# Patient Record
Sex: Female | Born: 1935
Health system: Southern US, Community
[De-identification: ages and names within clinical notes are randomized; demographics above are authoritative.]

## PROBLEM LIST (undated history)

## (undated) DIAGNOSIS — R630 Anorexia: Secondary | ICD-10-CM

## (undated) DIAGNOSIS — I10 Essential (primary) hypertension: Secondary | ICD-10-CM

## (undated) DIAGNOSIS — J309 Allergic rhinitis, unspecified: Secondary | ICD-10-CM

## (undated) DIAGNOSIS — I951 Orthostatic hypotension: Secondary | ICD-10-CM

## (undated) DIAGNOSIS — F0150 Vascular dementia without behavioral disturbance: Secondary | ICD-10-CM

## (undated) DIAGNOSIS — N183 Chronic kidney disease, stage 3 (moderate): Principal | ICD-10-CM

## (undated) DIAGNOSIS — Z90710 Acquired absence of both cervix and uterus: Secondary | ICD-10-CM

## (undated) DIAGNOSIS — R413 Other amnesia: Secondary | ICD-10-CM

## (undated) DIAGNOSIS — R569 Unspecified convulsions: Secondary | ICD-10-CM

## (undated) DIAGNOSIS — R269 Unspecified abnormalities of gait and mobility: Secondary | ICD-10-CM

## (undated) DIAGNOSIS — R7302 Impaired glucose tolerance (oral): Secondary | ICD-10-CM

## (undated) DIAGNOSIS — M25551 Pain in right hip: Secondary | ICD-10-CM

## (undated) DIAGNOSIS — K5909 Other constipation: Secondary | ICD-10-CM

## (undated) DIAGNOSIS — M810 Age-related osteoporosis without current pathological fracture: Secondary | ICD-10-CM

## (undated) DIAGNOSIS — E785 Hyperlipidemia, unspecified: Secondary | ICD-10-CM

## (undated) DIAGNOSIS — L309 Dermatitis, unspecified: Secondary | ICD-10-CM

## (undated) DIAGNOSIS — E559 Vitamin D deficiency, unspecified: Secondary | ICD-10-CM

## (undated) DIAGNOSIS — Z8541 Personal history of malignant neoplasm of cervix uteri: Secondary | ICD-10-CM

## (undated) HISTORY — DX: Chronic kidney disease, stage 3 (moderate): N18.3

## (undated) HISTORY — DX: Other constipation: K59.09

## (undated) HISTORY — DX: Other amnesia: R41.3

## (undated) HISTORY — DX: Personal history of malignant neoplasm of cervix uteri: Z85.41

## (undated) HISTORY — DX: Unspecified abnormalities of gait and mobility: R26.9

## (undated) HISTORY — DX: Vascular dementia, unspecified severity, without behavioral disturbance, psychotic disturbance, mood disturbance, and anxiety: F01.50

## (undated) HISTORY — DX: Essential (primary) hypertension: I10

## (undated) HISTORY — DX: Pain in right hip: M25.551

## (undated) HISTORY — DX: Impaired glucose tolerance (oral): R73.02

## (undated) HISTORY — DX: Age-related osteoporosis without current pathological fracture: M81.0

## (undated) HISTORY — DX: Anorexia: R63.0

## (undated) HISTORY — DX: Orthostatic hypotension: I95.1

## (undated) HISTORY — PX: EYE SURGERY: SHX253

## (undated) HISTORY — DX: Hyperlipidemia, unspecified: E78.5

## (undated) HISTORY — DX: Unspecified convulsions: R56.9

## (undated) HISTORY — DX: Allergic rhinitis, unspecified: J30.9

## (undated) HISTORY — DX: Vitamin D deficiency, unspecified: E55.9

## (undated) HISTORY — DX: Dermatitis, unspecified: L30.9

## (undated) HISTORY — DX: Acquired absence of both cervix and uterus: Z90.710

---

## 1968-07-12 HISTORY — PX: ABDOMINAL HYSTERECTOMY: SHX81

## 1979-03-13 HISTORY — PX: BREAST BIOPSY: SHX20

## 2001-01-27 ENCOUNTER — Other Ambulatory Visit: Admission: RE | Admit: 2001-01-27 | Discharge: 2001-01-27 | Payer: Self-pay | Admitting: *Deleted

## 2002-07-24 ENCOUNTER — Other Ambulatory Visit: Admission: RE | Admit: 2002-07-24 | Discharge: 2002-07-24 | Payer: Self-pay | Admitting: *Deleted

## 2002-11-15 ENCOUNTER — Emergency Department (HOSPITAL_COMMUNITY): Admission: EM | Admit: 2002-11-15 | Discharge: 2002-11-16 | Payer: Self-pay | Admitting: *Deleted

## 2002-11-15 ENCOUNTER — Encounter: Payer: Self-pay | Admitting: Emergency Medicine

## 2003-12-14 ENCOUNTER — Ambulatory Visit (HOSPITAL_COMMUNITY): Admission: RE | Admit: 2003-12-14 | Discharge: 2003-12-14 | Payer: Self-pay | Admitting: Internal Medicine

## 2004-12-28 ENCOUNTER — Ambulatory Visit: Payer: Self-pay | Admitting: Internal Medicine

## 2006-05-25 ENCOUNTER — Ambulatory Visit: Payer: Self-pay | Admitting: Internal Medicine

## 2006-06-03 ENCOUNTER — Encounter: Admission: RE | Admit: 2006-06-03 | Discharge: 2006-06-03 | Payer: Self-pay | Admitting: Internal Medicine

## 2010-10-29 ENCOUNTER — Ambulatory Visit (INDEPENDENT_AMBULATORY_CARE_PROVIDER_SITE_OTHER): Payer: Medicare PPO | Admitting: Internal Medicine

## 2010-10-29 ENCOUNTER — Encounter: Payer: Self-pay | Admitting: Internal Medicine

## 2010-10-29 ENCOUNTER — Telehealth: Payer: Self-pay | Admitting: Internal Medicine

## 2010-10-29 ENCOUNTER — Other Ambulatory Visit (INDEPENDENT_AMBULATORY_CARE_PROVIDER_SITE_OTHER): Payer: Medicare PPO

## 2010-10-29 VITALS — BP 148/82 | HR 77 | Temp 97.6°F | Ht 63.0 in | Wt 149.5 lb

## 2010-10-29 DIAGNOSIS — K5900 Constipation, unspecified: Secondary | ICD-10-CM

## 2010-10-29 DIAGNOSIS — J309 Allergic rhinitis, unspecified: Secondary | ICD-10-CM

## 2010-10-29 DIAGNOSIS — Z8541 Personal history of malignant neoplasm of cervix uteri: Secondary | ICD-10-CM | POA: Insufficient documentation

## 2010-10-29 DIAGNOSIS — I10 Essential (primary) hypertension: Secondary | ICD-10-CM

## 2010-10-29 DIAGNOSIS — Z0000 Encounter for general adult medical examination without abnormal findings: Secondary | ICD-10-CM

## 2010-10-29 DIAGNOSIS — K5909 Other constipation: Secondary | ICD-10-CM

## 2010-10-29 DIAGNOSIS — Z90710 Acquired absence of both cervix and uterus: Secondary | ICD-10-CM

## 2010-10-29 DIAGNOSIS — L309 Dermatitis, unspecified: Secondary | ICD-10-CM

## 2010-10-29 DIAGNOSIS — Z23 Encounter for immunization: Secondary | ICD-10-CM

## 2010-10-29 HISTORY — DX: Other constipation: K59.09

## 2010-10-29 HISTORY — DX: Allergic rhinitis, unspecified: J30.9

## 2010-10-29 HISTORY — DX: Personal history of malignant neoplasm of cervix uteri: Z85.41

## 2010-10-29 HISTORY — DX: Dermatitis, unspecified: L30.9

## 2010-10-29 HISTORY — DX: Essential (primary) hypertension: I10

## 2010-10-29 HISTORY — DX: Acquired absence of both cervix and uterus: Z90.710

## 2010-10-29 LAB — HEPATIC FUNCTION PANEL
ALT: 25 U/L (ref 0–35)
Alkaline Phosphatase: 86 U/L (ref 39–117)
Bilirubin, Direct: 0.1 mg/dL (ref 0.0–0.3)
Total Protein: 7.7 g/dL (ref 6.0–8.3)

## 2010-10-29 LAB — URINALYSIS, ROUTINE W REFLEX MICROSCOPIC
Bilirubin Urine: NEGATIVE
Urine Glucose: NEGATIVE
Urobilinogen, UA: 0.2 (ref 0.0–1.0)

## 2010-10-29 LAB — CBC WITH DIFFERENTIAL/PLATELET
Basophils Absolute: 0 10*3/uL (ref 0.0–0.1)
Eosinophils Absolute: 0.1 10*3/uL (ref 0.0–0.7)
Lymphocytes Relative: 40.4 % (ref 12.0–46.0)
MCHC: 34.8 g/dL (ref 30.0–36.0)
Monocytes Absolute: 0.3 10*3/uL (ref 0.1–1.0)
Neutro Abs: 2.8 10*3/uL (ref 1.4–7.7)
Neutrophils Relative %: 52.1 % (ref 43.0–77.0)
RDW: 12.6 % (ref 11.5–14.6)

## 2010-10-29 LAB — BASIC METABOLIC PANEL
CO2: 32 mEq/L (ref 19–32)
Chloride: 100 mEq/L (ref 96–112)
Potassium: 3.2 mEq/L — ABNORMAL LOW (ref 3.5–5.1)
Sodium: 140 mEq/L (ref 135–145)

## 2010-10-29 LAB — LIPID PANEL: Total CHOL/HDL Ratio: 2

## 2010-10-29 MED ORDER — PNEUMOCOCCAL VAC POLYVALENT 25 MCG/0.5ML IJ INJ: 0.5000 mL | INJECTION | Freq: Once | INTRAMUSCULAR | Status: DC

## 2010-10-29 MED ORDER — LISINOPRIL-HYDROCHLOROTHIAZIDE 20-12.5 MG PO TABS: 1.0000 | ORAL_TABLET | Freq: Every day | ORAL | Status: DC

## 2010-10-29 MED ORDER — HYDROCHLOROTHIAZIDE 25 MG PO TABS: 25.0000 mg | ORAL_TABLET | Freq: Every day | ORAL | Status: DC

## 2010-10-29 MED ORDER — AMLODIPINE BESYLATE 10 MG PO TABS: 10.0000 mg | ORAL_TABLET | Freq: Every day | ORAL | Status: DC

## 2010-10-29 MED ORDER — POTASSIUM CHLORIDE 10 MEQ PO TBCR: 10.0000 meq | EXTENDED_RELEASE_TABLET | Freq: Every day | ORAL | Status: DC

## 2010-10-29 MED ORDER — LORATADINE 10 MG PO TABS: 10.0000 mg | ORAL_TABLET | Freq: Every day | ORAL | Status: DC

## 2010-10-29 MED ORDER — ATORVASTATIN CALCIUM 40 MG PO TABS: 40.0000 mg | ORAL_TABLET | Freq: Every day | ORAL | Status: DC

## 2010-10-29 NOTE — Assessment & Plan Note (Signed)
Overall doing well, age appropriate education and counseling updated, referrals for preventative services and immunizations addressed, dietary and smoking counseling addressed, most recent labs and ECG reviewed.  I have personally reviewed and have noted: 1) the patient's medical and social history 2) The pt's use of alcohol, tobacco, and illicit drugs 3) The patient's current medications and supplements 4) Functional ability including ADL's, fall risk, home safety risk, hearing and visual impairment 5) Diet and physical activities 6) Evidence for depression or mood disorder 7) The patient's height, weight, and BMI have been recorded in the chart I have made referrals, and provided counseling and education based on review of the above For pneumonia shot today

## 2010-10-29 NOTE — Progress Notes (Signed)
Subjective:    Patient ID: Teresa Riley, female    DOB: [birth year], 76 y.o.   MRN: [id number]  HPI  Here for wellness and f/u;  Overall doing ok;  Pt denies CP, worsening SOB, DOE, wheezing, orthopnea, PND, worsening LE edema, palpitations, dizziness or syncope.  Pt denies neurological change such as new Headache, facial or extremity weakness.  Pt denies polydipsia, polyuria, or low sugar symptoms. Pt states overall good compliance with treatment and medications, good tolerability, and trying to follow lower cholesterol diet.  Pt denies worsening depressive symptoms, suicidal ideation or panic. No fever, wt loss, night sweats, loss of appetite, or other constitutional symptoms.  Pt states good ability with ADL's, low fall risk, home safety reviewed and adequate, no significant changes in hearing or vision, and occasionally active with exercise.  Has ongoing recurring constipation for over a yr, due for colonscopy but declines.  BP at home < 140/90 Past Medical History  Diagnosis Date  . Hypertension   . HTN (hypertension) 10/29/2010  . H/O: hysterectomy 10/29/2010  . Chronic constipation 10/29/2010  . Eczema 10/29/2010  . History of cervical cancer 10/29/2010   Past Surgical History  Procedure Date  . Abdominal hysterectomy 1970    reports that she has quit smoking. She does not have any smokeless tobacco history on file. She reports that she does not drink alcohol or use illicit drugs. family history includes Hypertension in her father and mother. No Known Allergies No current outpatient prescriptions on file prior to visit.   No current facility-administered medications on file prior to visit.   Review of Systems Review of Systems  Constitutional: Negative for diaphoresis, activity change, appetite change and unexpected weight change.  HENT: Negative for hearing loss, ear pain, facial swelling, mouth sores and neck stiffness.   Eyes: Negative for pain, redness and visual disturbance.    Respiratory: Negative for shortness of breath and wheezing.   Cardiovascular: Negative for chest pain and palpitations.  Gastrointestinal: Negative for diarrhea, blood in stool, abdominal distention and rectal pain.  Genitourinary: Negative for hematuria, flank pain and decreased urine volume.  Musculoskeletal: Negative for myalgias and joint swelling.  Skin: Negative for color change and wound.  Neurological: Negative for syncope and numbness.  Hematological: Negative for adenopathy.  Psychiatric/Behavioral: Negative for hallucinations, self-injury, decreased concentration and agitation.      Objective:   Physical Exam BP 148/82  Pulse 77  Temp(Src) 97.6 F (36.4 C) (Oral)  Ht 5\' 3"  (1.6 m)  Wt 149 lb 8 oz (67.813 kg)  BMI 26.48 kg/m2  SpO2 96% Physical Exam  VS noted Constitutional: Pt is oriented to person, place, and time. Appears well-developed and well-nourished.  HENT:  Head: Normocephalic and atraumatic.  Right Ear: External ear normal.  Left Ear: External ear normal.  Nose: Nose normal.  Mouth/Throat: Oropharynx is clear and moist.  Eyes: Conjunctivae and EOM are normal. Pupils are equal, round, and reactive to light.  Neck: Normal range of motion. Neck supple. No JVD present. No tracheal deviation present.  Cardiovascular: Normal rate, regular rhythm, normal heart sounds and intact distal pulses.   Pulmonary/Chest: Effort normal and breath sounds normal.  Abdominal: Soft. Bowel sounds are normal. There is no tenderness.  Musculoskeletal: Normal range of motion. Exhibits no edema.  Lymphadenopathy:  Has no cervical adenopathy.  Neurological: Pt is alert and oriented to person, place, and time. Pt has normal reflexes. No cranial nerve deficit.  Skin: Skin is warm and dry. No rash noted.  Psychiatric:  Has  normal mood and affect. Behavior is normal. 1+ nervous       Assessment & Plan:

## 2010-10-29 NOTE — Telephone Encounter (Signed)
Pt with mild low K on lab results, likely due to diuretic  Pt to start klor con 10 qd  To robin  - to notify pt, I sent med

## 2010-10-29 NOTE — Patient Instructions (Addendum)
Please try Miralax OTC as needed for constipation or senakot as needed Please go to LAB in the Basement for the blood and/or urine tests to be done today Please call the number on the Blue Card (the PhoneTree System) for results of testing in 2-3 days Please call if you would like to be referred for the colonoscopy Please remember to followup with your GYN for the yearly pap smear and/or mammogram  - please consider [facility] on [address], or [facility] on [address] had the pneumonia shot today All of your refills will be sent to the pharmacy Please return in 1 year for your yearly visit, or sooner if needed, with Lab testing done 3-5 days before

## 2010-10-29 NOTE — Assessment & Plan Note (Signed)
Mild, for miralax OTC prn to start,  Or senakot prn

## 2010-10-30 NOTE — Telephone Encounter (Signed)
Called patient informed of lab results and new prescription sent  To pharmacy.

## 2010-11-02 ENCOUNTER — Telehealth: Payer: Self-pay

## 2010-11-02 MED ORDER — TRIAMCINOLONE ACETONIDE 0.025 % EX LOTN: 0.0250 "application " | TOPICAL_LOTION | Freq: Three times a day (TID) | CUTANEOUS | Status: DC

## 2010-11-02 MED ORDER — POTASSIUM CHLORIDE 10 MEQ PO TBCR: 10.0000 meq | EXTENDED_RELEASE_TABLET | Freq: Every day | ORAL | Status: DC

## 2010-11-02 MED ORDER — ATORVASTATIN CALCIUM 40 MG PO TABS: 40.0000 mg | ORAL_TABLET | Freq: Every day | ORAL | Status: DC

## 2010-11-02 MED ORDER — HYDROCHLOROTHIAZIDE 25 MG PO TABS: 25.0000 mg | ORAL_TABLET | Freq: Every day | ORAL | Status: DC

## 2010-11-02 MED ORDER — LISINOPRIL-HYDROCHLOROTHIAZIDE 20-12.5 MG PO TABS: 1.0000 | ORAL_TABLET | Freq: Every day | ORAL | Status: DC

## 2010-11-02 MED ORDER — AMLODIPINE BESYLATE 10 MG PO TABS: 10.0000 mg | ORAL_TABLET | Freq: Every day | ORAL | Status: DC

## 2010-11-02 MED ORDER — LORATADINE 10 MG PO TABS: 10.0000 mg | ORAL_TABLET | Freq: Every day | ORAL | Status: DC

## 2010-11-02 NOTE — Telephone Encounter (Signed)
Sent prescription requested to [facility] [address].

## 2010-11-02 NOTE — Telephone Encounter (Signed)
To robin   

## 2010-11-02 NOTE — Telephone Encounter (Signed)
[facility] Triage Call Report Triage Record Num: [id number] Operator: Frederico Hamman Patient Name: Teresa Riley Call Date & Time: 10/31/2010 1:23:56PM Patient Phone: [phone] PCP: Oliver Barre Patient Gender: Female PCP Fax : [phone] Patient DOB: 1934/08/30 Practice Name: [facility] Reason for Call: [name] states she was seen in office on 4/18 . Prescriptions for Lisinopril, Amlodipine , HCTZ, Atorvastatin, Loratidine, Potassium and new skin med were to be called in to [facility] [phone]. [name] states no prescriptions several meds were to be called in to pharmacy. [facility] confirms meds not received. [name] states she has enough meds to get through weekend. Advised to notify office on 4/23/AM. Care advice given. office note Protocol(s) Used: Office Note Recommended Outcome per Protocol: Information Noted and Sent to Office Reason for Outcome: Caller information to office Care Advice: ~ 10/31/2010 1:40:54PM Page 1 of 1 [email or web address]

## 2010-11-08 ENCOUNTER — Encounter: Payer: Self-pay | Admitting: Internal Medicine

## 2010-11-08 NOTE — Assessment & Plan Note (Signed)
stable overall by hx and exam, most recent lab reviewed with pt, and pt to continue medical treatment as before  BP Readings from Last 3 Encounters:  10/29/10 148/82

## 2010-11-10 ENCOUNTER — Encounter: Payer: Self-pay | Admitting: Internal Medicine

## 2010-12-01 ENCOUNTER — Ambulatory Visit: Payer: Medicare PPO

## 2011-01-10 HISTORY — PX: CATARACT EXTRACTION W/ INTRAOCULAR LENS  IMPLANT, BILATERAL: SHX1307

## 2011-01-15 ENCOUNTER — Ambulatory Visit: Payer: Medicare PPO | Admitting: Internal Medicine

## 2011-01-19 ENCOUNTER — Encounter: Payer: Self-pay | Admitting: Internal Medicine

## 2011-01-19 ENCOUNTER — Ambulatory Visit (INDEPENDENT_AMBULATORY_CARE_PROVIDER_SITE_OTHER): Payer: Medicare PPO | Admitting: Internal Medicine

## 2011-01-19 VITALS — BP 142/86 | HR 73 | Temp 99.0°F | Ht 60.0 in | Wt 148.2 lb

## 2011-01-19 DIAGNOSIS — I10 Essential (primary) hypertension: Secondary | ICD-10-CM

## 2011-01-19 DIAGNOSIS — Z111 Encounter for screening for respiratory tuberculosis: Secondary | ICD-10-CM

## 2011-01-19 DIAGNOSIS — Z0289 Encounter for other administrative examinations: Secondary | ICD-10-CM

## 2011-01-19 DIAGNOSIS — J309 Allergic rhinitis, unspecified: Secondary | ICD-10-CM

## 2011-01-19 DIAGNOSIS — Z0282 Encounter for adoption services: Secondary | ICD-10-CM

## 2011-01-19 NOTE — Assessment & Plan Note (Addendum)
Overall doing well, age appropriate education and counseling updated, referrals for preventative services and immunizations addressed, dietary and smoking counseling addressed, most recent labs  reviewed.  I have personally reviewed and have noted: 1) the patient's medical and social history 2) The pt's use of alcohol, tobacco, and illicit drugs 3) The patient's current medications and supplements 4) Functional ability including ADL's, fall risk, home safety risk, hearing and visual impairment 5) Diet and physical activities 6) Evidence for depression or mood disorder 7) The patient's height, weight, and BMI have been recorded in the chart I have made referrals, and provided counseling and education based on review of the above Pt is able to qualify to become foster parent. PPD placed today, f/u 48-72 hrs.

## 2011-01-19 NOTE — Patient Instructions (Addendum)
Please return in 48-72 hours to have the TB skin test read by the nurse Your form is partially filled out for the [facility] Parent application The form can be completed after the TB skin test is read Continue all other medications as before

## 2011-01-19 NOTE — Assessment & Plan Note (Signed)
stable overall by hx and exam, most recent data reviewed with pt, and pt to continue medical treatment as before  BP Readings from Last 3 Encounters:  01/19/11 142/86  10/29/10 148/82

## 2011-01-19 NOTE — Assessment & Plan Note (Signed)
stable overall by hx and exam, most recent data reviewed with pt, and pt to continue medical treatment as before  For allegra otc prn

## 2011-01-19 NOTE — Progress Notes (Signed)
Subjective:    Patient ID: Teresa Riley, female    DOB: [birth year], 76 y.o.   MRN: [id number]  HPI  Here to f/u;  Needs PPD and form filled out in order to become foster parent; Pt denies chest pain, increased sob or doe, wheezing, orthopnea, PND, increased LE swelling, palpitations, dizziness or syncope.  Pt denies new neurological symptoms such as new headache, or facial or extremity weakness or numbness   Pt denies polydipsia, polyuria, .  Pt states overall good compliance with meds, trying to follow lower cholesterol diet, wt overall stable but little exercise however.   Does have several wks ongoing nasal allergy symptoms with clear congestion, itch and sneeze, without fever, pain, ST, cough or wheezing.  Overall good compliance with treatment, and good medicine tolerability.   Pt denies fever, wt loss, night sweats, loss of appetite, or other constitutional symptoms.  Denies worsening depressive symptoms, suicidal ideation, or panic. Past Medical History  Diagnosis Date  . Hypertension   . HTN (hypertension) 10/29/2010  . H/O: hysterectomy 10/29/2010  . Chronic constipation 10/29/2010  . Eczema 10/29/2010  . History of cervical cancer 10/29/2010  . Allergic rhinitis, cause unspecified 10/29/2010   Past Surgical History  Procedure Date  . Abdominal hysterectomy 1970    reports that she has quit smoking. She does not have any smokeless tobacco history on file. She reports that she does not drink alcohol or use illicit drugs. family history includes Hypertension in her father and mother. No Known Allergies Current Outpatient Prescriptions on File Prior to Visit  Medication Sig Dispense Refill  . amLODipine (NORVASC) 10 MG tablet Take 1 tablet (10 mg total) by mouth daily.  90 tablet  3  . atorvastatin (LIPITOR) 40 MG tablet Take 1 tablet (40 mg total) by mouth at bedtime.  90 tablet  3  . hydrochlorothiazide 25 MG tablet Take 1 tablet (25 mg total) by mouth daily.  90 tablet  3  .  lisinopril-hydrochlorothiazide (PRINZIDE,ZESTORETIC) 20-12.5 MG per tablet Take 1 tablet by mouth daily.  90 tablet  3  . Triamcinolone Acetonide 0.025 % LOTN Apply 0.025 application topically 3 (three) times daily. Apply three times per day for Eczema  1 Bottle  11  . potassium chloride (KLOR-CON 10) 10 MEQ CR tablet Take 1 tablet (10 mEq total) by mouth daily.  30 tablet  11  . DISCONTD: camphor-menthol (SARNA) lotion Apply topically. Use up to 6 times per day       . DISCONTD: loratadine (CLARITIN) 10 MG tablet Take 1 tablet (10 mg total) by mouth daily.  90 tablet  3   Current Facility-Administered Medications on File Prior to Visit  Medication Dose Route Frequency Provider Last Rate Last Dose  . pneumococcal 23 valent vaccine (PNU-IMMUNE) injection 0.5 mL  0.5 mL Intramuscular Once Oliver Barre, MD       Review of Systems Review of Systems  Constitutional: Negative for diaphoresis and unexpected weight change.  HENT: Negative for drooling and tinnitus.   Eyes: Negative for photophobia and visual disturbance.  Respiratory: Negative for choking and stridor.   Gastrointestinal: Negative for vomiting and blood in stool.  Genitourinary: Negative for hematuria and decreased urine volume.  Musculoskeletal: Negative for gait problem.  Skin: Negative for color change and wound.  Neurological: Negative for tremors and numbness.  Psychiatric/Behavioral: Negative for decreased concentration. The patient is not hyperactive.       Objective:   Physical Exam BP 142/86  Pulse 73  Temp(Src)  99 F (37.2 C) (Oral)  Ht 5' (1.524 m)  Wt 148 lb 4 oz (67.246 kg)  BMI 28.95 kg/m2  SpO2 96% Physical Exam  VS noted Constitutional: Pt appears well-developed and well-nourished.  HENT: Head: Normocephalic.  Right Ear: External ear normal.  Left Ear: External ear normal.  Eyes: Conjunctivae and EOM are normal. Pupils are equal, round, and reactive to light.  Neck: Normal range of motion. Neck supple.    Cardiovascular: Normal rate and regular rhythm.   Pulmonary/Chest: Effort normal and breath sounds normal.  Abd:  Soft, NT, non-distended, + BS Neurological: Pt is alert. No cranial nerve deficit.  Skin: Skin is warm. No erythema.  Psychiatric: Pt behavior is normal. Thought content normal.         Assessment & Plan:

## 2011-01-20 ENCOUNTER — Ambulatory Visit: Payer: Medicare PPO | Admitting: Internal Medicine

## 2011-01-21 LAB — TB SKIN TEST

## 2011-06-11 ENCOUNTER — Other Ambulatory Visit (INDEPENDENT_AMBULATORY_CARE_PROVIDER_SITE_OTHER): Payer: Medicare PPO

## 2011-06-11 ENCOUNTER — Ambulatory Visit (INDEPENDENT_AMBULATORY_CARE_PROVIDER_SITE_OTHER)
Admission: RE | Admit: 2011-06-11 | Discharge: 2011-06-11 | Disposition: A | Discharge status: Home / Self Care | Payer: Medicare PPO | Source: Ambulatory Visit | Attending: Internal Medicine | Admitting: Internal Medicine

## 2011-06-11 ENCOUNTER — Ambulatory Visit (INDEPENDENT_AMBULATORY_CARE_PROVIDER_SITE_OTHER): Payer: Medicare PPO | Admitting: Internal Medicine

## 2011-06-11 ENCOUNTER — Encounter: Payer: Self-pay | Admitting: Internal Medicine

## 2011-06-11 VITALS — BP 142/92 | HR 77 | Temp 98.8°F | Ht 63.5 in | Wt 149.2 lb

## 2011-06-11 DIAGNOSIS — R55 Syncope and collapse: Secondary | ICD-10-CM

## 2011-06-11 DIAGNOSIS — I10 Essential (primary) hypertension: Secondary | ICD-10-CM

## 2011-06-11 LAB — BASIC METABOLIC PANEL
BUN: 12 mg/dL (ref 6–23)
Chloride: 98 mEq/L (ref 96–112)
Creatinine, Ser: 0.9 mg/dL (ref 0.4–1.2)
Glucose, Bld: 109 mg/dL — ABNORMAL HIGH (ref 70–99)
Potassium: 3.4 mEq/L — ABNORMAL LOW (ref 3.5–5.1)

## 2011-06-11 LAB — CBC WITH DIFFERENTIAL/PLATELET
Basophils Relative: 1.4 % (ref 0.0–3.0)
Eosinophils Relative: 0.2 % (ref 0.0–5.0)
Lymphocytes Relative: 29.5 % (ref 12.0–46.0)
Monocytes Absolute: 0.2 10*3/uL (ref 0.1–1.0)
Monocytes Relative: 4.1 % (ref 3.0–12.0)
Neutrophils Relative %: 64.8 % (ref 43.0–77.0)
Platelets: 264 10*3/uL (ref 150.0–400.0)
RBC: 4.16 Mil/uL (ref 3.87–5.11)
WBC: 5.8 10*3/uL (ref 4.5–10.5)

## 2011-06-11 LAB — URINALYSIS, ROUTINE W REFLEX MICROSCOPIC
Nitrite: NEGATIVE
Specific Gravity, Urine: 1.01 (ref 1.000–1.030)
Total Protein, Urine: NEGATIVE
pH: 7 (ref 5.0–8.0)

## 2011-06-11 LAB — HEPATIC FUNCTION PANEL
ALT: 16 U/L (ref 0–35)
AST: 35 U/L (ref 0–37)
Albumin: 4.2 g/dL (ref 3.5–5.2)
Total Protein: 8.2 g/dL (ref 6.0–8.3)

## 2011-06-11 MED ORDER — LISINOPRIL 20 MG PO TABS: 20.0000 mg | ORAL_TABLET | Freq: Every day | ORAL | Status: DC

## 2011-06-11 NOTE — Progress Notes (Signed)
Subjective:    Patient ID: Teresa Riley, female    DOB: May 02, 1935, 76 y.o.   MRN: [id number]  HPI  Here to f/u;  Here with one daughter, but was at a second daughters house in [place], had been going up and down the stairs in the house about 8 times for approx 30 stairs each way (did this about 3 times more than usual, and holding light wts in the arms at the same time);  Was finished and was standing in the laundry room putting clothes in the top part of washer without bending at the wiast - dirty linens were at waist level;  Became lightheaded, fuzzy and confused, thought it would pass, did not try to sit and passed out;  Pt denies chest pain, increased sob or doe, wheezing, orthopnea, PND, increased LE swelling, palpitations.   Pt denies new neurological symptoms such as new headache, or facial or extremity weakness or numbness.  Pt denies polydipsia, polyuria. Family heard her hit the floor and was with her within less than one minute and noted pt tyring to get back up, became dizzy and weak and has seocnd episode of syncope - out for approx 5 min. Family called EMS, noted eyes open with some slight twitching of the hands and legs, mouth slightly dropped open, but overt sz activity. "came to" after about 5 min and EMS did onsite EKG (pt brings with her) but o/w refused to go ER.  CAme here instead, still somewhat fatigue, tired, drained, weak, still mild confused more than usual per duaghter with her today. Pt denies HA now, but first fall not witnessed.   Pt denies fever, wt loss, night sweats, loss of appetite, or other constitutional symptoms.   Denies urinary symptoms such as dysuria, frequency, urgency,or hematuria.   Past Medical History  Diagnosis Date  . Hypertension   . HTN (hypertension) 10/29/2010  . H/O: hysterectomy 10/29/2010  . Chronic constipation 10/29/2010  . Eczema 10/29/2010  . History of cervical cancer 10/29/2010  . Allergic rhinitis, cause unspecified 10/29/2010   Past  Surgical History  Procedure Date  . Abdominal hysterectomy 1970    reports that she has quit smoking. She does not have any smokeless tobacco history on file. She reports that she does not drink alcohol or use illicit drugs. family history includes Hypertension in her father and mother. No Known Allergies Current Outpatient Prescriptions on File Prior to Visit  Medication Sig Dispense Refill  . amLODipine (NORVASC) 10 MG tablet Take 1 tablet (10 mg total) by mouth daily.  90 tablet  3  . atorvastatin (LIPITOR) 40 MG tablet Take 1 tablet (40 mg total) by mouth at bedtime.  90 tablet  3  . potassium chloride (KLOR-CON 10) 10 MEQ CR tablet Take 1 tablet (10 mEq total) by mouth daily.  30 tablet  11   Current Facility-Administered Medications on File Prior to Visit  Medication Dose Route Frequency Provider Last Rate Last Dose  . pneumococcal 23 valent vaccine (PNU-IMMUNE) injection 0.5 mL  0.5 mL Intramuscular Once Oliver Barre, MD       Review of Systems Theodosia Paling of Systems  Constitutional: Negative for diaphoresis and unexpected weight change.  HENT: Negative for drooling and tinnitus.   Eyes: Negative for photophobia and visual disturbance.  Respiratory: Negative for choking and stridor.   Gastrointestinal: Negative for vomiting and blood in stool.  Genitourinary: Negative for hematuria and decreased urine volume.  Musculoskeletal: Negative for gait problem.  Skin: Negative  for color change and wound.  Neurological: Negative for tremors and numbness.  Psychiatric/Behavioral: Negative for decreased concentration. The patient is not hyperactive.       Objective:   Physical Exam BP 142/92  Pulse 77  Temp(Src) 98.8 F (37.1 C) (Oral)  Ht 5' 3.5" (1.613 m)  Wt 149 lb 4 oz (67.699 kg)  BMI 26.02 kg/m2  SpO2 96% Physical Exam  VS noted Constitutional: Pt appears well-developed and well-nourished.  HENT: Head: Normocephalic.  Right Ear: External ear normal.  Left Ear: External ear  normal.  Eyes: Conjunctivae and EOM are normal. Pupils are equal, round, and reactive to light.  Neck: Normal range of motion. Neck supple.  Cardiovascular: Normal rate and regular rhythm.   Pulmonary/Chest: Effort normal and breath sounds normal.  Abd:  Soft, NT, non-distended, + BS Neurological: Pt is alert. No cranial nerve deficit. minor cognitive slowing, confusion noted, motor/sens/dtr/gait intact Skin: Skin is warm. No erythema.  Psychiatric: Pt behavior is normal. Thought content normal.     Assessment & Plan:

## 2011-06-11 NOTE — Assessment & Plan Note (Signed)
To d/c HCT, to cont lisinopril only,  to f/u any worsening symptoms or concerns with orthostatics next visit

## 2011-06-11 NOTE — Patient Instructions (Addendum)
OK to stop the hydrochlorothiazide 25 mg OK to stop the lisinopril-HCT 20-12.5 mg Please start lisinopril 20 mg per day OK to drink a bit more fluids in the next 2-3 days Continue all other medications as before Please go to XRAY in the Basement for the x-ray test Please go to LAB in the Basement for the blood and/or urine tests to be done today Please call the phone number [phone] (the PhoneTree System) for results of testing in 2-3 days;  When calling, simply dial the number, and when prompted enter the MRN number above (the Medical Record Number) and the # key, then the message should start. You will be contacted regarding the referral for: echocardiogram, and carotid dopplers You will be contacted regarding the referral for: CT Head - to see PCC's now to schedule hopefully today

## 2011-06-11 NOTE — Assessment & Plan Note (Signed)
Most likely due to mild orthostasis ongoing with overdiuresis, then overexertion today; cant r/o injury to head with mild confusion though pt denies HA;  For ct head - r/o bleed;  Also for labs including cpk/mb/labs/cxr/carotid dopplers and echo (many yrs since last);  Consider card eval for abnormality;  Pt adamant she does not want to go to ER or hospital admit

## 2011-06-12 LAB — CK TOTAL AND CKMB (NOT AT ARMC)
Relative Index: 1.7 (ref 0.0–2.5)
Total CK: 194 U/L — ABNORMAL HIGH (ref 7–177)

## 2011-06-14 ENCOUNTER — Inpatient Hospital Stay: Admission: RE | Admit: 2011-06-14 | Payer: Medicare PPO | Source: Ambulatory Visit

## 2011-06-15 ENCOUNTER — Other Ambulatory Visit: Payer: Self-pay

## 2011-06-15 ENCOUNTER — Other Ambulatory Visit: Payer: Self-pay | Admitting: Cardiology

## 2011-06-15 DIAGNOSIS — R55 Syncope and collapse: Secondary | ICD-10-CM

## 2011-06-15 MED ORDER — LISINOPRIL 20 MG PO TABS: 20.0000 mg | ORAL_TABLET | Freq: Every day | ORAL | Status: DC

## 2011-06-15 NOTE — Telephone Encounter (Signed)
Patient called to informed she never received Lisinopril sent in on 06/11/2011. Called gave verbal ok to fill lisinopril as was on hold at pharmacy. Also per request by [phone]

## 2011-06-16 ENCOUNTER — Ambulatory Visit (HOSPITAL_COMMUNITY): Payer: Medicare PPO | Attending: Internal Medicine | Admitting: Radiology

## 2011-06-16 ENCOUNTER — Encounter (INDEPENDENT_AMBULATORY_CARE_PROVIDER_SITE_OTHER): Payer: Medicare PPO | Admitting: Cardiology

## 2011-06-16 DIAGNOSIS — R55 Syncope and collapse: Secondary | ICD-10-CM

## 2011-06-16 DIAGNOSIS — I079 Rheumatic tricuspid valve disease, unspecified: Secondary | ICD-10-CM | POA: Insufficient documentation

## 2011-06-16 DIAGNOSIS — I10 Essential (primary) hypertension: Secondary | ICD-10-CM | POA: Insufficient documentation

## 2011-06-25 ENCOUNTER — Encounter: Payer: Self-pay | Admitting: Internal Medicine

## 2011-06-25 ENCOUNTER — Ambulatory Visit (INDEPENDENT_AMBULATORY_CARE_PROVIDER_SITE_OTHER): Payer: Medicare PPO | Admitting: Internal Medicine

## 2011-06-25 VITALS — BP 146/90 | HR 84 | Temp 97.6°F | Ht 62.0 in | Wt 148.0 lb

## 2011-06-25 DIAGNOSIS — R55 Syncope and collapse: Secondary | ICD-10-CM

## 2011-06-25 DIAGNOSIS — I10 Essential (primary) hypertension: Secondary | ICD-10-CM

## 2011-06-25 DIAGNOSIS — Z23 Encounter for immunization: Secondary | ICD-10-CM

## 2011-06-25 DIAGNOSIS — Z0000 Encounter for general adult medical examination without abnormal findings: Secondary | ICD-10-CM

## 2011-06-25 NOTE — Patient Instructions (Signed)
You had the flu shot today Continue all other medications as before Please continue to monitor your Blood Pressure at home; your goal is to be less than 140/90 Please return in 1 year for your yearly visit, or sooner if needed, with Lab testing done 3-5 days before

## 2011-06-26 ENCOUNTER — Encounter: Payer: Self-pay | Admitting: Internal Medicine

## 2011-06-26 NOTE — Assessment & Plan Note (Signed)
Labs/ck/echo/carotids d/w pt/see results on chart without significant abnormality; no further symptoms,  to f/u any worsening symptoms or concerns

## 2011-06-26 NOTE — Assessment & Plan Note (Signed)
stable overall by hx and exam, most recent data reviewed with pt, and pt to continue medical treatment as before, has somewhat mild elev BP but pt declines further med change at this time  BP Readings from Last 3 Encounters:  06/25/11 146/90  06/11/11 142/92  01/19/11 142/86

## 2011-06-26 NOTE — Progress Notes (Signed)
  Subjective:    Patient ID: Teresa Riley, female    DOB: 1935/02/21, 76 y.o.   MRN: [id number]  HPI  Here to f/u; overall doing quite nicely, with no significant orthostasis or syncope since last seen with med changes and increase po fluids.  Pt denies chest pain, increased sob or doe, wheezing, orthopnea, PND, increased LE swelling, palpitations, dizziness or syncope.  Pt denies new neurological symptoms such as new headache, or facial or extremity weakness or numbness   Pt denies polydipsia, polyuria, or low sugar symptoms such as weakness or confusion improved with po intake.  Pt states overall good compliance with meds as is.   Pt denies fever, wt loss, night sweats, loss of appetite, or other constitutional symptoms Past Medical History  Diagnosis Date  . Hypertension   . HTN (hypertension) 10/29/2010  . H/O: hysterectomy 10/29/2010  . Chronic constipation 10/29/2010  . Eczema 10/29/2010  . History of cervical cancer 10/29/2010  . Allergic rhinitis, cause unspecified 10/29/2010   Past Surgical History  Procedure Date  . Abdominal hysterectomy 1970    reports that she has quit smoking. She does not have any smokeless tobacco history on file. She reports that she does not drink alcohol or use illicit drugs. family history includes Hypertension in her father and mother. No Known Allergies Current Outpatient Prescriptions on File Prior to Visit  Medication Sig Dispense Refill  . amLODipine (NORVASC) 10 MG tablet Take 1 tablet (10 mg total) by mouth daily.  90 tablet  3  . atorvastatin (LIPITOR) 40 MG tablet Take 1 tablet (40 mg total) by mouth at bedtime.  90 tablet  3  . lisinopril (PRINIVIL,ZESTRIL) 20 MG tablet Take 1 tablet (20 mg total) by mouth daily.  90 tablet  3  . potassium chloride (KLOR-CON 10) 10 MEQ CR tablet Take 1 tablet (10 mEq total) by mouth daily.  30 tablet  11   Current Facility-Administered Medications on File Prior to Visit  Medication Dose Route Frequency Provider  Last Rate Last Dose  . pneumococcal 23 valent vaccine (PNU-IMMUNE) injection 0.5 mL  0.5 mL Intramuscular Once Oliver Barre, MD        Review of Systems Review of Systems  Constitutional: Negative for diaphoresis and unexpected weight change.  HENT: Negative for drooling and tinnitus.   Eyes: Negative for photophobia and visual disturbance.  Respiratory: Negative for choking and stridor.   Gastrointestinal: Negative for vomiting and blood in stool.  Genitourinary: Negative for hematuria and decreased urine volume.  .      Objective:   Physical Exam BP 146/90  Pulse 84  Temp(Src) 97.6 F (36.4 C) (Oral)  Ht 5\' 2"  (1.575 m)  Wt 148 lb (67.132 kg)  BMI 27.07 kg/m2  SpO2 98% Physical Exam  VS noted Constitutional: Pt appears well-developed and well-nourished.  HENT: Head: Normocephalic.  Right Ear: External ear normal.  Left Ear: External ear normal.  Eyes: Conjunctivae and EOM are normal. Pupils are equal, round, and reactive to light.  Neck: Normal range of motion. Neck supple.  Cardiovascular: Normal rate and regular rhythm.   Pulmonary/Chest: Effort normal and breath sounds normal.  Abd:  Soft, NT, non-distended, + BS Neurological: Pt is alert. No cranial nerve deficit.  Skin: Skin is warm. No erythema.  Psychiatric: Pt behavior is normal. Thought content normal.     Assessment & Plan:

## 2011-10-01 ENCOUNTER — Other Ambulatory Visit: Payer: Self-pay

## 2011-10-01 MED ORDER — AMLODIPINE BESYLATE 10 MG PO TABS: 10.0000 mg | ORAL_TABLET | Freq: Every day | ORAL | Status: DC

## 2011-11-03 ENCOUNTER — Encounter: Payer: Self-pay | Admitting: Internal Medicine

## 2011-11-03 ENCOUNTER — Ambulatory Visit (INDEPENDENT_AMBULATORY_CARE_PROVIDER_SITE_OTHER): Payer: Medicare PPO | Admitting: Internal Medicine

## 2011-11-03 ENCOUNTER — Other Ambulatory Visit (INDEPENDENT_AMBULATORY_CARE_PROVIDER_SITE_OTHER): Payer: Medicare PPO

## 2011-11-03 VITALS — BP 144/90 | HR 79 | Temp 97.2°F | Ht 62.5 in | Wt 148.1 lb

## 2011-11-03 DIAGNOSIS — Z0000 Encounter for general adult medical examination without abnormal findings: Secondary | ICD-10-CM

## 2011-11-03 DIAGNOSIS — I10 Essential (primary) hypertension: Secondary | ICD-10-CM

## 2011-11-03 DIAGNOSIS — H9190 Unspecified hearing loss, unspecified ear: Secondary | ICD-10-CM

## 2011-11-03 DIAGNOSIS — R55 Syncope and collapse: Secondary | ICD-10-CM

## 2011-11-03 DIAGNOSIS — H9191 Unspecified hearing loss, right ear: Secondary | ICD-10-CM | POA: Insufficient documentation

## 2011-11-03 LAB — LIPID PANEL
Cholesterol: 282 mg/dL — ABNORMAL HIGH (ref 0–200)
HDL: 85.7 mg/dL (ref >39.00)
Total CHOL/HDL Ratio: 3
VLDL: 19.4 mg/dL (ref 0.0–40.0)

## 2011-11-03 LAB — HEPATIC FUNCTION PANEL
Bilirubin, Direct: 0.1 mg/dL (ref 0.0–0.3)
Total Bilirubin: 0.7 mg/dL (ref 0.3–1.2)

## 2011-11-03 LAB — CBC WITH DIFFERENTIAL/PLATELET
Basophils Absolute: 0 10*3/uL (ref 0.0–0.1)
Eosinophils Relative: 0.8 % (ref 0.0–5.0)
HCT: 41 % (ref 36.0–46.0)
Hemoglobin: 13.8 g/dL (ref 12.0–15.0)
MCHC: 33.5 g/dL (ref 30.0–36.0)
MCV: 94.8 fl (ref 78.0–100.0)
RBC: 4.33 Mil/uL (ref 3.87–5.11)
WBC: 5.2 10*3/uL (ref 4.5–10.5)

## 2011-11-03 LAB — URINALYSIS, ROUTINE W REFLEX MICROSCOPIC
Ketones, ur: NEGATIVE
Leukocytes, UA: NEGATIVE
Nitrite: NEGATIVE
Specific Gravity, Urine: 1.01 (ref 1.000–1.030)
pH: 7.5 (ref 5.0–8.0)

## 2011-11-03 LAB — LDL CHOLESTEROL, DIRECT: Direct LDL: 184.2 mg/dL

## 2011-11-03 LAB — BASIC METABOLIC PANEL
BUN: 11 mg/dL (ref 6–23)
Chloride: 102 mEq/L (ref 96–112)
Creatinine, Ser: 0.8 mg/dL (ref 0.4–1.2)
GFR: 87.44 mL/min (ref >60.00)

## 2011-11-03 LAB — TSH: TSH: 1.19 u[IU]/mL (ref 0.35–5.50)

## 2011-11-03 MED ORDER — AMLODIPINE BESYLATE 10 MG PO TABS: 10.0000 mg | ORAL_TABLET | Freq: Every day | ORAL | Status: DC

## 2011-11-03 MED ORDER — LISINOPRIL 20 MG PO TABS: 20.0000 mg | ORAL_TABLET | Freq: Every day | ORAL | Status: DC

## 2011-11-03 MED ORDER — ASPIRIN 81 MG PO TBEC: 81.0000 mg | DELAYED_RELEASE_TABLET | Freq: Every day | ORAL | Status: DC

## 2011-11-03 MED ORDER — ATORVASTATIN CALCIUM 40 MG PO TABS: 40.0000 mg | ORAL_TABLET | Freq: Every day | ORAL | Status: DC

## 2011-11-03 NOTE — Patient Instructions (Addendum)
Continue all other medications as before Your refills were done today to [facility] You will be contacted regarding the referral for: colonoscopy Your right ear was irrigated of wax today Please go to LAB in the Basement for the blood and/or urine tests to be done today You will be contacted by phone if any changes need to be made immediately.  Otherwise, you will receive a letter about your results with an explanation. Please return in 1 year for your yearly visit, or sooner if needed, with Lab testing done 3-5 days before

## 2011-11-03 NOTE — Assessment & Plan Note (Signed)
Overall doing well, age appropriate education and counseling updated, referrals for preventative services and immunizations addressed, dietary and smoking counseling addressed, most recent labs and ECG reviewed.  I have personally reviewed and have noted: 1) the patient's medical and social history 2) The pt's use of alcohol, tobacco, and illicit drugs 3) The patient's current medications and supplements 4) Functional ability including ADL's, fall risk, home safety risk, hearing and visual impairment 5) Diet and physical activities 6) Evidence for depression or mood disorder 7) The patient's height, weight, and BMI have been recorded in the chart I have made referrals, and provided counseling and education based on review of the above For labs today, and due for colonoscopy

## 2011-11-03 NOTE — Progress Notes (Signed)
Subjective:    Patient ID: Teresa Riley, female    DOB: 1934/10/02, 77 y.o.   MRN: [id number]  HPI  Here for wellness and f/u;  Overall doing ok;  Pt denies CP, worsening SOB, DOE, wheezing, orthopnea, PND, worsening LE edema, palpitations, dizziness or syncope.  Pt denies neurological change such as new Headache, facial or extremity weakness.  Pt denies polydipsia, polyuria, or low sugar symptoms. Pt states overall good compliance with treatment and medications, good tolerability, and trying to follow lower cholesterol diet.  Pt denies worsening depressive symptoms, suicidal ideation or panic. No fever, wt loss, night sweats, loss of appetite, or other constitutional symptoms.  Pt states good ability with ADL's, low fall risk, home safety reviewed and adequate, no significant changes in hearing or vision, and occasionally active with exercise.  BP at home consistently with SBP < 140 per pt, no acute complaints today except some decreased hearing on the right with wax impaction. Past Medical History  Diagnosis Date  . Hypertension   . HTN (hypertension) 10/29/2010  . H/O: hysterectomy 10/29/2010  . Chronic constipation 10/29/2010  . Eczema 10/29/2010  . History of cervical cancer 10/29/2010  . Allergic rhinitis, cause unspecified 10/29/2010   Past Surgical History  Procedure Date  . Abdominal hysterectomy 1970    reports that she has quit smoking. She does not have any smokeless tobacco history on file. She reports that she does not drink alcohol or use illicit drugs. family history includes Hypertension in her father and mother. No Known Allergies Current Outpatient Prescriptions on File Prior to Visit  Medication Sig Dispense Refill  . DISCONTD: amLODipine (NORVASC) 10 MG tablet Take 1 tablet (10 mg total) by mouth daily.  90 tablet  3  . DISCONTD: atorvastatin (LIPITOR) 40 MG tablet Take 1 tablet (40 mg total) by mouth at bedtime.  90 tablet  3  . DISCONTD: lisinopril (PRINIVIL,ZESTRIL) 20 MG  tablet Take 1 tablet (20 mg total) by mouth daily.  90 tablet  3  . potassium chloride (KLOR-CON 10) 10 MEQ CR tablet Take 1 tablet (10 mEq total) by mouth daily.  30 tablet  11   Current Facility-Administered Medications on File Prior to Visit  Medication Dose Route Frequency Provider Last Rate Last Dose  . DISCONTD: pneumococcal 23 valent vaccine (PNU-IMMUNE) injection 0.5 mL  0.5 mL Intramuscular Once Corwin Levins, MD       Review of Systems Review of Systems  Constitutional: Negative for diaphoresis, activity change, appetite change and unexpected weight change.  HENT: Negative for hearing loss, ear pain, facial swelling, mouth sores and neck stiffness.   Eyes: Negative for pain, redness and visual disturbance.  Respiratory: Negative for shortness of breath and wheezing.   Cardiovascular: Negative for chest pain and palpitations.  Gastrointestinal: Negative for diarrhea, blood in stool, abdominal distention and rectal pain.  Genitourinary: Negative for hematuria, flank pain and decreased urine volume.  Musculoskeletal: Negative for myalgias and joint swelling.  Skin: Negative for color change and wound.  Neurological: Negative for syncope and numbness.  Hematological: Negative for adenopathy.  Psychiatric/Behavioral: Negative for hallucinations, self-injury, decreased concentration and agitation.      Objective:   Physical Exam BP 144/90  Pulse 79  Temp(Src) 97.2 F (36.2 C) (Oral)  Ht 5' 2.5" (1.588 m)  Wt 148 lb 2 oz (67.189 kg)  BMI 26.66 kg/m2  SpO2 97% Physical Exam  VS noted, not ill appearing Constitutional: Pt is oriented to person, place, and time. Appears  well-developed and well-nourished.  HENT:  Head: Normocephalic and atraumatic.  Right Ear: External ear normal.  Left Ear: External ear normal.  Right canal cleared of wax, hearing improved Nose: Nose normal.  Mouth/Throat: Oropharynx is clear and moist.  Eyes: Conjunctivae and EOM are normal. Pupils are equal,  round, and reactive to light.  Neck: Normal range of motion. Neck supple. No JVD present. No tracheal deviation present.  Cardiovascular: Normal rate, regular rhythm, normal heart sounds and intact distal pulses.   Pulmonary/Chest: Effort normal and breath sounds normal.  Abdominal: Soft. Bowel sounds are normal. There is no tenderness.  Musculoskeletal: Normal range of motion. Exhibits no edema.  Lymphadenopathy:  Has no cervical adenopathy.  Neurological: Pt is alert and oriented to person, place, and time. Pt has normal reflexes. No cranial nerve deficit.  Skin: Skin is warm and dry. No rash noted.  Psychiatric:  Has  normal mood and affect. Behavior is normal.     Assessment & Plan:

## 2011-11-03 NOTE — Assessment & Plan Note (Signed)
Improved with irrigation,  to f/u any worsening symptoms or concerns  

## 2011-11-10 ENCOUNTER — Encounter: Payer: Self-pay | Admitting: Gastroenterology

## 2012-01-12 ENCOUNTER — Other Ambulatory Visit: Payer: Medicare PPO | Admitting: Gastroenterology

## 2012-05-10 ENCOUNTER — Encounter: Payer: Self-pay | Admitting: Gastroenterology

## 2012-05-26 ENCOUNTER — Ambulatory Visit (AMBULATORY_SURGERY_CENTER): Payer: Medicare PPO | Admitting: *Deleted

## 2012-05-26 VITALS — Ht 63.0 in | Wt 155.0 lb

## 2012-05-26 DIAGNOSIS — Z1211 Encounter for screening for malignant neoplasm of colon: Secondary | ICD-10-CM

## 2012-05-26 MED ORDER — MOVIPREP 100 G PO SOLR: ORAL | Status: DC

## 2012-05-29 ENCOUNTER — Encounter: Payer: Self-pay | Admitting: Gastroenterology

## 2012-06-01 ENCOUNTER — Encounter (HOSPITAL_COMMUNITY): Payer: Self-pay | Admitting: *Deleted

## 2012-06-01 ENCOUNTER — Emergency Department (HOSPITAL_COMMUNITY)
Admission: EM | Admit: 2012-06-01 | Discharge: 2012-06-02 | Disposition: A | Discharge status: Home / Self Care | Payer: Medicare PPO | Attending: Emergency Medicine | Admitting: Emergency Medicine

## 2012-06-01 DIAGNOSIS — R5381 Other malaise: Secondary | ICD-10-CM | POA: Insufficient documentation

## 2012-06-01 DIAGNOSIS — I10 Essential (primary) hypertension: Secondary | ICD-10-CM | POA: Insufficient documentation

## 2012-06-01 DIAGNOSIS — K5900 Constipation, unspecified: Secondary | ICD-10-CM | POA: Insufficient documentation

## 2012-06-01 DIAGNOSIS — R350 Frequency of micturition: Secondary | ICD-10-CM | POA: Insufficient documentation

## 2012-06-01 DIAGNOSIS — I951 Orthostatic hypotension: Secondary | ICD-10-CM | POA: Insufficient documentation

## 2012-06-01 DIAGNOSIS — Z8589 Personal history of malignant neoplasm of other organs and systems: Secondary | ICD-10-CM | POA: Insufficient documentation

## 2012-06-01 DIAGNOSIS — E785 Hyperlipidemia, unspecified: Secondary | ICD-10-CM | POA: Insufficient documentation

## 2012-06-01 DIAGNOSIS — J309 Allergic rhinitis, unspecified: Secondary | ICD-10-CM | POA: Insufficient documentation

## 2012-06-01 DIAGNOSIS — R002 Palpitations: Secondary | ICD-10-CM | POA: Insufficient documentation

## 2012-06-01 DIAGNOSIS — Z79899 Other long term (current) drug therapy: Secondary | ICD-10-CM | POA: Insufficient documentation

## 2012-06-01 DIAGNOSIS — R42 Dizziness and giddiness: Secondary | ICD-10-CM | POA: Insufficient documentation

## 2012-06-01 DIAGNOSIS — R5383 Other fatigue: Secondary | ICD-10-CM | POA: Insufficient documentation

## 2012-06-01 LAB — URINALYSIS, ROUTINE W REFLEX MICROSCOPIC
Bilirubin Urine: NEGATIVE
Ketones, ur: NEGATIVE mg/dL
Nitrite: NEGATIVE
Protein, ur: NEGATIVE mg/dL
Specific Gravity, Urine: 1.007 (ref 1.005–1.030)
Urobilinogen, UA: 0.2 mg/dL (ref 0.0–1.0)

## 2012-06-01 LAB — POCT I-STAT, CHEM 8
Calcium, Ion: 1.21 mmol/L (ref 1.13–1.30)
Chloride: 106 mEq/L (ref 96–112)
HCT: 40 % (ref 36.0–46.0)
Potassium: 3.4 mEq/L — ABNORMAL LOW (ref 3.5–5.1)

## 2012-06-01 LAB — URINE MICROSCOPIC-ADD ON

## 2012-06-01 MED ORDER — SODIUM CHLORIDE 0.9 % IV BOLUS (SEPSIS): 500.0000 mL | Freq: Once | INTRAVENOUS | Status: DC

## 2012-06-01 MED ORDER — POTASSIUM CHLORIDE CRYS ER 20 MEQ PO TBCR
40.0000 meq | EXTENDED_RELEASE_TABLET | Freq: Once | ORAL | Status: AC
  Administered 2012-06-01: 40 meq via ORAL
  Filled 2012-06-01: qty 2

## 2012-06-01 MED ORDER — SODIUM CHLORIDE 0.9 % IV BOLUS (SEPSIS)
500.0000 mL | Freq: Once | INTRAVENOUS | Status: AC
  Administered 2012-06-01: 500 mL via INTRAVENOUS

## 2012-06-01 MED ORDER — SODIUM CHLORIDE 0.9 % IV BOLUS (SEPSIS)
1000.0000 mL | Freq: Once | INTRAVENOUS | Status: AC
  Administered 2012-06-01: 1000 mL via INTRAVENOUS

## 2012-06-01 NOTE — ED Notes (Signed)
Per pt and family - pt has hx of htn - today pt noted her BP to by elevated more than normal. Pt's family states when EMS was evaluating pt her BP was noted to be 230/105. Pt denies any associating symptoms - pt takes x2 rx meds for htn. Pt in no acute distress at present.

## 2012-06-01 NOTE — ED Provider Notes (Signed)
History     CSN: [id number]  Arrival date & time 06/01/12  1914   First MD Initiated Contact with Patient 06/01/12 2054      Chief Complaint  Patient presents with  . Hypertension   HPI  History provided by the patient and daughters. Patient is a 77 year old female with history of hypertension who presents with concerns for elevated blood pressure. Patient has also had recent complaints of generalized fatigue and weakness. This has been waxing and waning for the past week. Patient also reports feeling some increased heart rate and palpitations today. She noticed her blood pressure began to be elevated despite her normal medications this afternoon and evening. Patient also states her heart rate increased to the upper 90s and low 100s. Her daughters called EMS and her blood pressure was reported to be 230/105 when they arrived. Patient reports taking her normal medications as prescribed. Denies any missed doses. She denies having any other symptoms. Denies any chest pain or shortness of breath. Denies any headache or confusion. Denies any fever, chills or sweats. No recent vomiting or diarrhea episodes. Patient has had normal appetite reports drinking a lot of fluids with frequent urination.    Past Medical History  Diagnosis Date  . Hypertension   . HTN (hypertension) 10/29/2010  . H/O: hysterectomy 10/29/2010  . Chronic constipation 10/29/2010  . Eczema 10/29/2010  . History of cervical cancer 10/29/2010  . Allergic rhinitis, cause unspecified 10/29/2010  . Hyperlipidemia     Past Surgical History  Procedure Date  . Abdominal hysterectomy 1970  . Breast biopsy 1980's    benign  . Cataract extraction w/ intraocular lens  implant, bilateral 01/2011    Family History  Problem Relation Age of Onset  . Hypertension Mother   . Hypertension Father     History  Substance Use Topics  . Smoking status: Former Games developer  . Smokeless tobacco: Never Used  . Alcohol Use: No    OB History     Grav Para Term Preterm Abortions TAB SAB Ect Mult Living                  Review of Systems  Constitutional: Positive for fatigue. Negative for fever and chills.  HENT: Negative for neck pain.   Respiratory: Negative for cough and shortness of breath.   Cardiovascular: Positive for palpitations. Negative for chest pain and leg swelling.  Gastrointestinal: Negative for nausea, vomiting and abdominal pain.  Genitourinary: Positive for frequency. Negative for dysuria, hematuria and flank pain.  Neurological: Positive for light-headedness. Negative for weakness, numbness and headaches.  All other systems reviewed and are negative.    Allergies  Review of patient's allergies indicates no known allergies.  Home Medications   Current Outpatient Rx  Name  Route  Sig  Dispense  Refill  . AMLODIPINE BESYLATE 10 MG PO TABS   Oral   Take 1 tablet (10 mg total) by mouth daily.   90 tablet   3   . ATORVASTATIN CALCIUM 40 MG PO TABS   Oral   Take 1 tablet (40 mg total) by mouth at bedtime.   90 tablet   3   . LISINOPRIL 20 MG PO TABS   Oral   Take 1 tablet (20 mg total) by mouth daily.   90 tablet   3     BP 166/95  Pulse 96  Temp 98.4 F (36.9 C) (Oral)  Resp 16  SpO2 98%  Physical Exam  Nursing note and  vitals reviewed. Constitutional: She is oriented to person, place, and time. She appears well-developed and well-nourished. No distress.  HENT:  Head: Normocephalic.       Mouth appears dry.  Eyes: EOM are normal. Pupils are equal, round, and reactive to light.       Arcus senilis   Cardiovascular: Normal rate and regular rhythm.   Pulmonary/Chest: Effort normal and breath sounds normal. No respiratory distress. She has no wheezes. She has no rales.  Abdominal: Soft. There is no tenderness. There is no rebound and no guarding.  Neurological: She is alert and oriented to person, place, and time. She has normal strength. No cranial nerve deficit or sensory deficit.    Skin: Skin is warm and dry. No rash noted. No erythema.  Psychiatric: She has a normal mood and affect. Her behavior is normal.    ED Course  Procedures   Results for orders placed during the hospital encounter of 06/01/12  URINALYSIS, ROUTINE W REFLEX MICROSCOPIC      Component Value Range   Color, Urine YELLOW  YELLOW   APPearance CLOUDY (*) CLEAR   Specific Gravity, Urine 1.007  1.005 - 1.030   pH 7.5  5.0 - 8.0   Glucose, UA NEGATIVE  NEGATIVE mg/dL   Hgb urine dipstick TRACE (*) NEGATIVE   Bilirubin Urine NEGATIVE  NEGATIVE   Ketones, ur NEGATIVE  NEGATIVE mg/dL   Protein, ur NEGATIVE  NEGATIVE mg/dL   Urobilinogen, UA 0.2  0.0 - 1.0 mg/dL   Nitrite NEGATIVE  NEGATIVE   Leukocytes, UA NEGATIVE  NEGATIVE  URINE MICROSCOPIC-ADD ON      Component Value Range   Squamous Epithelial / LPF RARE  RARE   RBC / HPF 0-2  <3 RBC/hpf  POCT I-STAT, CHEM 8      Component Value Range   Sodium 141  135 - 145 mEq/L   Potassium 3.4 (*) 3.5 - 5.1 mEq/L   Chloride 106  96 - 112 mEq/L   BUN 7  6 - 23 mg/dL   Creatinine, Ser 4.09  0.50 - 1.10 mg/dL   Glucose, Bld 811 (*) 70 - 99 mg/dL   Calcium, Ion 9.14  7.82 - 1.30 mmol/L   TCO2 26  0 - 100 mmol/L   Hemoglobin 13.6  12.0 - 15.0 g/dL   HCT 95.6  21.3 - 08.6 %       1. Hypertension   2. Orthostatic hypotension       MDM  9:25PM patient seen and evaluated. Patient resting comfortably in no acute distress. Patient currently without any complaints or symptoms.   Patient was signs of orthostatic hypotension with significant drop in SBP. IV fluids given. Labs with slight hypokalemia and potassium given. Tests otherwise unremarkable. No changes on ECG.     Date: 06/01/2012  Rate: 97  Rhythm: normal sinus rhythm  QRS Axis: normal  Intervals: Borderline PR interval  ST/T Wave abnormalities: normal  Conduction Disutrbances: Borderline AV conduction delay.  Narrative Interpretation: PVC, bi atrial abnormalities  Old EKG  Reviewed: Unchanged from 11/15/2002      Angus Seller, PA 06/02/12 [phone]

## 2012-06-01 NOTE — ED Notes (Signed)
Dammen, PA at bedside.  

## 2012-06-03 NOTE — ED Provider Notes (Signed)
Medical screening examination/treatment/procedure(s) were performed by non-physician practitioner and as supervising physician I was immediately available for consultation/collaboration.  Flint Melter, MD 06/03/12 2350

## 2012-06-05 ENCOUNTER — Encounter: Payer: Self-pay | Admitting: Internal Medicine

## 2012-06-05 ENCOUNTER — Ambulatory Visit (INDEPENDENT_AMBULATORY_CARE_PROVIDER_SITE_OTHER): Payer: Medicare PPO | Admitting: Internal Medicine

## 2012-06-05 VITALS — BP 174/90 | HR 92 | Temp 97.4°F | Ht 63.0 in | Wt 157.1 lb

## 2012-06-05 DIAGNOSIS — R259 Unspecified abnormal involuntary movements: Secondary | ICD-10-CM

## 2012-06-05 DIAGNOSIS — R55 Syncope and collapse: Secondary | ICD-10-CM

## 2012-06-05 DIAGNOSIS — R413 Other amnesia: Secondary | ICD-10-CM | POA: Insufficient documentation

## 2012-06-05 DIAGNOSIS — R251 Tremor, unspecified: Secondary | ICD-10-CM

## 2012-06-05 DIAGNOSIS — I951 Orthostatic hypotension: Secondary | ICD-10-CM

## 2012-06-05 DIAGNOSIS — I10 Essential (primary) hypertension: Secondary | ICD-10-CM

## 2012-06-05 HISTORY — DX: Orthostatic hypotension: I95.1

## 2012-06-05 MED ORDER — ASPIRIN 81 MG PO TBEC: 81.0000 mg | DELAYED_RELEASE_TABLET | Freq: Every day | ORAL | Status: DC

## 2012-06-05 MED ORDER — LISINOPRIL 40 MG PO TABS: 40.0000 mg | ORAL_TABLET | Freq: Every day | ORAL | Status: DC

## 2012-06-05 NOTE — Patient Instructions (Addendum)
Please re-start Aspirin at 81 mg -  1 per day - Enteric Coated OK to increase the Lisinopril to 40 mg per day Continue all other medications as before Please have the pharmacy call with any other refills you may need. Please check your blood pressure on a regular basis; your goal is to be about 140/90, so that the top number does not drop too low with standing Please return for LAB only in 1 wk  (already in the computer) You will be contacted by phone if any changes need to be made immediately.  Otherwise, you will receive a letter about your results with an explanation, but please check with MyChart first. Thank you for enrolling in MyChart. Please follow the instructions below to securely access your online medical record. MyChart allows you to send messages to your doctor, view your test results, renew your prescriptions, schedule appointments, and more. To Log into MyChart, please go to [email or web address], and your Username is: [email or web address] You will be contacted regarding the referral for: neurology, and cardiology Please return in 6 months, or sooner if needed

## 2012-06-05 NOTE — Assessment & Plan Note (Signed)
Ok to increase ACEI with f/U labs in 1 wk, goal SBP about 140 to avoid lower BP with standing

## 2012-06-05 NOTE — Assessment & Plan Note (Addendum)
Suspect possible autonomic dysfunction, for neuro referral as well, with recurrent syncope - ? Florinef  Note:  Total time for pt hx, exam, review of record with pt in the room, determination of diagnoses and plan for further eval and tx is > 40 min, with over 50% spent in coordination and counseling of patient

## 2012-06-05 NOTE — Progress Notes (Signed)
  Subjective:    Patient ID: Teresa Riley, female    DOB: Aug 18, 1934, 77 y.o.   MRN: [id number]  HPI   Here to f/u after being seen in the ER with orthostatic hypotention, tx with IVF;s but pt without significant improvement in general weakness worse with standing, in fact has had syncope about 4-5 times in the past 5 yrs, only seeking medical help twice.  Pt denies chest pain, increased sob or doe, wheezing, orthopnea, PND, increased LE swelling, palpitations.  Pt denies new neurological symptoms such as new headache, or facial or extremity weakness or numbness   Pt denies polydipsia, polyuria, or low sugar symptoms such as weakness or confusion improved with po intake.  Pt states overall good compliance with meds, trying to follow lower cholesterol diet, wt overall stable.   Pt denies fever, wt loss, night sweats, loss of appetite, or other constitutional symptoms.  Daughter also mentions recurring tremor, and worsening memory problem recent. Past Medical History  Diagnosis Date  . Hypertension   . HTN (hypertension) 10/29/2010  . H/O: hysterectomy 10/29/2010  . Chronic constipation 10/29/2010  . Eczema 10/29/2010  . History of cervical cancer 10/29/2010  . Allergic rhinitis, cause unspecified 10/29/2010  . Hyperlipidemia    Past Surgical History  Procedure Date  . Abdominal hysterectomy 1970  . Breast biopsy 1980's    benign  . Cataract extraction w/ intraocular lens  implant, bilateral 01/2011    reports that she has quit smoking. She has never used smokeless tobacco. She reports that she does not drink alcohol or use illicit drugs. family history includes Hypertension in her father and mother. No Known Allergies Current Outpatient Prescriptions on File Prior to Visit  Medication Sig Dispense Refill  . amLODipine (NORVASC) 10 MG tablet Take 1 tablet (10 mg total) by mouth daily.  90 tablet  3  . atorvastatin (LIPITOR) 40 MG tablet Take 1 tablet (40 mg total) by mouth at bedtime.  90 tablet   3   Review of Systems  Constitutional: Negative for diaphoresis and unexpected weight change.  HENT: Negative for tinnitus.   Eyes: Negative for photophobia and visual disturbance.  Respiratory: Negative for choking and stridor.   Gastrointestinal: Negative for vomiting and blood in stool.  Genitourinary: Negative for hematuria and decreased urine volume.  Musculoskeletal: Negative for gait problem.  Skin: Negative for color change and wound.  Neurological: Negative for tremors and numbness.  Psychiatric/Behavioral: Negative for decreased concentration. The patient is not hyperactive.       Objective:   Physical Exam        Assessment & Plan:

## 2012-06-05 NOTE — Assessment & Plan Note (Signed)
?   Tremulous vs tremor - for neuro referral per daughter  reqeust

## 2012-06-05 NOTE — Assessment & Plan Note (Signed)
?   Dementia, for neuro referral per request

## 2012-06-05 NOTE — Assessment & Plan Note (Signed)
Recurrent, also for card referral

## 2012-06-12 ENCOUNTER — Other Ambulatory Visit (INDEPENDENT_AMBULATORY_CARE_PROVIDER_SITE_OTHER): Payer: Medicare PPO

## 2012-06-12 DIAGNOSIS — I10 Essential (primary) hypertension: Secondary | ICD-10-CM

## 2012-06-12 LAB — CBC WITH DIFFERENTIAL/PLATELET
Eosinophils Relative: 0.8 % (ref 0.0–5.0)
HCT: 38.9 % (ref 36.0–46.0)
Lymphocytes Relative: 35.1 % (ref 12.0–46.0)
Monocytes Relative: 8.4 % (ref 3.0–12.0)
Neutrophils Relative %: 55.2 % (ref 43.0–77.0)
Platelets: 258 10*3/uL (ref 150.0–400.0)
WBC: 6.1 10*3/uL (ref 4.5–10.5)

## 2012-06-12 LAB — BASIC METABOLIC PANEL
BUN: 16 mg/dL (ref 6–23)
GFR: 78.4 mL/min (ref >60.00)
Glucose, Bld: 99 mg/dL (ref 70–99)
Potassium: 3.7 mEq/L (ref 3.5–5.1)

## 2012-06-12 LAB — LIPID PANEL
Cholesterol: 180 mg/dL (ref 0–200)
VLDL: 24.8 mg/dL (ref 0.0–40.0)

## 2012-06-12 LAB — HEPATIC FUNCTION PANEL
ALT: 20 U/L (ref 0–35)
AST: 26 U/L (ref 0–37)
Albumin: 3.6 g/dL (ref 3.5–5.2)
Total Protein: 7.5 g/dL (ref 6.0–8.3)

## 2012-06-13 ENCOUNTER — Other Ambulatory Visit: Payer: Medicare PPO | Admitting: Gastroenterology

## 2012-06-15 ENCOUNTER — Telehealth: Payer: Self-pay | Admitting: Internal Medicine

## 2012-06-15 NOTE — Telephone Encounter (Signed)
Robin to contact pt  We would really like her to see neurology and cardiology, due to her passing out spells, dizziness and ? Memory problem -   Would she consider letting us call [facility] to call her again to schedule her appt?

## 2012-06-15 NOTE — Telephone Encounter (Signed)
Called the patient informed of MD instructions.  She stated she does not want an appt. At this time as symptoms are now gone.

## 2012-06-15 NOTE — Telephone Encounter (Signed)
[facility] called to schedule pt and pt declined appt.

## 2012-06-22 ENCOUNTER — Ambulatory Visit (INDEPENDENT_AMBULATORY_CARE_PROVIDER_SITE_OTHER): Payer: Medicare PPO | Admitting: Cardiovascular Disease

## 2012-06-22 ENCOUNTER — Encounter: Payer: Self-pay | Admitting: Cardiovascular Disease

## 2012-06-22 VITALS — BP 208/100 | HR 100 | Ht 63.0 in | Wt 155.4 lb

## 2012-06-22 DIAGNOSIS — I10 Essential (primary) hypertension: Secondary | ICD-10-CM

## 2012-06-22 MED ORDER — CLONIDINE HCL 0.1 MG PO TABS: 0.1000 mg | ORAL_TABLET | Freq: Two times a day (BID) | ORAL | Status: DC

## 2012-06-22 MED ORDER — LISINOPRIL 40 MG PO TABS: 40.0000 mg | ORAL_TABLET | Freq: Every day | ORAL | Status: DC

## 2012-06-22 NOTE — Assessment & Plan Note (Signed)
See note above regarding changes and compliance

## 2012-06-22 NOTE — Patient Instructions (Signed)
Your physician recommends that you schedule a follow-up appointment in: SEE DR Jonny Ruiz AFTER RENAL  DUPLEX   Your physician has recommended you make the following change in your medication: STOP AMLODIPINE  INCREASE LISINOPRIL TO 40 MG EVERY DAY  AND ADD CLONIDINE 0.1 MG TWICE DAILY Your physician has requested that you have a renal artery duplex. During this test, an ultrasound is used to evaluate blood flow to the kidneys. Allow one hour for this exam. Do not eat after midnight the day before and avoid carbonated beverages. Take your medications as you usually do. DX 401.1

## 2012-06-22 NOTE — Addendum Note (Signed)
Addended by: Scherrie Bateman E on: 06/22/2012 10:58 AM   Modules accepted: Orders

## 2012-06-22 NOTE — Progress Notes (Signed)
Patient ID: Teresa Riley, female   DOB: [birth year], 77 y.o.   MRN: [id number] Here to f/u after being seen in the ER with orthostatic hypotention, tx with IVF;s but pt without significant improvement in general weakness worse with standing, in fact has had syncope about 4-5 times in the past 5 yrs, only seeking medical help twice. Pt denies chest pain, increased sob or doe, wheezing, orthopnea, PND, increased LE swelling, palpitations. Pt denies new neurological symptoms such as new headache, or facial or extremity weakness or numbness Pt denies polydipsia, polyuria, or low sugar symptoms such as weakness or confusion improved with po intake. Pt states overall good compliance with meds, trying to follow lower cholesterol diet, wt overall stable. Pt denies fever, wt loss, night sweats, loss of appetite, or other constitutional symptoms. Daughter also mentions recurring tremor, and worsening memory problem recent.  Since ER visit she does not take her amlodipine She thinks this makes her worse and lightheaded.  No chest pain No previous renal issues and Cr normal.  Indicates she is compliant with her lisinopril  Echo 06/16/11 ok  Carotids done same time normal.   Study Conclusions  - Left ventricle: Wall thickness was increased in a pattern of moderate LVH. Systolic function was vigorous. The estimated ejection fraction was in the range of 65% to 70%. There was an increased relative contribution of atrial contraction to ventricular filling. - Left atrium: The atrium was mildly dilated. - Atrial septum: There was an atrial septal aneurysm. - Tricuspid valve: Moderate regurgitation. Transthoracic echocardiography. M-mode, complete    ROS: Denies fever, malais, weight loss, blurry vision, decreased visual acuity, cough, sputum, SOB, hemoptysis, pleuritic pain, palpitaitons, heartburn, abdominal pain, melena, lower extremity edema, claudication, or rash.  All other systems reviewed and  negative   General:  Not postural today just Hypertensive Affect appropriate Healthy:  appears stated age HEENT: normal Neck supple with no adenopathy JVP normal no bruits no thyromegaly Lungs clear with no wheezing and good diaphragmatic motion Heart:  S1/S2 no murmur,rub, gallop or click PMI normal Abdomen: benighn, BS positve, no tenderness, no AAA no bruit.  No HSM or HJR Distal pulses intact with no bruits No edema Neuro non-focal Skin warm and dry No muscular weakness  Medications Current Outpatient Prescriptions  Medication Sig Dispense Refill  . amLODipine (NORVASC) 10 MG tablet Take 1 tablet (10 mg total) by mouth daily.  90 tablet  3  . aspirin 81 MG EC tablet Take 1 tablet (81 mg total) by mouth daily. Swallow whole.  30 tablet  12  . atorvastatin (LIPITOR) 40 MG tablet Take 1 tablet (40 mg total) by mouth at bedtime.  90 tablet  3  . lisinopril (PRINIVIL,ZESTRIL) 20 MG tablet Take 20 mg by mouth daily.      Marland Kitchen lisinopril (PRINIVIL,ZESTRIL) 40 MG tablet Take 20 mg by mouth daily.        Allergies Review of patient's allergies indicates no known allergies.  Family History: Family History  Problem Relation Age of Onset  . Hypertension Mother   . Hypertension Father     Social History: History   Social History  . Marital Status: Divorced    Spouse Name: N/A    Number of Children: N/A  . Years of Education: N/A   Occupational History  . Not on file.   Social History Main Topics  . Smoking status: Former Games developer  . Smokeless tobacco: Never Used  . Alcohol Use: No  . Drug Use: No  .  Sexually Active: Not on file   Other Topics Concern  . Not on file   Social History Narrative  . No narrative on file    Electrocardiogram:  SR rate 97 normal 06/02/12  Assessment and Plan

## 2012-06-22 NOTE — Assessment & Plan Note (Signed)
Not clear this is related to her heart Not orthostatic now.  Needs better BP rx  Stop amlodipine as it makes her feel worse and she is not taking it.  Double lisinopril and add clonidine.  F/U Renal duplex to R/O RAS  Can f/u with Dr Jonny Ruiz and primary care  No evidence of cardiac issues with normal ECG, echo and exam

## 2012-06-30 ENCOUNTER — Encounter (INDEPENDENT_AMBULATORY_CARE_PROVIDER_SITE_OTHER): Payer: Medicare PPO

## 2012-06-30 DIAGNOSIS — I10 Essential (primary) hypertension: Secondary | ICD-10-CM

## 2012-07-07 NOTE — Progress Notes (Signed)
noted 

## 2012-07-11 ENCOUNTER — Ambulatory Visit (INDEPENDENT_AMBULATORY_CARE_PROVIDER_SITE_OTHER): Payer: Medicare PPO | Admitting: Internal Medicine

## 2012-07-11 ENCOUNTER — Encounter: Payer: Self-pay | Admitting: Internal Medicine

## 2012-07-11 VITALS — BP 182/100 | HR 85 | Temp 97.8°F | Wt 157.6 lb

## 2012-07-11 DIAGNOSIS — Z23 Encounter for immunization: Secondary | ICD-10-CM

## 2012-07-11 DIAGNOSIS — I10 Essential (primary) hypertension: Secondary | ICD-10-CM

## 2012-07-11 MED ORDER — METOPROLOL SUCCINATE ER 25 MG PO TB24: 25.0000 mg | ORAL_TABLET | Freq: Every day | ORAL | Status: DC

## 2012-07-11 NOTE — Addendum Note (Signed)
Addended by: Deatra James on: 07/11/2012 02:53 PM   Modules accepted: Orders

## 2012-07-11 NOTE — Progress Notes (Signed)
Subjective:    Patient ID: Teresa Riley, female    DOB: 1935-01-07, 77 y.o.   MRN: [id number]  HPI  Pt presents to the clinic today with c/o high blood pressure. She was recently seen in the hospital for orthostatic hypotension. Prior to that she was on Lisinopril 40 mg as well as a diuretic. They took her off her diuretic and left her on the Lisinopril and had her follow up with cardiology. At that time, the cardiologist put her on clonidine in addition to her Lisinopril. The patient states that it has not been effective. Her blood pressures are normal in the 180's/100. She is experiencing fatigue and headaches. She does not consume a lot of salt in her diet. Additionally, the patient c/o of right ear pain and dizziness. She has a problem with wax buildup and often times has to have her ears cleaned out. She has not tried to get any wax out on her own. She denies syncopal episodes.  Review of Systems  Past Medical History  Diagnosis Date  . Hypertension   . HTN (hypertension) 10/29/2010  . H/O: hysterectomy 10/29/2010  . Chronic constipation 10/29/2010  . Eczema 10/29/2010  . History of cervical cancer 10/29/2010  . Allergic rhinitis, cause unspecified 10/29/2010  . Hyperlipidemia   . Orthostatic hypotension 06/05/2012    Current Outpatient Prescriptions  Medication Sig Dispense Refill  . aspirin 81 MG EC tablet Take 1 tablet (81 mg total) by mouth daily. Swallow whole.  30 tablet  12  . atorvastatin (LIPITOR) 40 MG tablet Take 1 tablet (40 mg total) by mouth at bedtime.  90 tablet  3  . metoprolol succinate (TOPROL-XL) 25 MG 24 hr tablet Take 1 tablet (25 mg total) by mouth daily.  30 tablet  0    No Known Allergies  Family History  Problem Relation Age of Onset  . Hypertension Mother   . Hypertension Father     History   Social History  . Marital Status: Divorced    Spouse Name: N/A    Number of Children: N/A  . Years of Education: N/A   Occupational History  . Not on  file.   Social History Main Topics  . Smoking status: Former Games developer  . Smokeless tobacco: Never Used  . Alcohol Use: No  . Drug Use: No  . Sexually Active: Not on file   Other Topics Concern  . Not on file   Social History Narrative  . No narrative on file     Constitutional: Pt reports fatigue and headache. Denies fever, malaise or abrupt weight changes.  HEENT: Denies blurred vision, eye pain, eye redness, ear pain, ringing in the ears, wax buildup, runny nose, nasal congestion, bloody nose, or sore throat. Respiratory: Denies difficulty breathing, shortness of breath, cough or sputum production.   Cardiovascular: Denies chest pain, chest tightness, palpitations or swelling in the hands or feet.  Musculoskeletal: Denies decrease in range of motion, difficulty with gait, muscle pain or joint pain and swelling.  Neurological: Denies dizziness, difficulty with memory, difficulty with speech or problems with balance and coordination.   No other specific complaints in a complete review of systems (except as listed in HPI above).     Objective:   Physical Exam   BP 182/100  Pulse 85  Temp 97.8 F (36.6 C) (Oral)  Wt 157 lb 9.6 oz (71.487 kg)  SpO2 97% Wt Readings from Last 3 Encounters:  07/11/12 157 lb 9.6 oz (71.487 kg)  06/22/12 155 lb 6.4 oz (70.489 kg)  06/05/12 157 lb 2 oz (71.271 kg)    General: Appear her stated age, well developed, well nourished in NAD. Cardiovascular: Normal rate and rhythm. S1,S2 noted.  No murmur, rubs or gallops noted. No JVD or BLE edema. No carotid bruits noted. Pulmonary/Chest: Normal effort and positive vesicular breath sounds. No respiratory distress. No wheezes, rales or ronchi noted.  Musculoskeletal: Normal range of motion. No signs of joint swelling. No difficulty with gait.  Neurological: Alert and oriented. Cranial nerves II-XII intact. Coordination normal. +DTRs bilaterally. Psychiatric: Mood and affect normal. Behavior is normal.  Judgment and thought content normal.   EKG:  BMET    Component Value Date/Time   NA 139 06/12/2012 1115   K 3.7 06/12/2012 1115   CL 103 06/12/2012 1115   CO2 28 06/12/2012 1115   GLUCOSE 99 06/12/2012 1115   BUN 16 06/12/2012 1115   CREATININE 0.9 06/12/2012 1115   CALCIUM 9.4 06/12/2012 1115    Lipid Panel     Component Value Date/Time   CHOL 180 06/12/2012 1115   TRIG 124.0 06/12/2012 1115   HDL 68.80 06/12/2012 1115   CHOLHDL 3 06/12/2012 1115   VLDL 24.8 06/12/2012 1115   LDLCALC 86 06/12/2012 1115    CBC    Component Value Date/Time   WBC 6.1 06/12/2012 1115   RBC 4.16 06/12/2012 1115   HGB 13.0 06/12/2012 1115   HCT 38.9 06/12/2012 1115   PLT 258.0 06/12/2012 1115   MCV 93.4 06/12/2012 1115   MCHC 33.5 06/12/2012 1115   RDW 12.9 06/12/2012 1115   LYMPHSABS 2.1 06/12/2012 1115   MONOABS 0.5 06/12/2012 1115   EOSABS 0.1 06/12/2012 1115   BASOSABS 0.0 06/12/2012 1115    Hgb A1C No results found for this basename: HGBA1C        Assessment & Plan:   Hypertension, uncontrolled, with additional workup required:  Avoid salt in your diet Continue Lisinopril 4 mg Discontinue Clonidine Will add Metoprolol 25 mg Daily Check your BP daily  Cerumen impaction, right ear, new onset with additional workup required:  Ear lavage today Try OTC Debrox which can be purchased at your local drug store  RTC in 2 weeks for a blood pressure check

## 2012-07-11 NOTE — Patient Instructions (Addendum)

## 2012-07-13 ENCOUNTER — Telehealth: Payer: Self-pay | Admitting: Internal Medicine

## 2012-07-13 NOTE — Telephone Encounter (Signed)
Called daughter back gave her appt for tomorrow to see Dr. Jonny Ruiz...Teresa Riley

## 2012-07-13 NOTE — Telephone Encounter (Signed)
Called daughter back she states mom BP is still elevated today it was 176-105. She has started new med metoprolol that [place] gave her on Tues. Wanting on advisement on BP. Have appt schedule to see Dr. Jonny Ruiz on 07/27/11...Raechel Chute

## 2012-07-13 NOTE — Telephone Encounter (Signed)
Daughter, [name] calling.  Her b/p today is 176/105.  It was 238/122.  Denies any chest pain or shortness of breath.   Was in the office 12/31 and the medication list that the daughter has is different from the med list in [facility].  She is giving her Lisinopril 40mg  po daily, Topral XL 25mg  po daily, Asa 81mg  and Lipitor 40mg .     Please verify these medications.

## 2012-07-13 NOTE — Telephone Encounter (Signed)
Teresa Riley, Can you see if she can be put on the schedule with Dr. Jonny Ruiz or someone else tommorow. [name]

## 2012-07-14 ENCOUNTER — Ambulatory Visit (INDEPENDENT_AMBULATORY_CARE_PROVIDER_SITE_OTHER): Payer: Medicare PPO | Admitting: Internal Medicine

## 2012-07-14 ENCOUNTER — Encounter: Payer: Self-pay | Admitting: Internal Medicine

## 2012-07-14 VITALS — BP 192/120 | HR 91 | Temp 98.6°F | Ht 63.0 in | Wt 156.4 lb

## 2012-07-14 DIAGNOSIS — R259 Unspecified abnormal involuntary movements: Secondary | ICD-10-CM

## 2012-07-14 DIAGNOSIS — R251 Tremor, unspecified: Secondary | ICD-10-CM

## 2012-07-14 DIAGNOSIS — I10 Essential (primary) hypertension: Secondary | ICD-10-CM

## 2012-07-14 DIAGNOSIS — R55 Syncope and collapse: Secondary | ICD-10-CM

## 2012-07-14 MED ORDER — HYDROCHLOROTHIAZIDE 25 MG PO TABS: 25.0000 mg | ORAL_TABLET | Freq: Every day | ORAL | Status: DC

## 2012-07-14 MED ORDER — METOPROLOL SUCCINATE ER 50 MG PO TB24: 50.0000 mg | ORAL_TABLET | Freq: Every day | ORAL | Status: DC

## 2012-07-14 MED ORDER — LISINOPRIL 40 MG PO TABS: 40.0000 mg | ORAL_TABLET | Freq: Every day | ORAL | Status: DC

## 2012-07-14 NOTE — Patient Instructions (Addendum)
OK to increase the metoprolol ER to 50 mg per day (you can take 2 of the 25 mg pills per day to use them up, then start the new 50 mg pill at ONE per day) OK to re-start the HCTZ 25 mg per day Continue all other medications as before, including the lisinopril Please return in 10-14 days for BLOOD work Only (just going to the lab) - for a "BMET" to check the potassium and kidneys (no fasting is required) You will be contacted by phone if any changes need to be made immediately.  Otherwise, you will receive a letter about your results with an explanation, but please check with MyChart first. Please continue to monitor your Blood Pressure at home; your goal should be about 140/90 or better Please return in 1 month, or sooner if needed

## 2012-07-15 ENCOUNTER — Encounter: Payer: Self-pay | Admitting: Internal Medicine

## 2012-07-15 NOTE — Progress Notes (Signed)
  Subjective:    Patient ID: Teresa Riley, female    DOB: 1934/10/01, 78 y.o.   MRN: [id number]  HPI  Here to f/u, overall doing ok but BP has been remarkably elevated since reducing her BP meds including diuretic after episode of orthostatic hypotension requiring IVF in ER about mid November 2013; ACEI was increased after and has had excellent compliance per pt and duaghter, but has experienced severe elev SBP primarily (and often diast such as the 120 today).  Most recently toprol xl 25 mg added, but no real improvement with mult BP at home, and daughter notes HR is elevated as well towards 90-100 but not higher.  Pt denies chest pain, increased sob or doe, wheezing, orthopnea, PND, increased LE swelling, palpitations, dizziness or syncope.  Pt denies new neurological symptoms such as new headache, or facial or extremity weakness or numbness Past Medical History  Diagnosis Date  . Hypertension   . HTN (hypertension) 10/29/2010  . H/O: hysterectomy 10/29/2010  . Chronic constipation 10/29/2010  . Eczema 10/29/2010  . History of cervical cancer 10/29/2010  . Allergic rhinitis, cause unspecified 10/29/2010  . Hyperlipidemia   . Orthostatic hypotension 06/05/2012   Past Surgical History  Procedure Date  . Abdominal hysterectomy 1970  . Breast biopsy 1980's    benign  . Cataract extraction w/ intraocular lens  implant, bilateral 01/2011    reports that she has quit smoking. She has never used smokeless tobacco. She reports that she does not drink alcohol or use illicit drugs. family history includes Hypertension in her father and mother. No Known Allergies Current Outpatient Prescriptions on File Prior to Visit  Medication Sig Dispense Refill  . aspirin 81 MG EC tablet Take 1 tablet (81 mg total) by mouth daily. Swallow whole.  30 tablet  12  . atorvastatin (LIPITOR) 40 MG tablet Take 1 tablet (40 mg total) by mouth at bedtime.  90 tablet  3  . hydrochlorothiazide (HYDRODIURIL) 25 MG tablet Take  1 tablet (25 mg total) by mouth daily.  90 tablet  3   Review of Systems  Constitutional: Negative for diaphoresis and unexpected weight change.  HENT: Negative for tinnitus.   Eyes: Negative for photophobia and visual disturbance.  Respiratory: Negative for choking and stridor.   Gastrointestinal: Negative for vomiting and blood in stool.  Genitourinary: Negative for hematuria and decreased urine volume.  Musculoskeletal: Negative for gait problem.  Skin: Negative for color change and wound.  Neurological: Negative for tremors and numbness.  Psychiatric/Behavioral: Negative for decreased concentration. The patient is not hyperactive.       Objective:   Physical Exam BP 192/120  Pulse 91  Temp 98.6 F (37 C) (Oral)  Ht 5\' 3"  (1.6 m)  Wt 156 lb 6 oz (70.931 kg)  BMI 27.70 kg/m2  SpO2 95% Physical Exam  VS noted Constitutional: Pt appears well-developed and well-nourished.  HENT: Head: Normocephalic.  Right Ear: External ear normal.  Left Ear: External ear normal.  Eyes: Conjunctivae and EOM are normal. Pupils are equal, round, and reactive to light.  Neck: Normal range of motion. Neck supple.  Cardiovascular: Normal rate and regular rhythm.   Pulmonary/Chest: Effort normal and breath sounds normal.  Neurological: Pt is alert. Not confused , RUE mild tremor noted Skin: Skin is warm. No erythema. No LE edema Psychiatric: Pt behavior is normal. Thought content normal.     Assessment & Plan:

## 2012-07-15 NOTE — Assessment & Plan Note (Signed)
Has seen card without indication for further eval, to follow, has been referred neurology as well

## 2012-07-15 NOTE — Assessment & Plan Note (Signed)
Cont's persistent mild today but may improve with increased beta blocker today

## 2012-07-15 NOTE — Assessment & Plan Note (Signed)
Severe uncontrolled, good compliance, but most likely related to reducing BP regimen after orthostatic episode nov 2013 (though cant completely r/o more salt in diet after the holidays); to cont the lisinopril 40 qd (is taking though for some reason is not on her med list today), increase the toprol xl to 50 qd, and re-start HCTZ 25 qd (takes po well);  Will also need f/u BMET in 2 wks, with cont'd excellent monitoring of her VS at home (daughter supportive, will let us know if not working well or new symptoms), then f/u OV here; of note no evidence for secondary problem such as enceophalopathy, CHF or renal issue today

## 2012-07-17 ENCOUNTER — Telehealth: Payer: Self-pay | Admitting: Internal Medicine

## 2012-07-17 NOTE — Telephone Encounter (Signed)
Would cont to monitor for a few more days, but should also make OV later this wk

## 2012-07-17 NOTE — Telephone Encounter (Signed)
Pt's bp is 184 / 106.  She was seen Friday for this also.  What to do?  Also transferring to call a nurse.

## 2012-07-17 NOTE — Telephone Encounter (Signed)
Please advise on medication change if needed

## 2012-07-18 NOTE — Telephone Encounter (Signed)
Called left message to call back 

## 2012-07-18 NOTE — Telephone Encounter (Signed)
Patient informed Anda Latina family member) and agreed to all instructions per MD.  Transferred to scheduler

## 2012-07-21 ENCOUNTER — Other Ambulatory Visit (INDEPENDENT_AMBULATORY_CARE_PROVIDER_SITE_OTHER): Payer: Medicare PPO

## 2012-07-21 ENCOUNTER — Other Ambulatory Visit: Payer: Self-pay | Admitting: Internal Medicine

## 2012-07-21 ENCOUNTER — Encounter: Payer: Self-pay | Admitting: Internal Medicine

## 2012-07-21 ENCOUNTER — Ambulatory Visit (INDEPENDENT_AMBULATORY_CARE_PROVIDER_SITE_OTHER): Payer: Medicare PPO | Admitting: Internal Medicine

## 2012-07-21 VITALS — BP 170/90 | HR 82 | Temp 99.0°F | Ht 63.0 in | Wt 155.5 lb

## 2012-07-21 DIAGNOSIS — I10 Essential (primary) hypertension: Secondary | ICD-10-CM

## 2012-07-21 LAB — BASIC METABOLIC PANEL
CO2: 34 mEq/L — ABNORMAL HIGH (ref 19–32)
Chloride: 94 mEq/L — ABNORMAL LOW (ref 96–112)
Glucose, Bld: 150 mg/dL — ABNORMAL HIGH (ref 70–99)
Potassium: 3 mEq/L — ABNORMAL LOW (ref 3.5–5.1)
Sodium: 136 mEq/L (ref 135–145)

## 2012-07-21 MED ORDER — LABETALOL HCL 300 MG PO TABS: 300.0000 mg | ORAL_TABLET | Freq: Two times a day (BID) | ORAL | Status: DC

## 2012-07-21 MED ORDER — POTASSIUM CHLORIDE ER 10 MEQ PO TBCR: 10.0000 meq | EXTENDED_RELEASE_TABLET | Freq: Every day | ORAL | Status: DC

## 2012-07-21 NOTE — Patient Instructions (Addendum)
Please stop the toprol XL 50 mg per day Take all new medications as prescribed - the labetolol 300 mg twice per day Continue all other medications as before Please continue to monitor your blood pressures as you do Please keep your appointments with your specialists as you have planned - Dr Christella Hartigan on jan 15 and 16 Please go to LAB in the [facility]ement for the blood and/or urine tests to be done today You will be contacted by phone if any changes need to be made immediately.  Otherwise, you will receive a letter about your results with an explanation, but please check with MyChart first

## 2012-07-22 ENCOUNTER — Encounter: Payer: Self-pay | Admitting: Internal Medicine

## 2012-07-22 NOTE — Assessment & Plan Note (Signed)
Improved overall but still uncontrolled in the mod to severe, to change atenolol to labetolol 300 bid, cont max doses of hct and ACE,  to f/u any worsening symptoms or concerns, cont to monitor BP at home, f/u next visit

## 2012-07-22 NOTE — Progress Notes (Signed)
Subjective:    Patient ID: Teresa Riley, female    DOB: 05-Jul-1935, 78 y.o.   MRN: [id number]  HPI  Pt here earlier than expected, as although BP has been improved, it is still elevated and well documented at home per duaghter with her today at SBP's in the 150-160's and DBP occas up to 100.  Was 180/100 this AM at neurology appt.  Overall good compliance with treatment, and good medicine tolerability.  Pt denies chest pain, increased sob or doe, wheezing, orthopnea, PND, increased LE swelling, palpitations, dizziness or syncope.  Pt denies new neurological symptoms such as new headache, or facial or extremity weakness or numbness   Pt denies polydipsia, polyuria.  Pt denies fever, wt loss, night sweats, loss of appetite, or other constitutional symptoms Past Medical History  Diagnosis Date  . Hypertension   . HTN (hypertension) 10/29/2010  . H/O: hysterectomy 10/29/2010  . Chronic constipation 10/29/2010  . Eczema 10/29/2010  . History of cervical cancer 10/29/2010  . Allergic rhinitis, cause unspecified 10/29/2010  . Hyperlipidemia   . Orthostatic hypotension 06/05/2012   Past Surgical History  Procedure Date  . Abdominal hysterectomy 1970  . Breast biopsy 1980's    benign  . Cataract extraction w/ intraocular lens  implant, bilateral 01/2011    reports that she has quit smoking. She has never used smokeless tobacco. She reports that she does not drink alcohol or use illicit drugs. family history includes Hypertension in her father and mother. No Known Allergies Current Outpatient Prescriptions on File Prior to Visit  Medication Sig Dispense Refill  . aspirin 81 MG EC tablet Take 1 tablet (81 mg total) by mouth daily. Swallow whole.  30 tablet  12  . atorvastatin (LIPITOR) 40 MG tablet Take 1 tablet (40 mg total) by mouth at bedtime.  90 tablet  3  . hydrochlorothiazide (HYDRODIURIL) 25 MG tablet Take 1 tablet (25 mg total) by mouth daily.  90 tablet  3  . lisinopril (PRINIVIL,ZESTRIL)  40 MG tablet Take 1 tablet (40 mg total) by mouth daily.  90 tablet  3  . labetalol (NORMODYNE) 300 MG tablet Take 1 tablet (300 mg total) by mouth 2 (two) times daily.  180 tablet  3   Review of Systems  Constitutional: Negative for unexpected weight change, or unusual diaphoresis  HENT: Negative for tinnitus.   Eyes: Negative for photophobia and visual disturbance.  Respiratory: Negative for choking and stridor.   Gastrointestinal: Negative for vomiting and blood in stool.  Genitourinary: Negative for hematuria and decreased urine volume.  Musculoskeletal: Negative for acute joint swelling Skin: Negative for color change and wound.  Neurological: Negative for tremors and numbness other than noted  Psychiatric/Behavioral: Negative for decreased concentration or  hyperactivity.       Objective:   Physical Exam BP 170/90  Pulse 82  Temp 99 F (37.2 C) (Oral)  Ht 5\' 3"  (1.6 m)  Wt 155 lb 8 oz (70.534 kg)  BMI 27.55 kg/m2  SpO2 95% Physical Exam  VS noted,  Constitutional: Pt appears well-developed and well-nourished.  HENT: Head: NCAT.  Right Ear: External ear normal.  Left Ear: External ear normal.  Eyes: Conjunctivae and EOM are normal. Pupils are equal, round, and reactive to light.  Neck: Normal range of motion. Neck supple.  Cardiovascular: Normal rate and regular rhythm.   Pulmonary/Chest: Effort normal and breath sounds normal.  Neurological: Pt is alert. Not confused  Skin: Skin is warm. No erythema.  Psychiatric: Pt  behavior is normal. Thought content normal.     Assessment & Plan:

## 2012-07-25 ENCOUNTER — Telehealth: Payer: Self-pay | Admitting: *Deleted

## 2012-07-25 ENCOUNTER — Telehealth: Payer: Self-pay | Admitting: Gastroenterology

## 2012-07-25 NOTE — Telephone Encounter (Signed)
1700 07-25-2012 patients daughter calling states that her mother has a blood pressure of 198/104, she is lethargic, very tired, her motor functions are "off" and she has a bad headache with increased pressure behind her eyes. Pt has a colon scheduled for 2pm tomorrow with dr Christella Hartigan and daughter wants to know what she should do. Daughter states mom with history of high blood pressure and last week her doctor changed her meds but she has been fine since the change until today and she has felt bad most of the day today.  Daughter instructed to give her mom 2- 81 mg aspirin and to call 911 for transport to ED to rule out possible stoke, daughter gave the ASA while on the phone with me, and will call 911 after hang up with nurse, daughter instructed to notify  [facility] tomorrow with out come. EWM,RN

## 2012-07-25 NOTE — Telephone Encounter (Signed)
Returned call and patient's daughter stated that the patient had eaten scrambled eggs this morning but has been on clear liquids since.  She wanted to know if that would be a problem as her procedure (colonoscopy) is tomorrow.  I explained that as long as she has nothing else to eat solid and she stays on clear liquids she should be fine.  She is to start her prep at 5 pm this evening.  Patient's daughter agreed and understood.  She will be sure patient is aware of this.  All questions were answered.

## 2012-07-26 ENCOUNTER — Ambulatory Visit: Payer: Medicare PPO | Admitting: Internal Medicine

## 2012-07-26 ENCOUNTER — Ambulatory Visit (INDEPENDENT_AMBULATORY_CARE_PROVIDER_SITE_OTHER): Payer: Medicaid Other | Admitting: Internal Medicine

## 2012-07-26 ENCOUNTER — Other Ambulatory Visit: Payer: Medicare PPO | Admitting: Gastroenterology

## 2012-07-26 ENCOUNTER — Other Ambulatory Visit: Payer: Medicare PPO

## 2012-07-26 ENCOUNTER — Other Ambulatory Visit: Payer: Self-pay | Admitting: Neurology

## 2012-07-26 ENCOUNTER — Encounter: Payer: Self-pay | Admitting: Internal Medicine

## 2012-07-26 VITALS — BP 160/98 | HR 72 | Temp 98.0°F | Ht 63.0 in | Wt 156.6 lb

## 2012-07-26 DIAGNOSIS — I10 Essential (primary) hypertension: Secondary | ICD-10-CM

## 2012-07-26 DIAGNOSIS — R413 Other amnesia: Secondary | ICD-10-CM

## 2012-07-26 DIAGNOSIS — E785 Hyperlipidemia, unspecified: Secondary | ICD-10-CM

## 2012-07-26 DIAGNOSIS — R531 Weakness: Secondary | ICD-10-CM

## 2012-07-26 DIAGNOSIS — R5381 Other malaise: Secondary | ICD-10-CM

## 2012-07-26 MED ORDER — HYDROCHLOROTHIAZIDE 25 MG PO TABS: 50.0000 mg | ORAL_TABLET | Freq: Every day | ORAL | Status: DC

## 2012-07-26 NOTE — Patient Instructions (Signed)

## 2012-07-26 NOTE — Telephone Encounter (Signed)
60. Called daughter this am and she states pt is feeling better this am, blood pressure still elevated but is down some, will contact pcp about medicines. States she did call 911 last pm, they did evaluate pt but stated they didn't feel like pt was having a stroke and did not transport her to the ED.  Pt did eat, did not take her prep due to her blood pressure being elevated and daughter didn't want to give her the salty prep with her feeling bad.  Daughter instructed to call office back and reschedule when her mom is feeling better and she thinks can tolerate the prep/procedure. ewm

## 2012-07-26 NOTE — Telephone Encounter (Signed)
Ok, thanks.

## 2012-07-26 NOTE — Progress Notes (Signed)
Subjective:    Patient ID: Teresa Riley, female    DOB: 07-03-1935, 78 y.o.   MRN: [id number]  HPI  Pt presents to the clinic today with c/o hypertension. She was having a headache yesterday and her blood pressure was 190/100. Her daughter called EMS and her blood pressure was 212/106. She did have some difficulty with control of her hands. She was not able to hold a phone. She did feel dizzy. She is on Labetalol, Lisinopril and HCTZ. She had taken all of her meds. She had not eaten anything that contained a lot of salt.  Review of Systems      Past Medical History  Diagnosis Date  . Hypertension   . HTN (hypertension) 10/29/2010  . H/O: hysterectomy 10/29/2010  . Chronic constipation 10/29/2010  . Eczema 10/29/2010  . History of cervical cancer 10/29/2010  . Allergic rhinitis, cause unspecified 10/29/2010  . Hyperlipidemia   . Orthostatic hypotension 06/05/2012    Current Outpatient Prescriptions  Medication Sig Dispense Refill  . aspirin 81 MG EC tablet Take 1 tablet (81 mg total) by mouth daily. Swallow whole.  30 tablet  12  . atorvastatin (LIPITOR) 40 MG tablet Take 1 tablet (40 mg total) by mouth at bedtime.  90 tablet  3  . hydrochlorothiazide (HYDRODIURIL) 25 MG tablet Take 2 tablets (50 mg total) by mouth daily.  90 tablet  3  . labetalol (NORMODYNE) 300 MG tablet Take 1 tablet (300 mg total) by mouth 2 (two) times daily.  180 tablet  3  . lisinopril (PRINIVIL,ZESTRIL) 40 MG tablet Take 1 tablet (40 mg total) by mouth daily.  90 tablet  3  . potassium chloride (KLOR-CON 10) 10 MEQ tablet Take 1 tablet (10 mEq total) by mouth daily.  90 tablet  3    No Known Allergies  Family History  Problem Relation Age of Onset  . Hypertension Mother   . Hypertension Father     History   Social History  . Marital Status: Divorced    Spouse Name: N/A    Number of Children: N/A  . Years of Education: N/A   Occupational History  . Not on file.   Social History Main Topics  .  Smoking status: Former Games developer  . Smokeless tobacco: Never Used  . Alcohol Use: No  . Drug Use: No  . Sexually Active: Not on file   Other Topics Concern  . Not on file   Social History Narrative  . No narrative on file     Constitutional: Denies fever, malaise, fatigue, headache or abrupt weight changes.  HEENT: Denies eye pain, eye redness, ear pain, ringing in the ears, wax buildup, runny nose, nasal congestion, bloody nose, or sore throat. Respiratory: Denies difficulty breathing, shortness of breath, cough or sputum production.   Cardiovascular: Denies chest pain, chest tightness, palpitations or swelling in the hands or feet.  Neurological: Denies dizziness, difficulty with memory, difficulty with speech or problems with balance and coordination.   No other specific complaints in a complete review of systems (except as listed in HPI above).  Objective:   Physical Exam   BP 160/98  Pulse 72  Temp 98 F (36.7 C) (Oral)  Ht 5\' 3"  (1.6 m)  Wt 156 lb 9.6 oz (71.033 kg)  BMI 27.74 kg/m2  SpO2 95% Wt Readings from Last 3 Encounters:  07/26/12 156 lb 9.6 oz (71.033 kg)  07/21/12 155 lb 8 oz (70.534 kg)  07/14/12 156 lb 6 oz (70.931  kg)    General: Appears her stated age, well developed, well nourished in NAD. Cardiovascular: Normal rate and rhythm. S1,S2 noted.  No murmur, rubs or gallops noted. No JVD or BLE edema. No carotid bruits noted. Pulmonary/Chest: Normal effort and positive vesicular breath sounds. No respiratory distress. No wheezes, rales or ronchi noted.  Musculoskeletal: Normal range of motion. No signs of joint swelling. No difficulty with gait.  Neurological: Alert and oriented. Cranial nerves II-XII intact. Coordination normal. +DTRs bilaterally. Psychiatric: Mood and affect normal. Behavior is normal. Judgment and thought content normal.   EKG:  BMET    Component Value Date/Time   NA 136 07/21/2012 1506   K 3.0* 07/21/2012 1506   CL 94* 07/21/2012  1506   CO2 34* 07/21/2012 1506   GLUCOSE 150* 07/21/2012 1506   BUN 14 07/21/2012 1506   CREATININE 1.1 07/21/2012 1506   CALCIUM 9.6 07/21/2012 1506    Lipid Panel     Component Value Date/Time   CHOL 180 06/12/2012 1115   TRIG 124.0 06/12/2012 1115   HDL 68.80 06/12/2012 1115   CHOLHDL 3 06/12/2012 1115   VLDL 24.8 06/12/2012 1115   LDLCALC 86 06/12/2012 1115    CBC    Component Value Date/Time   WBC 6.1 06/12/2012 1115   RBC 4.16 06/12/2012 1115   HGB 13.0 06/12/2012 1115   HCT 38.9 06/12/2012 1115   PLT 258.0 06/12/2012 1115   MCV 93.4 06/12/2012 1115   MCHC 33.5 06/12/2012 1115   RDW 12.9 06/12/2012 1115   LYMPHSABS 2.1 06/12/2012 1115   MONOABS 0.5 06/12/2012 1115   EOSABS 0.1 06/12/2012 1115   BASOSABS 0.0 06/12/2012 1115    Hgb A1C No results found for this basename: HGBA1C        Assessment & Plan:   Hypertension, uncontrolled, additional workup required:  Will increase HCTZ to 50 mg daily Will obtain CT scan of head to r/o TIA given high blood pressure and weakness Continue to monitor blood pressures at home  RTC as needed or if symptoms persist

## 2012-07-29 ENCOUNTER — Ambulatory Visit
Admission: RE | Admit: 2012-07-29 | Discharge: 2012-07-29 | Disposition: A | Discharge status: Home / Self Care | Payer: Medicare HMO | Source: Ambulatory Visit | Attending: Neurology | Admitting: Neurology

## 2012-07-29 DIAGNOSIS — E785 Hyperlipidemia, unspecified: Secondary | ICD-10-CM

## 2012-07-29 DIAGNOSIS — R413 Other amnesia: Secondary | ICD-10-CM

## 2012-07-29 DIAGNOSIS — I10 Essential (primary) hypertension: Secondary | ICD-10-CM

## 2012-07-31 ENCOUNTER — Ambulatory Visit (INDEPENDENT_AMBULATORY_CARE_PROVIDER_SITE_OTHER): Payer: Medicare HMO | Admitting: Internal Medicine

## 2012-07-31 ENCOUNTER — Encounter: Payer: Self-pay | Admitting: Internal Medicine

## 2012-07-31 VITALS — BP 140/90 | HR 72 | Temp 96.8°F | Ht 63.0 in | Wt 153.5 lb

## 2012-07-31 DIAGNOSIS — R51 Headache: Secondary | ICD-10-CM

## 2012-07-31 DIAGNOSIS — E785 Hyperlipidemia, unspecified: Secondary | ICD-10-CM

## 2012-07-31 DIAGNOSIS — I10 Essential (primary) hypertension: Secondary | ICD-10-CM

## 2012-07-31 MED ORDER — ACETAMINOPHEN-CODEINE #3 300-30 MG PO TABS
1.0000 | ORAL_TABLET | ORAL | Status: DC | PRN
Start: 2012-07-31 — End: 2012-08-16

## 2012-07-31 NOTE — Patient Instructions (Addendum)
Please take all new medication as prescribed - the pain medication Please continue all other medications as before Please call your opthomologist today regarding the left eye blurry vision, to be seen as soon as possible We will call for the MRI results that were done just recently, and will try to let you know the results (robin to call) Teresa Riley with your Move to [facility] - Feb 8

## 2012-08-06 ENCOUNTER — Encounter: Payer: Self-pay | Admitting: Internal Medicine

## 2012-08-06 DIAGNOSIS — R519 Headache, unspecified: Secondary | ICD-10-CM | POA: Insufficient documentation

## 2012-08-06 DIAGNOSIS — E785 Hyperlipidemia, unspecified: Secondary | ICD-10-CM | POA: Insufficient documentation

## 2012-08-06 DIAGNOSIS — R51 Headache: Secondary | ICD-10-CM | POA: Insufficient documentation

## 2012-08-06 NOTE — Progress Notes (Signed)
Subjective:    Patient ID: Teresa Riley, female    DOB: 1935/02/24, 78 y.o.   MRN: [id number]  HPI  Here to f/u, has been having > 4 wks bifrontal headaches, sometimes assoc with intermittent blurry vision to the left eye, pain occas severe, with some nausea but no vomiting, not clear if ice-pick like to her or worse with position or exercise.  Nothing seems to make better or worse.  Has meant to see opthomology, but also with more stress recently and in the process of moving to [place] to live with family there feb 8.  CT head jan 15 - no acute. Has been seen per neurology per pt (no data here) and MRI head done jan 20, of which we do not have results as the interpretation was/is to be done per [facility], and not available on EPIC.  Pt denies fever, wt loss, night sweats, loss of appetite, or other constitutional symptoms  Pt denies chest pain, increased sob or doe, wheezing, orthopnea, PND, increased LE swelling, palpitations, dizziness or syncope.   Pt denies polydipsia, polyuria. BP has been labile at home, 199/100 per pt this am, improved here it seems Past Medical History  Diagnosis Date  . Hypertension   . HTN (hypertension) 10/29/2010  . H/O: hysterectomy 10/29/2010  . Chronic constipation 10/29/2010  . Eczema 10/29/2010  . History of cervical cancer 10/29/2010  . Allergic rhinitis, cause unspecified 10/29/2010  . Hyperlipidemia   . Orthostatic hypotension 06/05/2012   Past Surgical History  Procedure Date  . Abdominal hysterectomy 1970  . Breast biopsy 1980's    benign  . Cataract extraction w/ intraocular lens  implant, bilateral 01/2011    reports that she has quit smoking. She has never used smokeless tobacco. She reports that she does not drink alcohol or use illicit drugs. family history includes Hypertension in her father and mother. No Known Allergies Current Outpatient Prescriptions on File Prior to Visit  Medication Sig Dispense Refill  . aspirin 81 MG EC tablet  Take 1 tablet (81 mg total) by mouth daily. Swallow whole.  30 tablet  12  . atorvastatin (LIPITOR) 40 MG tablet Take 1 tablet (40 mg total) by mouth at bedtime.  90 tablet  3  . hydrochlorothiazide (HYDRODIURIL) 25 MG tablet Take 2 tablets (50 mg total) by mouth daily.  90 tablet  3  . labetalol (NORMODYNE) 300 MG tablet Take 1 tablet (300 mg total) by mouth 2 (two) times daily.  180 tablet  3  . lisinopril (PRINIVIL,ZESTRIL) 40 MG tablet Take 1 tablet (40 mg total) by mouth daily.  90 tablet  3  . potassium chloride (KLOR-CON 10) 10 MEQ tablet Take 1 tablet (10 mEq total) by mouth daily.  90 tablet  3   Review of Systems  Constitutional: Negative for unexpected weight change, or unusual diaphoresis  HENT: Negative for tinnitus.  Marland Kitchen  Respiratory: Negative for choking and stridor.   Gastrointestinal: Negative for vomiting and blood in stool.  Genitourinary: Negative for hematuria and decreased urine volume.  Musculoskeletal: Negative for acute joint swelling Skin: Negative for color change and wound.  Neurological: Negative for tremors and numbness other than noted  Psychiatric/Behavioral: Negative for decreased concentration or  hyperactivity.       Objective:   Physical Exam BP 140/90  Pulse 72  Temp 96.8 F (36 C) (Oral)  Ht 5\' 3"  (1.6 m)  Wt 153 lb 8 oz (69.627 kg)  BMI 27.19 kg/m2  SpO2  97% VS noted, nonttoxic/not ill appearing Constitutional: Pt appears well-developed and well-nourished.  HENT: Head: NCAT.  Right Ear: External ear normal.  Left Ear: External ear normal.  Eyes: Conjunctivae and EOM are normal. Pupils are equal, round, and reactive to light.  Neck: Normal range of motion. Neck supple.  Cardiovascular: Normal rate and regular rhythm.   Pulmonary/Chest: Effort normal and breath sounds normal.  Abd:  Soft, NT, non-distended, + BS Neurological: Pt is alert. Not confused, cn 2-12 intact, discs flat to my exam , motor/gait intact Skin: Skin is warm. No erythema.    Psychiatric: Pt behavior is normal. Thought content normal.     Assessment & Plan:

## 2012-08-06 NOTE — Assessment & Plan Note (Signed)
stable overall by exam, recent data reviewed with pt, and pt to continue medical treatment as before,  to f/u any worsening symptoms or concerns BP Readings from Last 3 Encounters:  07/31/12 140/90  07/26/12 160/98  07/21/12 170/90

## 2012-08-06 NOTE — Assessment & Plan Note (Signed)
Etiology unclear, exam without new changes, gave tylenoll #3 prn for pain, but will ask for MRI results per neurology with further eval/tx pending MRI and neurology consult results

## 2012-08-06 NOTE — Assessment & Plan Note (Signed)
stable overall by history and exam, recent data reviewed with pt, and pt to continue medical treatment as before,  to f/u any worsening symptoms or concerns Lab Results  Component Value Date   LDLCALC 86 06/12/2012

## 2012-08-16 ENCOUNTER — Encounter (HOSPITAL_COMMUNITY): Payer: Self-pay | Admitting: Emergency Medicine

## 2012-08-16 ENCOUNTER — Encounter: Payer: Self-pay | Admitting: Internal Medicine

## 2012-08-16 ENCOUNTER — Ambulatory Visit (INDEPENDENT_AMBULATORY_CARE_PROVIDER_SITE_OTHER): Payer: Medicare HMO | Admitting: Internal Medicine

## 2012-08-16 ENCOUNTER — Emergency Department (HOSPITAL_COMMUNITY)
Admission: EM | Admit: 2012-08-16 | Discharge: 2012-08-16 | Disposition: A | Discharge status: Home / Self Care | Payer: Medicare HMO | Attending: Emergency Medicine | Admitting: Emergency Medicine

## 2012-08-16 VITALS — BP 120/78 | HR 75 | Temp 98.0°F | Ht 63.0 in | Wt 155.0 lb

## 2012-08-16 DIAGNOSIS — J309 Allergic rhinitis, unspecified: Secondary | ICD-10-CM | POA: Insufficient documentation

## 2012-08-16 DIAGNOSIS — R7302 Impaired glucose tolerance (oral): Secondary | ICD-10-CM

## 2012-08-16 DIAGNOSIS — R569 Unspecified convulsions: Secondary | ICD-10-CM | POA: Insufficient documentation

## 2012-08-16 DIAGNOSIS — E876 Hypokalemia: Secondary | ICD-10-CM

## 2012-08-16 DIAGNOSIS — Z79899 Other long term (current) drug therapy: Secondary | ICD-10-CM | POA: Insufficient documentation

## 2012-08-16 DIAGNOSIS — Z8719 Personal history of other diseases of the digestive system: Secondary | ICD-10-CM | POA: Insufficient documentation

## 2012-08-16 DIAGNOSIS — Z872 Personal history of diseases of the skin and subcutaneous tissue: Secondary | ICD-10-CM | POA: Insufficient documentation

## 2012-08-16 DIAGNOSIS — B370 Candidal stomatitis: Secondary | ICD-10-CM | POA: Insufficient documentation

## 2012-08-16 DIAGNOSIS — I10 Essential (primary) hypertension: Secondary | ICD-10-CM

## 2012-08-16 DIAGNOSIS — Z7982 Long term (current) use of aspirin: Secondary | ICD-10-CM | POA: Insufficient documentation

## 2012-08-16 DIAGNOSIS — Z8679 Personal history of other diseases of the circulatory system: Secondary | ICD-10-CM | POA: Insufficient documentation

## 2012-08-16 DIAGNOSIS — Z8541 Personal history of malignant neoplasm of cervix uteri: Secondary | ICD-10-CM | POA: Insufficient documentation

## 2012-08-16 DIAGNOSIS — E785 Hyperlipidemia, unspecified: Secondary | ICD-10-CM | POA: Insufficient documentation

## 2012-08-16 DIAGNOSIS — G40909 Epilepsy, unspecified, not intractable, without status epilepticus: Secondary | ICD-10-CM | POA: Insufficient documentation

## 2012-08-16 DIAGNOSIS — Z87891 Personal history of nicotine dependence: Secondary | ICD-10-CM | POA: Insufficient documentation

## 2012-08-16 DIAGNOSIS — R7309 Other abnormal glucose: Secondary | ICD-10-CM

## 2012-08-16 HISTORY — DX: Impaired glucose tolerance (oral): R73.02

## 2012-08-16 LAB — CBC WITH DIFFERENTIAL/PLATELET
Basophils Absolute: 0 10*3/uL (ref 0.0–0.1)
Basophils Relative: 0 % (ref 0–1)
HCT: 35.3 % — ABNORMAL LOW (ref 36.0–46.0)
Hemoglobin: 12.4 g/dL (ref 12.0–15.0)
Lymphocytes Relative: 26 % (ref 12–46)
Monocytes Absolute: 0.4 10*3/uL (ref 0.1–1.0)
Monocytes Relative: 8 % (ref 3–12)
Neutro Abs: 3.1 10*3/uL (ref 1.7–7.7)
Neutrophils Relative %: 66 % (ref 43–77)
RDW: 11.9 % (ref 11.5–15.5)
WBC: 4.7 10*3/uL (ref 4.0–10.5)

## 2012-08-16 LAB — BASIC METABOLIC PANEL
CO2: 28 mEq/L (ref 19–32)
Chloride: 91 mEq/L — ABNORMAL LOW (ref 96–112)
Creatinine, Ser: 0.92 mg/dL (ref 0.50–1.10)
GFR calc Af Amer: 69 mL/min — ABNORMAL LOW (ref >90)
Potassium: 3 mEq/L — ABNORMAL LOW (ref 3.5–5.1)

## 2012-08-16 LAB — URINALYSIS, ROUTINE W REFLEX MICROSCOPIC
Glucose, UA: NEGATIVE mg/dL
Ketones, ur: NEGATIVE mg/dL
Leukocytes, UA: NEGATIVE
Nitrite: NEGATIVE
Specific Gravity, Urine: 1.011 (ref 1.005–1.030)
pH: 7 (ref 5.0–8.0)

## 2012-08-16 LAB — MAGNESIUM: Magnesium: 2.1 mg/dL (ref 1.5–2.5)

## 2012-08-16 LAB — GLUCOSE, CAPILLARY: Glucose-Capillary: 104 mg/dL — ABNORMAL HIGH (ref 70–99)

## 2012-08-16 MED ORDER — POTASSIUM CHLORIDE CRYS ER 20 MEQ PO TBCR
60.0000 meq | EXTENDED_RELEASE_TABLET | Freq: Once | ORAL | Status: AC
  Administered 2012-08-16: 60 meq via ORAL
  Filled 2012-08-16: qty 3

## 2012-08-16 MED ORDER — SODIUM CHLORIDE 0.9 % IV BOLUS (SEPSIS)
1000.0000 mL | Freq: Once | INTRAVENOUS | Status: AC
  Administered 2012-08-16: 1000 mL via INTRAVENOUS

## 2012-08-16 MED ORDER — LEVETIRACETAM 500 MG PO TABS
500.0000 mg | ORAL_TABLET | Freq: Once | ORAL | Status: AC
  Administered 2012-08-16: 500 mg via ORAL
  Filled 2012-08-16: qty 1

## 2012-08-16 MED ORDER — LEVETIRACETAM 500 MG PO TABS: 500.0000 mg | ORAL_TABLET | Freq: Two times a day (BID) | ORAL | Status: DC

## 2012-08-16 MED ORDER — NYSTATIN 100000 UNIT/ML MT SUSP: 500000.0000 [IU] | Freq: Four times a day (QID) | OROMUCOSAL | Status: DC

## 2012-08-16 NOTE — Assessment & Plan Note (Signed)
For f/u lab today 

## 2012-08-16 NOTE — Patient Instructions (Addendum)
Please continue all other medications as before, and refills have been done if requested. It appears you had a new siezure today You should be taken to the ER for further evaluation.

## 2012-08-16 NOTE — ED Notes (Signed)
MD at bedside. 

## 2012-08-16 NOTE — ED Notes (Signed)
CBG 104 

## 2012-08-16 NOTE — ED Notes (Signed)
Care transferred and report given to [facility], [place]

## 2012-08-16 NOTE — ED Notes (Signed)
Pt is alert and oriented to normal no distress noted.  Resp symmetrical and unlabored. Skin warm and dry.  No neuro deficits noted.  Family at the bedside.

## 2012-08-16 NOTE — ED Provider Notes (Signed)
History     CSN: [id number]  Arrival date & time 08/16/12  1036   First MD Initiated Contact with Patient 08/16/12 1146      Chief Complaint  Patient presents with  . Seizures    (Consider location/radiation/quality/duration/timing/severity/associated sxs/prior treatment) HPI Comments: PT comes in with cc of seizure. Pt has hx of HTN, HL, no hx of seizures. She was at her PCP, and was noted to have about 5 minute episode of blank stare - and twitching. The episode was witnessed by the PCP. Pt snapped out of the episode, and was alert and oriented - and was requested to come to the ER. She had no incontinecne, and no headaches. Daughter reports that she has noticed a couple of these episodes in the past, and at one point they thought she had passed out and brought her to the ER - and patient received syncope workup. No hx of TBI, seizures at any age. She had a CT and MRI just 2 weeks ago, and they were normal.  Patient is a 78 y.o. female presenting with seizures. The history is provided by the patient.  Seizures  Pertinent negatives include no confusion, no speech difficulty, no chest pain, no cough, no nausea, no vomiting and no diarrhea.    Past Medical History  Diagnosis Date  . Hypertension   . HTN (hypertension) 10/29/2010  . H/O: hysterectomy 10/29/2010  . Chronic constipation 10/29/2010  . Eczema 10/29/2010  . History of cervical cancer 10/29/2010  . Allergic rhinitis, cause unspecified 10/29/2010  . Hyperlipidemia   . Orthostatic hypotension 06/05/2012  . Impaired glucose tolerance 08/16/2012    Past Surgical History  Procedure Date  . Abdominal hysterectomy 1970  . Breast biopsy 1980's    benign  . Cataract extraction w/ intraocular lens  implant, bilateral 01/2011  . Eye surgery     Family History  Problem Relation Age of Onset  . Hypertension Mother   . Hypertension Father     History  Substance Use Topics  . Smoking status: Former Games developer  . Smokeless  tobacco: Never Used  . Alcohol Use: No    OB History    Grav Para Term Preterm Abortions TAB SAB Ect Mult Living                  Review of Systems  Constitutional: Negative for activity change.  HENT: Negative for facial swelling and neck pain.   Respiratory: Negative for cough, shortness of breath and wheezing.   Cardiovascular: Negative for chest pain.  Gastrointestinal: Negative for nausea, vomiting, abdominal pain, diarrhea, constipation, blood in stool and abdominal distention.  Genitourinary: Negative for hematuria and difficulty urinating.  Skin: Negative for color change.  Neurological: Positive for seizures. Negative for syncope and speech difficulty.  Hematological: Does not bruise/bleed easily.  Psychiatric/Behavioral: Negative for confusion.    Allergies  Review of patient's allergies indicates no known allergies.  Home Medications   Current Outpatient Rx  Name  Route  Sig  Dispense  Refill  . ASPIRIN 81 MG PO TBEC   Oral   Take 1 tablet (81 mg total) by mouth daily. Swallow whole.   30 tablet   12   . HYDROCHLOROTHIAZIDE 25 MG PO TABS   Oral   Take 2 tablets (50 mg total) by mouth daily.   90 tablet   3   . LABETALOL HCL 300 MG PO TABS   Oral   Take 1 tablet (300 mg total) by mouth 2 (  two) times daily.   180 tablet   3   . LISINOPRIL 40 MG PO TABS   Oral   Take 1 tablet (40 mg total) by mouth daily.   90 tablet   3   . SYSTANE OP   Both Eyes   Place 2-4 drops into both eyes 4 (four) times daily.         Marland Kitchen POTASSIUM CHLORIDE ER 10 MEQ PO TBCR   Oral   Take 1 tablet (10 mEq total) by mouth daily.   90 tablet   3   . ATORVASTATIN CALCIUM 40 MG PO TABS   Oral   Take 1 tablet (40 mg total) by mouth at bedtime.   90 tablet   3   . NYSTATIN 100000 UNIT/ML MT SUSP   Oral   Take 5 mLs (500,000 Units total) by mouth 4 (four) times daily.   60 mL   1     BP 163/79  Pulse 59  Temp 98.3 F (36.8 C) (Oral)  Resp 18  Ht 5\' 3"  (1.6 m)   Wt 155 lb (70.308 kg)  BMI 27.46 kg/m2  SpO2 100%  Physical Exam  Nursing note and vitals reviewed. Constitutional: She is oriented to person, place, and time. She appears well-developed.  HENT:  Head: Normocephalic and atraumatic.  Eyes: Conjunctivae normal and EOM are normal. Pupils are equal, round, and reactive to light.  Neck: Normal range of motion. Neck supple.  Cardiovascular: Normal rate, regular rhythm and normal heart sounds.   Pulmonary/Chest: Effort normal and breath sounds normal. No respiratory distress.  Abdominal: Soft. Bowel sounds are normal. She exhibits no distension. There is no tenderness. There is no rebound and no guarding.  Musculoskeletal: Normal range of motion. She exhibits no edema and no tenderness.  Neurological: She is alert and oriented to person, place, and time.  Skin: Skin is warm and dry.    ED Course  Procedures (including critical care time)  Labs Reviewed  GLUCOSE, CAPILLARY - Abnormal; Notable for the following:    Glucose-Capillary 104 (*)     All other components within normal limits  CBC WITH DIFFERENTIAL - Abnormal; Notable for the following:    HCT 35.3 (*)     All other components within normal limits  BASIC METABOLIC PANEL - Abnormal; Notable for the following:    Sodium 130 (*)     Potassium 3.0 (*)     Chloride 91 (*)     Glucose, Bld 114 (*)     GFR calc non Af Amer 59 (*)     GFR calc Af Amer 69 (*)     All other components within normal limits  MAGNESIUM  URINALYSIS, ROUTINE W REFLEX MICROSCOPIC   No results found.   No diagnosis found.    MDM  DDx: -Seizure disorder -Meningitis -Trauma -ICH -Electrolyte abnormality -Metabolic derangement -Stroke -Toxin induced seizures -Medication side effects -Hypoxia -Hypoglycemia   Pt comes in with cc of seizure. This appears to be 2nd episode. I spoke with Neurologist on call, Dr. Roseanne Reno, and he recommends starting patient on Keppra 500 mg bid.  Sheh as  seen Neurologist recently, so we will ask her to continue [facility] f/u.  Derwood Kaplan, MD 08/16/12 1535

## 2012-08-16 NOTE — Progress Notes (Signed)
Subjective:    Patient ID: Teresa Riley, female    DOB: [birth year], 78 y.o.   MRN: [id number]  HPI  Pt here to f/u, overall doing ok, except noted decreased taste and appetitie since last visit with ? Coating to the tongue. No wt loss documented.  Left eye pain essentially resolved per pt since last visit, did not take much of the tylenol #3 due to causing nausea.  Pt denies chest pain, increased sob or doe, wheezing, orthopnea, PND, increased LE swelling, palpitations, dizziness or syncope.  Pt denies new neurological symptoms such as new headache, or facial or extremity weakness or numbness   Pt denies polydipsia, polyuria. Did have mild low K and elev glc with last labs jan 2014 - new for her.  Overall good compliance with treatment, and good medicine tolerability, including the K rx.  Recent Head MRI neg for stroke/tumor/acute.  Still planning to move to [facility] to live with other family for some time, but may come back here for some care per daughter. At end visit - pt with apparent siezure (see discussion below)   Past Medical History  Diagnosis Date  . Hypertension   . HTN (hypertension) 10/29/2010  . H/O: hysterectomy 10/29/2010  . Chronic constipation 10/29/2010  . Eczema 10/29/2010  . History of cervical cancer 10/29/2010  . Allergic rhinitis, cause unspecified 10/29/2010  . Hyperlipidemia   . Orthostatic hypotension 06/05/2012  . Impaired glucose tolerance 08/16/2012   Past Surgical History  Procedure Date  . Abdominal hysterectomy 1970  . Breast biopsy 1980's    benign  . Cataract extraction w/ intraocular lens  implant, bilateral 01/2011    reports that she has quit smoking. She has never used smokeless tobacco. She reports that she does not drink alcohol or use illicit drugs. family history includes Hypertension in her father and mother. No Known Allergies Current Outpatient Prescriptions on File Prior to Visit  Medication Sig Dispense Refill  . aspirin 81 MG EC tablet Take 1  tablet (81 mg total) by mouth daily. Swallow whole.  30 tablet  12  . atorvastatin (LIPITOR) 40 MG tablet Take 1 tablet (40 mg total) by mouth at bedtime.  90 tablet  3  . hydrochlorothiazide (HYDRODIURIL) 25 MG tablet Take 2 tablets (50 mg total) by mouth daily.  90 tablet  3  . labetalol (NORMODYNE) 300 MG tablet Take 1 tablet (300 mg total) by mouth 2 (two) times daily.  180 tablet  3  . lisinopril (PRINIVIL,ZESTRIL) 40 MG tablet Take 1 tablet (40 mg total) by mouth daily.  90 tablet  3  . potassium chloride (KLOR-CON 10) 10 MEQ tablet Take 1 tablet (10 mEq total) by mouth daily.  90 tablet  3   Review of Systems  Constitutional: Negative for unexpected weight change, or unusual diaphoresis  HENT: Negative for tinnitus.   Eyes: Negative for photophobia and visual disturbance.  Respiratory: Negative for choking and stridor.   Gastrointestinal: Negative for vomiting and blood in stool.  Genitourinary: Negative for hematuria and decreased urine volume.  Musculoskeletal: Negative for acute joint swelling Skin: Negative for color change and wound.  Neurological: Negative for tremors and numbness other than noted  Psychiatric/Behavioral: Negative for decreased concentration or  hyperactivity.      Objective:   Physical Exam BP 120/78  Pulse 75  Temp 98 F (36.7 C) (Oral)  Ht 5\' 3"  (1.6 m)  Wt 155 lb (70.308 kg)  BMI 27.46 kg/m2  SpO2 95% VS  noted,  Constitutional: Pt appears well-developed and well-nourished.  HENT: Head: NCAT.  Right Ear: External ear normal.  Left Ear: External ear normal.  Eyes: Conjunctivae and EOM are normal. Pupils are equal, round, and reactive to light.  Neck: Normal range of motion. Neck supple.  Cardiovascular: Normal rate and regular rhythm.   Pulmonary/Chest: Effort normal and breath sounds normal.  Abd:  Soft, NT, non-distended, + BS Neurological: Pt is alert. Not confused  Skin: Skin is warm. No erythema.  Psychiatric: Pt behavior is normal.  Thought content normal.     Assessment & Plan:

## 2012-08-16 NOTE — Assessment & Plan Note (Addendum)
Improved overall and daughter quite pleased, Ok for f/u K today with low K recent liekly due to HCT -, Please continue all other medications as before

## 2012-08-16 NOTE — ED Notes (Signed)
Per EMS and patient report, patient had a witnessed seizure at her doctor's office this morning approximately 0930.  Patient does not have a history of seizures.  The seizure was described as upper extremity involvement only that lasted 2 - 3 minutes.   Patient resolved quickly and is A & O x 4.

## 2012-08-16 NOTE — Assessment & Plan Note (Signed)
Asympt, not likely clinically signficant, to check a1c

## 2012-08-16 NOTE — Assessment & Plan Note (Addendum)
While I was finishing the documentation for her visit today, pt was sitting waiting in the chair in the exam room next to daughter; daughter noted suddenly uncommunicaitive/staring and unresponsive to verbal commands, eyelids drooped and tongue seemed to protrude, seemed to slump to the left in the chair somewhat with head turned leftwards, and I observed slight jerking movements of the upper extremities; pulse somewhat weak with BP sitting during the attack approx 70 sbp (though not clear how accurate this was) and PERRLA, then pt laid on exam table with f/u BP (felt to be accurate of sbp 140).  Pt became alert and responsive after without specific complaint except weakness with total time of episode approx 3 minutes.  Pt to be tranported to ER for further eval and tx.   Note:  Total time for pt hx, exam, review of record with pt in the room, determination of diagnoses and plan for further eval and tx is > 40 min, with over 50% spent in coordination and counseling of patient and daughter in light of episode above

## 2012-08-16 NOTE — Assessment & Plan Note (Signed)
Ok for nystatin oral asd,  to f/u any worsening symptoms or concerns

## 2012-09-11 ENCOUNTER — Telehealth: Payer: Self-pay | Admitting: General Practice

## 2012-09-11 DIAGNOSIS — I10 Essential (primary) hypertension: Secondary | ICD-10-CM

## 2012-09-11 MED ORDER — HYDROCHLOROTHIAZIDE 25 MG PO TABS: 25.0000 mg | ORAL_TABLET | Freq: Every day | ORAL | Status: DC

## 2012-09-11 NOTE — Telephone Encounter (Signed)
Ok to simply use toothbrush for the tongue for now  OK for only 1 hct per day (rx corrected)

## 2012-09-11 NOTE — Telephone Encounter (Signed)
Pt daughter notified   

## 2012-09-11 NOTE — Telephone Encounter (Signed)
Pt daughter called stating that she took meds for thrush and refill. Now her tongue is black, and feels fuzzy.   Please advise as to when she needs a follow up. HCTZ was filled for #90 with 3 refills. Should this have been #180?

## 2012-10-02 ENCOUNTER — Telehealth: Payer: Self-pay | Admitting: Neurology

## 2012-10-02 MED ORDER — LEVETIRACETAM 750 MG PO TABS: 750.0000 mg | ORAL_TABLET | Freq: Two times a day (BID) | ORAL | Status: DC

## 2012-10-02 NOTE — Telephone Encounter (Signed)
I have called her daughter Teresa Riley, patient phone number is [phone]. Sister No [phone]. Her name is Teresa Riley,  She had seizure March 22nd, she got to the bottom of stairs, blank stare, unresponsiveness lasting 1-2 minutes, she was drowsy afterwards, 911 was called, EKG showed some "electrical blockage", not a concern. She is compling with  keppra 500mg  bid. Will call in Keppra 750mg  bid.

## 2012-11-15 ENCOUNTER — Ambulatory Visit (INDEPENDENT_AMBULATORY_CARE_PROVIDER_SITE_OTHER): Payer: Medicare PPO | Admitting: Nurse Practitioner

## 2012-11-15 ENCOUNTER — Encounter: Payer: Self-pay | Admitting: Nurse Practitioner

## 2012-11-15 VITALS — BP 158/85 | HR 61 | Ht 63.0 in | Wt 151.0 lb

## 2012-11-15 DIAGNOSIS — R413 Other amnesia: Secondary | ICD-10-CM

## 2012-11-15 DIAGNOSIS — F09 Unspecified mental disorder due to known physiological condition: Secondary | ICD-10-CM | POA: Insufficient documentation

## 2012-11-15 DIAGNOSIS — F0390 Unspecified dementia without behavioral disturbance: Secondary | ICD-10-CM | POA: Insufficient documentation

## 2012-11-15 DIAGNOSIS — G40109 Localization-related (focal) (partial) symptomatic epilepsy and epileptic syndromes with simple partial seizures, not intractable, without status epilepticus: Secondary | ICD-10-CM

## 2012-11-15 MED ORDER — DONEPEZIL HCL 5 MG PO TABS: 5.0000 mg | ORAL_TABLET | Freq: Every morning | ORAL | Status: DC

## 2012-11-15 NOTE — Patient Instructions (Addendum)
Aricept 5 mg daily for one month then increase to 10 mg Constipation  -1 cup of bran, 1 cup of applesauce in 1 cup of prune juice, mix together, makes a paste 2.  Increase fiber intake (Metamucil,vegetables) 3.  Regular, moderate exercise can be beneficial. 4.  Avoid medications causing constipation, such as medications like antacids with calcium or magnesium 5.  Laxative overuse should be avoided. 6.  Stool softeners (Colace) can help with chronic constipation. Exercise by walking daily Followup in 6 months

## 2012-11-15 NOTE — Progress Notes (Signed)
HPI: Patient returns for followup after last visit with Dr. Terrace Arabia 08/18/2012. She has a history of seizure disorder with additional seizure occurring in February. EEG showed slowing. She also has a markedly abnormal MRI most suggestive of a vascular dementia. She is not on any memory medication now. She is currently taking Keppra 750 twice a day with no further seizure activity. She lives with one of her daughters for one month and then switches to the other daughter. There are been no behavior issues, sleeps well at night appetite is good. She is walking for exercise  ROS:  Memory loss  Physical Exam General: well developed, well nourished, seated, in no evident distress Head: head normocephalic and atraumatic. Oropharynx benign Neck: supple with no carotid or supraclavicular bruits Cardiovascular: regular rate and rhythm, no murmurs  Neurologic Exam' Mental Status: Awake and  alert. MMSE 28/30. Missing today's date and  1 recall item AFT 15.   Cranial Nerves: Pupils equal, briskly reactive to light. Extraocular movements full without nystagmus. Visual fields full to confrontation. Hearing intact and symmetric to finger snap. Facial sensation intact. Face, tongue, palate move normally and symmetrically. Neck flexion and extension normal.  Motor: Normal bulk and tone. Normal strength in all tested extremity muscles. Sensory.: intact to touch and pinprick and vibratory.  Coordination: Rapid alternating movements normal in all extremities. Finger-to-nose and heel-to-shin performed accurately bilaterally. Gait and Station: Arises from chair without difficulty. Stance is normal. Gait demonstrates normal stride length and balance . Able to heel, toe and unsteady with tandem. No assistive device  Reflexes: 2+ and symmetric. Toes downgoing.     ASSESSMENT: Vascular dementia,  complex partial seizure disorder. Her  recent EEG in February with slowing. Most recent MRI with extensive atrophy and  periventricular white matter disease. Dr. Terrace Arabia has  Talked  with daughters about placing patient on Aricept. Chronic constipation.    PLAN: Aricept 5 mg daily for one month then increase to 10 mg, will need to call for new rx For chronic constipation  -1 cup of bran, 1 cup of applesauce in 1 cup of prune juice, mix together, makes a paste 2.  Increase fiber intake (fruits,vegetables) 3.  Regular, moderate exercise can be beneficial. 4.  Avoid medications causing constipation, such as medications like antacids with calcium or magnesium 5.  Laxative overuse should be avoided. 6.  Stool softeners (Colace) can help with chronic constipation 7. Increase fluid intake Exercise by walking daily Followup in 6 months Referred to [facility] for memory trial.   Nilda Riggs, GNP-BC APRN

## 2012-11-28 ENCOUNTER — Telehealth: Payer: Self-pay

## 2012-11-28 MED ORDER — LEVETIRACETAM 1000 MG PO TABS: 1000.0000 mg | ORAL_TABLET | Freq: Two times a day (BID) | ORAL | Status: DC

## 2012-11-28 NOTE — Telephone Encounter (Signed)
I have called her daughter [name], patient had seizure Saturday, she is taking keppra 750mg  bid, she is compliance with her meds.  I will call in keppra 1000mg  bid.

## 2012-11-28 NOTE — Telephone Encounter (Signed)
I have called twice and left message.

## 2012-11-28 NOTE — Telephone Encounter (Signed)
Patient daughter(Freda)  called and states had a Seizure on Saturday. Seizure lasted four mins. Patient is still really weak strength is coming back slowly . Still taking Keppra 750 mg bid.

## 2012-11-28 NOTE — Telephone Encounter (Signed)
[name] calling back [phone] sorry she missed your call. Please ask Dr.Yan to call back.

## 2012-12-05 ENCOUNTER — Encounter: Payer: Self-pay | Admitting: Internal Medicine

## 2012-12-05 ENCOUNTER — Other Ambulatory Visit (INDEPENDENT_AMBULATORY_CARE_PROVIDER_SITE_OTHER): Payer: Medicare HMO

## 2012-12-05 ENCOUNTER — Other Ambulatory Visit: Payer: Self-pay | Admitting: Internal Medicine

## 2012-12-05 ENCOUNTER — Ambulatory Visit (INDEPENDENT_AMBULATORY_CARE_PROVIDER_SITE_OTHER): Payer: Medicare HMO | Admitting: Internal Medicine

## 2012-12-05 VITALS — BP 142/88 | HR 63 | Temp 97.9°F | Ht 63.0 in | Wt 148.2 lb

## 2012-12-05 DIAGNOSIS — J341 Cyst and mucocele of nose and nasal sinus: Secondary | ICD-10-CM

## 2012-12-05 DIAGNOSIS — I10 Essential (primary) hypertension: Secondary | ICD-10-CM

## 2012-12-05 DIAGNOSIS — Z0000 Encounter for general adult medical examination without abnormal findings: Secondary | ICD-10-CM

## 2012-12-05 DIAGNOSIS — J3489 Other specified disorders of nose and nasal sinuses: Secondary | ICD-10-CM

## 2012-12-05 LAB — URINALYSIS, ROUTINE W REFLEX MICROSCOPIC
Leukocytes, UA: NEGATIVE
Specific Gravity, Urine: 1.025 (ref 1.000–1.030)
Urine Glucose: NEGATIVE
Urobilinogen, UA: 0.2 (ref 0.0–1.0)
pH: 6 (ref 5.0–8.0)

## 2012-12-05 LAB — CBC WITH DIFFERENTIAL/PLATELET
Basophils Absolute: 0 10*3/uL (ref 0.0–0.1)
Eosinophils Relative: 1.3 % (ref 0.0–5.0)
HCT: 34.7 % — ABNORMAL LOW (ref 36.0–46.0)
Hemoglobin: 11.9 g/dL — ABNORMAL LOW (ref 12.0–15.0)
Lymphocytes Relative: 47.2 % — ABNORMAL HIGH (ref 12.0–46.0)
Lymphs Abs: 2.4 10*3/uL (ref 0.7–4.0)
Monocytes Relative: 8.3 % (ref 3.0–12.0)
Neutro Abs: 2.2 10*3/uL (ref 1.4–7.7)
RDW: 12.9 % (ref 11.5–14.6)
WBC: 5.2 10*3/uL (ref 4.5–10.5)

## 2012-12-05 LAB — BASIC METABOLIC PANEL
BUN: 15 mg/dL (ref 6–23)
CO2: 31 mEq/L (ref 19–32)
Calcium: 9.6 mg/dL (ref 8.4–10.5)
Creatinine, Ser: 1 mg/dL (ref 0.4–1.2)
GFR: 70.15 mL/min (ref >60.00)
Glucose, Bld: 99 mg/dL (ref 70–99)

## 2012-12-05 LAB — HEPATIC FUNCTION PANEL
Albumin: 3.6 g/dL (ref 3.5–5.2)
Alkaline Phosphatase: 63 U/L (ref 39–117)
Total Protein: 7.3 g/dL (ref 6.0–8.3)

## 2012-12-05 LAB — TSH: TSH: 0.97 u[IU]/mL (ref 0.35–5.50)

## 2012-12-05 LAB — LIPID PANEL
Cholesterol: 281 mg/dL — ABNORMAL HIGH (ref 0–200)
HDL: 82.3 mg/dL (ref >39.00)
Triglycerides: 138 mg/dL (ref 0.0–149.0)

## 2012-12-05 MED ORDER — POTASSIUM CHLORIDE CRYS ER 10 MEQ PO TBCR: 10.0000 meq | EXTENDED_RELEASE_TABLET | Freq: Every day | ORAL | Status: DC

## 2012-12-05 MED ORDER — POTASSIUM CHLORIDE CRYS ER 10 MEQ PO TBCR: 20.0000 meq | EXTENDED_RELEASE_TABLET | Freq: Every day | ORAL | Status: DC

## 2012-12-05 MED ORDER — DONEPEZIL HCL 10 MG PO TABS: 10.0000 mg | ORAL_TABLET | Freq: Every evening | ORAL | Status: DC | PRN

## 2012-12-05 MED ORDER — ATORVASTATIN CALCIUM 10 MG PO TABS: 10.0000 mg | ORAL_TABLET | Freq: Every day | ORAL | Status: DC

## 2012-12-05 MED ORDER — LISINOPRIL 40 MG PO TABS: 40.0000 mg | ORAL_TABLET | Freq: Every day | ORAL | Status: DC

## 2012-12-05 MED ORDER — LABETALOL HCL 300 MG PO TABS: 300.0000 mg | ORAL_TABLET | Freq: Two times a day (BID) | ORAL | Status: DC

## 2012-12-05 MED ORDER — HYDROCHLOROTHIAZIDE 25 MG PO TABS: 25.0000 mg | ORAL_TABLET | Freq: Every day | ORAL | Status: DC

## 2012-12-05 NOTE — Progress Notes (Signed)
Subjective:    Patient ID: Teresa Riley, female    DOB: April 22, 1935, 78 y.o.   MRN: [id number]  HPI  Here for wellness and f/u with 2 daughters;  Overall doing ok;  Pt denies CP, worsening SOB, DOE, wheezing, orthopnea, PND, worsening LE edema, palpitations, dizziness or syncope.  Pt denies neurological change such as new headache, facial or extremity weakness.  Pt denies polydipsia, polyuria, or low sugar symptoms. Pt states overall good compliance with treatment and medications, good tolerability, and has been trying to follow lower cholesterol diet.  Pt denies worsening depressive symptoms, suicidal ideation or panic. No fever, night sweats, wt loss, loss of appetite, or other constitutional symptoms.  Pt states good ability with ADL's, has low fall risk, home safety reviewed and adequate, no other significant changes in hearing or vision, and only occasionally active with exercise. Did have siezure and seen at [facility], with a mention of slightly elevated creatinine as well, asked to make sure to f/u here.  Does have several wks ongoing nasal allergy symptoms with clearish congestion, itch and sneezing, without fever, pain, ST, cough, swelling or wheezing, and has hx of what sounds like retention cyst on right, asks for ent referral Past Medical History  Diagnosis Date  . Hypertension   . HTN (hypertension) 10/29/2010  . H/O: hysterectomy 10/29/2010  . Chronic constipation 10/29/2010  . Eczema 10/29/2010  . History of cervical cancer 10/29/2010  . Allergic rhinitis, cause unspecified 10/29/2010  . Hyperlipidemia   . Orthostatic hypotension 06/05/2012  . Impaired glucose tolerance 08/16/2012  . Memory loss    Past Surgical History  Procedure Laterality Date  . Abdominal hysterectomy  1970  . Breast biopsy  1980's    benign  . Cataract extraction w/ intraocular lens  implant, bilateral  01/2011  . Eye surgery      reports that she has quit smoking. Her smoking use included Cigarettes. She  smoked 0.00 packs per day. She has never used smokeless tobacco. She reports that she does not drink alcohol or use illicit drugs. family history includes High blood pressure in an unspecified family member and Hypertension in her father and mother. No Known Allergies Current Outpatient Prescriptions on File Prior to Visit  Medication Sig Dispense Refill  . aspirin 81 MG EC tablet Take 1 tablet (81 mg total) by mouth daily. Swallow whole.  30 tablet  12  . donepezil (ARICEPT) 5 MG tablet Take 1 tablet (5 mg total) by mouth every morning. Call after 1 month for dose increase to 10mg .  30 tablet  0  . hydrochlorothiazide (HYDRODIURIL) 25 MG tablet Take 1 tablet (25 mg total) by mouth daily.  90 tablet  3  . KLOR-CON M10 10 MEQ tablet 10 mEq daily.      Marland Kitchen labetalol (NORMODYNE) 300 MG tablet Take 1 tablet (300 mg total) by mouth 2 (two) times daily.  180 tablet  3  . levETIRAcetam (KEPPRA) 1000 MG tablet Take 1 tablet (1,000 mg total) by mouth 2 (two) times daily.  60 tablet  6  . levETIRAcetam (KEPPRA) 1000 MG tablet Take 1 tablet (1,000 mg total) by mouth 2 (two) times daily.  60 tablet  12  . lisinopril (PRINIVIL,ZESTRIL) 40 MG tablet Take 1 tablet (40 mg total) by mouth daily.  90 tablet  3  . Polyethyl Glycol-Propyl Glycol (SYSTANE OP) Place 2-4 drops into both eyes 4 (four) times daily.      . potassium chloride (KLOR-CON 10) 10 MEQ  tablet Take 1 tablet (10 mEq total) by mouth daily.  90 tablet  3   No current facility-administered medications on file prior to visit.   Review of Systems Constitutional: Negative for diaphoresis, activity change, appetite change or unexpected weight change.  HENT: Negative for hearing loss, ear pain, facial swelling, mouth sores and neck stiffness.   Eyes: Negative for pain, redness and visual disturbance.  Respiratory: Negative for shortness of breath and wheezing.   Cardiovascular: Negative for chest pain and palpitations.  Gastrointestinal: Negative for  diarrhea, blood in stool, abdominal distention or other pain Genitourinary: Negative for hematuria, flank pain or change in urine volume.  Musculoskeletal: Negative for myalgias and joint swelling.  Skin: Negative for color change and wound.  Neurological: Negative for syncope and numbness. other than noted Hematological: Negative for adenopathy.  Psychiatric/Behavioral: Negative for hallucinations, self-injury, decreased concentration and agitation.      Objective:   Physical Exam BP 142/88  Pulse 63  Temp(Src) 97.9 F (36.6 C) (Oral)  Ht 5\' 3"  (1.6 m)  Wt 148 lb 4 oz (67.246 kg)  BMI 26.27 kg/m2  SpO2 98% VS noted,  Constitutional: Pt is oriented to person, place, and time. Appears well-developed and well-nourished.  Head: Normocephalic and atraumatic.  Right Ear: External ear normal.  Left Ear: External ear normal.  Nose: Nose normal.  Mouth/Throat: Oropharynx is clear and moist.  Bilat tm's with mild erythema.  Max sinus areas non tender.  Pharynx with mild erythema, no exudate Eyes: Conjunctivae and EOM are normal. Pupils are equal, round, and reactive to light.  Neck: Normal range of motion. Neck supple. No JVD present. No tracheal deviation present.  Cardiovascular: Normal rate, regular rhythm, normal heart sounds and intact distal pulses.   Pulmonary/Chest: Effort normal and breath sounds normal.  Abdominal: Soft. Bowel sounds are normal. There is no tenderness. No HSM  Musculoskeletal: Normal range of motion. Exhibits no edema.  Lymphadenopathy:  Has no cervical adenopathy.  Neurological: Pt is alert and oriented to person, place, and time. Pt has normal reflexes. No cranial nerve deficit.  Skin: Skin is warm and dry. No rash noted.  Psychiatric:  Has  normal mood and affect. Behavior is normal. not depressed affect    Assessment & Plan:

## 2012-12-05 NOTE — Assessment & Plan Note (Signed)
stable overall by history and exam, recent data reviewed with pt, and pt to continue medical treatment as before,  to f/u any worsening symptoms or concerns  

## 2012-12-05 NOTE — Assessment & Plan Note (Signed)

## 2012-12-05 NOTE — Patient Instructions (Signed)
Please continue all other medications as before, and all refills have been sent (except for the keppra); you would only need to take 25 mg of the fluid pill, and I increased the aricept to 10 mg per day Please also try Allegra OTC as needed for nasal allergy symptoms You will be contacted regarding the referral for: ENT for the possible maxillary sinus cyst Please go to the LAB in the Basement (turn left off the elevator) for the tests to be done today You will be contacted by phone if any changes need to be made immediately.  Otherwise, you will receive a letter about your results with an explanation, but please check with MyChart first.  Thank you for enrolling in MyChart. Please follow the instructions below to securely access your online medical record. MyChart allows you to send messages to your doctor, view your test results, renew your prescriptions, schedule appointments, and more.  Please return in 6 months, or sooner if needed

## 2012-12-06 ENCOUNTER — Ambulatory Visit: Payer: Medicare PPO | Admitting: Internal Medicine

## 2012-12-07 NOTE — Telephone Encounter (Signed)
Patient daughter has picked up RX and patient is taking like she should.

## 2013-02-07 ENCOUNTER — Telehealth: Payer: Self-pay | Admitting: Neurology

## 2013-02-08 NOTE — Telephone Encounter (Signed)
Based on Carolyn's note in may , she has seizure and vascular dementia, it is ok to generate a note.

## 2013-02-08 NOTE — Telephone Encounter (Signed)
Patient wants a letter stating that she has dementia. Please advise on what needs to be done about this.

## 2013-02-19 ENCOUNTER — Encounter: Payer: Self-pay | Admitting: *Deleted

## 2013-02-21 ENCOUNTER — Telehealth: Payer: Self-pay

## 2013-02-21 NOTE — Telephone Encounter (Signed)
I called patient and spoke with her daughter. I let her know that a letter has been generated as requested by patient. It is awaiting Dr. Zannie Cove signature. Dr. Terrace Arabia will be back ion the office next week. When it is signed we will mail it out to her.

## 2013-03-02 ENCOUNTER — Telehealth: Payer: Self-pay | Admitting: Neurology

## 2013-03-02 NOTE — Telephone Encounter (Signed)
I spoke to daughter and she relayed that the patient had a seizure yesterday morning.  The patient was by herself and said she felt hot and weak, so she went to lay down.  Daughter said she bounced back much quicker this time, also the patient has not had a seizure in 13 weaks.  Daughter just wanted doctor to be aware, not sure if meds need to be changed.

## 2013-03-05 NOTE — Telephone Encounter (Signed)
I do not want to increase the Keppra at this time

## 2013-03-05 NOTE — Telephone Encounter (Signed)
I spoke to daughter and relayed that Teresa Riley did not want to change Keppra dose.  She expressed understanding.  I told her to call if patient has increase in seizures.

## 2013-04-04 ENCOUNTER — Other Ambulatory Visit (INDEPENDENT_AMBULATORY_CARE_PROVIDER_SITE_OTHER): Payer: Medicare HMO

## 2013-04-04 ENCOUNTER — Ambulatory Visit (INDEPENDENT_AMBULATORY_CARE_PROVIDER_SITE_OTHER): Payer: Medicare HMO | Admitting: Internal Medicine

## 2013-04-04 ENCOUNTER — Ambulatory Visit (INDEPENDENT_AMBULATORY_CARE_PROVIDER_SITE_OTHER)
Admission: RE | Admit: 2013-04-04 | Discharge: 2013-04-04 | Disposition: A | Discharge status: Home / Self Care | Payer: Medicare HMO | Source: Ambulatory Visit | Attending: Internal Medicine | Admitting: Internal Medicine

## 2013-04-04 ENCOUNTER — Encounter: Payer: Self-pay | Admitting: Internal Medicine

## 2013-04-04 ENCOUNTER — Other Ambulatory Visit: Payer: Self-pay | Admitting: Internal Medicine

## 2013-04-04 VITALS — BP 130/92 | HR 76 | Temp 97.4°F | Ht 63.0 in | Wt 144.0 lb

## 2013-04-04 DIAGNOSIS — E785 Hyperlipidemia, unspecified: Secondary | ICD-10-CM

## 2013-04-04 DIAGNOSIS — I517 Cardiomegaly: Secondary | ICD-10-CM

## 2013-04-04 DIAGNOSIS — R5381 Other malaise: Secondary | ICD-10-CM

## 2013-04-04 DIAGNOSIS — R634 Abnormal weight loss: Secondary | ICD-10-CM

## 2013-04-04 DIAGNOSIS — E876 Hypokalemia: Secondary | ICD-10-CM

## 2013-04-04 DIAGNOSIS — Z23 Encounter for immunization: Secondary | ICD-10-CM

## 2013-04-04 DIAGNOSIS — I10 Essential (primary) hypertension: Secondary | ICD-10-CM

## 2013-04-04 LAB — CBC WITH DIFFERENTIAL/PLATELET
Basophils Absolute: 0.1 10*3/uL (ref 0.0–0.1)
Basophils Relative: 1.7 % (ref 0.0–3.0)
Eosinophils Absolute: 0.1 10*3/uL (ref 0.0–0.7)
Eosinophils Relative: 3.1 % (ref 0.0–5.0)
Lymphocytes Relative: 49.3 % — ABNORMAL HIGH (ref 12.0–46.0)
MCHC: 34.2 g/dL (ref 30.0–36.0)
MCV: 92.5 fl (ref 78.0–100.0)
Monocytes Absolute: 0.3 10*3/uL (ref 0.1–1.0)
Neutrophils Relative %: 38.8 % — ABNORMAL LOW (ref 43.0–77.0)
Platelets: 223 10*3/uL (ref 150.0–400.0)
RDW: 12.7 % (ref 11.5–14.6)
WBC: 4.5 10*3/uL (ref 4.5–10.5)

## 2013-04-04 LAB — HEPATIC FUNCTION PANEL
ALT: 14 U/L (ref 0–35)
AST: 23 U/L (ref 0–37)
Albumin: 3.7 g/dL (ref 3.5–5.2)
Total Bilirubin: 0.7 mg/dL (ref 0.3–1.2)

## 2013-04-04 LAB — BASIC METABOLIC PANEL
BUN: 13 mg/dL (ref 6–23)
Chloride: 105 mEq/L (ref 96–112)
Glucose, Bld: 90 mg/dL (ref 70–99)
Potassium: 4 mEq/L (ref 3.5–5.1)

## 2013-04-04 LAB — TSH: TSH: 1.44 u[IU]/mL (ref 0.35–5.50)

## 2013-04-04 NOTE — Patient Instructions (Addendum)
Please remember to followup with your mammogram as you do You had the flu shot today Please consider having the Prevnar shot Please call if you change your mind about taking the lipitor OK to stop the fluid pill (hydrochlorothiazide), and potassium pill Please continue all other medications as before, and refills have been done if requested, including the aspirin, lisinopril, labetolol, and all the other medications Please go to the XRAY Department in the Basement (go straight as you get off the elevator) for the x-ray testing Please go to the LAB in the Basement (turn left off the elevator) for the tests to be done today You will be contacted by phone if any changes need to be made immediately.  Otherwise, you will receive a letter about your results with an explanation, but please check with MyChart first.  Please remember to sign up for My Chart if you have not done so, as this will be important to you in the future with finding out test results, communicating by private email, and scheduling acute appointments online when needed.  Please return in 6 months, or sooner if needed

## 2013-04-04 NOTE — Progress Notes (Signed)
Subjective:    Patient ID: Teresa Riley, female    DOB: 12-11-1934, 78 y.o.   MRN: [id number]  HPI  Here to f/u; overall doing ok,  Pt denies chest pain, increased sob or doe, wheezing, orthopnea, PND, increased LE swelling, palpitations, dizziness or syncope.  Pt denies polydipsia, polyuria, or low sugar symptoms such as weakness or confusion improved with po intake.  Pt denies new neurological symptoms such as new headache, or facial or extremity weakness or numbness.   Pt states overall good compliance with meds, has been trying to follow lower cholesterol, diabetic diet, with wt overall stable,  but little exercise however.  Does not want to take the potassium further.   Has been working on diet instead of statin for now.  Due for flu shot. Does c/o ongoing fatigue, but denies signficant daytime hypersomnolence.  Also with recent wt loss, and several leg cramps Past Medical History  Diagnosis Date  . Hypertension   . HTN (hypertension) 10/29/2010  . H/O: hysterectomy 10/29/2010  . Chronic constipation 10/29/2010  . Eczema 10/29/2010  . History of cervical cancer 10/29/2010  . Allergic rhinitis, cause unspecified 10/29/2010  . Hyperlipidemia   . Orthostatic hypotension 06/05/2012  . Impaired glucose tolerance 08/16/2012  . Memory loss    Past Surgical History  Procedure Laterality Date  . Abdominal hysterectomy  1970  . Breast biopsy  1980's    benign  . Cataract extraction w/ intraocular lens  implant, bilateral  01/2011  . Eye surgery      reports that she has quit smoking. Her smoking use included Cigarettes. She smoked 0.00 packs per day. She has never used smokeless tobacco. She reports that she does not drink alcohol or use illicit drugs. family history includes High blood pressure in an other family member; Hypertension in her father and mother. No Known Allergies Current Outpatient Prescriptions on File Prior to Visit  Medication Sig Dispense Refill  . aspirin 81 MG EC tablet Take  1 tablet (81 mg total) by mouth daily. Swallow whole.  30 tablet  12  . donepezil (ARICEPT) 10 MG tablet Take 1 tablet (10 mg total) by mouth at bedtime as needed.  90 tablet  3  . labetalol (NORMODYNE) 300 MG tablet Take 1 tablet (300 mg total) by mouth 2 (two) times daily.  180 tablet  3  . levETIRAcetam (KEPPRA) 1000 MG tablet Take 1 tablet (1,000 mg total) by mouth 2 (two) times daily.  60 tablet  12  . lisinopril (PRINIVIL,ZESTRIL) 40 MG tablet Take 1 tablet (40 mg total) by mouth daily.  90 tablet  3  . Polyethyl Glycol-Propyl Glycol (SYSTANE OP) Place 2-4 drops into both eyes 4 (four) times daily.       No current facility-administered medications on file prior to visit.    Review of Systems  Constitutional: Negative for unexpected weight change, or unusual diaphoresis  HENT: Negative for tinnitus.   Eyes: Negative for photophobia and visual disturbance.  Respiratory: Negative for choking and stridor.   Gastrointestinal: Negative for vomiting and blood in stool.  Genitourinary: Negative for hematuria and decreased urine volume.  Musculoskeletal: Negative for acute joint swelling Skin: Negative for color change and wound.  Neurological: Negative for tremors and numbness other than noted  Psychiatric/Behavioral: Negative for decreased concentration or  hyperactivity.       Objective:   Physical Exam BP 130/92  Pulse 76  Temp(Src) 97.4 F (36.3 C) (Oral)  Ht 5\' 3"  (1.6 m)  Wt 144 lb (65.318 kg)  BMI 25.51 kg/m2  SpO2 94% VS noted,  Constitutional: Pt appears well-developed and well-nourished.  HENT: Head: NCAT.  Right Ear: External ear normal.  Left Ear: External ear normal.  Eyes: Conjunctivae and EOM are normal. Pupils are equal, round, and reactive to light.  Neck: Normal range of motion. Neck supple.  Cardiovascular: Normal rate and regular rhythm.   Pulmonary/Chest: Effort normal and breath sounds normal.  Abd:  Soft, NT, non-distended, + BS Neurological: Pt is  alert. Not confused  Skin: Skin is warm. No erythema.  Psychiatric: Pt behavior is normal. Thought content normal.     Assessment & Plan:

## 2013-04-06 ENCOUNTER — Telehealth: Payer: Self-pay | Admitting: *Deleted

## 2013-04-06 ENCOUNTER — Ambulatory Visit (INDEPENDENT_AMBULATORY_CARE_PROVIDER_SITE_OTHER): Payer: Medicare PPO | Admitting: Nurse Practitioner

## 2013-04-06 ENCOUNTER — Encounter: Payer: Self-pay | Admitting: Nurse Practitioner

## 2013-04-06 VITALS — BP 166/80 | HR 69 | Ht 64.0 in | Wt 145.0 lb

## 2013-04-06 DIAGNOSIS — Z79899 Other long term (current) drug therapy: Secondary | ICD-10-CM

## 2013-04-06 DIAGNOSIS — G40109 Localization-related (focal) (partial) symptomatic epilepsy and epileptic syndromes with simple partial seizures, not intractable, without status epilepticus: Secondary | ICD-10-CM

## 2013-04-06 DIAGNOSIS — R413 Other amnesia: Secondary | ICD-10-CM

## 2013-04-06 NOTE — Telephone Encounter (Signed)
[name] called requesting a status on pts Echocardiogram that was to be scheduled.  Further states pt becomes fatigued after walking at times she becomes sweaty.  Please advise

## 2013-04-06 NOTE — Telephone Encounter (Signed)
Spoke with [name] advised of MDs message

## 2013-04-06 NOTE — Progress Notes (Signed)
[facility]  PATIENT: Teresa Riley DOB: [birth year]   REASON FOR VISIT: Followup for memory loss and seizure disorder   HISTORY OF PRESENT ILLNESS: Teresa Riley, 78 year old black female returns for followup She has a history of seizure disorder with additional seizure occurring in August 2014.  EEG showed slowing. She also has a markedly abnormal MRI most suggestive of a vascular dementia. She is taking Aricept 10 mg daily without side effects. She is currently taking Keppra 1000 twice a day. She lives with one of her daughters for one month and then switches to the other daughter. There are been no behavior issues, sleeps well at night appetite is good. She is walking for exercise . She has a history of seizure disorder. EEG showed slowing. She also has a markedly abnormal MRI most suggestive of a vascular dementia. She is currently taking Keppra 1000mg   twice a day with last seizure in August 2014.  She lives with one of her daughters for one month and then switches to the other daughter. There are been no behavior issues, sleeps well at night appetite is good. She is walking for exercise,  patient claims she was recently told she has an enlarged heart   REVIEW OF SYSTEMS: Full 14 system review of systems performed and notable only for:  Constitutional: Weight loss, fatigue Cardiovascular: N/A  Ear/Nose/Throat: N/A  Skin: N/A  Eyes: N/A  Respiratory: N/A  Gastroitestinal: N/A  Hematology/Lymphatic: N/A  Endocrine: N/A Musculoskeletal:N/A  Allergy/Immunology: N/A  Neurological: Memory loss seizure  Psychiatric: Decreased energy  ALLERGIES: No Known Allergies  HOME MEDICATIONS: Outpatient Prescriptions Prior to Visit  Medication Sig Dispense Refill  . aspirin 81 MG EC tablet Take 1 tablet (81 mg total) by mouth daily. Swallow whole.  30 tablet  12  . donepezil (ARICEPT) 10 MG tablet Take 1 tablet (10 mg total) by mouth at bedtime as needed.  90 tablet  3  .  labetalol (NORMODYNE) 300 MG tablet Take 1 tablet (300 mg total) by mouth 2 (two) times daily.  180 tablet  3  . levETIRAcetam (KEPPRA) 1000 MG tablet Take 1 tablet (1,000 mg total) by mouth 2 (two) times daily.  60 tablet  6  . levETIRAcetam (KEPPRA) 1000 MG tablet Take 1 tablet (1,000 mg total) by mouth 2 (two) times daily.  60 tablet  12  . lisinopril (PRINIVIL,ZESTRIL) 40 MG tablet Take 1 tablet (40 mg total) by mouth daily.  90 tablet  3  . Polyethyl Glycol-Propyl Glycol (SYSTANE OP) Place 2-4 drops into both eyes 4 (four) times daily.       No facility-administered medications prior to visit.    PAST MEDICAL HISTORY: Past Medical History  Diagnosis Date  . Hypertension   . HTN (hypertension) 10/29/2010  . H/O: hysterectomy 10/29/2010  . Chronic constipation 10/29/2010  . Eczema 10/29/2010  . History of cervical cancer 10/29/2010  . Allergic rhinitis, cause unspecified 10/29/2010  . Hyperlipidemia   . Orthostatic hypotension 06/05/2012  . Impaired glucose tolerance 08/16/2012  . Memory loss     PAST SURGICAL HISTORY: Past Surgical History  Procedure Laterality Date  . Abdominal hysterectomy  1970  . Breast biopsy  1980's    benign  . Cataract extraction w/ intraocular lens  implant, bilateral  01/2011  . Eye surgery      FAMILY HISTORY: Family History  Problem Relation Age of Onset  . Hypertension Mother   . Hypertension Father   . High blood pressure  SOCIAL HISTORY: History   Social History  . Marital Status: Divorced    Spouse Name: N/A    Number of Children: 3  . Years of Education: 2   Occupational History  . retired    Social History Main Topics  . Smoking status: Former Smoker    Types: Cigarettes  . Smokeless tobacco: Never Used     Comment: quit in 1973  . Alcohol Use: No  . Drug Use: No  . Sexual Activity: Not on file   Other Topics Concern  . Not on file   Social History Narrative   Patient lives at home with two daughters Teresa Riley)  Teresa Riley). Patient is retired. Patient has two years college.     PHYSICAL EXAM  Filed Vitals:   04/06/13 0903  BP: 166/80  Pulse: 69  Height: 5\' 4"  (1.626 m)  Weight: 145 lb (65.772 kg)   Body mass index is 24.88 kg/(m^2).  Generalized: Well developed, in no acute distress  Neurological examination   Mentation: Alert, MMSE 28/30 missing one orientation question and one of 3 recall. AFT 15.  Follows all commands speech and language fluent  Cranial nerve II-XII: Pupils were equal round reactive to light extraocular movements were full, visual field were full on confrontational test. Facial sensation and strength were normal. hearing was intact to finger rubbing bilaterally. Uvula tongue midline. head turning and shoulder shrug and were normal and symmetric.Tongue protrusion into cheek strength was normal. Motor: normal bulk and tone, full strength in the BUE, BLE, fine finger movements normal, no pronator drift. No focal weakness Sensory: normal and symmetric to light touch, pinprick, and  vibration  Coordination: finger-nose-finger, heel-to-shin bilaterally, no dysmetria Reflexes: Brachioradialis 2/2, biceps 2/2, triceps 2/2, patellar 2/2, Achilles 2/2, plantar responses were flexor bilaterally. Gait and Station: Rising up from seated position without assistance, normal stance, without trunk ataxia, moderate stride, good arm swing, smooth turning, able to perform tiptoe, and heel walking without difficulty. Tandem is steady  DIAGNOSTIC DATA (LABS, IMAGING, TESTING) - I reviewed patient records, labs, notes, testing and imaging myself where available.  Lab Results  Component Value Date   WBC 4.5 04/04/2013   HGB 12.1 04/04/2013   HCT 35.5* 04/04/2013   MCV 92.5 04/04/2013   PLT 223.0 04/04/2013      Component Value Date/Time   NA 138 04/04/2013 1227   K 4.0 04/04/2013 1227   CL 105 04/04/2013 1227   CO2 29 04/04/2013 1227   GLUCOSE 90 04/04/2013 1227   BUN 13 04/04/2013 1227     CREATININE 1.1 04/04/2013 1227   CALCIUM 9.6 04/04/2013 1227   PROT 7.3 04/04/2013 1227   ALBUMIN 3.7 04/04/2013 1227   AST 23 04/04/2013 1227   ALT 14 04/04/2013 1227   ALKPHOS 54 04/04/2013 1227   BILITOT 0.7 04/04/2013 1227   GFRNONAA 59* 08/16/2012 1215   GFRAA 69* 08/16/2012 1215   Lab Results  Component Value Date   CHOL 281* 12/05/2012   HDL 82.30 12/05/2012   LDLCALC 86 06/12/2012   LDLDIRECT 170.4 12/05/2012   TRIG 138.0 12/05/2012   CHOLHDL 3 12/05/2012     Lab Results  Component Value Date   TSH 1.44 04/04/2013      ASSESSMENT AND PLAN  78 y.o. year old female  has a past medical history of Hypertension; HTN ; Hyperlipidemia;  Memory loss and seizure disorder with most recent seizure occurring in August 2014. Keppra dose currently 1000 mg twice daily.  Continue  Keppra thousand milligrams twice daily does not need refills Keppra level today Continue Aricept 10 mg daily,  Walk for exercise Followup in 6 months Nilda Riggs, [facility], [facility], APRN  [facility] [address] [place], [place] [address] [phone]

## 2013-04-06 NOTE — Patient Instructions (Addendum)
Continue Keppra thousand milligrams twice daily does not need refills Keppra level today Continue Aricept 10 mg daily Walk for exercise Followup in 6 months

## 2013-04-06 NOTE — Telephone Encounter (Signed)
Was ordered sept 24, and normally takes some time to get this done  I can forward to our [facility]

## 2013-04-08 NOTE — Assessment & Plan Note (Addendum)
stable overall by history and exam, recent data reviewed with pt, and pt to d/c hct, start amlod 5 mg,  to f/u any worsening symptoms or concerns BP Readings from Last 3 Encounters:  04/06/13 166/80  04/04/13 130/92  12/05/12 142/88

## 2013-04-08 NOTE — Assessment & Plan Note (Signed)
stable overall by history and exam, recent data reviewed with pt, and pt to continue medical treatment as before,  to f/u any worsening symptoms or concerns Lab Results  Component Value Date   LDLCALC 86 06/12/2012

## 2013-04-08 NOTE — Assessment & Plan Note (Signed)
Also for cxr,  to f/u any worsening symptoms or concerns 

## 2013-04-08 NOTE — Assessment & Plan Note (Signed)
Ok to stop the K

## 2013-04-08 NOTE — Assessment & Plan Note (Signed)
Etiology unclear, Exam otherwise benign, to check labs as documented, follow with expectant management  

## 2013-04-09 ENCOUNTER — Telehealth: Payer: Self-pay | Admitting: Internal Medicine

## 2013-04-09 ENCOUNTER — Telehealth: Payer: Self-pay | Admitting: Nurse Practitioner

## 2013-04-09 LAB — LEVETIRACETAM LEVEL: Levetiracetam Lvl: 47.6 ug/mL (ref 5.0–63.0)

## 2013-04-09 MED ORDER — DONEPEZIL HCL 5 MG PO TABS: 5.0000 mg | ORAL_TABLET | Freq: Every day | ORAL | Status: DC

## 2013-04-09 NOTE — Telephone Encounter (Signed)
Good Keppra level will continue same dose. Decrease Aricept to 5 mg daily. Call for further seizure activity. Daughter verbalizes understanding.

## 2013-04-09 NOTE — Telephone Encounter (Signed)
Patient Information:  Caller Name: [name]  Phone: [phone]  Patient: August Saucer  Gender: Female  DOB: [birth year]  Age: 78 Years  PCP: Oliver Barre (Adults only)  Office Follow Up:  Does the office need to follow up with this patient?: No  Instructions For The Office: N/A   Symptoms  Reason For Call & Symptoms: Daughter calling, she has had dizziness/lightheadedness "for some time".  She has been on medication for seizures since February and thought that that could be the cause.  The Neurologist told her last week that the medication would not be causing the sx.  The dizziness seems to be worse.  States that when she is walking and stops, her head keeps spinning.  She fell on Friday with these sx.    She does not have the sx when sitting down.  Reviewed Health History In EMR: Yes  Reviewed Medications In EMR: Yes  Reviewed Allergies In EMR: Yes  Reviewed Surgeries / Procedures: Yes  Date of Onset of Symptoms: 01/09/2013  Guideline(s) Used:  Dizziness  Disposition Per Guideline:   Discuss with PCP and Callback by Nurse Today  Reason For Disposition Reached:   Taking a medicine that could cause dizziness (e.g., blood pressure medications, diuretics)  Advice Given:  N/A  Patient Will Follow Care Advice:  YES  Offered appt., daughter declined.  States that she is waiting on the referral for the echocardiagram.

## 2013-04-23 ENCOUNTER — Ambulatory Visit (HOSPITAL_COMMUNITY): Payer: Medicare HMO | Attending: Internal Medicine

## 2013-04-23 ENCOUNTER — Other Ambulatory Visit (HOSPITAL_COMMUNITY): Payer: Self-pay | Admitting: Internal Medicine

## 2013-04-23 DIAGNOSIS — I517 Cardiomegaly: Secondary | ICD-10-CM

## 2013-04-23 NOTE — Progress Notes (Signed)
Echocardiogram performed.  

## 2013-05-16 ENCOUNTER — Ambulatory Visit: Payer: Medicare PPO | Admitting: Nurse Practitioner

## 2013-06-13 ENCOUNTER — Ambulatory Visit: Payer: Medicare HMO | Admitting: Internal Medicine

## 2013-06-14 ENCOUNTER — Other Ambulatory Visit: Payer: Self-pay | Admitting: Internal Medicine

## 2013-06-26 ENCOUNTER — Telehealth: Payer: Self-pay | Admitting: Neurology

## 2013-06-26 NOTE — Telephone Encounter (Signed)
Keppra is not sampled, as it is available in generic.  Patient has old Rx of 750mg  and wants to know if she can take those.  If so, should she cut them in half?  Ammie sent a message to the provider inquiring about this.

## 2013-06-27 NOTE — Telephone Encounter (Signed)
Unfortunately, there is not a patient assist program for this drug.  I called the patient back, got no answer.  Left message saying she should continue to take meds as prescribed because she is stable.  Also recommended she go online to [email or web address].  This website will compare pharmacy prices in her area.  She should be able to get meds at a discounted rate.  When I looked in the zip code [address], [facility] had 30 day supply for under $60.  Patient can ask pharmacy to transfer her Rx to a less expensive pharmacy, or a new Rx can be called in.  Asked patient to call us back if needed.

## 2013-06-27 NOTE — Telephone Encounter (Signed)
Teresa Riley, please call patient,  Based on office visit note, she still has recurrent seizure, she still has recurrent seizure. She should take keppra as prescribed.  Shanda Bumps, can patient apply for any medication assistant program?

## 2013-08-15 ENCOUNTER — Telehealth: Payer: Self-pay | Admitting: Neurology

## 2013-08-15 NOTE — Telephone Encounter (Signed)
Patient's daughter called to state that she needs a statement from the doctor stating that patient has dementia and that she needs care. Please call patient's daughter and advise.

## 2013-08-16 NOTE — Telephone Encounter (Signed)
Patient's daughter calling again, states she missed a call from us. Please call back.

## 2013-08-17 NOTE — Telephone Encounter (Signed)
Called patient's daughter concerning the statement that she is requesting for her mother. I informed the daughter that we needed a legal documentation stating that she has guardianship or power of attorney in order to get information. Patient stated that she would get her mother to call and request the information. I advised the patient's daughter if the patient has any other problems, questions or concerns to call the office. Patient's daughter verbalized understanding.

## 2013-10-02 ENCOUNTER — Ambulatory Visit (INDEPENDENT_AMBULATORY_CARE_PROVIDER_SITE_OTHER): Payer: Medicare PPO | Admitting: Nurse Practitioner

## 2013-10-02 ENCOUNTER — Ambulatory Visit (INDEPENDENT_AMBULATORY_CARE_PROVIDER_SITE_OTHER): Payer: Medicare HMO | Admitting: Internal Medicine

## 2013-10-02 ENCOUNTER — Encounter: Payer: Self-pay | Admitting: Internal Medicine

## 2013-10-02 ENCOUNTER — Other Ambulatory Visit (INDEPENDENT_AMBULATORY_CARE_PROVIDER_SITE_OTHER): Payer: Medicare HMO

## 2013-10-02 ENCOUNTER — Encounter: Payer: Self-pay | Admitting: Nurse Practitioner

## 2013-10-02 VITALS — BP 152/90 | HR 67 | Temp 98.1°F | Ht 63.0 in | Wt 148.4 lb

## 2013-10-02 VITALS — BP 151/80 | HR 67 | Ht 63.0 in | Wt 150.0 lb

## 2013-10-02 DIAGNOSIS — E785 Hyperlipidemia, unspecified: Secondary | ICD-10-CM

## 2013-10-02 DIAGNOSIS — G4700 Insomnia, unspecified: Secondary | ICD-10-CM

## 2013-10-02 DIAGNOSIS — R413 Other amnesia: Secondary | ICD-10-CM

## 2013-10-02 DIAGNOSIS — R7309 Other abnormal glucose: Secondary | ICD-10-CM

## 2013-10-02 DIAGNOSIS — L259 Unspecified contact dermatitis, unspecified cause: Secondary | ICD-10-CM

## 2013-10-02 DIAGNOSIS — Z0000 Encounter for general adult medical examination without abnormal findings: Secondary | ICD-10-CM

## 2013-10-02 DIAGNOSIS — R7302 Impaired glucose tolerance (oral): Secondary | ICD-10-CM

## 2013-10-02 DIAGNOSIS — G40109 Localization-related (focal) (partial) symptomatic epilepsy and epileptic syndromes with simple partial seizures, not intractable, without status epilepticus: Secondary | ICD-10-CM

## 2013-10-02 DIAGNOSIS — Z23 Encounter for immunization: Secondary | ICD-10-CM

## 2013-10-02 DIAGNOSIS — I10 Essential (primary) hypertension: Secondary | ICD-10-CM

## 2013-10-02 DIAGNOSIS — L309 Dermatitis, unspecified: Secondary | ICD-10-CM

## 2013-10-02 LAB — LIPID PANEL
CHOLESTEROL: 261 mg/dL — AB (ref 0–200)
HDL: 92.5 mg/dL (ref >39.00)
LDL CALC: 155 mg/dL — AB (ref 0–99)
Total CHOL/HDL Ratio: 3
Triglycerides: 68 mg/dL (ref 0.0–149.0)
VLDL: 13.6 mg/dL (ref 0.0–40.0)

## 2013-10-02 LAB — CBC WITH DIFFERENTIAL/PLATELET
Basophils Absolute: 0 10*3/uL (ref 0.0–0.1)
Basophils Relative: 0.8 % (ref 0.0–3.0)
EOS PCT: 1.9 % (ref 0.0–5.0)
Eosinophils Absolute: 0.1 10*3/uL (ref 0.0–0.7)
HEMATOCRIT: 36.5 % (ref 36.0–46.0)
HEMOGLOBIN: 12.2 g/dL (ref 12.0–15.0)
LYMPHS ABS: 2.1 10*3/uL (ref 0.7–4.0)
Lymphocytes Relative: 46.1 % — ABNORMAL HIGH (ref 12.0–46.0)
MCHC: 33.3 g/dL (ref 30.0–36.0)
MCV: 94.2 fl (ref 78.0–100.0)
Monocytes Absolute: 0.4 10*3/uL (ref 0.1–1.0)
Monocytes Relative: 8.6 % (ref 3.0–12.0)
Neutro Abs: 2 10*3/uL (ref 1.4–7.7)
Neutrophils Relative %: 42.6 % — ABNORMAL LOW (ref 43.0–77.0)
PLATELETS: 254 10*3/uL (ref 150.0–400.0)
RBC: 3.88 Mil/uL (ref 3.87–5.11)
RDW: 12.9 % (ref 11.5–14.6)
WBC: 4.6 10*3/uL (ref 4.5–10.5)

## 2013-10-02 LAB — VITAMIN B12: Vitamin B-12: 924 pg/mL — ABNORMAL HIGH (ref 211–911)

## 2013-10-02 LAB — HEPATIC FUNCTION PANEL
ALBUMIN: 4 g/dL (ref 3.5–5.2)
ALT: 15 U/L (ref 0–35)
AST: 29 U/L (ref 0–37)
Alkaline Phosphatase: 58 U/L (ref 39–117)
BILIRUBIN TOTAL: 1 mg/dL (ref 0.3–1.2)
Bilirubin, Direct: 0.1 mg/dL (ref 0.0–0.3)
Total Protein: 7.5 g/dL (ref 6.0–8.3)

## 2013-10-02 LAB — BASIC METABOLIC PANEL
BUN: 19 mg/dL (ref 6–23)
CO2: 30 mEq/L (ref 19–32)
Calcium: 10.2 mg/dL (ref 8.4–10.5)
Chloride: 99 mEq/L (ref 96–112)
Creatinine, Ser: 1.2 mg/dL (ref 0.4–1.2)
GFR: 57.16 mL/min — AB (ref >60.00)
GLUCOSE: 92 mg/dL (ref 70–99)
POTASSIUM: 3.8 meq/L (ref 3.5–5.1)
Sodium: 137 mEq/L (ref 135–145)

## 2013-10-02 LAB — URINALYSIS, ROUTINE W REFLEX MICROSCOPIC
Bilirubin Urine: NEGATIVE
KETONES UR: NEGATIVE
LEUKOCYTES UA: NEGATIVE
Nitrite: NEGATIVE
PH: 7 (ref 5.0–8.0)
Specific Gravity, Urine: 1.02 (ref 1.000–1.030)
TOTAL PROTEIN, URINE-UPE24: NEGATIVE
Urine Glucose: NEGATIVE
Urobilinogen, UA: 0.2 (ref 0.0–1.0)
WBC, UA: NONE SEEN (ref 0–?)

## 2013-10-02 LAB — TSH: TSH: 1.61 u[IU]/mL (ref 0.35–5.50)

## 2013-10-02 LAB — FOLATE: FOLATE: 20.6 ng/mL (ref >5.9)

## 2013-10-02 MED ORDER — ALPRAZOLAM 0.25 MG PO TABS: 0.2500 mg | ORAL_TABLET | Freq: Every evening | ORAL | Status: DC | PRN

## 2013-10-02 MED ORDER — FLUOCINONIDE 0.05 % EX CREA: 1.0000 "application " | TOPICAL_CREAM | Freq: Two times a day (BID) | CUTANEOUS | Status: DC

## 2013-10-02 MED ORDER — LEVETIRACETAM 1000 MG PO TABS: 1000.0000 mg | ORAL_TABLET | Freq: Two times a day (BID) | ORAL | Status: DC

## 2013-10-02 MED ORDER — POTASSIUM CHLORIDE CRYS ER 10 MEQ PO TBCR: 10.0000 meq | EXTENDED_RELEASE_TABLET | Freq: Once | ORAL | Status: DC

## 2013-10-02 MED ORDER — METHYLPREDNISOLONE ACETATE 80 MG/ML IJ SUSP
80.0000 mg | Freq: Once | INTRAMUSCULAR | Status: AC
  Administered 2013-10-02: 80 mg via INTRAMUSCULAR

## 2013-10-02 MED ORDER — ATORVASTATIN CALCIUM 10 MG PO TABS: 10.0000 mg | ORAL_TABLET | Freq: Every day | ORAL | Status: DC

## 2013-10-02 MED ORDER — DONEPEZIL HCL 5 MG PO TABS: 5.0000 mg | ORAL_TABLET | Freq: Every day | ORAL | Status: DC

## 2013-10-02 MED ORDER — AMLODIPINE BESYLATE 2.5 MG PO TABS: 2.5000 mg | ORAL_TABLET | Freq: Every day | ORAL | Status: DC

## 2013-10-02 NOTE — Assessment & Plan Note (Signed)
Ok to start lipitor as per pt acceptance  Lab Results  Component Value Date   LDLCALC 86 06/12/2012   Goal ldl < 100

## 2013-10-02 NOTE — Assessment & Plan Note (Signed)
Ok for very low dose xanax qhs prn,  to f/u any worsening symptoms or concerns

## 2013-10-02 NOTE — Progress Notes (Signed)
Subjective:    Patient ID: Teresa Riley, female    DOB: [birth year], 79 y.o.   MRN: [id number]  HPI  Here for wellness and f/u;  Overall doing ok;  Pt denies CP, worsening SOB, DOE, wheezing, orthopnea, PND, worsening LE edema, palpitations, dizziness or syncope.  Pt denies neurological change such as new headache, facial or extremity weakness.  Pt denies polydipsia, polyuria, or low sugar symptoms. Pt states overall good compliance with treatment and medications, good tolerability, and has been trying to follow lower cholesterol diet.  Pt denies worsening depressive symptoms, suicidal ideation or panic. No fever, night sweats, wt loss, loss of appetite, or other constitutional symptoms.  Pt states good ability with ADL's, has low fall risk, home safety reviewed and adequate, no other significant changes in hearing or vision, and only occasionally active with exercise. Has ongoing memory loss, family asks for f/u b12 check, and Vit d.  Has difficulty with sleep most nights.  Has worsening eczema in last few wks with itch and several lesions to extremities, did well with steroid shot prior.  Also now willing to start lipitor when did not want to do this last visit.  Ok for [employer] as well Past Medical History  Diagnosis Date  . Hypertension   . HTN (hypertension) 10/29/2010  . H/O: hysterectomy 10/29/2010  . Chronic constipation 10/29/2010  . Eczema 10/29/2010  . History of cervical cancer 10/29/2010  . Allergic rhinitis, cause unspecified 10/29/2010  . Hyperlipidemia   . Orthostatic hypotension 06/05/2012  . Impaired glucose tolerance 08/16/2012  . Memory loss    Past Surgical History  Procedure Laterality Date  . Abdominal hysterectomy  1970  . Breast biopsy  1980's    benign  . Cataract extraction w/ intraocular lens  implant, bilateral  01/2011  . Eye surgery      reports that she has quit smoking. Her smoking use included Cigarettes. She smoked 0.00 packs per day. She has never used smokeless  tobacco. She reports that she does not drink alcohol or use illicit drugs. family history includes High blood pressure in an other family member; Hypertension in her father and mother. No Known Allergies Current Outpatient Prescriptions on File Prior to Visit  Medication Sig Dispense Refill  . aspirin 81 MG EC tablet Take 1 tablet (81 mg total) by mouth daily. Swallow whole.  30 tablet  12  . donepezil (ARICEPT) 5 MG tablet Take 1 tablet (5 mg total) by mouth daily.  30 tablet  6  . labetalol (NORMODYNE) 300 MG tablet Take 1 tablet (300 mg total) by mouth 2 (two) times daily.  180 tablet  3  . levETIRAcetam (KEPPRA) 1000 MG tablet Take 1 tablet (1,000 mg total) by mouth 2 (two) times daily.  60 tablet  12  . lisinopril (PRINIVIL,ZESTRIL) 40 MG tablet Take 1 tablet (40 mg total) by mouth daily.  90 tablet  3  . Polyethyl Glycol-Propyl Glycol (SYSTANE OP) Place 2-4 drops into both eyes 4 (four) times daily.       No current facility-administered medications on file prior to visit.   Review of Systems Constitutional: Negative for diaphoresis, activity change, appetite change or unexpected weight change.  HENT: Negative for hearing loss, ear pain, facial swelling, mouth sores and neck stiffness.   Eyes: Negative for pain, redness and visual disturbance.  Respiratory: Negative for shortness of breath and wheezing.   Cardiovascular: Negative for chest pain and palpitations.  Gastrointestinal: Negative for diarrhea, blood in stool,  abdominal distention or other pain Genitourinary: Negative for hematuria, flank pain or change in urine volume.  Musculoskeletal: Negative for myalgias and joint swelling.  Skin: Negative for color change and wound.  Neurological: Negative for syncope and numbness. other than noted Hematological: Negative for adenopathy.  Psychiatric/Behavioral: Negative for hallucinations, self-injury, decreased concentration and agitation.      Objective:   Physical Exam BP  152/90  Pulse 67  Temp(Src) 98.1 F (36.7 C) (Oral)  Ht 5\' 3"  (1.6 m)  Wt 148 lb 6 oz (67.302 kg)  BMI 26.29 kg/m2  SpO2 97% VS noted,  Constitutional: Pt is oriented to person, place, and time. Appears well-developed and well-nourished.  Head: Normocephalic and atraumatic.  Right Ear: External ear normal.  Left Ear: External ear normal.  Nose: Nose normal.  Mouth/Throat: Oropharynx is clear and moist.  Eyes: Conjunctivae and EOM are normal. Pupils are equal, round, and reactive to light.  Neck: Normal range of motion. Neck supple. No JVD present. No tracheal deviation present.  Cardiovascular: Normal rate, regular rhythm, normal heart sounds and intact distal pulses.   Pulmonary/Chest: Effort normal and breath sounds normal.  Abdominal: Soft. Bowel sounds are normal. There is no tenderness. No HSM  Musculoskeletal: Normal range of motion. Exhibits no edema.  Lymphadenopathy:  Has no cervical adenopathy.  Neurological: Pt is alert and oriented to person, place, and time. Pt has normal reflexes. No cranial nerve deficit. At baseline confusion Skin: Skin is warm and dry.Several eczema lesions noted to extremities. Psychiatric:  Has  normal mood and affect. Behavior is normal.     Assessment & Plan:

## 2013-10-02 NOTE — Assessment & Plan Note (Signed)
Ok for depomedrol IM today, and steroid cr prn,  to f/u any worsening symptoms or concerns

## 2013-10-02 NOTE — Patient Instructions (Addendum)
You had the new Prevnar pneumonia shot today, as well as the steroid shot for the rash  Please take all new medication as prescribed - the low dose amlodipine 2.5 mg per day to help more with the blood pressure, cream for the eczema as needed, and the lipitor for cholesterol, and the xanax at night if needed for sleep  Please continue all other medications as before, and refills have been done if requested. Please have the pharmacy call with any other refills you may need.  Please continue your efforts at being more active, low cholesterol diet, and weight control. You are otherwise up to date with prevention measures today.  Please keep your appointments with your specialists as you may have planned  Please go to the LAB in the Basement (turn left off the elevator) for the tests to be done today, incluiding the Vit D , B12 and folate  You will be contacted by phone if any changes need to be made immediately.  Otherwise, you will receive a letter about your results with an explanation, but please check with MyChart first.  Please return in 6 months, or sooner if needed

## 2013-10-02 NOTE — Assessment & Plan Note (Signed)
To cont aricept, for b12, folate as well.

## 2013-10-02 NOTE — Progress Notes (Signed)
Pre visit review using our clinic review tool, if applicable. No additional management support is needed unless otherwise documented below in the visit note. 

## 2013-10-02 NOTE — Progress Notes (Signed)
[facility]  PATIENT: Teresa Riley DOB: [birth year]   REASON FOR VISIT: follow up seizure disorder and memory disorder   HISTORY OF PRESENT ILLNESS: Teresa Riley, 79 year old female returns for followup. She has a history of seizure disorder with last seizure occurring August 2014 she is currently on Keppra 1000 twice daily. She is also on Aricept 5 mg daily for memory loss. MRI of the brain the past has been markedly abnormal is suggestive of a vascular dementia. He is currently living with her daughter in [place]. She continues to walk for exercise. There have been no behavior issues, appetite is good and she is sleeping well. EEG in the past has shown slowing. No new medical issues, she returns for reevaluation  HISTORY: She has a history of seizure disorder with additional seizure occurring in August 2014. EEG showed slowing. She also has a markedly abnormal MRI most suggestive of a vascular dementia. She is taking Aricept 10 mg daily without side effects. She is currently taking Keppra 1000 twice a day. She lives with one of her daughters for one month and then switches to the other daughter. There are been no behavior issues, sleeps well at night appetite is good. She is walking for exercise . She has a history of seizure disorder. EEG showed slowing.  She lives with one of her daughters for one month and then switches to the other daughter. There are been no behavior issues, sleeps well at night appetite is good. She is walking for exercise, patient claims she was recently told she has an enlarged heart  REVIEW OF SYSTEMS: Full 14 system review of systems performed and notable only for those listed, all others are neg:  Constitutional: N/A  Cardiovascular: N/A  Ear/Nose/Throat: N/A  Skin: N/A  Eyes: N/A  Respiratory: N/A  Gastroitestinal: N/A  Hematology/Lymphatic: N/A  Endocrine: N/A Musculoskeletal:N/A  Allergy/Immunology: N/A  Neurological: Memory loss    Psychiatric: N/A   ALLERGIES: No Known Allergies  HOME MEDICATIONS: Outpatient Prescriptions Prior to Visit  Medication Sig Dispense Refill  . ALPRAZolam (XANAX) 0.25 MG tablet Take 1 tablet (0.25 mg total) by mouth at bedtime as needed for anxiety.  30 tablet  5  . amLODipine (NORVASC) 2.5 MG tablet Take 1 tablet (2.5 mg total) by mouth daily.  90 tablet  3  . aspirin 81 MG EC tablet Take 1 tablet (81 mg total) by mouth daily. Swallow whole.  30 tablet  12  . atorvastatin (LIPITOR) 10 MG tablet Take 1 tablet (10 mg total) by mouth daily.  90 tablet  3  . donepezil (ARICEPT) 5 MG tablet Take 1 tablet (5 mg total) by mouth daily.  30 tablet  6  . fluocinonide cream (LIDEX) 0.05 % Apply 1 application topically 2 (two) times daily.  30 g  1  . labetalol (NORMODYNE) 300 MG tablet Take 1 tablet (300 mg total) by mouth 2 (two) times daily.  180 tablet  3  . levETIRAcetam (KEPPRA) 1000 MG tablet Take 1 tablet (1,000 mg total) by mouth 2 (two) times daily.  60 tablet  12  . lisinopril (PRINIVIL,ZESTRIL) 40 MG tablet Take 1 tablet (40 mg total) by mouth daily.  90 tablet  3  . Polyethyl Glycol-Propyl Glycol (SYSTANE OP) Place 2-4 drops into both eyes 4 (four) times daily.      . potassium chloride (K-DUR,KLOR-CON) 10 MEQ tablet Take 1 tablet (10 mEq total) by mouth once.  90 tablet  3   No  facility-administered medications prior to visit.    PAST MEDICAL HISTORY: Past Medical History  Diagnosis Date  . Hypertension   . HTN (hypertension) 10/29/2010  . H/O: hysterectomy 10/29/2010  . Chronic constipation 10/29/2010  . Eczema 10/29/2010  . History of cervical cancer 10/29/2010  . Allergic rhinitis, cause unspecified 10/29/2010  . Hyperlipidemia   . Orthostatic hypotension 06/05/2012  . Impaired glucose tolerance 08/16/2012  . Memory loss     PAST SURGICAL HISTORY: Past Surgical History  Procedure Laterality Date  . Abdominal hysterectomy  1970  . Breast biopsy  1980's    benign  .  Cataract extraction w/ intraocular lens  implant, bilateral  01/2011  . Eye surgery      FAMILY HISTORY: Family History  Problem Relation Age of Onset  . Hypertension Mother   . Hypertension Father   . High blood pressure      SOCIAL HISTORY: History   Social History  . Marital Status: Divorced    Spouse Name: N/A    Number of Children: 3  . Years of Education: 2   Occupational History  . retired    Social History Main Topics  . Smoking status: Former Smoker    Types: Cigarettes  . Smokeless tobacco: Never Used     Comment: quit in 1973  . Alcohol Use: No  . Drug Use: No  . Sexual Activity: Not on file   Other Topics Concern  . Not on file   Social History Narrative   Patient lives at home with two daughters Teresa Riley) Teresa Riley). Patient is retired. Patient has two years college.     PHYSICAL EXAM  Filed Vitals:   10/02/13 1409  BP: 151/80  Pulse: 67  Height: 5\' 3"  (1.6 m)  Weight: 150 lb (68.04 kg)   Body mass index is 26.58 kg/(m^2).  Generalized: Well developed, in no acute distress , well groomed  Neurological examination   Mentation: Alert oriented to time, place, history taking. MMSE 30/30. AFT 18. Follows all commands speech and language fluent  Cranial nerve II-XII: Pupils were equal round reactive to light extraocular movements were full, visual field were full on confrontational test. Facial sensation and strength were normal. hearing was intact to finger rubbing bilaterally. Uvula tongue midline. head turning and shoulder shrug were normal and symmetric.Tongue protrusion into cheek strength was normal. Motor: normal bulk and tone, full strength in the BUE, BLE, fine finger movements normal, no pronator drift. No focal weakness Coordination: finger-nose-finger, heel-to-shin bilaterally, no dysmetria Reflexes: Brachioradialis 2/2, biceps 2/2, triceps 2/2, patellar 2/2, Achilles 2/2, plantar responses were flexor bilaterally. Gait and  Station: Rising up from seated position without assistance, normal stance,  moderate stride, good arm swing, smooth turning, able to perform tiptoe, and heel walking without difficulty. Tandem gait is steady. No assistive device  DIAGNOSTIC DATA (LABS, IMAGING, TESTING) - I reviewed patient records, labs, notes, testing and imaging myself where available.  Lab Results  Component Value Date   WBC 4.6 10/02/2013   HGB 12.2 10/02/2013   HCT 36.5 10/02/2013   MCV 94.2 10/02/2013   PLT 254.0 10/02/2013      Component Value Date/Time   NA 137 10/02/2013 1210   K 3.8 10/02/2013 1210   CL 99 10/02/2013 1210   CO2 30 10/02/2013 1210   GLUCOSE 92 10/02/2013 1210   BUN 19 10/02/2013 1210   CREATININE 1.2 10/02/2013 1210   CALCIUM 10.2 10/02/2013 1210   PROT 7.5 10/02/2013 1210  ALBUMIN 4.0 10/02/2013 1210   AST 29 10/02/2013 1210   ALT 15 10/02/2013 1210   ALKPHOS 58 10/02/2013 1210   BILITOT 1.0 10/02/2013 1210   GFRNONAA 59* 08/16/2012 1215   GFRAA 69* 08/16/2012 1215   Lab Results  Component Value Date   CHOL 261* 10/02/2013   HDL 92.50 10/02/2013   LDLCALC 155* 10/02/2013   LDLDIRECT 170.4 12/05/2012   TRIG 68.0 10/02/2013   CHOLHDL 3 10/02/2013    Lab Results  Component Value Date   VITAMINB12 924* 10/02/2013   Lab Results  Component Value Date   TSH 1.61 10/02/2013      ASSESSMENT AND PLAN  79 y.o. year old female  has a past medical history of Hypertension; HTN (hypertension) (10/29/2010); Hyperlipidemia; Orthostatic hypotension (06/05/2012); Seizure disorder and Memory loss. here to followup. Labs by Dr. Melvyn NovasJohns today   Continue Keppra 1000 mg twice daily Will refill Continue Aricept at 5 mg daily Will refill Walk for exercise and overall general health Followup in 6 months Nilda RiggsNancy Carolyn Atasha Colebank, [facility] New JerseyGNP, [facility] HospitalBC, APRN  [facility] [address], [address] [place], [place] [address] [phone]

## 2013-10-02 NOTE — Assessment & Plan Note (Signed)
Mild uncontrolled, to add amlod 2.5 qd,  to f/u any worsening symptoms or concerns

## 2013-10-02 NOTE — Assessment & Plan Note (Signed)

## 2013-10-02 NOTE — Patient Instructions (Addendum)
Continue Keppra 1000 mg twice daily Will refill Continue Aricept at 5 mg daily Will refill Walk for exercise and overall general health Followup in 6 months

## 2013-10-02 NOTE — Assessment & Plan Note (Signed)
Asympt, for a1c today 

## 2013-10-03 LAB — VITAMIN D 25 HYDROXY (VIT D DEFICIENCY, FRACTURES): VIT D 25 HYDROXY: 47 ng/mL (ref 30–89)

## 2013-12-07 ENCOUNTER — Other Ambulatory Visit: Payer: Self-pay | Admitting: Internal Medicine

## 2014-02-05 ENCOUNTER — Other Ambulatory Visit: Payer: Self-pay | Admitting: Internal Medicine

## 2014-02-13 NOTE — Telephone Encounter (Signed)
Noted  

## 2014-02-15 ENCOUNTER — Other Ambulatory Visit: Payer: Self-pay

## 2014-02-15 MED ORDER — DONEPEZIL HCL 5 MG PO TABS: 5.0000 mg | ORAL_TABLET | Freq: Every day | ORAL | Status: DC

## 2014-02-15 MED ORDER — LEVETIRACETAM 1000 MG PO TABS: 1000.0000 mg | ORAL_TABLET | Freq: Two times a day (BID) | ORAL | Status: DC

## 2014-02-19 ENCOUNTER — Other Ambulatory Visit: Payer: Self-pay | Admitting: *Deleted

## 2014-02-19 MED ORDER — LABETALOL HCL 300 MG PO TABS: ORAL_TABLET | ORAL | Status: DC

## 2014-02-19 MED ORDER — AMLODIPINE BESYLATE 2.5 MG PO TABS: 2.5000 mg | ORAL_TABLET | Freq: Every day | ORAL | Status: DC

## 2014-02-19 NOTE — Telephone Encounter (Signed)
Left msg on triage requesting refill on amlodipine 2.5 & HCTZ 25 mg. Pls call back ref# [id number]. [facility] spoke with Casimiro Needle rep approved amlodipine, per chart HCTZ has been d/c...Raechel Chute

## 2014-02-19 NOTE — Telephone Encounter (Signed)
Left msg on triage requesting refill on pt labetatolol 300 mg. Called back ref# [id number]. [facility] spoke with rep gave authorization for labetatol. Updated epic...Raechel Chute/lmb

## 2014-03-05 ENCOUNTER — Encounter: Payer: Self-pay | Admitting: Internal Medicine

## 2014-03-05 ENCOUNTER — Ambulatory Visit (INDEPENDENT_AMBULATORY_CARE_PROVIDER_SITE_OTHER): Payer: Medicaid Other | Admitting: Internal Medicine

## 2014-03-05 VITALS — BP 132/72 | HR 84 | Temp 97.8°F | Wt 152.8 lb

## 2014-03-05 DIAGNOSIS — R7302 Impaired glucose tolerance (oral): Secondary | ICD-10-CM

## 2014-03-05 DIAGNOSIS — J309 Allergic rhinitis, unspecified: Secondary | ICD-10-CM

## 2014-03-05 DIAGNOSIS — R0989 Other specified symptoms and signs involving the circulatory and respiratory systems: Secondary | ICD-10-CM

## 2014-03-05 DIAGNOSIS — R0609 Other forms of dyspnea: Secondary | ICD-10-CM | POA: Insufficient documentation

## 2014-03-05 DIAGNOSIS — I10 Essential (primary) hypertension: Secondary | ICD-10-CM

## 2014-03-05 DIAGNOSIS — R7309 Other abnormal glucose: Secondary | ICD-10-CM

## 2014-03-05 MED ORDER — ALBUTEROL SULFATE HFA 108 (90 BASE) MCG/ACT IN AERS: 2.0000 | INHALATION_SPRAY | Freq: Four times a day (QID) | RESPIRATORY_TRACT | Status: DC | PRN

## 2014-03-05 MED ORDER — FLUTICASONE PROPIONATE 50 MCG/ACT NA SUSP: 1.0000 | Freq: Every day | NASAL | Status: DC

## 2014-03-05 MED ORDER — METHYLPREDNISOLONE ACETATE 80 MG/ML IJ SUSP
80.0000 mg | Freq: Once | INTRAMUSCULAR | Status: AC
  Administered 2014-03-05: 80 mg via INTRAMUSCULAR

## 2014-03-05 NOTE — Progress Notes (Signed)
Pre visit review using our clinic review tool, if applicable. No additional management support is needed unless otherwise documented below in the visit note. 

## 2014-03-05 NOTE — Assessment & Plan Note (Signed)
stable overall by history and exam, recent data reviewed with pt, and pt to continue medical treatment as before,  to f/u any worsening symptoms or concerns BP Readings from Last 3 Encounters:  03/05/14 132/72  10/02/13 151/80  10/02/13 152/90

## 2014-03-05 NOTE — Assessment & Plan Note (Addendum)
ECG reviewed as per emr,  ? Etiology, cant r/o asthma mild intermittent with hx of eczema and allergies, for albut MDI prn, also depomedrol IM today may help as well, also for ecg, cxr, cbc, consider further eval for worsening s/s

## 2014-03-05 NOTE — Addendum Note (Signed)
Addended by: Scharlene Gloss B on: 03/05/2014 02:21 PM   Modules accepted: Orders

## 2014-03-05 NOTE — Addendum Note (Signed)
Addended by: Corwin Levins on: 03/05/2014 02:12 PM   Modules accepted: Orders

## 2014-03-05 NOTE — Assessment & Plan Note (Signed)
Asympt, for a1c 

## 2014-03-05 NOTE — Patient Instructions (Addendum)
You had the steroid shot today  Your EKG was OK today  Please take all new medication as prescribed - the Inhaler as needed, and the flonase nasal spray  Please continue all other medications as before, and refills have been done if requested.  Please have the pharmacy call with any other refills you may need.  Please keep your appointments with your specialists as you may have planned  Please go to the XRAY Department in the Basement (go straight as you get off the elevator) for the x-ray testing  Please go to the LAB in the Basement (turn left off the elevator) for the tests to be done today  You will be contacted by phone if any changes need to be made immediately.  Otherwise, you will receive a letter about your results with an explanation, but please check with MyChart first.  Please remember to sign up for MyChart if you have not done so, as this will be important to you in the future with finding out test results, communicating by private email, and scheduling acute appointments online when needed.  Please return in 6 months, or sooner if needed  OK to cancel the sept 2015 appt, unless you are not improved

## 2014-03-05 NOTE — Progress Notes (Signed)
Subjective:    Patient ID: Teresa Riley, female    DOB: 13-Dec-1934, 79 y.o.   MRN: [id number]  HPI  Here to f/u with daughter, c/o pt with dementia, but 1-2 mo sob/doe, walks daily in the AM 30 min, not slowed down by the sob/doe. Pt denies chest pain, wheezing, orthopnea, PND, increased LE swelling, palpitations, dizziness or syncope, no fever or cough.  Asks for nasal spray for allergy, no specific hx of asthma.   No overt  Bleeding or blood loss.  Has more balance issue lately, but no falls. Only taking lipitor "some" days.    Does not want to walk with cane or [place]bald.    Also more eczema recently, asks for repeat depomedrol., also Does have several wks ongoing nasal allergy symptoms with clearish congestion, itch and sneezing, without fever, pain, ST, cough, swelling or wheezing.   Pt denies fever, wt loss, night sweats, loss of appetite, or other constitutional symptoms  Pt denies polydipsia, polyuria  Past Medical History  Diagnosis Date  . Hypertension   . HTN (hypertension) 10/29/2010  . H/O: hysterectomy 10/29/2010  . Chronic constipation 10/29/2010  . Eczema 10/29/2010  . History of cervical cancer 10/29/2010  . Allergic rhinitis, cause unspecified 10/29/2010  . Hyperlipidemia   . Orthostatic hypotension 06/05/2012  . Impaired glucose tolerance 08/16/2012  . Memory loss    Past Surgical History  Procedure Laterality Date  . Abdominal hysterectomy  1970  . Breast biopsy  1980's    benign  . Cataract extraction w/ intraocular lens  implant, bilateral  01/2011  . Eye surgery      reports that she has quit smoking. Her smoking use included Cigarettes. She smoked 0.00 packs per day. She has never used smokeless tobacco. She reports that she does not drink alcohol or use illicit drugs. family history includes High blood pressure in an other family member; Hypertension in her father and mother. No Known Allergies Current Outpatient Prescriptions on File Prior to Visit  Medication  Sig Dispense Refill  . ALPRAZolam (XANAX) 0.25 MG tablet Take 1 tablet (0.25 mg total) by mouth at bedtime as needed for anxiety.  30 tablet  5  . amLODipine (NORVASC) 2.5 MG tablet Take 1 tablet (2.5 mg total) by mouth daily.  90 tablet  2  . aspirin 81 MG EC tablet Take 1 tablet (81 mg total) by mouth daily. Swallow whole.  30 tablet  12  . atorvastatin (LIPITOR) 10 MG tablet Take 1 tablet (10 mg total) by mouth daily.  90 tablet  3  . donepezil (ARICEPT) 5 MG tablet Take 1 tablet (5 mg total) by mouth daily.  90 tablet  1  . fluocinonide cream (LIDEX) 0.05 % Apply 1 application topically 2 (two) times daily.  30 g  1  . labetalol (NORMODYNE) 300 MG tablet take 1 tablet by mouth twice a day  180 tablet  2  . levETIRAcetam (KEPPRA) 1000 MG tablet Take 1 tablet (1,000 mg total) by mouth 2 (two) times daily.  180 tablet  1  . lisinopril (PRINIVIL,ZESTRIL) 40 MG tablet take 1 tablet by mouth once daily  90 tablet  3  . Polyethyl Glycol-Propyl Glycol (SYSTANE OP) Place 2-4 drops into both eyes 4 (four) times daily.      . potassium chloride (K-DUR,KLOR-CON) 10 MEQ tablet Take 1 tablet (10 mEq total) by mouth once.  90 tablet  3  . RESTASIS 0.05 % ophthalmic emulsion  No current facility-administered medications on file prior to visit.   Review of Systems  Constitutional: Negative for unusual diaphoresis or other sweats  HENT: Negative for ringing in ear Eyes: Negative for double vision or worsening visual disturbance.  Respiratory: Negative for choking and stridor.   Gastrointestinal: Negative for vomiting or other signifcant bowel change Genitourinary: Negative for hematuria or decreased urine volume.  Musculoskeletal: Negative for other MSK pain or swelling Skin: Negative for color change and worsening wound.  Neurological: Negative for tremors and numbness other than noted  Psychiatric/Behavioral: Negative for decreased concentration or agitation other than above       Objective:     Physical Exam BP 132/72  Pulse 84  Temp(Src) 97.8 F (36.6 C) (Oral)  Wt 152 lb 12 oz (69.287 kg)  SpO2 96% VS noted,  Constitutional: Pt appears well-developed, well-nourished.  HENT: Head: NCAT.  Right Ear: External ear normal.  Left Ear: External ear normal.  Eyes: . Pupils are equal, round, and reactive to light. Conjunctivae and EOM are normal Neck: Normal range of motion. Neck supple.  Cardiovascular: Normal rate and regular rhythm.   Pulmonary/Chest: Effort normal and breath sounds normal.- no rales or wheezing  Abd:  Soft, NT, ND, + BS Neurological: Pt is alert. Not confused , motor grossly intact Skin: Skin is warm. No rash, brusiing or bleeding Psychiatric: Pt behavior is normal. No agitation.     Assessment & Plan:

## 2014-03-05 NOTE — Addendum Note (Signed)
Addended by: Corwin Levins on: 03/05/2014 02:14 PM   Modules accepted: Orders

## 2014-03-06 ENCOUNTER — Telehealth: Payer: Self-pay | Admitting: Internal Medicine

## 2014-03-06 NOTE — Telephone Encounter (Signed)
Relevant patient education assigned to patient using Emmi. ° °

## 2014-03-11 ENCOUNTER — Ambulatory Visit (INDEPENDENT_AMBULATORY_CARE_PROVIDER_SITE_OTHER)
Admission: RE | Admit: 2014-03-11 | Discharge: 2014-03-11 | Disposition: A | Discharge status: Home / Self Care | Payer: Medicaid Other | Source: Ambulatory Visit | Attending: Internal Medicine | Admitting: Internal Medicine

## 2014-03-11 ENCOUNTER — Telehealth: Payer: Self-pay | Admitting: Internal Medicine

## 2014-03-11 ENCOUNTER — Other Ambulatory Visit (INDEPENDENT_AMBULATORY_CARE_PROVIDER_SITE_OTHER): Payer: Medicaid Other

## 2014-03-11 DIAGNOSIS — R0989 Other specified symptoms and signs involving the circulatory and respiratory systems: Secondary | ICD-10-CM

## 2014-03-11 DIAGNOSIS — R0609 Other forms of dyspnea: Secondary | ICD-10-CM

## 2014-03-11 DIAGNOSIS — J309 Allergic rhinitis, unspecified: Secondary | ICD-10-CM

## 2014-03-11 DIAGNOSIS — R7302 Impaired glucose tolerance (oral): Secondary | ICD-10-CM

## 2014-03-11 DIAGNOSIS — R7309 Other abnormal glucose: Secondary | ICD-10-CM

## 2014-03-11 LAB — CBC WITH DIFFERENTIAL/PLATELET
BASOS ABS: 0.1 10*3/uL (ref 0.0–0.1)
Basophils Relative: 1.2 % (ref 0.0–3.0)
Eosinophils Absolute: 0.1 10*3/uL (ref 0.0–0.7)
Eosinophils Relative: 1.6 % (ref 0.0–5.0)
HCT: 36.5 % (ref 36.0–46.0)
Hemoglobin: 12.1 g/dL (ref 12.0–15.0)
Lymphocytes Relative: 43 % (ref 12.0–46.0)
Lymphs Abs: 2.2 10*3/uL (ref 0.7–4.0)
MCHC: 33.2 g/dL (ref 30.0–36.0)
MCV: 94.5 fl (ref 78.0–100.0)
MONO ABS: 0.4 10*3/uL (ref 0.1–1.0)
MONOS PCT: 8.4 % (ref 3.0–12.0)
NEUTROS ABS: 2.3 10*3/uL (ref 1.4–7.7)
Neutrophils Relative %: 45.8 % (ref 43.0–77.0)
PLATELETS: 232 10*3/uL (ref 150.0–400.0)
RBC: 3.87 Mil/uL (ref 3.87–5.11)
RDW: 13.3 % (ref 11.5–15.5)
WBC: 5.1 10*3/uL (ref 4.0–10.5)

## 2014-03-11 LAB — HEMOGLOBIN A1C: HEMOGLOBIN A1C: 6 % (ref 4.6–6.5)

## 2014-03-11 NOTE — Telephone Encounter (Signed)
Patient is requesting a letter stating that is is able to get on work out equipment at the senior center here in [place].

## 2014-03-12 ENCOUNTER — Telehealth: Payer: Self-pay

## 2014-03-12 MED ORDER — SPACER/AERO CHAMBER MOUTHPIECE MISC: Status: DC

## 2014-03-12 NOTE — Telephone Encounter (Signed)
Aero chamber request sent in to pharmacy

## 2014-03-13 NOTE — Telephone Encounter (Signed)
Letter done per pt request.

## 2014-03-14 NOTE — Telephone Encounter (Signed)
Patients daughter informed letter in the mail

## 2014-03-15 DIAGNOSIS — Z0279 Encounter for issue of other medical certificate: Secondary | ICD-10-CM

## 2014-04-05 ENCOUNTER — Ambulatory Visit: Payer: Medicare HMO | Admitting: Internal Medicine

## 2014-04-05 ENCOUNTER — Encounter: Payer: Self-pay | Admitting: Neurology

## 2014-04-05 ENCOUNTER — Ambulatory Visit (INDEPENDENT_AMBULATORY_CARE_PROVIDER_SITE_OTHER): Payer: Medicare PPO | Admitting: Neurology

## 2014-04-05 VITALS — BP 121/74 | HR 65 | Ht 63.0 in | Wt 155.0 lb

## 2014-04-05 DIAGNOSIS — R413 Other amnesia: Secondary | ICD-10-CM

## 2014-04-05 DIAGNOSIS — G40109 Localization-related (focal) (partial) symptomatic epilepsy and epileptic syndromes with simple partial seizures, not intractable, without status epilepticus: Secondary | ICD-10-CM

## 2014-04-05 MED ORDER — DONEPEZIL HCL 10 MG PO TABS: 5.0000 mg | ORAL_TABLET | Freq: Every day | ORAL | Status: DC

## 2014-04-05 MED ORDER — LEVETIRACETAM 1000 MG PO TABS: 1000.0000 mg | ORAL_TABLET | Freq: Two times a day (BID) | ORAL | Status: DC

## 2014-04-05 MED ORDER — MEMANTINE HCL ER 7 & 14 & 21 &28 MG PO CP24: ORAL_CAPSULE | ORAL | Status: DC

## 2014-04-05 MED ORDER — DONEPEZIL HCL 10 MG PO TABS: 10.0000 mg | ORAL_TABLET | Freq: Every day | ORAL | Status: DC

## 2014-04-05 MED ORDER — MEMANTINE HCL ER 28 MG PO CP24: 28.0000 mg | ORAL_CAPSULE | Freq: Every day | ORAL | Status: DC

## 2014-04-05 NOTE — Progress Notes (Signed)
[facility]  PATIENT: Teresa Riley DOB: Jun 22, 1935   REASON FOR VISIT: follow up seizure disorder and memory disorder   HISTORY OF PRESENT ILLNESS: Teresa Riley, 79 year old female returns for followup. She is now living with her daughter Teresa Riley. She was referred by her primary care physician Dr. 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  She began to have seizure since 2013, she felt overheated, then become confused, blank stares, then slump down, body jerking movement.  She was put on Keppra, last seizure was in August 2014, she is now taking Keppra 1000 mg twice a day, tolerating it well, also taking Aricept 10 mg daily,  She now attends adult day care program, no longer driving, no gait difficulty,  MRI scan of the brain in 2014, shows extensive changes of chroniic microvascular ischemia and moderate degree of generalized cerebral atrophy and ventricular enlargement which appear slightly more advanced compared with previous MRI dated 06/03/2006.  EEG in the past showed left-sided slowing  REVIEW OF SYSTEMS: Full 14 system review of systems performed and notable only for those listed, all others are neg:  I pain, shortness of breath,  ALLERGIES: No Known Allergies  HOME MEDICATIONS: Outpatient Prescriptions Prior to Visit  Medication Sig Dispense Refill  . albuterol (PROVENTIL HFA;VENTOLIN HFA) 108 (90 BASE) MCG/ACT inhaler Inhale 2 puffs into the lungs every 6  (six) hours as needed for wheezing or shortness of breath.  1 Inhaler  5  . ALPRAZolam (XANAX) 0.25 MG tablet Take 1 tablet (0.25 mg total) by mouth at bedtime as needed for anxiety.  30 tablet  5  . amLODipine (NORVASC) 2.5 MG tablet Take 1 tablet (2.5 mg total) by mouth daily.  90 tablet  2  . aspirin 81 MG EC tablet Take 1 tablet (81 mg total) by mouth daily. Swallow whole.  30 tablet  12  . atorvastatin (LIPITOR) 10 MG tablet Take 1 tablet (10 mg total) by mouth daily.  90 tablet  3  . donepezil (ARICEPT) 5 MG tablet Take 1 tablet (5 mg total) by mouth daily.  90 tablet  1  . fluticasone (FLONASE) 50 MCG/ACT nasal spray Place 1 spray into both nostrils daily.  16 g  2  . labetalol (NORMODYNE) 300 MG tablet take 1 tablet by mouth twice a day  180 tablet  2  . levETIRAcetam (KEPPRA) 1000 MG tablet Take 1 tablet (1,000 mg total) by mouth 2 (two) times daily.  180 tablet  1  . lisinopril (PRINIVIL,ZESTRIL) 40 MG tablet take 1 tablet by mouth once daily  90 tablet  3  . Polyethyl Glycol-Propyl Glycol (SYSTANE OP) Place 2-4 drops into both eyes 4 (four) times daily.      . RESTASIS 0.05 % ophthalmic emulsion Place 1 drop into both eyes as needed.       Marland Kitchen Spacer/Aero Chamber Mouthpiece MISC To use with [employer].  1 each  1  . fluocinonide cream (LIDEX) 0.05 % Apply 1 application topically 2 (two) times daily.  30 g  1  . potassium chloride (K-DUR,KLOR-CON) 10 MEQ tablet Take 1 tablet (10 mEq total) by mouth once.  90 tablet  3   No facility-administered medications prior to visit.    PAST MEDICAL HISTORY: Past Medical History  Diagnosis Date  . Hypertension   . HTN (hypertension) 10/29/2010  . H/O: hysterectomy 10/29/2010  . Chronic constipation 10/29/2010  . Eczema 10/29/2010  . History of cervical cancer 10/29/2010  . Allergic rhinitis, cause unspecified 10/29/2010  . Hyperlipidemia   . Orthostatic hypotension 06/05/2012  . Impaired glucose tolerance 08/16/2012  . Memory loss     PAST  SURGICAL HISTORY: Past Surgical History  Procedure Laterality Date  . Abdominal hysterectomy  1970  . Breast biopsy  1980's    benign  . Cataract extraction w/ intraocular lens  implant, bilateral  01/2011  . Eye surgery      FAMILY HISTORY: Family History  Problem Relation Age of Onset  . Hypertension Mother   . Hypertension Father   . High blood pressure      SOCIAL HISTORY: History   Social History  . Marital Status: Divorced    Spouse Name: N/A    Number of Children: 3  . Years of Education: 2   Occupational History  . retired    Social History Main Topics  . Smoking status: Former Smoker    Types: Cigarettes  . Smokeless tobacco: Never Used     Comment: quit in 1973  . Alcohol Use: No  . Drug Use: No  . Sexual Activity: Not on file   Other Topics Concern  . Not on file   Social History Narrative   Patient lives at home with two daughters Wadie Lessen) Anda Latina). Patient is retired. Patient has two years college.     PHYSICAL EXAM  Filed Vitals:   04/05/14 1155  BP: 121/74  Pulse: 65  Height:  (1.6 m)  Weight: 155 lb (70.308 kg)   Body mass index is 27.46 kg/(m^2).  Generalized: Well developed, in no acute distress , well groomed  Neurological examination   Mentation: Alert oriented to time, place, history taking. MMSE 29/30. She missed 1 out of 3 recalls  Cranial nerve II-XII: Pupils were equal round reactive to light extraocular movements were full, visual field were full on confrontational test. Facial sensation and strength were normal. hearing was intact to finger rubbing bilaterally. Uvula tongue midline. head turning and shoulder shrug were normal and symmetric.Tongue protrusion into cheek strength was normal. Motor: normal bulk and tone, full strength in the BUE, BLE, fine finger movements normal, no pronator drift. No focal weakness Coordination: finger-nose-finger, heel-to-shin bilaterally, no dysmetria Reflexes:  Brachioradialis 2/2, biceps 2/2, triceps 2/2, patellar 2/2, Achilles 2/2, plantar responses were flexor bilaterally. Gait and Station: Rising up from seated position without assistance, normal stance,  moderate stride, good arm swing, smooth turning, able to perform tiptoe, and heel walking without difficulty. Tandem gait is steady. No assistive device  DIAGNOSTIC DATA (LABS, IMAGING, TESTING) - I reviewed patient records, labs, notes, testing and imaging myself where available.  Lab Results  Component Value Date   WBC 5.1 03/11/2014   HGB 12.1 03/11/2014   HCT 36.5 03/11/2014   MCV 94.5 03/11/2014   PLT 232.0 03/11/2014      Component Value Date/Time   NA 137 10/02/2013 1210   K 3.8 10/02/2013 1210   CL 99 10/02/2013 1210   CO2 30 10/02/2013 1210   GLUCOSE 92 10/02/2013  1210   BUN 19 10/02/2013 1210   CREATININE 1.2 10/02/2013 1210   CALCIUM 10.2 10/02/2013 1210   PROT 7.5 10/02/2013 1210   ALBUMIN 4.0 10/02/2013 1210   AST 29 10/02/2013 1210   ALT 15 10/02/2013 1210   ALKPHOS 58 10/02/2013 1210   BILITOT 1.0 10/02/2013 1210   GFRNONAA 59* 08/16/2012 1215   GFRAA 69* 08/16/2012 1215   Lab Results  Component Value Date   CHOL 261* 10/02/2013   HDL 92.50 10/02/2013   LDLCALC 155* 10/02/2013   LDLDIRECT 170.4 12/05/2012   TRIG 68.0 10/02/2013   CHOLHDL 3 10/02/2013    Lab Results  Component Value Date   VITAMINB12 924* 10/02/2013   Lab Results  Component Value Date   TSH 1.61 10/02/2013      ASSESSMENT AND PLAN  79 y.o. year old female  has a past medical history of hypertension, hyperlipidemia, complex partial seizure, mild memory trouble,    1, continue Keppra 1000 mg twice a day 2. Aricept 10 mg daily 3. Add on Namenda daily 4. Return to clinic in 6 months with [facility] [address] [place], [place] [address] [phone]

## 2014-04-08 ENCOUNTER — Other Ambulatory Visit: Payer: Self-pay | Admitting: Internal Medicine

## 2014-04-08 ENCOUNTER — Telehealth: Payer: Self-pay | Admitting: Nurse Practitioner

## 2014-04-08 NOTE — Telephone Encounter (Signed)
I called back to clarify info.  Spoke with Teresa Riley.  Since the Namenda Titration kit is on backorder, he wanted to change it to 3 separate Rx's, one for 26m, one for 112mand one for 2178m I asked if they could just dispense 7mg28mps with titration instructions instead so patient would not have to pay multiple co-pays.  He was agreeable to this and will process Rx this way.  They will call us b[place]ak if needed.

## 2014-04-08 NOTE — Telephone Encounter (Signed)
Ree Kida from [facility] calling about patient's script that needs to be rewritten as 3 separate scripts, please return call and advise.

## 2014-04-09 ENCOUNTER — Telehealth: Payer: Self-pay

## 2014-04-09 NOTE — Telephone Encounter (Signed)
[employer] notified us they have approved our request for [place] on [place] # [id number].  This approval is valid until the policy formulary changes or is terminated.

## 2014-05-02 ENCOUNTER — Ambulatory Visit (INDEPENDENT_AMBULATORY_CARE_PROVIDER_SITE_OTHER): Payer: Medicare PPO | Admitting: Neurology

## 2014-05-02 ENCOUNTER — Telehealth: Payer: Self-pay | Admitting: Neurology

## 2014-05-02 ENCOUNTER — Encounter: Payer: Self-pay | Admitting: Neurology

## 2014-05-02 VITALS — BP 180/87 | HR 62 | Ht 63.0 in | Wt 155.0 lb

## 2014-05-02 DIAGNOSIS — R413 Other amnesia: Secondary | ICD-10-CM

## 2014-05-02 DIAGNOSIS — R569 Unspecified convulsions: Secondary | ICD-10-CM

## 2014-05-02 DIAGNOSIS — I10 Essential (primary) hypertension: Secondary | ICD-10-CM

## 2014-05-02 MED ORDER — LEVETIRACETAM 1000 MG PO TABS: ORAL_TABLET | ORAL | Status: DC

## 2014-05-02 NOTE — Progress Notes (Signed)
[facility]  PATIENT: Teresa Riley DOB: 1935/02/08   REASON FOR VISIT: follow up seizure disorder and memory disorder   HISTORY OF PRESENT ILLNESS: Teresa Riley, 79 year old female returns for followup. She is now living with her daughter Teresa Riley. She was referred by her primary care physician Dr. 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  She began to have seizure since 2013, she felt overheated, then become confused, blank stares, then slump down, body jerking movement.  She was put on Keppra, last seizure was in August 2014, she is now taking Keppra 1000 mg twice a day, tolerating it well, also taking Aricept 10 mg daily,  She now attends adult day care program, no longer driving, no gait difficulty,  MRI scan of the brain in 2014, shows extensive changes of chroniic microvascular ischemia and moderate degree of generalized cerebral atrophy and ventricular enlargement which appear slightly more advanced compared with previous MRI dated 06/03/2006.  EEG in the past showed left-sided slowing  UPDATE Oct 22nd 2015: She had one seizure 7:30 AM this morning, witnessed by her daughter, she was ready to go out of the house, collaped on the floor, dazed into space, no incontinence, lasting for few minutes, EMS was called, blood pressure was stable 130 over 80  She has been taking Keppra 1000 mg twice a day since 2014, compliant with her medications, on titrating dose of  Namenda,  She is now back to her baseline  REVIEW OF SYSTEMS: Full 14 system review of systems performed and notable only for those listed, all others are neg:  As above  ALLERGIES: No Known Allergies  HOME MEDICATIONS: Outpatient Prescriptions Prior to Visit  Medication Sig Dispense Refill  . albuterol (PROVENTIL HFA;VENTOLIN HFA) 108 (90 BASE) MCG/ACT inhaler Inhale 2 puffs into the lungs every 6 (six) hours as needed for wheezing or shortness of breath.  1 Inhaler  5  . ALPRAZolam (XANAX) 0.25 MG tablet Take 1 tablet (0.25 mg total) by mouth at bedtime as needed for anxiety.  30 tablet  5  . amLODipine (NORVASC) 2.5 MG tablet Take 1 tablet (2.5 mg total) by mouth daily.  90 tablet  2  . aspirin 81 MG EC tablet Take 1 tablet (81 mg total) by mouth daily. Swallow whole.  30 tablet  12  . atorvastatin (LIPITOR) 10 MG tablet Take 1 tablet (10 mg total) by mouth daily.  90 tablet  3  . donepezil (ARICEPT) 10 MG tablet Take 1 tablet (10 mg total) by mouth daily.  90 tablet  3  . fluocinonide cream (LIDEX) 0.05 % APPLY 1 APPLICATION TOPICALLY 2 TIMES A DAY  30 g  1  . fluticasone (FLONASE) 50 MCG/ACT nasal spray Place 1 spray into both nostrils daily.  16 g  2  . labetalol (NORMODYNE) 300 MG tablet take 1 tablet by mouth twice a day  180 tablet  2  . levETIRAcetam (KEPPRA) 1000 MG tablet Take 1 tablet (1,000 mg total) by mouth 2 (two) times  daily.  180 tablet  3  . lisinopril (PRINIVIL,ZESTRIL) 40 MG tablet take 1 tablet by mouth once daily  90 tablet  3  . Memantine HCl ER 28 MG CP24 Take 28 mg by mouth daily.  30 capsule  11  . Memantine HCl ER 7 & 14 & 21 &28 MG CP24 Take as instructed.  28 capsule  0  . Polyethyl Glycol-Propyl Glycol (SYSTANE OP) Place 2-4 drops into both eyes 4 (four) times daily.      . potassium chloride (K-DUR,KLOR-CON) 10 MEQ tablet Take 10 mEq by mouth as needed.      . RESTASIS 0.05 % ophthalmic emulsion Place 1 drop into both eyes as needed.       Marland Kitchen. Spacer/Aero  Chamber Mouthpiece MISC To use with [employer].  1 each  1  . fluocinonide cream (LIDEX) 0.05 % Apply 1 application topically as needed.       No facility-administered medications prior to visit.    PAST MEDICAL HISTORY: Past Medical History  Diagnosis Date  . Hypertension   . HTN (hypertension) 10/29/2010  . H/O: hysterectomy 10/29/2010  . Chronic constipation 10/29/2010  . Eczema 10/29/2010  . History of cervical cancer 10/29/2010  . Allergic rhinitis, cause unspecified 10/29/2010  . Hyperlipidemia   . Orthostatic hypotension 06/05/2012  . Impaired glucose tolerance 08/16/2012  . Memory loss   . Seizures     PAST SURGICAL HISTORY: Past Surgical History  Procedure Laterality Date  . Abdominal hysterectomy  1970  . Breast biopsy  1980's    benign  . Cataract extraction w/ intraocular lens  implant, bilateral  01/2011  . Eye surgery      FAMILY HISTORY: Family History  Problem Relation Age of Onset  . Hypertension Mother   . Hypertension Father   . High blood pressure      SOCIAL HISTORY: History   Social History  . Marital Status: Divorced    Spouse Name: N/A    Number of Children: 3  . Years of Education: 2   Occupational History  . retired    Social History Main Topics  . Smoking status: Former Smoker    Types: Cigarettes  . Smokeless tobacco: Never Used     Comment: quit in 1973  . Alcohol Use: No  . Drug Use: No  . Sexual Activity: Not on file   Other Topics Concern  . Not on file   Social History Narrative   Patient lives at home with two daughters Teresa Lessen(Angela Riley) Teresa Riley(Freda Mitchell). Patient is retired. Patient has two years college.     PHYSICAL EXAM  Filed Vitals:   05/02/14 1310  BP: 180/87  Pulse: 62  Height: 5\' 3"  (1.6 m)  Weight: 155 lb (70.308 kg)   Body mass index is 27.46 kg/(m^2).  Generalized: Well developed, in no acute distress , well groomed  Neurological examination   Mentation: Alert oriented to time, place, history taking.   Tired looking elderly female  Cranial nerve II-XII: Pupils were equal round reactive to light extraocular movements were full, visual field were full on confrontational test. Facial sensation and strength were normal. hearing was intact to finger rubbing bilaterally. Uvula tongue midline. head turning and shoulder shrug were normal and symmetric.Tongue protrusion into cheek strength was normal. Motor: normal bulk and tone, full strength in the BUE, BLE, fine finger movements normal, no pronator drift. No focal weakness Coordination: finger-nose-finger, heel-to-shin bilaterally, no dysmetria Reflexes: Brachioradialis 2/2, biceps 2/2, triceps 2/2, patellar 2/2,  Achilles 2/2, plantar responses were flexor bilaterally. Gait and Station: Rising up from seated position by pushing on a chair arm, cautious, mildly unsteady  DIAGNOSTIC DATA (LABS, IMAGING, TESTING) - I reviewed patient records, labs, notes, testing and imaging myself where available.  Lab Results  Component Value Date   WBC 5.1 03/11/2014   HGB 12.1 03/11/2014   HCT 36.5 03/11/2014   MCV 94.5 03/11/2014   PLT 232.0 03/11/2014      Component Value Date/Time   NA 137 10/02/2013 1210   K 3.8 10/02/2013 1210   CL 99 10/02/2013 1210   CO2 30 10/02/2013 1210   GLUCOSE 92 10/02/2013 1210   BUN 19 10/02/2013 1210   CREATININE 1.2 10/02/2013 1210   CALCIUM 10.2 10/02/2013 1210   PROT 7.5 10/02/2013 1210   ALBUMIN 4.0 10/02/2013 1210   AST 29 10/02/2013 1210   ALT 15 10/02/2013 1210   ALKPHOS 58 10/02/2013 1210   BILITOT 1.0 10/02/2013 1210   GFRNONAA 59* 08/16/2012 1215   GFRAA 69* 08/16/2012 1215   Lab Results  Component Value Date   CHOL 261* 10/02/2013   HDL 92.50 10/02/2013   LDLCALC 155* 10/02/2013   LDLDIRECT 170.4 12/05/2012   TRIG 68.0 10/02/2013   CHOLHDL 3 10/02/2013    Lab Results  Component Value Date   VITAMINB12 924* 10/02/2013   Lab Results  Component Value Date   TSH 1.61 10/02/2013      ASSESSMENT AND PLAN  79 y.o. year  old female  has a past medical history of hypertension, hyperlipidemia, complex partial seizure, mild memory trouble,    1, increase Keppra to 1000 mg in the morning and 1500mg  pm 2. Aricept 10 mg daily 3. Keep titirating Namenda  4.  Home physical therapy. 5. RTC with Eber Jonesarolyn in March 2016.  Terrilyn SaverYijun Laurice Iglesia  [facility] [address], [address] [place], [place] [id number] [phone]

## 2014-05-02 NOTE — Telephone Encounter (Signed)
Patient's daughter called and stated patient had Seizure this am and questioning if she needs to bring her in.  Please call and advise.

## 2014-05-02 NOTE — Telephone Encounter (Signed)
Spoke to patients daughter [name] she is bringing her in for appt. Today. Patient had a seizure this morning.

## 2014-05-07 ENCOUNTER — Telehealth: Payer: Self-pay

## 2014-05-07 DIAGNOSIS — Z0282 Encounter for adoption services: Secondary | ICD-10-CM

## 2014-05-07 DIAGNOSIS — R413 Other amnesia: Secondary | ICD-10-CM

## 2014-05-07 DIAGNOSIS — R569 Unspecified convulsions: Secondary | ICD-10-CM

## 2014-05-07 NOTE — Telephone Encounter (Signed)
Re- order EEG. 

## 2014-05-08 ENCOUNTER — Encounter (HOSPITAL_COMMUNITY): Payer: Self-pay | Admitting: Emergency Medicine

## 2014-05-08 ENCOUNTER — Inpatient Hospital Stay (HOSPITAL_COMMUNITY)
Admission: EM | Admit: 2014-05-08 | Discharge: 2014-05-08 | Disposition: A | Discharge status: Home / Self Care | Payer: Medicare HMO | Source: Home / Self Care | Attending: Neurology | Admitting: Neurology

## 2014-05-08 ENCOUNTER — Emergency Department (HOSPITAL_COMMUNITY): Payer: Medicare HMO

## 2014-05-08 ENCOUNTER — Inpatient Hospital Stay (HOSPITAL_COMMUNITY): Payer: Medicare HMO

## 2014-05-08 ENCOUNTER — Inpatient Hospital Stay (HOSPITAL_COMMUNITY)
Admission: EM | Admit: 2014-05-08 | Discharge: 2014-05-10 | DRG: 101 | Disposition: A | Discharge status: Home / Self Care | Payer: Medicare HMO | Attending: Internal Medicine | Admitting: Internal Medicine

## 2014-05-08 DIAGNOSIS — Z7951 Long term (current) use of inhaled steroids: Secondary | ICD-10-CM

## 2014-05-08 DIAGNOSIS — Z7982 Long term (current) use of aspirin: Secondary | ICD-10-CM

## 2014-05-08 DIAGNOSIS — Z90710 Acquired absence of both cervix and uterus: Secondary | ICD-10-CM | POA: Diagnosis not present

## 2014-05-08 DIAGNOSIS — Z9849 Cataract extraction status, unspecified eye: Secondary | ICD-10-CM

## 2014-05-08 DIAGNOSIS — E785 Hyperlipidemia, unspecified: Secondary | ICD-10-CM | POA: Diagnosis present

## 2014-05-08 DIAGNOSIS — Z79899 Other long term (current) drug therapy: Secondary | ICD-10-CM

## 2014-05-08 DIAGNOSIS — I10 Essential (primary) hypertension: Secondary | ICD-10-CM

## 2014-05-08 DIAGNOSIS — R569 Unspecified convulsions: Secondary | ICD-10-CM

## 2014-05-08 DIAGNOSIS — Z8541 Personal history of malignant neoplasm of cervix uteri: Secondary | ICD-10-CM | POA: Diagnosis not present

## 2014-05-08 DIAGNOSIS — R413 Other amnesia: Secondary | ICD-10-CM

## 2014-05-08 DIAGNOSIS — Z87891 Personal history of nicotine dependence: Secondary | ICD-10-CM | POA: Diagnosis not present

## 2014-05-08 DIAGNOSIS — N179 Acute kidney failure, unspecified: Secondary | ICD-10-CM | POA: Diagnosis present

## 2014-05-08 DIAGNOSIS — G40909 Epilepsy, unspecified, not intractable, without status epilepticus: Principal | ICD-10-CM | POA: Diagnosis present

## 2014-05-08 DIAGNOSIS — F0390 Unspecified dementia without behavioral disturbance: Secondary | ICD-10-CM | POA: Diagnosis present

## 2014-05-08 LAB — CBC
HEMATOCRIT: 37 % (ref 36.0–46.0)
Hemoglobin: 12 g/dL (ref 12.0–15.0)
MCH: 31.2 pg (ref 26.0–34.0)
MCHC: 32.4 g/dL (ref 30.0–36.0)
MCV: 96.1 fL (ref 78.0–100.0)
Platelets: 189 10*3/uL (ref 150–400)
RBC: 3.85 MIL/uL — AB (ref 3.87–5.11)
RDW: 12.2 % (ref 11.5–15.5)
WBC: 5 10*3/uL (ref 4.0–10.5)

## 2014-05-08 LAB — BASIC METABOLIC PANEL
Anion gap: 10 (ref 5–15)
BUN: 20 mg/dL (ref 6–23)
CO2: 25 meq/L (ref 19–32)
Calcium: 9.7 mg/dL (ref 8.4–10.5)
Chloride: 101 mEq/L (ref 96–112)
Creatinine, Ser: 1.27 mg/dL — ABNORMAL HIGH (ref 0.50–1.10)
GFR calc Af Amer: 46 mL/min — ABNORMAL LOW (ref >90)
GFR calc non Af Amer: 40 mL/min — ABNORMAL LOW (ref >90)
GLUCOSE: 119 mg/dL — AB (ref 70–99)
POTASSIUM: 3.7 meq/L (ref 3.7–5.3)
SODIUM: 136 meq/L — AB (ref 137–147)

## 2014-05-08 LAB — URINALYSIS, ROUTINE W REFLEX MICROSCOPIC
BILIRUBIN URINE: NEGATIVE
Glucose, UA: NEGATIVE mg/dL
Hgb urine dipstick: NEGATIVE
Ketones, ur: NEGATIVE mg/dL
Leukocytes, UA: NEGATIVE
NITRITE: NEGATIVE
PH: 6 (ref 5.0–8.0)
Protein, ur: NEGATIVE mg/dL
Specific Gravity, Urine: 1.015 (ref 1.005–1.030)
Urobilinogen, UA: 0.2 mg/dL (ref 0.0–1.0)

## 2014-05-08 LAB — CBG MONITORING, ED: Glucose-Capillary: 120 mg/dL — ABNORMAL HIGH (ref 70–99)

## 2014-05-08 MED ORDER — ACETAMINOPHEN 650 MG RE SUPP: 650.0000 mg | Freq: Four times a day (QID) | RECTAL | Status: DC | PRN

## 2014-05-08 MED ORDER — AMLODIPINE BESYLATE 2.5 MG PO TABS
2.5000 mg | ORAL_TABLET | Freq: Every day | ORAL | Status: DC
  Administered 2014-05-09 – 2014-05-10 (×2): 2.5 mg via ORAL
  Filled 2014-05-08 (×3): qty 1

## 2014-05-08 MED ORDER — LEVETIRACETAM 500 MG PO TABS: 1000.0000 mg | ORAL_TABLET | Freq: Two times a day (BID) | ORAL | Status: DC

## 2014-05-08 MED ORDER — SODIUM CHLORIDE 0.9 % IV SOLN
Freq: Once | INTRAVENOUS | Status: AC
  Administered 2014-05-08: 10:00:00 via INTRAVENOUS

## 2014-05-08 MED ORDER — ADULT MULTIVITAMIN W/MINERALS CH
1.0000 | ORAL_TABLET | Freq: Every day | ORAL | Status: DC
  Administered 2014-05-08 – 2014-05-10 (×3): 1 via ORAL
  Filled 2014-05-08 (×3): qty 1

## 2014-05-08 MED ORDER — LACOSAMIDE 50 MG PO TABS
50.0000 mg | ORAL_TABLET | Freq: Once | ORAL | Status: AC
  Administered 2014-05-08: 50 mg via ORAL
  Filled 2014-05-08: qty 1

## 2014-05-08 MED ORDER — ALPRAZOLAM 0.25 MG PO TABS
0.2500 mg | ORAL_TABLET | Freq: Every evening | ORAL | Status: DC | PRN
  Administered 2014-05-08: 0.25 mg via ORAL
  Filled 2014-05-08 (×2): qty 1

## 2014-05-08 MED ORDER — HYDRALAZINE HCL 20 MG/ML IJ SOLN
10.0000 mg | Freq: Once | INTRAMUSCULAR | Status: AC
  Administered 2014-05-08: 10 mg via INTRAVENOUS
  Filled 2014-05-08: qty 1

## 2014-05-08 MED ORDER — SODIUM CHLORIDE 0.9 % IJ SOLN: 3.0000 mL | INTRAMUSCULAR | Status: DC | PRN

## 2014-05-08 MED ORDER — MEMANTINE HCL ER 28 MG PO CP24: 28.0000 mg | ORAL_CAPSULE | Freq: Every day | ORAL | Status: DC

## 2014-05-08 MED ORDER — LISINOPRIL 40 MG PO TABS: 40.0000 mg | ORAL_TABLET | Freq: Every day | ORAL | Status: DC

## 2014-05-08 MED ORDER — SODIUM CHLORIDE 0.9 % IV SOLN
Freq: Once | INTRAVENOUS | Status: AC
  Administered 2014-05-08: 13:00:00 via INTRAVENOUS

## 2014-05-08 MED ORDER — LORATADINE 10 MG PO TABS
10.0000 mg | ORAL_TABLET | Freq: Every day | ORAL | Status: DC
  Administered 2014-05-08 – 2014-05-10 (×3): 10 mg via ORAL
  Filled 2014-05-08 (×3): qty 1

## 2014-05-08 MED ORDER — CYCLOSPORINE 0.05 % OP EMUL
1.0000 [drp] | Freq: Every day | OPHTHALMIC | Status: DC
  Administered 2014-05-08: 1 [drp] via OPHTHALMIC
  Filled 2014-05-08 (×2): qty 1

## 2014-05-08 MED ORDER — LEVETIRACETAM 500 MG PO TABS
1000.0000 mg | ORAL_TABLET | Freq: Every morning | ORAL | Status: DC
Start: 2014-05-09 — End: 2014-05-09
  Administered 2014-05-09: 1000 mg via ORAL
  Filled 2014-05-08: qty 2

## 2014-05-08 MED ORDER — LEVETIRACETAM IN NACL 1000 MG/100ML IV SOLN
1000.0000 mg | Freq: Once | INTRAVENOUS | Status: AC
  Administered 2014-05-08: 1000 mg via INTRAVENOUS
  Filled 2014-05-08: qty 100

## 2014-05-08 MED ORDER — ONDANSETRON HCL 4 MG/2ML IJ SOLN
4.0000 mg | Freq: Four times a day (QID) | INTRAMUSCULAR | Status: DC | PRN
Start: 2014-05-08 — End: 2014-05-10

## 2014-05-08 MED ORDER — ACETAMINOPHEN 325 MG PO TABS
650.0000 mg | ORAL_TABLET | Freq: Four times a day (QID) | ORAL | Status: DC | PRN
  Administered 2014-05-08 – 2014-05-10 (×4): 650 mg via ORAL
  Filled 2014-05-08 (×4): qty 2

## 2014-05-08 MED ORDER — SODIUM CHLORIDE 0.9 % IJ SOLN: 3.0000 mL | Freq: Two times a day (BID) | INTRAMUSCULAR | Status: DC

## 2014-05-08 MED ORDER — SODIUM CHLORIDE 0.9 % IV BOLUS (SEPSIS)
1000.0000 mL | Freq: Once | INTRAVENOUS | Status: AC
  Administered 2014-05-08: 1000 mL via INTRAVENOUS

## 2014-05-08 MED ORDER — ALBUTEROL SULFATE (2.5 MG/3ML) 0.083% IN NEBU: 2.5000 mg | INHALATION_SOLUTION | Freq: Four times a day (QID) | RESPIRATORY_TRACT | Status: DC | PRN

## 2014-05-08 MED ORDER — ONDANSETRON HCL 4 MG PO TABS
4.0000 mg | ORAL_TABLET | Freq: Four times a day (QID) | ORAL | Status: DC | PRN
Start: 2014-05-08 — End: 2014-05-10

## 2014-05-08 MED ORDER — HYDROCHLOROTHIAZIDE 25 MG PO TABS: 25.0000 mg | ORAL_TABLET | Freq: Every day | ORAL | Status: DC

## 2014-05-08 MED ORDER — FLUTICASONE PROPIONATE 50 MCG/ACT NA SUSP
1.0000 | Freq: Every day | NASAL | Status: DC
  Filled 2014-05-08 (×2): qty 16

## 2014-05-08 MED ORDER — MEMANTINE HCL ER 7 MG PO CP24
28.0000 mg | ORAL_CAPSULE | Freq: Every day | ORAL | Status: DC
  Administered 2014-05-09 – 2014-05-10 (×2): 28 mg via ORAL
  Filled 2014-05-08 (×2): qty 4

## 2014-05-08 MED ORDER — SODIUM CHLORIDE 0.9 % IV SOLN: 250.0000 mL | INTRAVENOUS | Status: DC | PRN

## 2014-05-08 MED ORDER — LACOSAMIDE 50 MG PO TABS
50.0000 mg | ORAL_TABLET | Freq: Two times a day (BID) | ORAL | Status: DC
  Administered 2014-05-08 – 2014-05-10 (×4): 50 mg via ORAL
  Filled 2014-05-08 (×4): qty 1

## 2014-05-08 MED ORDER — ATORVASTATIN CALCIUM 10 MG PO TABS
10.0000 mg | ORAL_TABLET | Freq: Every day | ORAL | Status: DC
  Administered 2014-05-09 – 2014-05-10 (×2): 10 mg via ORAL
  Filled 2014-05-08 (×2): qty 1

## 2014-05-08 MED ORDER — SODIUM CHLORIDE 0.9 % IV SOLN
100.0000 mg | Freq: Once | INTRAVENOUS | Status: AC
  Administered 2014-05-08: 100 mg via INTRAVENOUS
  Filled 2014-05-08: qty 10

## 2014-05-08 MED ORDER — DONEPEZIL HCL 10 MG PO TABS
10.0000 mg | ORAL_TABLET | Freq: Every day | ORAL | Status: DC
  Administered 2014-05-09 – 2014-05-10 (×2): 10 mg via ORAL
  Filled 2014-05-08 (×2): qty 1

## 2014-05-08 MED ORDER — SODIUM CHLORIDE 0.9 % IJ SOLN
3.0000 mL | Freq: Two times a day (BID) | INTRAMUSCULAR | Status: DC
  Administered 2014-05-08 – 2014-05-10 (×4): 3 mL via INTRAVENOUS

## 2014-05-08 MED ORDER — LEVETIRACETAM 750 MG PO TABS
1500.0000 mg | ORAL_TABLET | Freq: Every day | ORAL | Status: DC
  Administered 2014-05-08: 1500 mg via ORAL
  Filled 2014-05-08 (×3): qty 2

## 2014-05-08 MED ORDER — SODIUM CHLORIDE 0.9 % IV SOLN
INTRAVENOUS | Status: AC
  Administered 2014-05-08: 15:00:00 via INTRAVENOUS

## 2014-05-08 MED ORDER — LABETALOL HCL 300 MG PO TABS
300.0000 mg | ORAL_TABLET | Freq: Two times a day (BID) | ORAL | Status: DC
  Administered 2014-05-08 – 2014-05-10 (×4): 300 mg via ORAL
  Filled 2014-05-08 (×5): qty 1

## 2014-05-08 MED ORDER — HYDRALAZINE HCL 25 MG PO TABS
25.0000 mg | ORAL_TABLET | Freq: Three times a day (TID) | ORAL | Status: DC
  Administered 2014-05-08 – 2014-05-10 (×6): 25 mg via ORAL
  Filled 2014-05-08 (×7): qty 1

## 2014-05-08 MED ORDER — ASPIRIN EC 81 MG PO TBEC
81.0000 mg | DELAYED_RELEASE_TABLET | Freq: Every day | ORAL | Status: DC
  Administered 2014-05-09 – 2014-05-10 (×2): 81 mg via ORAL
  Filled 2014-05-08 (×2): qty 1

## 2014-05-08 MED ORDER — HYDRALAZINE HCL 20 MG/ML IJ SOLN: 10.0000 mg | Freq: Four times a day (QID) | INTRAMUSCULAR | Status: DC | PRN

## 2014-05-08 NOTE — ED Notes (Signed)
Family reports that pts facial droop has resolved.

## 2014-05-08 NOTE — Progress Notes (Signed)
EEG completed, results pending. 

## 2014-05-08 NOTE — Procedures (Signed)
History: 79 yo F with known seizure disorder  Sedation: None  Technique: This is a 17 channel routine scalp EEG performed at the bedside with bipolar and monopolar montages arranged in accordance to the international 10/20 system of electrode placement. One channel was dedicated to EKG recording.    Background: There is a well defined posterior dominant rhythm of 11 Hz that attenuates with eye opening. The background consists of intermixed alpha and beta activities. Mu rhythm is noted. There is increased delta with drowsiness, but no clear sleep was recorded.   Photic stimulation: Physiologic driving is not performed  EEG Abnormalities: None  Clinical Interpretation: This normal EEG is recorded in the waking and drowsy state. There was no seizure or seizure predisposition recorded on this study. Please note that a normal EEG does not preclude the diagnosis of epilepsy.   Ritta SlotMcNeill Kirkpatrick, MD [facility] [phone]  If 7pm- 7am, please page neurology on call as listed in AMION.

## 2014-05-08 NOTE — ED Notes (Signed)
Patient transported to CT 

## 2014-05-08 NOTE — ED Notes (Signed)
Pt now reporting blurry vision. md made aware. md at bedside

## 2014-05-08 NOTE — ED Notes (Signed)
Neurologist at bedside. 

## 2014-05-08 NOTE — ED Notes (Signed)
Bed: WA24 Expected date:  Expected time:  Means of arrival:  Comments: EMS seizure 

## 2014-05-08 NOTE — Telephone Encounter (Signed)
I have called emergency room Dr. Elesa MassedWard, patient has recurrent prolonged seizure this morning, more than 20 minutes, she is taking Keppra 1000milligrams twice a day, creatinine 1.2 7 today,   I have suggested her to add on vimpat50 mg twice a day,  Annabelle HarmanDana, please call patient, keep her follow-up appointment in 2 weeks

## 2014-05-08 NOTE — Telephone Encounter (Signed)
Patient is in the hospital and I will call her to schedule follow up 05-10-2014

## 2014-05-08 NOTE — Consult Note (Signed)
Neurology Consultation Reason for Consult: Seizure Referring Physician: Ward, K  CC: Seizures  History is obtained from:patient, family  HPI: Teresa Riley is a 79 y.o. female with a history of seizures that manifest as LOC, sometimes with twitching and sometimes without. She had her typical seizure today, though it was quite prolonged lasting approximately 20 minutes. On EMS arrival, she was still unresponsive per daughter. Once she was in the ER, she had her eyes open, but appeared "glazed over" and would not speak. She has subsequently had some improvement, but continues to improve and is now responsive, but confused more than baseline per family.    LKW: 7 am  tpa given?: no, seizure at onset.     ROS: A 14 point ROS was performed and is negative except as noted in the HPI.   Past Medical History  Diagnosis Date  . Hypertension   . HTN (hypertension) 10/29/2010  . H/O: hysterectomy 10/29/2010  . Chronic constipation 10/29/2010  . Eczema 10/29/2010  . History of cervical cancer 10/29/2010  . Allergic rhinitis, cause unspecified 10/29/2010  . Hyperlipidemia   . Orthostatic hypotension 06/05/2012  . Impaired glucose tolerance 08/16/2012  . Memory loss   . Seizures     Family History: No hx sz  Social History: Tob: former smoker.   Exam: Current vital signs: BP 193/87  Pulse 60  Temp(Src) 97.6 F (36.4 C) (Oral)  Resp 12  SpO2 97% Vital signs in last 24 hours: Temp:  [97.6 F (36.4 C)] 97.6 F (36.4 C) (10/28 0850) Pulse Rate:  [55-61] 60 (10/28 1140) Resp:  [12-16] 12 (10/28 1140) BP: (162-198)/(78-87) 193/87 mmHg (10/28 1140) SpO2:  [96 %-100 %] 97 % (10/28 1140)  General: in bed, NAD CV: RRR Mental Status: Patient is awake, alert, oriented to person, "[place]ki", gives year but not month.  No signs of aphasia, ? Extinction to  Cranial Nerves: II: Visual Fields are full to finger movement, but question of decreased left lower field to finger counting. Pupils  are equal, round, and reactive to light.  Discs are difficult to visualize. III,IV, VI: EOMI without ptosis or diploplia.  V: Facial sensation is symmetric to temperature VII: Facial movement is symmetric.  VIII: hearing is intact to voice X: Uvula elevates symmetrically XI: Shoulder shrug is symmetric. XII: tongue is midline without atrophy or fasciculations.  Motor: Tone is normal. Bulk is normal. 5/5 strength was present in all four extremities.  Sensory: Sensation is symmetric to light touch and temperature in the arms and legs. No extinction to DSS.  Deep Tendon Reflexes: 2+ and symmetric in the biceps and patellae.  Cerebellar: FNF are intact bilaterally Gait: Not assessed due to acute nature of evaluation and multiple medical monitors in ED setting.    I have reviewed labs in epic and the results pertinent to this consultation are: CR - 1.27  I have reviewed the images obtained:CT head - no acute findings  Impression: 79 yo F with dementia and sz disorder presents with prolonged seizure that appears to have resolved. She continues to be mildly post-ictal. I agree with starting a second agent given her large dose of keppra.   With persistent symptoms and elevated BP, may be reasonable to perform MRI/EEG.   Recommendations: 1) keppra 1gm BID 2) vimpat 100mg  IV x 1, then 50mg  BID.  3) MRI/EEG 4) will continue to follow.    Ritta SlotMcNeill Josanna Hefel, MD [facility] [phone]  If 7pm- 7am, please page neurology on call  as listed in [place].

## 2014-05-08 NOTE — ED Notes (Signed)
Updated report given to Irving BurtonEmily, RN

## 2014-05-08 NOTE — ED Notes (Addendum)
Per EMS hx of seizures, 45 min ago pt had witnessed seizure, daughter found pt post ictal, responsive to pain. At present alert and oriented.   Upon rn assessment. Pt started on dementia medicine 4 weeks ago. Last week pt had seizure, Sheralyn BoatmanKepra was increased by 500mg . Before last week pt had not had seizure in 1 year. Family reports pt is not acting her normal self after a seizure and is much more confused.  Family reports that since ED arrival pt has had right sided facial droop. bil equal strength in hands and legs

## 2014-05-08 NOTE — ED Notes (Signed)
md made aware of facial droop.  md at bedside

## 2014-05-08 NOTE — ED Notes (Addendum)
Pt attempting to urinate on bed pan at present. Unable to give a sample at this time

## 2014-05-08 NOTE — H&P (Signed)
[facility] History and Physical  Teresa Riley DoneLucy M Vanlue ZOX:[id number] DOB: Apr 02, 1935 DOA: 05/08/2014  Referring physician: ER physician PCP: Oliver BarreJames [name], MD   Chief Complaint: seizure  HPI:  79 year old female with past medical history of seizures, hypertension, memory loss who [facility] after per family she had a seizure. It was not witness but family noted patient slumped over window and per family report she usually presents this way after she has a seizure. No reports of falls. No reports of respiratory distress. Patient does not recall she had a seizure. No complaints of chest pain, shortness of breath, palpitations. No abdominal pain, nausea or vomiting. No lightheadedness.  In ED, vitals are stable. No further seizures reported. Blood work showed creatinine of 1.27 otherwise unremarkable. Pt has been seen by neurology in consultation. Given Keppra 1000 mg IV once, Vimat 100 mg IV once. CT head and MRI brain showed no acute intracranial findings.   Assessment & Plan    Principal Problem:   Seizure  In pt with h/o seizures. CT head and MRI brain did not reveal acute intracranial findings.  Appreciate neurology consult. Recommendation to add vimpat. Continue keppra.  EEG during this hospital stay.   Neuro check per floor protocol  Cardiac monitor for 24 hours. Active Problems:   HTN (hypertension)  Resume Norvasc, hydralazine. Hold lisinopril and hctz due to acute kidney failure.   Hyperlipidemia  Resume statin therapy    Memory loss  Continue memantine and aricept   Acute renal failure  Likely due to lisinopril, hctz, those meds stopped  Continue IVF  Follow up BMP in am  DVT prophylaxis:   CSD' bilaterally    Radiological Exams on Admission: Ct Head Wo Contrast 05/08/2014 1. No acute intracranial pathology. 2. Mild ventriculomegaly which is likely secondary to cerebral cortical atrophy. An element of normal pressure hydrocephalus cannot be  excluded.     Mr Brain Wo Contrast 05/08/2014   Moderate to advanced atrophy. Ventricular enlargement felt to be due to atrophy.  Moderate to advanced chronic microvascular ischemia. No acute infarct or mass.     Code Status: Full Family Communication: Plan of care discussed with the patient and her family at the bedside  Disposition Plan: Admit for further evaluation  Manson PasseyEVINE, Lantz Hermann, MD  [facility] Pager [phone]  Review of Systems:  Constitutional: Negative for fever, chills and malaise/fatigue. Negative for diaphoresis.  HENT: Negative for hearing loss, ear pain, nosebleeds, congestion, sore throat, neck pain, tinnitus and ear discharge.   Eyes: Negative for blurred vision, double vision, photophobia, pain, discharge and redness.  Respiratory: Negative for cough, hemoptysis, sputum production, shortness of breath, wheezing and stridor.   Cardiovascular: Negative for chest pain, palpitations, orthopnea, claudication and leg swelling.  Gastrointestinal: Negative for nausea, vomiting and abdominal pain. Negative for heartburn, constipation, blood in stool and melena.  Genitourinary: Negative for dysuria, urgency, frequency, hematuria and flank pain.  Musculoskeletal: Negative for myalgias, back pain, joint pain and falls.  Skin: Negative for itching and rash.  Neurological: per HPI Endo/Heme/Allergies: Negative for environmental allergies and polydipsia. Does not bruise/bleed easily.  Psychiatric/Behavioral: Negative for suicidal ideas. The patient is not nervous/anxious.      Past Medical History  Diagnosis Date  . Hypertension   . HTN (hypertension) 10/29/2010  . H/O: hysterectomy 10/29/2010  . Chronic constipation 10/29/2010  . Eczema 10/29/2010  . History of cervical cancer 10/29/2010  . Allergic rhinitis, cause unspecified 10/29/2010  . Hyperlipidemia   .  Orthostatic hypotension 06/05/2012  . Impaired glucose tolerance 08/16/2012  . Memory loss   . Seizures    Past  Surgical History  Procedure Laterality Date  . Abdominal hysterectomy  1970  . Breast biopsy  1980's    benign  . Cataract extraction w/ intraocular lens  implant, bilateral  01/2011  . Eye surgery     Social History:  reports that she has quit smoking. Her smoking use included Cigarettes. She smoked 0.00 packs per day. She has never used smokeless tobacco. She reports that she does not drink alcohol or use illicit drugs.  No Known Allergies  Family History:  Family History  Problem Relation Age of Onset  . Hypertension Mother   . Hypertension Father   . High blood pressure       Prior to Admission medications   Medication Sig Start Date End Date Taking? Authorizing Provider  albuterol (PROVENTIL HFA;VENTOLIN HFA) 108 (90 BASE) MCG/ACT inhaler Inhale 2 puffs into the lungs every 6 (six) hours as needed for wheezing or shortness of breath. 03/05/14  Yes Corwin LevinsJames W [name], MD  ALPRAZolam Prudy Feeler(XANAX) 0.25 MG tablet Take 1 tablet (0.25 mg total) by mouth at bedtime as needed for anxiety. 10/02/13  Yes Corwin LevinsJames W [name], MD  amLODipine (NORVASC) 2.5 MG tablet Take 2.5 mg by mouth daily with breakfast.   Yes Historical Provider, MD  aspirin EC 81 MG tablet Take 81 mg by mouth daily with breakfast.   Yes Historical Provider, MD  atorvastatin (LIPITOR) 10 MG tablet Take 10 mg by mouth daily with breakfast.   Yes Historical Provider, MD  Calcium-Magnesium-Vitamin D (CALCIUM MAGNESIUM PO) Take 1 tablet by mouth daily.   Yes Historical Provider, MD  cycloSPORINE (RESTASIS) 0.05 % ophthalmic emulsion Place 1 drop into both eyes at bedtime.   Yes Historical Provider, MD  donepezil (ARICEPT) 10 MG tablet Take 10 mg by mouth daily with breakfast.   Yes Historical Provider, MD  fluticasone (FLONASE) 50 MCG/ACT nasal spray Place 1 spray into both nostrils daily. 03/05/14  Yes Corwin LevinsJames W [name], MD  hydrochlorothiazide (HYDRODIURIL) 25 MG tablet Take 25 mg by mouth daily with breakfast.   Yes Historical Provider, MD   Influenza Vac Split Quad (FLUZONE) 0.25 ML injection Inject 0.25 mLs into the muscle once.   Yes Historical Provider, MD  labetalol (NORMODYNE) 300 MG tablet Take 300 mg by mouth 2 (two) times daily.    Yes Historical Provider, MD  levETIRAcetam (KEPPRA) 1000 MG tablet Take 1,000-1,500 mg by mouth 2 (two) times daily. Takes 1000mg  in the morning and 1500mg  at night   Yes Historical Provider, MD  lisinopril (PRINIVIL,ZESTRIL) 40 MG tablet take 1 tablet by mouth once daily 02/05/14  Yes Corwin LevinsJames W [name], MD  loratadine (CLARITIN) 10 MG tablet Take 10 mg by mouth daily.   Yes Historical Provider, MD  Memantine HCl ER 7 & 14 & 21 &28 MG CP24 Take as instructed. 04/05/14  Yes Levert FeinsteinYijun Yan, MD  Multiple Vitamin (MULTIVITAMIN WITH MINERALS) TABS tablet Take 1 tablet by mouth daily.   Yes Historical Provider, MD  Omega-3 Fatty Acids (OMEGA 3 PO) Take 1 capsule by mouth daily.   Yes Historical Provider, MD  Polyethyl Glycol-Propyl Glycol (SYSTANE OP) Place 2-4 drops into both eyes 4 (four) times daily as needed (for dry eyes).    Yes Historical Provider, MD  Memantine HCl ER 28 MG CP24 Take 28 mg by mouth daily. 04/05/14   Levert FeinsteinYijun Yan, MD   Physical Exam:  Filed Vitals:   05/08/14 1240 05/08/14 1307 05/08/14 1332 05/08/14 1818  BP: 163/78 180/84 182/79 136/71  Pulse: 71 76 75   Temp:  97.6 F (36.4 C)    TempSrc:  Oral    Resp: 13 16    Height:  5\' 5"  (1.651 m)    Weight:  70.6 kg (155 lb 10.3 oz)    SpO2:  100%      Physical Exam  Constitutional: Appears well-developed and well-nourished. No distress.  HENT: Normocephalic. No tonsillar erythema or exudates Eyes: Conjunctivae and EOM are normal. PERRLA, no scleral icterus.  Neck: Normal ROM. Neck supple. No JVD. No tracheal deviation. No thyromegaly.  CVS: RRR, S1/S2 +, no murmurs, no gallops, no carotid bruit.  Pulmonary: Effort and breath sounds normal, no stridor, rhonchi, wheezes, rales.  Abdominal: Soft. BS +,  no distension, tenderness, rebound or  guarding.  Musculoskeletal: Normal range of motion. No edema and no tenderness.  Lymphadenopathy: No lymphadenopathy noted, cervical, inguinal. Neuro: Alert. Normal reflexes, muscle tone coordination. No focal neurologic deficits. Skin: Skin is warm and dry. No rash noted. Not diaphoretic. No erythema. No pallor.  Psychiatric: Normal mood and affect. Behavior, judgment, thought content normal.   Labs on Admission:  Basic Metabolic Panel:  Recent Labs Lab 05/08/14 0913  NA 136*  K 3.7  CL 101  CO2 25  GLUCOSE 119*  BUN 20  CREATININE 1.27*  CALCIUM 9.7   Liver Function Tests: No results found for this basename: AST, ALT, ALKPHOS, BILITOT, PROT, ALBUMIN,  in the last 168 hours No results found for this basename: LIPASE, AMYLASE,  in the last 168 hours No results found for this basename: AMMONIA,  in the last 168 hours CBC:  Recent Labs Lab 05/08/14 0913  WBC 5.0  HGB 12.0  HCT 37.0  MCV 96.1  PLT 189   Cardiac Enzymes: No results found for this basename: CKTOTAL, CKMB, CKMBINDEX, TROPONINI,  in the last 168 hours BNP: No components found with this basename: POCBNP,  CBG:  Recent Labs Lab 05/08/14 0906  GLUCAP 120*    If 7PM-7AM, please contact night-coverage [email or web address] Password [facility] 05/08/2014, 6:51 PM

## 2014-05-08 NOTE — ED Provider Notes (Signed)
TIME SEEN: 9:50 AM  CHIEF COMPLAINT: Seizure  HPI: Patient is a 79 year old female with history of hypertension, epilepsy on Keppra who presents to the emergency department with a seizure. Family reports that the patient had a seizure this morning where she slumped over in her chair and was out for approximately 20 minutes. Therefore this is normal for her seizures but that this will seemed more prolonged than normal. She also had a prolonged postictal state. She is still confused but slowly improving. Therefore that her last seizure was one week ago and she was seen by Dr. Terrace Arabia at [facility] and had her Keppra dose increased by 500mg  and is now on 1000 mg every morning and 1500 mg every night. They report that prior to this her last seizure was 13 months before this. They state that she has not missed any medications. She is not having any fevers, cough, vomiting or diarrhea. No drug alcohol use. No head injury. Family does report that the patient did not sleep well last night. Patient did have a right-sided facial droop that is new for her but this has also resolved.  ROS: See HPI Constitutional: no fever  Eyes: no drainage  ENT: no runny nose   Cardiovascular:  no chest pain  Resp: no SOB  GI: no vomiting GU: no dysuria Integumentary: no rash  Allergy: no hives  Musculoskeletal: no leg swelling  Neurological: no slurred speech ROS otherwise negative  PAST MEDICAL HISTORY/PAST SURGICAL HISTORY:  Past Medical History  Diagnosis Date  . Hypertension   . HTN (hypertension) 10/29/2010  . H/O: hysterectomy 10/29/2010  . Chronic constipation 10/29/2010  . Eczema 10/29/2010  . History of cervical cancer 10/29/2010  . Allergic rhinitis, cause unspecified 10/29/2010  . Hyperlipidemia   . Orthostatic hypotension 06/05/2012  . Impaired glucose tolerance 08/16/2012  . Memory loss   . Seizures     MEDICATIONS:  Prior to Admission medications   Medication Sig Start Date End Date Taking?  Authorizing Provider  albuterol (PROVENTIL HFA;VENTOLIN HFA) 108 (90 BASE) MCG/ACT inhaler Inhale 2 puffs into the lungs every 6 (six) hours as needed for wheezing or shortness of breath. 03/05/14   Corwin Levins, MD  ALPRAZolam Prudy Feeler) 0.25 MG tablet Take 1 tablet (0.25 mg total) by mouth at bedtime as needed for anxiety. 10/02/13   Corwin Levins, MD  amLODipine (NORVASC) 2.5 MG tablet Take 1 tablet (2.5 mg total) by mouth daily. 02/19/14   Corwin Levins, MD  aspirin 81 MG EC tablet Take 1 tablet (81 mg total) by mouth daily. Swallow whole. 06/05/12   Corwin Levins, MD  atorvastatin (LIPITOR) 10 MG tablet Take 1 tablet (10 mg total) by mouth daily. 10/02/13   Corwin Levins, MD  donepezil (ARICEPT) 10 MG tablet Take 1 tablet (10 mg total) by mouth daily. 04/05/14   Levert Feinstein, MD  fluocinonide cream (LIDEX) 0.05 % APPLY 1 APPLICATION TOPICALLY 2 TIMES A DAY 04/08/14   Corwin Levins, MD  fluticasone [facility]) 50 MCG/ACT nasal spray Place 1 spray into both nostrils daily. 03/05/14   Corwin Levins, MD  labetalol (NORMODYNE) 300 MG tablet take 1 tablet by mouth twice a day 02/19/14   Corwin Levins, MD  levETIRAcetam (KEPPRA) 1000 MG tablet One po qam, one and 1/2 tab po qhs 05/02/14   Levert Feinstein, MD  lisinopril (PRINIVIL,ZESTRIL) 40 MG tablet take 1 tablet by mouth once daily 02/05/14   Corwin Levins, MD  Memantine  HCl ER 28 MG CP24 Take 28 mg by mouth daily. 04/05/14   Levert FeinsteinYijun Yan, MD  Memantine HCl ER 7 & 14 & 21 &28 MG CP24 Take as instructed. 04/05/14   Levert FeinsteinYijun Yan, MD  Polyethyl Glycol-Propyl Glycol (SYSTANE OP) Place 2-4 drops into both eyes 4 (four) times daily.    Historical Provider, MD  potassium chloride (K-DUR,KLOR-CON) 10 MEQ tablet Take 10 mEq by mouth as needed. 10/02/13   Corwin LevinsJames W [name], MD  RESTASIS 0.05 % ophthalmic emulsion Place 1 drop into both eyes as needed.  07/19/13   Historical Provider, MD  Spacer/Aero Chamber Mouthpiece MISC To use with Proair. 03/12/14   Corwin LevinsJames W [name], MD    ALLERGIES:  No Known  Allergies  SOCIAL HISTORY:  History  Substance Use Topics  . Smoking status: Former Smoker    Types: Cigarettes  . Smokeless tobacco: Never Used     Comment: quit in 1973  . Alcohol Use: No    FAMILY HISTORY: Family History  Problem Relation Age of Onset  . Hypertension Mother   . Hypertension Father   . High blood pressure      EXAM: BP 162/80  Pulse 55  Temp(Src) 97.6 F (36.4 C) (Oral)  Resp 16  SpO2 100% CONSTITUTIONAL: Alert and oriented and responds appropriately to questions. Well-appearing; well-nourished HEAD: Normocephalic EYES: Conjunctivae clear, PERRL ENT: normal nose; no rhinorrhea; moist mucous membranes; pharynx without lesions noted NECK: Supple, no meningismus, no LAD  CARD: RRR; S1 and S2 appreciated; no murmurs, no clicks, no rubs, no gallops RESP: Normal chest excursion without splinting or tachypnea; breath sounds clear and equal bilaterally; no wheezes, no rhonchi, no rales, no hypoxia or respiratory distress ABD/GI: Normal bowel sounds; non-distended; soft, non-tender, no rebound, no guarding BACK:  The back appears normal and is non-tender to palpation, there is no CVA tenderness EXT: Normal ROM in all joints; non-tender to palpation; no edema; normal capillary refill; no cyanosis    SKIN: Normal color for age and race; warm NEURO: Moves all extremities equally; strength 5/5 in all 4 tremors, sensation to light touch intact diffusely, cranial nerves II through XII intact including no obvious facial droop, patient has no dysmetria to finger-to-nose testing bilaterally but is slow during this testing PSYCH: The patient's mood and manner are appropriate. Grooming and personal hygiene are appropriate.  MEDICAL DECISION MAKING: Patient here with a seizure that was prolonged compared to her baseline epilepsy and right-sided facial droop that has now resolved. And reports her last seizure was one week ago and she recently had her Keppra increase. She is now  back to baseline without complaints. Will check screening labs, urine to evaluate for organic causes for her seizure today but this may be secondary to lack of sleep recently.  Will also obtain head CT given change in nature of seizure with new neurologic deficits. We'll give IV Keppra bolus.  ED PROGRESS: Patient's labs are unremarkable other than a mildly elevated creatinine of 1.27. Will give IV fluids. Her CT head shows no acute change compared to 2012. She does have slight ventriculomegaly that is unchanged. Patient reports she is still having blurry vision and feels very confused. Family reports they do not feel she is at her baseline. I had initially discussed with her neurologist Dr. Terrace ArabiaYan who recommended placing the patient on Vimpat 50 mg twice daily with outpatient follow-up in 2-3 weeks. Family is not comfortable with this plan as they feel the patient is not back to  baseline and she states she is still not feeling well. Will discuss with our neuro hospitalist as patient may need admission, EEG, MRI.   11:17 AM  Spoke with Dr. Amada JupiterKirkpatrick with neuro hospitalist service who agrees with admission and will see the patient in the ED. Agrees with medicine admission. Will discuss with hospitalist.  11:30 AM  D/w Dr. Elisabeth Pigeonevine for admission to telemetry bed, inpatient.  Layla MawKristen N [place], DO 05/08/14 1130

## 2014-05-09 LAB — COMPREHENSIVE METABOLIC PANEL
ALT: 16 U/L (ref 0–35)
AST: 23 U/L (ref 0–37)
Albumin: 3.1 g/dL — ABNORMAL LOW (ref 3.5–5.2)
Alkaline Phosphatase: 70 U/L (ref 39–117)
Anion gap: 11 (ref 5–15)
BILIRUBIN TOTAL: 0.7 mg/dL (ref 0.3–1.2)
BUN: 17 mg/dL (ref 6–23)
CALCIUM: 9.3 mg/dL (ref 8.4–10.5)
CHLORIDE: 103 meq/L (ref 96–112)
CO2: 26 meq/L (ref 19–32)
CREATININE: 1.12 mg/dL — AB (ref 0.50–1.10)
GFR calc Af Amer: 53 mL/min — ABNORMAL LOW (ref >90)
GFR, EST NON AFRICAN AMERICAN: 46 mL/min — AB (ref >90)
GLUCOSE: 103 mg/dL — AB (ref 70–99)
Potassium: 3.9 mEq/L (ref 3.7–5.3)
Sodium: 140 mEq/L (ref 137–147)
Total Protein: 6.9 g/dL (ref 6.0–8.3)

## 2014-05-09 LAB — CBC
HCT: 36.3 % (ref 36.0–46.0)
Hemoglobin: 12 g/dL (ref 12.0–15.0)
MCH: 31.4 pg (ref 26.0–34.0)
MCHC: 33.1 g/dL (ref 30.0–36.0)
MCV: 95 fL (ref 78.0–100.0)
PLATELETS: 206 10*3/uL (ref 150–400)
RBC: 3.82 MIL/uL — ABNORMAL LOW (ref 3.87–5.11)
RDW: 12.2 % (ref 11.5–15.5)
WBC: 5.3 10*3/uL (ref 4.0–10.5)

## 2014-05-09 LAB — GLUCOSE, CAPILLARY: GLUCOSE-CAPILLARY: 101 mg/dL — AB (ref 70–99)

## 2014-05-09 MED ORDER — POLYETHYLENE GLYCOL 3350 17 G PO PACK
17.0000 g | PACK | Freq: Every day | ORAL | Status: DC
  Administered 2014-05-09 – 2014-05-10 (×2): 17 g via ORAL
  Filled 2014-05-09 (×2): qty 1

## 2014-05-09 MED ORDER — DIPHENHYDRAMINE HCL 50 MG/ML IJ SOLN
25.0000 mg | Freq: Once | INTRAMUSCULAR | Status: AC
  Administered 2014-05-09: 25 mg via INTRAVENOUS
  Filled 2014-05-09: qty 1

## 2014-05-09 MED ORDER — METOCLOPRAMIDE HCL 5 MG/ML IJ SOLN
10.0000 mg | Freq: Once | INTRAMUSCULAR | Status: AC
  Administered 2014-05-09: 10 mg via INTRAVENOUS
  Filled 2014-05-09: qty 2

## 2014-05-09 MED ORDER — LEVETIRACETAM 500 MG PO TABS
1000.0000 mg | ORAL_TABLET | Freq: Two times a day (BID) | ORAL | Status: DC
  Administered 2014-05-09 – 2014-05-10 (×2): 1000 mg via ORAL
  Filled 2014-05-09 (×2): qty 2

## 2014-05-09 NOTE — Progress Notes (Signed)
Patient ID: Teresa Riley Pherigo, female   DOB: [birth year], 79 y.o.   MRN: [id number]  [facility] PROGRESS NOTE  Teresa Riley Vaccarella WUJ:[id number] DOB: [birth year] DOA: 05/08/2014 PCP: Oliver BarreJames [name], MD  Brief narrative: 79 year old female with past medical history of seizures, hypertension, memory loss who [facility]08[facility]2014 after per family she had a seizure. It was not witness but family noted patient slumped over window and per family report she usually presents this way after she has a seizure. No reports of falls. No reports of respiratory distress. Patient does not recall she had a seizure. No complaints of chest pain, shortness of breath, palpitations. No abdominal pain, nausea or vomiting. No lightheadedness.   In ED, vitals are stable. No further seizures reported. Blood work showed creatinine of 1.27 otherwise unremarkable. Pt has been seen by neurology in consultation. Given Keppra 1000 mg IV once, Vimat 100 mg IV once. CT head and MRI brain showed no acute intracranial findings.   Assessment and Plan:   Principal Problem:  Seizure  In pt with h/o seizures. CT head and MRI brain did not reveal acute intracranial findings. Appreciate neurology consult. Recommendation is to continue Keppra 1000mg  BID, vimpat 50mg  BID  F/u with Dr. Terrace ArabiaYan as outpatient. PT evaluation requested  Active Problems:  HTN (hypertension)  Resumed Norvasc, hydralazine. Hold lisinopril and hctz due to acute kidney failure. Hyperlipidemia  Resumed statin therapy  Memory loss  Continue memantine and aricept Acute renal failure  Likely due to lisinopril, hctz, those meds held Resume upon discharge if renal function stable  Cr trending down  Follow up BMP in am  DVT prophylaxis:  CSD' bilaterally   Code Status: Full Family Communication: Pt and daughter at bedside Disposition Plan: Home when medically stable  IV Access:   Peripheral IV Procedures and diagnostic studies:   Ct Head Wo Contrast   05/08/2014  No acute intracranial pathology. Mild ventriculomegaly which is likely secondary to cerebral cortical atrophy. An element of normal pressure hydrocephalus cannot be excluded.   Mr Brain Wo Contrast  05/08/2014   Moderate to advanced atrophy. Ventricular enlargement felt to be due to atrophy.  Moderate to advanced chronic microvascular ischemia. No acute infarct or mass.    Medical Consultants:   Neurology  Other Consultants:   Physical therapy  Anti-Infectives:   None  Debbora PrestoMAGICK-Kathlene Yano, MD  [facility] Pager [phone]  If 7PM-7AM, please contact night-coverage [email or web address] Password [email or web address] 05/09/2014, 2:07 PM   LOS: 1 day   HPI/Subjective: No events overnight.   Objective: Filed Vitals:   05/09/14 0410 05/09/14 0859 05/09/14 1155 05/09/14 1401  BP: 151/75 133/64 111/52 114/56  Pulse: 71 72 72   Temp: 98.1 F (36.7 C)  97.9 F (36.6 C)   TempSrc: Oral  Oral   Resp: 20     Height:      Weight:      SpO2: 99% 99% 100%     Intake/Output Summary (Last 24 hours) at 05/09/14 1407 Last data filed at 05/09/14 0850  Gross per 24 hour  Intake 541.67 ml  Output    852 ml  Net -310.33 ml    Exam:   General:  Pt is alert, follows commands appropriately, not in acute distress  Cardiovascular: Regular rate and rhythm, S1/S2, no murmurs, no rubs, no gallops  Respiratory: Clear to auscultation bilaterally, no wheezing, no crackles, no rhonchi  Abdomen: Soft, non tender, non distended, bowel sounds present, no guarding  Extremities: No edema,  pulses DP and PT palpable bilaterally  Neuro: Grossly nonfocal  Data Reviewed: Basic Metabolic Panel:  Recent Labs Lab 05/08/14 0913 05/09/14 0351  NA 136* 140  K 3.7 3.9  CL 101 103  CO2 25 26  GLUCOSE 119* 103*  BUN 20 17  CREATININE 1.27* 1.12*  CALCIUM 9.7 9.3   Liver Function Tests:  Recent Labs Lab 05/09/14 0351  AST 23  ALT 16  ALKPHOS 70  BILITOT 0.7  PROT 6.9  ALBUMIN 3.1*   CBC:  Recent  Labs Lab 05/08/14 0913 05/09/14 0351  WBC 5.0 5.3  HGB 12.0 12.0  HCT 37.0 36.3  MCV 96.1 95.0  PLT 189 206   CBG:  Recent Labs Lab 05/08/14 0906 05/09/14 0730  GLUCAP 120* 101*   Scheduled Meds: . amLODipine  2.5 mg Oral Q breakfast  . aspirin EC  81 mg Oral Q breakfast  . atorvastatin  10 mg Oral Q breakfast  . cycloSPORINE  1 drop Both Eyes QHS  . donepezil  10 mg Oral Q breakfast  . fluticasone  1 spray Each Nare Daily  . hydrALAZINE  25 mg Oral 3 times per day  . labetalol  300 mg Oral BID  . lacosamide  50 mg Oral BID  . levETIRAcetam  1,000 mg Oral q morning - 10a  . levETIRAcetam  1,500 mg Oral QHS  . loratadine  10 mg Oral Daily  . Memantine HCl ER  28 mg Oral Daily   Continuous Infusions:

## 2014-05-09 NOTE — Evaluation (Signed)
Occupational Therapy Evaluation Patient Details Name: Nonnie DoneLucy M Wisner MRN: [id number] DOB: [birth year] Today's Date: 05/09/2014    History of Present Illness Pt is a 79 year old female with past medical history of seizures, hypertension, memory loss admitted 05/08/2014 for seizure.  MRI head: Moderate to advanced atrophy. Ventricular enlargement felt to be due atrophy.  No acute infarct.   Clinical Impression   This 79 year old female was admitted after a seizure.  At baseline, she lives with daughter and completes basic ADLs at supervision to independent level. Pt was fatiqued at time of evaluation and did not feel up to completing adl.  She needs overall min A for ADLs/toilet transfers for safety for balance.  She will benefit from skilled OT in acute setting and goals are set at supervision to min guard level.      Follow Up Recommendations  Supervision/Assistance - 24 hour    Equipment Recommendations  3 in 1 bedside comode (likely--will further assess)    Recommendations for Other Services       Precautions / Restrictions Precautions Precautions: Fall Restrictions Weight Bearing Restrictions: No      Mobility Bed Mobility              General bed mobility comments: not tested with OT  Transfers Overall transfer level: Needs assistance Equipment used: Rolling Engel (2 wheeled) Transfers: Sit to/from Stand Sit to Stand: Min assist;Min guard         General transfer comment: for safety:  pt a little unsteady, Min A to stand; min guard to sit    Balance Overall balance assessment: Needs assistance                            Comments:  Min guard for balance due to unsteadiness            ADL Overall ADL's : Needs assistance/impaired                         Toilet Transfer: Minimal assistance;Ambulation (chair)             General ADL Comments: Pt did not feel like completing ADL this pm--fatiqued.  Agreeable to sitting up in  chair.  Pt needs min A for LB ADLs for balance; she is able to reach to feet.  Educated her and daughter (not the one she lives with) on energy conservation--sitting when possible and balancing activities.  Pt would likely benefit from 3:1 commode.  Pt has nearly 24/7 at home     Vision                     Perception     Praxis      Pertinent Vitals/Pain Pain Assessment: No/denies pain     Hand Dominance     Extremity/Trunk Assessment Upper Extremity Assessment Upper Extremity Assessment: Overall WFL for tasks assessed          Communication Communication Communication: No difficulties   Cognition Arousal/Alertness: Awake/alert Behavior During Therapy: Flat affect Overall Cognitive Status: History of cognitive impairments - at baseline (decreased memory:  answered home questions incorrectly)                     General Comments       Exercises       Shoulder Instructions      Home Living Family/patient expects to be discharged to::  Private residence Living Arrangements: Children Available Help at Discharge: Family;Available 24 hours/day Type of Home: House Home Access: Stairs to enter Entergy CorporationEntrance Stairs-Number of Steps: 3-4   Home Layout: Able to live on main level with bedroom/bathroom     Bathroom Shower/Tub: Producer, television/film/videoWalk-in shower   Bathroom Toilet: Standard     Home Equipment: Shower seat - built in          Prior Functioning/Environment Level of Independence: Independent             OT Diagnosis: Generalized weakness   OT Problem List: Decreased strength;Decreased activity tolerance;Impaired balance (sitting and/or standing);Decreased cognition;Decreased safety awareness   OT Treatment/Interventions: Self-care/ADL training;DME and/or AE instruction;Patient/family education;Balance training;Cognitive remediation/compensation    OT Goals(Current goals can be found in the care plan section) Acute Rehab OT Goals Patient Stated Goal:  get back to being independent OT Goal Formulation: With patient Time For Goal Achievement: 05/23/14 Potential to Achieve Goals: Good ADL Goals Pt Will Perform Grooming: with supervision;standing Pt Will Transfer to Toilet: with min guard assist;ambulating;bedside commode Pt Will Perform Toileting - Clothing Manipulation and hygiene: with min guard assist;sit to/from stand Pt Will Perform Tub/Shower Transfer: with min guard assist;shower seat;Shower transfer Additional ADL Goal #1: pt will complete ADL at supervision level, sit to stand  OT Frequency: Min 2X/week   Barriers to D/C:            Co-evaluation              End of Session    Activity Tolerance: Patient limited by fatigue Patient left: in chair;with call bell/phone within reach;with chair alarm set;with family/visitor present   Time: 1429-1446 OT Time Calculation (min): 17 min Charges:  OT General Charges $OT Visit: 1 Procedure OT Evaluation $Initial OT Evaluation Tier I: 1 Procedure OT Treatments $Therapeutic Activity: 8-22 mins G-Codes:    Taren Toops 05/09/2014, 3:12 PM   Marica OtterMaryellen Coral Timme, OTR/L [phone] 05/09/2014

## 2014-05-09 NOTE — Progress Notes (Signed)
Subjective: Much improved  Exam: Filed Vitals:   05/09/14 0859  BP: 133/64  Pulse: 72  Temp:   Resp:    Gen: In bed, NAD MS: Awake, alert, oriented WJ:XBJYNCN:PERRL  Motor: MAEW Sensory:intact to LT   Impression: 79 yo F with breakthrough seizures now back to baseline.   Recommendations: 1) Keppra 1000mg  BID 2) vimpat 50mg  BID 3) F/u with Dr. Terrace ArabiaYan as outpatient. Please call with any further questions or concerns.   Ritta SlotMcNeill Mikael Debell, MD [facility] [phone]  If 7pm- 7am, please page neurology on call as listed in AMION.

## 2014-05-09 NOTE — Evaluation (Signed)
Physical Therapy Evaluation Patient Details Name: Nonnie DoneLucy M Putz MRN: [id number] DOB: 1935/04/07 Today's Date: 05/09/2014   History of Present Illness  Pt is a 79 year old female with past medical history of seizures, hypertension, memory loss admitted 05/08/2014 for seizure.  MRI head: Moderate to advanced atrophy. Ventricular enlargement felt to be due atrophy.  No acute infarct.  Clinical Impression  Pt currently with functional limitations due to the deficits listed below (see PT Problem List).  Pt will benefit from skilled PT to increase their independence and safety with mobility to allow discharge to the venue listed below.  Pt unsteady with gait so provided RW with improvement in steadiness observed.  Pt having difficulty with higher level balance activities during gait and would benefit from HHPT.  Daughter reports neurologist was considering this as well since pt has had balance difficulty with initial onset of seizures.     Follow Up Recommendations Home health PT    Equipment Recommendations  Rolling Boan with 5" wheels    Recommendations for Other Services       Precautions / Restrictions Precautions Precautions: Fall      Mobility  Bed Mobility Overal bed mobility: Modified Independent                Transfers Overall transfer level: Needs assistance Equipment used: 1 person hand held assist Transfers: Sit to/from Stand Sit to Stand: Min assist         General transfer comment: assist to steady with rise  Ambulation/Gait Ambulation/Gait assistance: Min guard Ambulation Distance (Feet): 180 Feet Assistive device: Rolling [place] (2 wheeled) Gait Pattern/deviations: Step-through pattern;Trunk flexed     General Gait Details: pt took a few steps to doorway and needing UE support to steady so provided RW, verbal cues for use of RW, improved steadiness with RW  Stairs            Wheelchair Mobility    Modified Rankin (Stroke Patients Only)       Balance Overall balance assessment: Needs assistance                           High level balance activites: Backward walking;Sudden stops;Turns;Head turns High Level Balance Comments: min/guard with RW while performing above activities during gait; slows down to perform activities and reports being challenging, veers toward side pt is looking with head turns             Pertinent Vitals/Pain Pain Assessment: No/denies pain    Home Living Family/patient expects to be discharged to:: Private residence Living Arrangements: Children (lives with daughter) Available Help at Discharge: Family;Available 24 hours/day Type of Home: House Home Access: Stairs to enter   [employer] CorporationEntrance Stairs-Number of Steps: 3-4 Home Layout: Able to live on main level with bedroom/bathroom Home Equipment: None      Prior Function Level of Independence: Independent               Hand Dominance        Extremity/Trunk Assessment   Upper Extremity Assessment: Defer to OT evaluation           Lower Extremity Assessment: Overall WFL for tasks assessed (good strength with testing)         Communication   Communication: No difficulties  Cognition Arousal/Alertness: Awake/alert Behavior During Therapy: Flat affect Overall Cognitive Status: Within Functional Limits for tasks assessed  General Comments      Exercises        Assessment/Plan    PT Assessment Patient needs continued PT services  PT Diagnosis Difficulty walking   PT Problem List Decreased activity tolerance;Decreased balance;Decreased mobility;Decreased knowledge of use of DME  PT Treatment Interventions DME instruction;Gait training;Functional mobility training;Stair training;Balance training;Neuromuscular re-education;Therapeutic activities;Therapeutic exercise;Patient/family education   PT Goals (Current goals can be found in the Care Plan section) Acute Rehab PT Goals PT  Goal Formulation: With patient/family Time For Goal Achievement: 05/16/14 Potential to Achieve Goals: Good    Frequency Min 3X/week   Barriers to discharge        Co-evaluation               End of Session Equipment Utilized During Treatment: Gait belt Activity Tolerance: Patient tolerated treatment well Patient left: in chair;Other (comment);with family/visitor present (with OT, brought OT chair alarm pad)           Time: 4098-11911420-1432 PT Time Calculation (min): 12 min   Charges:   PT Evaluation $Initial PT Evaluation Tier I: 1 Procedure PT Treatments $Gait Training: 8-22 mins   PT G Codes:          Hibo Blasdell,KATHrine E 05/09/2014, 2:49 PM Zenovia JarredKati Emalia Witkop, PT, DPT 05/09/2014 Pager: [phone]

## 2014-05-10 ENCOUNTER — Other Ambulatory Visit: Payer: Medicare PPO | Admitting: Radiology

## 2014-05-10 LAB — CBC
HCT: 35 % — ABNORMAL LOW (ref 36.0–46.0)
HEMOGLOBIN: 11.4 g/dL — AB (ref 12.0–15.0)
MCH: 30.9 pg (ref 26.0–34.0)
MCHC: 32.6 g/dL (ref 30.0–36.0)
MCV: 94.9 fL (ref 78.0–100.0)
PLATELETS: 199 10*3/uL (ref 150–400)
RBC: 3.69 MIL/uL — AB (ref 3.87–5.11)
RDW: 12.2 % (ref 11.5–15.5)
WBC: 4.6 10*3/uL (ref 4.0–10.5)

## 2014-05-10 LAB — BASIC METABOLIC PANEL
ANION GAP: 10 (ref 5–15)
BUN: 18 mg/dL (ref 6–23)
CALCIUM: 9.3 mg/dL (ref 8.4–10.5)
CO2: 27 mEq/L (ref 19–32)
Chloride: 106 mEq/L (ref 96–112)
Creatinine, Ser: 1.07 mg/dL (ref 0.50–1.10)
GFR calc Af Amer: 57 mL/min — ABNORMAL LOW (ref >90)
GFR, EST NON AFRICAN AMERICAN: 49 mL/min — AB (ref >90)
GLUCOSE: 95 mg/dL (ref 70–99)
POTASSIUM: 3.8 meq/L (ref 3.7–5.3)
SODIUM: 143 meq/L (ref 137–147)

## 2014-05-10 LAB — GLUCOSE, CAPILLARY: GLUCOSE-CAPILLARY: 148 mg/dL — AB (ref 70–99)

## 2014-05-10 MED ORDER — LACOSAMIDE 50 MG PO TABS: 50.0000 mg | ORAL_TABLET | Freq: Two times a day (BID) | ORAL | Status: DC

## 2014-05-10 MED ORDER — LEVETIRACETAM 1000 MG PO TABS
1000.0000 mg | ORAL_TABLET | Freq: Two times a day (BID) | ORAL | Status: DC
Start: 2014-05-10 — End: 2014-05-17

## 2014-05-10 MED ORDER — ALPRAZOLAM 0.25 MG PO TABS: 0.2500 mg | ORAL_TABLET | Freq: Every evening | ORAL | Status: DC | PRN

## 2014-05-10 NOTE — Progress Notes (Signed)
Call placed to K. Schorr, NP regarding patient complaint of eye pain and sensitivity after getting PO tylenol. Orders placed by on call for IV benedryl and IV reglan.

## 2014-05-10 NOTE — Progress Notes (Signed)
Occupational Therapy Treatment Patient Details Name: Nonnie DoneLucy M Gaiser MRN: [id number] DOB: April 19, 1935 Today's Date: 05/10/2014    History of present illness Pt is a 79 year old female with past medical history of seizures, hypertension, memory loss admitted 05/08/2014 for seizure.  MRI head: Moderate to advanced atrophy. Ventricular enlargement felt to be due atrophy.  No acute infarct.   OT comments  Pt improved since yesterday. Still recommend 24/7 assistance    Follow Up Recommendations  Supervision/Assistance - 24 hour    Equipment Recommendations  None recommended by OT    Recommendations for Other Services      Precautions / Restrictions Precautions Precautions: Fall Restrictions Weight Bearing Restrictions: No       Mobility Bed Mobility                  Transfers   Equipment used: Rolling [place] (2 wheeled) Transfers: Sit to/from Stand Sit to Stand: Min guard         General transfer comment: for safety; did not use RW to get up from commode    Balance                                   ADL                           Toilet Transfer: Min guard;Ambulation;RW       Tub/ Shower Transfer: Min guard;Ambulation;Shower seat;Walk-in shower (had to simulate ledge as room had accessible shower)     General ADL Comments: pt is not used to using a RW; ambulated back to chair without this and pt tended to furniture walk. Gait belt used--no LOB.  Pt had already washed up and put pants on this am.  Daughter present and assisted her.  Pt with brighter affect this am and moving better.  Pt not back to baseline, but improving      Vision                     Perception     Praxis      Cognition   Behavior During Therapy: [facility] LtdWFL for tasks assessed/performed Overall Cognitive Status: History of cognitive impairments - at baseline                       Extremity/Trunk Assessment               Exercises      Shoulder Instructions       General Comments      Pertinent Vitals/ Pain       Pain Assessment: No/denies pain  Home Living                                          Prior Functioning/Environment              Frequency       Progress Toward Goals  OT Goals(current goals can now be found in the care plan section)  Progress towards OT goals: Progressing toward goals     Plan      Co-evaluation                 End of Session     Activity Tolerance Patient tolerated treatment  well   Patient Left in chair;with call bell/phone within reach;with family/visitor present   Nurse Communication          Time: [id number] OT Time Calculation (min): 10 min  Charges: OT General Charges $OT Visit: 1 Procedure OT Treatments $Self Care/Home Management : 8-22 mins  Lanier Felty 05/10/2014, 10:04 AM  Marica OtterMaryellen Weslee Fogg, OTR/L [phone] 05/10/2014

## 2014-05-10 NOTE — Discharge Instructions (Signed)

## 2014-05-10 NOTE — Progress Notes (Signed)
Patient discharged home with daughter, discharge instructions given and explained to patient/daughters and they verbalized understanding, denies any pain/distress. No wound noted, skin intact. Accompanied home by daughter.

## 2014-05-10 NOTE — Progress Notes (Signed)
CARE MANAGEMENT NOTE 05/10/2014  Patient:  Nonnie DoneWALKER,Brandin M   Account Number:  [id number]  Date Initiated:  05/10/2014  Documentation initiated by:  Trinna BalloonMcGIBBONEY,COOKIE Donato Studley  Subjective/Objective Assessment:   pt admitted with cco seizure     Action/Plan:   from home   Anticipated DC Date:  05/10/2014   Anticipated DC Plan:  HOME W HOME HEALTH SERVICES      DC Planning Services  CM consult      Choice offered to / List presented to:  C-4 Adult Children   DME arranged  [facility]      DME agency  [facility]     HH arranged  HH-1 RN  HH-2 PT  HH-4 NURSE'S AIDE      HH agency  [facility]   Status of service:  Completed, signed off Medicare Important Message given?  NA - LOS <3 / Initial given by admissions (If response is "NO", the following Medicare IM given date fields will be blank) Date Medicare IM given:   Medicare IM given by:   Date Additional Medicare IM given:   Additional Medicare IM given by:    Discharge Disposition:  HOME W HOME HEALTH SERVICES  Per UR Regulation:  Reviewed for med. necessity/level of care/duration of stay  If discussed at Long Length of Stay Meetings, dates discussed:    Comments:  05/08/14 MMcGibboney, RN, BSN Spoke with pt's daughter concerning HH and DME. D[facility] pt [facility]referral given to in house rep. Pt's Ambulance personinsurance contract with [facility] for DME. Information called and faxed to [facility] [phone] office, [phone] fax.

## 2014-05-10 NOTE — Progress Notes (Signed)
Physical Therapy Treatment Patient Details Name: Nonnie DoneLucy M Eckroth MRN: [id number] DOB: September 27, 1934 Today's Date: 05/10/2014    History of Present Illness Pt is a 79 year old female with past medical history of seizures, hypertension, memory loss admitted 05/08/2014 for seizure.  MRI head: Moderate to advanced atrophy. Ventricular enlargement felt to be due atrophy.  No acute infarct.    PT Comments    Assisted to BR then amb in hallway using SPC.  Also performed BERG balance Test in which pt scored 46/56 indicating Mod Risk for falls need for AD.  Great difficulty was one leg stance, tandum step and alternating step up on stool.  Pt performed best using cane vs Anand.    Follow Up Recommendations  Home health PT     Equipment Recommendations  Gilmer MorCane (reported to RN)    Recommendations for Other Services       Precautions / Restrictions Precautions Precautions: Fall Precaution Comments: Hx Sz Restrictions Weight Bearing Restrictions: No    Mobility  Bed Mobility               General bed mobility comments: Pt OOB in recliner  Transfers Overall transfer level: Needs assistance Equipment used: None Transfers: Sit to/from Stand;Stand Pivot Transfers Sit to Stand: Supervision;Min guard Stand pivot transfers: Supervision;Min guard       General transfer comment: one VC safety with turns   Ambulation/Gait Ambulation/Gait assistance: Supervision;Min guard Ambulation Distance (Feet): 200 Feet Assistive device: Straight cane;Rolling [place] (2 wheeled) Gait Pattern/deviations: Step-through pattern Gait velocity: WFL   General Gait Details: amb with SPC with 25% VC's on proper sequencing and placement.  No LOB.  Tolerated well.    Stairs            Wheelchair Mobility    Modified Rankin (Stroke Patients Only)       Balance Overall balance assessment: Needs assistance                                  Cognition Arousal/Alertness:  Awake/alert Behavior During Therapy: WFL for tasks assessed/performed Overall Cognitive Status: History of cognitive impairments - at baseline                      Exercises      General Comments General comments (skin integrity, edema, etc.): Moderate fall risk, needs AD      Pertinent Vitals/Pain Pain Assessment: No/denies pain    Home Living                      Prior Function            PT Goals (current goals can now be found in the care plan section) Progress towards PT goals: Progressing toward goals    Frequency  Min 3X/week    PT Plan      Co-evaluation             End of Session Equipment Utilized During Treatment: Gait belt Activity Tolerance: Patient tolerated treatment well Patient left: in chair;Other (comment);with family/visitor present     Time: 1043-1110 PT Time Calculation (min): 27 min  Charges:  $Gait Training: 8-22 mins $Therapeutic Activity: 8-22 mins                    G Codes:      Felecia ShellingLori Zakk Borgen  PTA [facility]  Acute  Rehab Pager  [phone]  

## 2014-05-10 NOTE — Telephone Encounter (Signed)
Patient has been scheduled with Dr.Yan next Tues.

## 2014-05-10 NOTE — Discharge Summary (Signed)
Physician Discharge Summary  Teresa Riley [id number] DOB: May 19, 1935 DOA: 05/08/2014  PCP: Oliver BarreJames [name], MD  Admit date: 05/08/2014 Discharge date: 05/10/2014  Recommendations for Outpatient Follow-up:  1. Pt will need to follow up with PCP in 2-3 weeks post discharge 2. Please obtain BMP to evaluate electrolytes and kidney function 3. Please also check CBC to evaluate Hg and Hct levels 4. Vimpat started per neurology recommendations   Discharge Diagnoses:  Principal Problem:   Seizure Active Problems:   HTN (hypertension)   Hyperlipidemia   Memory loss   Acute renal failure  Discharge Condition: Stable  Diet recommendation: Heart healthy diet discussed in details   Brief narrative:  79 year old female with past medical history of seizures, hypertension, memory loss who [facility]2014 after per family she had a seizure. It was not witness but family noted patient slumped over window and per family report she usually presents this way after she has a seizure. No reports of falls. No reports of respiratory distress. Patient does not recall she had a seizure. No complaints of chest pain, shortness of breath, palpitations. No abdominal pain, nausea or vomiting. No lightheadedness.   In ED, vitals are stable. No further seizures reported. Blood work showed creatinine of 1.27 otherwise unremarkable. Pt has been seen by neurology in consultation. Given Keppra 1000 mg IV once, Vimat 100 mg IV once. CT head and MRI brain showed no acute intracranial findings.   Assessment and Plan:   Principal Problem:  Seizure  In pt with h/o seizures. CT head and MRI brain did not reveal acute intracranial findings.  Appreciate neurology consult. Recommendation is to continue Keppra 1000mg  BID, vimpat 50mg  BID  F/u with Dr. Terrace ArabiaYan as outpatient.  Stable [facility]  Active Problems:  HTN (hypertension)  Resumed Norvasc, hydralazine. Continue Lisinopril and HCTZ as per  home medical regimen  Hyperlipidemia  Resumed statin therapy  Memory loss  Continue memantine and aricept Acute renal failure  Cr now WNL, resume home medical regimen   Code Status: Full  Family Communication: Pt and daughter at bedside  Disposition Plan: Home   IV Access:   Peripheral IV Procedures and diagnostic studies:   Ct Head Wo Contrast 05/08/2014 No acute intracranial pathology. Mild ventriculomegaly which is likely secondary to cerebral cortical atrophy. An element of normal pressure hydrocephalus cannot be excluded.  Mr Brain Wo Contrast 05/08/2014 Moderate to advanced atrophy. Ventricular enlargement felt to be due to atrophy. Moderate to advanced chronic microvascular ischemia. No acute infarct or mass.  Medical Consultants:   Neurology  Other Consultants:   Physical therapy  Anti-Infectives:   None  Discharge Exam: Filed Vitals:   05/10/14 0936  BP: 170/82  Pulse: 82  Temp:   Resp:    Filed Vitals:   05/09/14 2109 05/10/14 0521 05/10/14 0744 05/10/14 0936  BP: 143/67 149/68 150/74 170/82  Pulse: 80 66 76 82  Temp: 98.3 F (36.8 C) 98 F (36.7 C)    TempSrc: Oral Oral    Resp: 18 18    Height:      Weight:      SpO2: 99% 95%      General: Pt is alert, follows commands appropriately, not in acute distress Cardiovascular: Regular rate and rhythm, S1/S2 +, no murmurs, no rubs, no gallops Respiratory: Clear to auscultation bilaterally, no wheezing, no crackles, no rhonchi Abdominal: Soft, non tender, non distended, bowel sounds +, no guarding Extremities: no edema, no  cyanosis, pulses palpable bilaterally DP and PT Neuro: Grossly nonfocal  Discharge Instructions  Discharge Instructions   Diet - low sodium heart healthy    Complete by:  As directed      Increase activity slowly    Complete by:  As directed             Medication List         albuterol 108 (90 BASE) MCG/ACT inhaler  Commonly known as:  PROVENTIL HFA;VENTOLIN HFA  Inhale 2  puffs into the lungs every 6 (six) hours as needed for wheezing or shortness of breath.     ALPRAZolam 0.25 MG tablet  Commonly known as:  XANAX  Take 1 tablet (0.25 mg total) by mouth at bedtime as needed for anxiety.     amLODipine 2.5 MG tablet  Commonly known as:  NORVASC  Take 2.5 mg by mouth daily with breakfast.     aspirin EC 81 MG tablet  Take 81 mg by mouth daily with breakfast.     atorvastatin 10 MG tablet  Commonly known as:  LIPITOR  Take 10 mg by mouth daily with breakfast.     CALCIUM MAGNESIUM PO  Take 1 tablet by mouth daily.     cycloSPORINE 0.05 % ophthalmic emulsion  Commonly known as:  RESTASIS  Place 1 drop into both eyes at bedtime.     donepezil 10 MG tablet  Commonly known as:  ARICEPT  Take 10 mg by mouth daily with breakfast.     fluticasone 50 MCG/ACT nasal spray  Commonly known as:  FLONASE  Place 1 spray into both nostrils daily.     hydrochlorothiazide 25 MG tablet  Commonly known as:  HYDRODIURIL  Take 25 mg by mouth daily with breakfast.     Influenza Vac Split Quad 0.25 ML injection  Commonly known as:  FLUZONE  Inject 0.25 mLs into the muscle once.     labetalol 300 MG tablet  Commonly known as:  NORMODYNE  Take 300 mg by mouth 2 (two) times daily.     lacosamide 50 MG Tabs tablet  Commonly known as:  VIMPAT  Take 1 tablet (50 mg total) by mouth 2 (two) times daily.     levETIRAcetam 1000 MG tablet  Commonly known as:  KEPPRA  Take 1 tablet (1,000 mg total) by mouth 2 (two) times daily.     lisinopril 40 MG tablet  Commonly known as:  PRINIVIL,ZESTRIL  take 1 tablet by mouth once daily     loratadine 10 MG tablet  Commonly known as:  CLARITIN  Take 10 mg by mouth daily.     Memantine HCl ER 7 & 14 & 21 &28 MG Cp24  Take as instructed.     Memantine HCl ER 28 MG Cp24  Take 28 mg by mouth daily.     multivitamin with minerals Tabs tablet  Take 1 tablet by mouth daily.     OMEGA 3 PO  Take 1 capsule by mouth daily.      SYSTANE OP  Place 2-4 drops into both eyes 4 (four) times daily as needed (for dry eyes).           Follow-up Information   Call Oliver Barre, MD.   Specialties:  Internal Medicine, Radiology   Contact information:   [address] [facility] [place] [address] [phone]       Schedule an appointment as soon as possible for a visit with Levert Feinstein, MD.  Specialty:  Neurology   Contact information:   [address] [place] [address] [phone]       Follow up with Debbora PrestoMAGICK-Lilia Letterman, MD. (As needed, If symptoms worsen)    Specialty:  Internal Medicine   Contact information:   [address] [place] [address] [phone]       Schedule an appointment as soon as possible for a visit with Edmon CrapeANKIN,GARY A, MD.   Specialty:  Ophthalmology   Contact information:   [address] [place] [address] [phone]        The results of significant diagnostics from this hospitalization (including imaging, microbiology, ancillary and laboratory) are listed below for reference.     Microbiology: No results found for this or any previous visit (from the past 240 hour(s)).   Labs: Basic Metabolic Panel:  Recent Labs Lab 05/08/14 0913 05/09/14 0351 05/10/14 0446  NA 136* 140 143  K 3.7 3.9 3.8  CL 101 103 106  CO2 25 26 27   GLUCOSE 119* 103* 95  BUN 20 17 18   CREATININE 1.27* 1.12* 1.07  CALCIUM 9.7 9.3 9.3   Liver Function Tests:  Recent Labs Lab 05/09/14 0351  AST 23  ALT 16  ALKPHOS 70  BILITOT 0.7  PROT 6.9  ALBUMIN 3.1*   CBC:  Recent Labs Lab 05/08/14 0913 05/09/14 0351 05/10/14 0446  WBC 5.0 5.3 4.6  HGB 12.0 12.0 11.4*  HCT 37.0 36.3 35.0*  MCV 96.1 95.0 94.9  PLT 189 206 199   CBG:  Recent Labs Lab 05/08/14 0906 05/09/14 0730 05/10/14 0741  GLUCAP 120* 101* 148*   SIGNED: Time coordinating discharge: Over 30 minutes  Debbora PrestoMAGICK-Shakeita Vandevander, MD  [facility] 05/10/2014, 10:00  AM Pager [phone]  If 7PM-7AM, please contact night-coverage [email or web address] Password [email or web address]

## 2014-05-13 ENCOUNTER — Telehealth: Payer: Self-pay | Admitting: Internal Medicine

## 2014-05-13 DIAGNOSIS — H538 Other visual disturbances: Secondary | ICD-10-CM

## 2014-05-13 NOTE — Telephone Encounter (Signed)
Done per emr 

## 2014-05-13 NOTE — Telephone Encounter (Signed)
Pt needs a referral to eye specialist, Dr Luciana Axeankin. Pt has appt 11/3 @ 1pm.

## 2014-05-14 ENCOUNTER — Telehealth: Payer: Self-pay | Admitting: Neurology

## 2014-05-14 ENCOUNTER — Encounter: Payer: Self-pay | Admitting: Neurology

## 2014-05-14 ENCOUNTER — Ambulatory Visit (INDEPENDENT_AMBULATORY_CARE_PROVIDER_SITE_OTHER): Payer: Medicare PPO | Admitting: Neurology

## 2014-05-14 VITALS — BP 178/93 | HR 71 | Ht 63.0 in | Wt 162.0 lb

## 2014-05-14 DIAGNOSIS — R569 Unspecified convulsions: Secondary | ICD-10-CM

## 2014-05-14 DIAGNOSIS — R413 Other amnesia: Secondary | ICD-10-CM

## 2014-05-14 DIAGNOSIS — I10 Essential (primary) hypertension: Secondary | ICD-10-CM

## 2014-05-14 MED ORDER — LACOSAMIDE 50 MG PO TABS: 50.0000 mg | ORAL_TABLET | Freq: Two times a day (BID) | ORAL | Status: DC

## 2014-05-14 NOTE — Telephone Encounter (Signed)
Left message

## 2014-05-14 NOTE — Progress Notes (Signed)
[facility]  PATIENT: Teresa Riley DOB: August 25, 1934   REASON FOR VISIT: follow up seizure disorder and memory disorder   HISTORY OF PRESENT ILLNESS: [name], 79 year old female returns for followup. She is now living with her daughter Teresa Riley.  She was referred by her primary care physician Dr. 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  She began to have seizure since 2013, she felt overheated, then become confused, blank stares, then slump down, body jerking movement.  She was put on Keppra, last seizure was in August 2014, she is now taking Keppra 1000 mg twice a day, tolerating it well, also taking Aricept 10 mg daily,  She now attends adult day care program, no longer driving, no gait difficulty,  MRI scan of the brain in 2014, shows extensive changes of chroniic microvascular ischemia and moderate degree of generalized cerebral atrophy and ventricular enlargement which appear slightly more advanced compared with previous MRI dated 06/03/2006.  EEG in the past showed left-sided slowing  UPDATE Oct 22nd 2015: She had one seizure 7:30 AM this morning, witnessed by her daughter, she was ready to go out of the house, collaped on the floor, dazed into space, no incontinence, lasting for few minutes, EMS was called, blood pressure was stable 130 over 80  She has been taking Keppra 1000 mg twice a day since 2014, compliant with her medications, on titrating dose of  Namenda,  She is now back to her baseline  UPDATE Nov 3rd 2015; She had another seizure in Oct 28th 2015, lasting 25 minutes. She is sitting on bar stool, then slumped over, unconscious.  She was taking Keppra 1000/1500mg , EEG was normal. We have reviewed MRI of the brain, in comparison to January 2014, no significant change, continued evidence of severe generalized atrophy, ventriculomegaly, moderate to severe white matter disease, she is now on vimpat 50mg  bid, and Keppra 1000mg  bid since Oct 28th 2015, she complains of foggy sensation, wekanes s all over   REVIEW OF SYSTEMS: Full 14 system review of systems performed and notable only for those listed, all others are neg:  As above  ALLERGIES: No Known Allergies  HOME MEDICATIONS: Outpatient Prescriptions Prior to Visit  Medication Sig Dispense Refill  . albuterol (PROVENTIL HFA;VENTOLIN HFA) 108 (90 BASE) MCG/ACT inhaler Inhale 2 puffs into the lungs every 6 (six) hours as needed for wheezing or shortness of breath. 1 Inhaler 5  . ALPRAZolam (XANAX) 0.25 MG tablet Take 1 tablet (0.25 mg total) by mouth at bedtime as needed for anxiety. 30 tablet 5  . amLODipine (NORVASC) 2.5 MG tablet Take 2.5 mg by mouth daily with breakfast.    . aspirin EC 81 MG tablet Take 81 mg by mouth daily with breakfast.    . atorvastatin (LIPITOR) 10 MG tablet Take 10 mg by mouth daily with breakfast.    . Calcium-Magnesium-Vitamin D (CALCIUM MAGNESIUM PO) Take 1 tablet by mouth daily.    . cycloSPORINE (RESTASIS) 0.05 % ophthalmic emulsion Place 1 drop into both  eyes at bedtime.    . donepezil (ARICEPT) 10 MG tablet Take 10 mg by mouth daily with breakfast.    . fluticasone (FLONASE) 50 MCG/ACT nasal spray Place 1 spray into both nostrils daily. 16 g 2  . hydrochlorothiazide (HYDRODIURIL) 25 MG tablet Take 25 mg by mouth daily with breakfast.    . Influenza Vac Split Quad (FLUZONE) 0.25 ML injection Inject 0.25 mLs into the muscle once.    . labetalol  (NORMODYNE) 300 MG tablet Take 300 mg by mouth 2 (two) times daily.     Marland Kitchen lacosamide (VIMPAT) 50 MG TABS tablet Take 1 tablet (50 mg total) by mouth 2 (two) times daily. 60 tablet 1  . levETIRAcetam (KEPPRA) 1000 MG tablet Take 1 tablet (1,000 mg total) by mouth 2 (two) times daily. 60 tablet 1  . lisinopril (PRINIVIL,ZESTRIL) 40 MG tablet take 1 tablet by mouth once daily 90 tablet 3  . loratadine (CLARITIN) 10 MG tablet Take 10 mg by mouth daily.    . Memantine HCl ER 28 MG CP24 Take 28 mg by mouth daily. 30 capsule 11  . Memantine HCl ER 7 & 14 & 21 &28 MG CP24 Take as instructed. 28 capsule 0  . Multiple Vitamin (MULTIVITAMIN WITH MINERALS) TABS tablet Take 1 tablet by mouth daily.    . Omega-3 Fatty Acids (OMEGA 3 PO) Take 1 capsule by mouth daily.    Bertram Gala Glycol-Propyl Glycol (SYSTANE OP) Place 2-4 drops into both eyes 4 (four) times daily as needed (for dry eyes).      No facility-administered medications prior to visit.    PAST MEDICAL HISTORY: Past Medical History  Diagnosis Date  . Hypertension   . HTN (hypertension) 10/29/2010  . H/O: hysterectomy 10/29/2010  . Chronic constipation 10/29/2010  . Eczema 10/29/2010  . History of cervical cancer 10/29/2010  . Allergic rhinitis, cause unspecified 10/29/2010  . Hyperlipidemia   . Orthostatic hypotension 06/05/2012  . Impaired glucose tolerance 08/16/2012  . Memory loss   . Seizures     PAST SURGICAL HISTORY: Past Surgical History  Procedure Laterality Date  . Abdominal hysterectomy  1970  . Breast biopsy  1980's    benign  . Cataract extraction w/ intraocular lens  implant, bilateral  01/2011  . Eye surgery      FAMILY HISTORY: Family History  Problem Relation Age of Onset  . Hypertension Mother   . Hypertension Father   . High blood pressure      SOCIAL HISTORY: History   Social History  . Marital Status: Divorced    Spouse Name: N/A    Number of Children: 3  . Years of Education: 2   Occupational  History  . retired    Social History Main Topics  . Smoking status: Former Smoker    Types: Cigarettes  . Smokeless tobacco: Never Used     Comment: quit in 1973  . Alcohol Use: No  . Drug Use: No  . Sexual Activity: No   Other Topics Concern  . Not on file   Social History Narrative   Patient lives at home with two daughters Teresa Riley) Teresa Riley). Patient is retired. Patient has two years college.     PHYSICAL EXAM  Filed Vitals:   05/14/14 1158  BP: 178/93  Pulse: 71  Height: 5\' 3"  (1.6 m)  Weight: 162 lb (73.483 kg)   Body mass index is 28.7 kg/(m^2).  Generalized: Well developed, in no acute distress , well groomed  Neurological examination   Mentation:Tired looking elderly female, MMSE 29/30, she has difficulty copy design  Cranial nerve II-XII: Pupils were equal round reactive to light extraocular movements were full, visual field were full on confrontational test. Facial sensation and strength were normal. hearing was intact to finger rubbing bilaterally. Uvula tongue midline. head turning and shoulder shrug were normal and symmetric.Tongue protrusion into cheek strength was normal. Motor: normal bulk and tone, full strength in the BUE, BLE, fine finger movements normal, no pronator drift. No focal weakness Coordination: finger-nose-finger, heel-to-shin bilaterally, no dysmetria Reflexes: Brachioradialis 2/2, biceps 2/2, triceps 2/2, patellar 2/2, Achilles 2/2, plantar responses were flexor bilaterally. Gait and Station: Rising up from seated position by pushing on a chair arm, cautious, mildly unsteady  DIAGNOSTIC DATA (LABS, IMAGING, TESTING) - I reviewed patient records, labs, notes, testing and imaging myself where available.  Lab Results  Component Value Date   WBC 4.6 05/10/2014   HGB 11.4* 05/10/2014   HCT 35.0* 05/10/2014   MCV 94.9 05/10/2014   PLT 199 05/10/2014      Component Value Date/Time   NA 143 05/10/2014 0446   K 3.8  05/10/2014 0446   CL 106 05/10/2014 0446   CO2 27 05/10/2014 0446   GLUCOSE 95 05/10/2014 0446   BUN 18 05/10/2014 0446   CREATININE 1.07 05/10/2014 0446   CALCIUM 9.3 05/10/2014 0446   PROT 6.9 05/09/2014 0351   ALBUMIN 3.1* 05/09/2014 0351   AST 23 05/09/2014 0351   ALT 16 05/09/2014 0351   ALKPHOS 70 05/09/2014 0351   BILITOT 0.7 05/09/2014 0351   GFRNONAA 49* 05/10/2014 0446   GFRAA 57* 05/10/2014 0446   Lab Results  Component Value Date   CHOL 261* 10/02/2013   HDL 92.50 10/02/2013   LDLCALC 155* 10/02/2013   LDLDIRECT 170.4 12/05/2012   TRIG 68.0 10/02/2013   CHOLHDL 3 10/02/2013    Lab Results  Component Value Date   VITAMINB12 924* 10/02/2013   Lab Results  Component Value Date   TSH 1.61 10/02/2013      ASSESSMENT AND PLAN  79 y.o. year old female  has a past medical history of hypertension, hyperlipidemia, complex partial seizure, mild memory trouble,    1, increase Keppra 1000 mg bid, add on Vimpat 50mg  bid 2. Aricept 10 mg daily 3.  Namenda xr 28mg  qday  4.  Home physical therapy. 5. RTC with Eber Jonesarolyn in March 2016.  Terrilyn SaverYijun Ziad Maye  [facility] [address], [address] [place] [address] [phone]

## 2014-05-14 NOTE — Telephone Encounter (Signed)
Dr. Terrace ArabiaYan this patient forgot to ask you a question during todays visit and wanted me to relay the message.  She said she feels like her scalp is hardening and drawing up.  She said this has been going on for quite a while.  Please call her at [phone]. .Marland Kitchen

## 2014-05-17 ENCOUNTER — Ambulatory Visit (INDEPENDENT_AMBULATORY_CARE_PROVIDER_SITE_OTHER): Payer: Medicare HMO | Admitting: Internal Medicine

## 2014-05-17 ENCOUNTER — Other Ambulatory Visit (INDEPENDENT_AMBULATORY_CARE_PROVIDER_SITE_OTHER): Payer: Medicare HMO

## 2014-05-17 ENCOUNTER — Encounter: Payer: Self-pay | Admitting: Internal Medicine

## 2014-05-17 ENCOUNTER — Telehealth: Payer: Self-pay | Admitting: Neurology

## 2014-05-17 VITALS — BP 150/98 | HR 68 | Temp 98.2°F | Ht 63.0 in | Wt 158.4 lb

## 2014-05-17 DIAGNOSIS — I10 Essential (primary) hypertension: Secondary | ICD-10-CM

## 2014-05-17 DIAGNOSIS — N179 Acute kidney failure, unspecified: Secondary | ICD-10-CM

## 2014-05-17 DIAGNOSIS — G40109 Localization-related (focal) (partial) symptomatic epilepsy and epileptic syndromes with simple partial seizures, not intractable, without status epilepticus: Secondary | ICD-10-CM

## 2014-05-17 DIAGNOSIS — R569 Unspecified convulsions: Secondary | ICD-10-CM

## 2014-05-17 DIAGNOSIS — R413 Other amnesia: Secondary | ICD-10-CM

## 2014-05-17 LAB — CBC WITH DIFFERENTIAL/PLATELET
BASOS ABS: 0 10*3/uL (ref 0.0–0.1)
Basophils Relative: 0.6 % (ref 0.0–3.0)
EOS ABS: 0.1 10*3/uL (ref 0.0–0.7)
Eosinophils Relative: 1.7 % (ref 0.0–5.0)
HEMATOCRIT: 38.8 % (ref 36.0–46.0)
HEMOGLOBIN: 12.8 g/dL (ref 12.0–15.0)
LYMPHS ABS: 2.5 10*3/uL (ref 0.7–4.0)
Lymphocytes Relative: 43.4 % (ref 12.0–46.0)
MCHC: 33 g/dL (ref 30.0–36.0)
MCV: 95.6 fl (ref 78.0–100.0)
Monocytes Absolute: 0.4 10*3/uL (ref 0.1–1.0)
Monocytes Relative: 7.7 % (ref 3.0–12.0)
NEUTROS ABS: 2.7 10*3/uL (ref 1.4–7.7)
Neutrophils Relative %: 46.6 % (ref 43.0–77.0)
Platelets: 247 10*3/uL (ref 150.0–400.0)
RBC: 4.06 Mil/uL (ref 3.87–5.11)
RDW: 12.6 % (ref 11.5–15.5)
WBC: 5.8 10*3/uL (ref 4.0–10.5)

## 2014-05-17 LAB — BASIC METABOLIC PANEL
BUN: 22 mg/dL (ref 6–23)
CHLORIDE: 101 meq/L (ref 96–112)
CO2: 30 meq/L (ref 19–32)
Calcium: 10 mg/dL (ref 8.4–10.5)
Creatinine, Ser: 1.1 mg/dL (ref 0.4–1.2)
GFR: 65.29 mL/min (ref >60.00)
GLUCOSE: 98 mg/dL (ref 70–99)
POTASSIUM: 3.4 meq/L — AB (ref 3.5–5.1)
SODIUM: 139 meq/L (ref 135–145)

## 2014-05-17 MED ORDER — LEVETIRACETAM 1000 MG PO TABS: ORAL_TABLET | ORAL | Status: DC

## 2014-05-17 MED ORDER — AMLODIPINE BESYLATE 5 MG PO TABS: 5.0000 mg | ORAL_TABLET | Freq: Every day | ORAL | Status: DC

## 2014-05-17 NOTE — Progress Notes (Signed)
Pre visit review using our clinic review tool, if applicable. No additional management support is needed unless otherwise documented below in the visit note. 

## 2014-05-17 NOTE — Progress Notes (Signed)
Subjective:    Patient ID: Teresa Riley, female    DOB: 1935-02-04, 79 y.o.   MRN: [id number]  HPI  Here with daughter for fu recent hospn,. D/c oct 30 after siezure; none further since d/c but just today vimpat ok for change to keppra 1500 bid per neurology.Also noted anemia and mild worsening renal insuff, advised for f/u today.  No overt blood loss. Pt denies chest pain, increased sob or doe, wheezing, orthopnea, PND, increased LE swelling, palpitations, dizziness or syncope.  Pt denies polydipsia, polyuria.  Denies worsening reflux, abd pain, dysphagia, n/v, bowel change or blood.  [facility] and down from 130-150. Past Medical History  Diagnosis Date  . Hypertension   . HTN (hypertension) 10/29/2010  . H/O: hysterectomy 10/29/2010  . Chronic constipation 10/29/2010  . Eczema 10/29/2010  . History of cervical cancer 10/29/2010  . Allergic rhinitis, cause unspecified 10/29/2010  . Hyperlipidemia   . Orthostatic hypotension 06/05/2012  . Impaired glucose tolerance 08/16/2012  . Memory loss   . Seizures    Past Surgical History  Procedure Laterality Date  . Abdominal hysterectomy  1970  . Breast biopsy  1980's    benign  . Cataract extraction w/ intraocular lens  implant, bilateral  01/2011  . Eye surgery      reports that she has quit smoking. Her smoking use included Cigarettes. She smoked 0.00 packs per day. She has never used smokeless tobacco. She reports that she does not drink alcohol or use illicit drugs. family history includes High blood pressure in an other family member; Hypertension in her father and mother. No Known Allergies Current Outpatient Prescriptions on File Prior to Visit  Medication Sig Dispense Refill  . albuterol (PROVENTIL HFA;VENTOLIN HFA) 108 (90 BASE) MCG/ACT inhaler Inhale 2 puffs into the lungs every 6 (six) hours as needed for wheezing or shortness of breath. 1 Inhaler 5  . ALPRAZolam (XANAX) 0.25 MG tablet Take 1 tablet (0.25 mg total)  by mouth at bedtime as needed for anxiety. 30 tablet 5  . aspirin EC 81 MG tablet Take 81 mg by mouth daily with breakfast.    . atorvastatin (LIPITOR) 10 MG tablet Take 10 mg by mouth daily with breakfast.    . Calcium-Magnesium-Vitamin D (CALCIUM MAGNESIUM PO) Take 1 tablet by mouth daily.    . cycloSPORINE (RESTASIS) 0.05 % ophthalmic emulsion Place 1 drop into both eyes at bedtime.    . donepezil (ARICEPT) 10 MG tablet Take 10 mg by mouth daily with breakfast.    . fluocinonide cream (LIDEX) 0.05 % as needed.  0  . fluticasone (FLONASE) 50 MCG/ACT nasal spray Place 1 spray into both nostrils daily. 16 g 2  . FLUVIRIN SUSP   0  . hydrochlorothiazide (HYDRODIURIL) 25 MG tablet Take 25 mg by mouth daily with breakfast.    . Influenza Vac Split Quad (FLUZONE) 0.25 ML injection Inject 0.25 mLs into the muscle once.    . labetalol (NORMODYNE) 300 MG tablet Take 300 mg by mouth 2 (two) times daily.     Marland Kitchen. lacosamide (VIMPAT) 50 MG TABS tablet Take 1 tablet (50 mg total) by mouth 2 (two) times daily. 60 tablet 11  . lisinopril (PRINIVIL,ZESTRIL) 40 MG tablet take 1 tablet by mouth once daily 90 tablet 3  . loratadine (CLARITIN) 10 MG tablet Take 10 mg by mouth daily.    . Memantine HCl ER 28 MG CP24 Take 28 mg by mouth daily. 30  capsule 11  . Memantine HCl ER 7 & 14 & 21 &28 MG CP24 Take as instructed. 28 capsule 0  . Multiple Vitamin (MULTIVITAMIN WITH MINERALS) TABS tablet Take 1 tablet by mouth daily.    Marland Kitchen. NAMENDA XR 7 MG CP24   0  . Omega-3 Fatty Acids (OMEGA 3 PO) Take 1 capsule by mouth daily.    Bertram Gala. Polyethyl Glycol-Propyl Glycol (SYSTANE OP) Place 2-4 drops into both eyes 4 (four) times daily as needed (for dry eyes).     Marland Kitchen. Spacer/Aero-Holding Chambers (AEROCHAMBER PLUS FLO-VU LARGE) MISC   0   No current facility-administered medications on file prior to visit.    Review of Systems  Constitutional: Negative for unusual diaphoresis or other sweats  HENT: Negative for ringing in  ear Eyes: Negative for double vision or worsening visual disturbance.  Respiratory: Negative for choking and stridor.   Gastrointestinal: Negative for vomiting or other signifcant bowel change Genitourinary: Negative for hematuria or decreased urine volume.  Musculoskeletal: Negative for other MSK pain or swelling Skin: Negative for color change and worsening wound.  Neurological: Negative for tremors and numbness other than noted  Psychiatric/Behavioral: Negative for decreased concentration or agitation other than above       Objective:   Physical Exam BP 150/98 mmHg  Pulse 68  Temp(Src) 98.2 F (36.8 C) (Oral)  Ht 5\' 3"  (1.6 m)  Wt 158 lb 6 oz (71.838 kg)  BMI 28.06 kg/m2  SpO2 95% VS noted,  Constitutional: Pt appears mild fatigued.  HENT: Head: NCAT.  Right Ear: External ear normal.  Left Ear: External ear normal.  Eyes: . Pupils are equal, round, and reactive to light. Conjunctivae and EOM are normal Neck: Normal range of motion. Neck supple.  Cardiovascular: Normal rate and regular rhythm.   Pulmonary/Chest: Effort normal and breath sounds normal.  Abd:  Soft, NT, ND, + BS Neurological: Pt is alert. + confused , motor grossly intact Skin: Skin is warm. No rash Psychiatric: Pt behavior is normal. No agitation.     Assessment & Plan:

## 2014-05-17 NOTE — Telephone Encounter (Signed)
I have talked with her daughter, patient complains of nightmare while taking Vimpat 50 mg twice a day, I have advised her daughter to increase her Keppra from 1000 milligrams 1 tablet/1 and half tablets to one and half tablets twice a day, new prescription was called in

## 2014-05-17 NOTE — Patient Instructions (Signed)
OK to increase the amlodipine to 5 mg per day  OK for tylenol occasionally for pain  You will be contacted regarding the referral for: Home Health  Please continue all other medications as before, and refills have been done if requested.  Please have the pharmacy call with any other refills you may need.  Please continue your efforts at being more active, low cholesterol diet, and weight control.  You are otherwise up to date with prevention measures today.  Please keep your appointments with your specialists as you may have planned  Please go to the LAB in the Basement (turn left off the elevator) for the tests to be done today  You will be contacted by phone if any changes need to be made immediately.  Otherwise, you will receive a letter about your results with an explanation, but please check with MyChart first.  Please remember to sign up for MyChart if you have not done so, as this will be important to you in the future with finding out test results, communicating by private email, and scheduling acute appointments online when needed.  Please return in 6 months, or sooner if needed

## 2014-05-17 NOTE — Telephone Encounter (Signed)
Patient's daughter [name] calling to state that patient wants to discuss medication change, states that the Vimpat is causing her to have nightmares and disturbing dreams, please return call and advise.

## 2014-05-18 NOTE — Assessment & Plan Note (Signed)
Also for f/u BMET today, encourage to cont take po

## 2014-05-18 NOTE — Assessment & Plan Note (Deleted)
stable overall by history and exam, recent data reviewed with pt, and pt to continue medical treatment as per neurology today,  to f/u any worsening symptoms or concerns,

## 2014-05-18 NOTE — Assessment & Plan Note (Addendum)
stable overall by history and exam, recent data reviewed with pt, and pt to continue medical treatment as before,  to f/u any worsening symptoms or concerns, [facility]

## 2014-05-18 NOTE — Assessment & Plan Note (Signed)
Mild uncontrolled, for increased amlodipine 5 qd,  to f/u any worsening symptoms or concerns BP Readings from Last 3 Encounters:  05/17/14 150/98  05/14/14 178/93  05/10/14 133/54

## 2014-05-18 NOTE — Assessment & Plan Note (Signed)
stable overall by history and exam, recent data reviewed with pt, and pt to continue medical treatment as per neurology today,  to f/u any worsening symptoms or concerns,  

## 2014-05-26 DIAGNOSIS — I10 Essential (primary) hypertension: Secondary | ICD-10-CM

## 2014-05-26 DIAGNOSIS — R413 Other amnesia: Secondary | ICD-10-CM

## 2014-05-26 DIAGNOSIS — G40909 Epilepsy, unspecified, not intractable, without status epilepticus: Secondary | ICD-10-CM

## 2014-05-26 DIAGNOSIS — I951 Orthostatic hypotension: Secondary | ICD-10-CM

## 2014-05-30 ENCOUNTER — Telehealth: Payer: Self-pay | Admitting: Internal Medicine

## 2014-05-30 NOTE — Telephone Encounter (Signed)
Ok for verbal 

## 2014-05-30 NOTE — Telephone Encounter (Signed)
HHRN informed had to leave a detailed message.

## 2014-05-30 NOTE — Telephone Encounter (Signed)
Rebeca AllegraLacasha - [facility] requesting verbal order for medical social worker to come out.  [phone]

## 2014-06-03 ENCOUNTER — Telehealth: Payer: Self-pay | Admitting: Neurology

## 2014-06-03 NOTE — Telephone Encounter (Signed)
Pt is having trouble sleeping and is having anxiety attacks.  Her daughter is calling to state that she takes Xanax only at night, but she still can't sleep.  Should she only be taking the Xanax at night or should she change how she is taking it or should Dr. Terrace ArabiaYan prescribe her something else to take?  Please call and advise.

## 2014-06-03 NOTE — Telephone Encounter (Signed)
I called back.  Patient was prescribed Xanax at ED on 10/30.  Caregiver says patient is not able to sleep, and due this, she has anxiety, thinking she may have seizures because of lack of sleep.  They do not wish to have PCP address anxiety or sleep issues, they prefer a recommendation from Dr Terrace ArabiaYan.  Please advise.  Thank you.

## 2014-06-04 ENCOUNTER — Telehealth: Payer: Self-pay | Admitting: Internal Medicine

## 2014-06-04 MED ORDER — SERTRALINE HCL 50 MG PO TABS: 50.0000 mg | ORAL_TABLET | Freq: Every day | ORAL | Status: DC

## 2014-06-04 NOTE — Telephone Encounter (Signed)
I have talked with patient daughter, patient has racing thoughts, anxiety, difficulty sleeping,  I have added on Zoloft 50 mg every morning, Xanax 0.2 5 mg every night as needed

## 2014-06-04 NOTE — Telephone Encounter (Signed)
[place] called in from [facility] and had question about pt leg crapping and needs to know what Dr [name] suggest?   Norva PavlovLacasha [phone]

## 2014-06-04 NOTE — Telephone Encounter (Signed)
If not too freqeuent, we can simply watch for now, or consider a muscle relaxer if needed

## 2014-06-05 NOTE — Telephone Encounter (Signed)
Called HHRN left detailed msg. Of MD instructions.

## 2014-06-11 ENCOUNTER — Other Ambulatory Visit: Payer: Self-pay

## 2014-06-11 MED ORDER — DONEPEZIL HCL 10 MG PO TABS: 10.0000 mg | ORAL_TABLET | Freq: Every day | ORAL | Status: DC

## 2014-06-23 ENCOUNTER — Encounter (HOSPITAL_COMMUNITY): Payer: Self-pay | Admitting: Emergency Medicine

## 2014-06-23 ENCOUNTER — Emergency Department (HOSPITAL_COMMUNITY)
Admission: EM | Admit: 2014-06-23 | Discharge: 2014-06-23 | Disposition: A | Discharge status: Home / Self Care | Payer: Commercial Managed Care - HMO | Attending: Emergency Medicine | Admitting: Emergency Medicine

## 2014-06-23 DIAGNOSIS — Z90710 Acquired absence of both cervix and uterus: Secondary | ICD-10-CM | POA: Diagnosis not present

## 2014-06-23 DIAGNOSIS — Z79899 Other long term (current) drug therapy: Secondary | ICD-10-CM | POA: Insufficient documentation

## 2014-06-23 DIAGNOSIS — Z872 Personal history of diseases of the skin and subcutaneous tissue: Secondary | ICD-10-CM | POA: Diagnosis not present

## 2014-06-23 DIAGNOSIS — Z7951 Long term (current) use of inhaled steroids: Secondary | ICD-10-CM | POA: Insufficient documentation

## 2014-06-23 DIAGNOSIS — R569 Unspecified convulsions: Secondary | ICD-10-CM

## 2014-06-23 DIAGNOSIS — Z7982 Long term (current) use of aspirin: Secondary | ICD-10-CM | POA: Insufficient documentation

## 2014-06-23 DIAGNOSIS — Z8709 Personal history of other diseases of the respiratory system: Secondary | ICD-10-CM | POA: Insufficient documentation

## 2014-06-23 DIAGNOSIS — G40909 Epilepsy, unspecified, not intractable, without status epilepticus: Secondary | ICD-10-CM | POA: Insufficient documentation

## 2014-06-23 DIAGNOSIS — I10 Essential (primary) hypertension: Secondary | ICD-10-CM | POA: Insufficient documentation

## 2014-06-23 DIAGNOSIS — Z8541 Personal history of malignant neoplasm of cervix uteri: Secondary | ICD-10-CM | POA: Diagnosis not present

## 2014-06-23 DIAGNOSIS — Z8719 Personal history of other diseases of the digestive system: Secondary | ICD-10-CM | POA: Insufficient documentation

## 2014-06-23 DIAGNOSIS — E785 Hyperlipidemia, unspecified: Secondary | ICD-10-CM | POA: Diagnosis not present

## 2014-06-23 DIAGNOSIS — Z87891 Personal history of nicotine dependence: Secondary | ICD-10-CM | POA: Diagnosis not present

## 2014-06-23 LAB — CBC WITH DIFFERENTIAL/PLATELET
BASOS ABS: 0 10*3/uL (ref 0.0–0.1)
BASOS PCT: 0 % (ref 0–1)
Eosinophils Absolute: 0.1 10*3/uL (ref 0.0–0.7)
Eosinophils Relative: 2 % (ref 0–5)
HEMATOCRIT: 36.1 % (ref 36.0–46.0)
Hemoglobin: 11.8 g/dL — ABNORMAL LOW (ref 12.0–15.0)
Lymphocytes Relative: 31 % (ref 12–46)
Lymphs Abs: 1.6 10*3/uL (ref 0.7–4.0)
MCH: 31 pg (ref 26.0–34.0)
MCHC: 32.7 g/dL (ref 30.0–36.0)
MCV: 94.8 fL (ref 78.0–100.0)
MONO ABS: 0.3 10*3/uL (ref 0.1–1.0)
Monocytes Relative: 6 % (ref 3–12)
NEUTROS ABS: 3 10*3/uL (ref 1.7–7.7)
NEUTROS PCT: 61 % (ref 43–77)
Platelets: 215 10*3/uL (ref 150–400)
RBC: 3.81 MIL/uL — ABNORMAL LOW (ref 3.87–5.11)
RDW: 11.9 % (ref 11.5–15.5)
WBC: 5 10*3/uL (ref 4.0–10.5)

## 2014-06-23 LAB — COMPREHENSIVE METABOLIC PANEL
ALBUMIN: 3.4 g/dL — AB (ref 3.5–5.2)
ALT: 15 U/L (ref 0–35)
ANION GAP: 14 (ref 5–15)
AST: 27 U/L (ref 0–37)
Alkaline Phosphatase: 73 U/L (ref 39–117)
BUN: 17 mg/dL (ref 6–23)
CHLORIDE: 101 meq/L (ref 96–112)
CO2: 25 mEq/L (ref 19–32)
CREATININE: 1.18 mg/dL — AB (ref 0.50–1.10)
Calcium: 9.7 mg/dL (ref 8.4–10.5)
GFR calc Af Amer: 50 mL/min — ABNORMAL LOW (ref >90)
GFR calc non Af Amer: 43 mL/min — ABNORMAL LOW (ref >90)
Glucose, Bld: 130 mg/dL — ABNORMAL HIGH (ref 70–99)
Potassium: 3.2 mEq/L — ABNORMAL LOW (ref 3.7–5.3)
Sodium: 140 mEq/L (ref 137–147)
TOTAL PROTEIN: 7.4 g/dL (ref 6.0–8.3)
Total Bilirubin: 0.4 mg/dL (ref 0.3–1.2)

## 2014-06-23 LAB — URINALYSIS, ROUTINE W REFLEX MICROSCOPIC
Bilirubin Urine: NEGATIVE
GLUCOSE, UA: NEGATIVE mg/dL
Ketones, ur: NEGATIVE mg/dL
LEUKOCYTES UA: NEGATIVE
NITRITE: NEGATIVE
PH: 6.5 (ref 5.0–8.0)
Protein, ur: NEGATIVE mg/dL
SPECIFIC GRAVITY, URINE: 1.019 (ref 1.005–1.030)
Urobilinogen, UA: 0.2 mg/dL (ref 0.0–1.0)

## 2014-06-23 LAB — URINE MICROSCOPIC-ADD ON

## 2014-06-23 MED ORDER — SODIUM CHLORIDE 0.9 % IV SOLN
INTRAVENOUS | Status: DC
  Administered 2014-06-23: 12:00:00 via INTRAVENOUS

## 2014-06-23 NOTE — ED Provider Notes (Signed)
CSN: [id number]     Arrival date & time 06/23/14  1124 History   First MD Initiated Contact with Patient 06/23/14 1131     Chief Complaint  Patient presents with  . Seizures    HPI Patient presents to the emergency room after having a seizure. The patient was going out for a walk with her daughter. Midway through their walk she decided to sit down and take a break bc she was feeling fatigued.  The daughter noticed that the patient started to become pale.  She then had an episode where she became unresponsive. She did not have any shaking. This episode lasted for maybe 2 minutes. Patient was not able to immediately respond according to the daughter. She was able to communicate with EMS found the patient does remember EMS arriving. She has a history of having episodes like this in the past. Previously that have been diagnosed as syncope but more recently she was told she had seizures. The patient is taking Keppra. The patient denies any trouble right now. She does not have any headache. She denies any injuries. She denies any numbness or weakness. She denies any chest pain or shortness of breath. Past Medical History  Diagnosis Date  . Hypertension   . HTN (hypertension) 10/29/2010  . H/O: hysterectomy 10/29/2010  . Chronic constipation 10/29/2010  . Eczema 10/29/2010  . History of cervical cancer 10/29/2010  . Allergic rhinitis, cause unspecified 10/29/2010  . Hyperlipidemia   . Orthostatic hypotension 06/05/2012  . Impaired glucose tolerance 08/16/2012  . Memory loss   . Seizures    Past Surgical History  Procedure Laterality Date  . Abdominal hysterectomy  1970  . Breast biopsy  1980's    benign  . Cataract extraction w/ intraocular lens  implant, bilateral  01/2011  . Eye surgery     Family History  Problem Relation Age of Onset  . Hypertension Mother   . Hypertension Father   . High blood pressure     History  Substance Use Topics  . Smoking status: Former Smoker    Types:  Cigarettes  . Smokeless tobacco: Never Used     Comment: quit in 1973  . Alcohol Use: No   OB History    No data available     Review of Systems  All other systems reviewed and are negative.     Allergies  Review of patient's allergies indicates no known allergies.  Home Medications   Prior to Admission medications   Medication Sig Start Date End Date Taking? Authorizing Provider  albuterol (PROVENTIL HFA;VENTOLIN HFA) 108 (90 BASE) MCG/ACT inhaler Inhale 2 puffs into the lungs every 6 (six) hours as needed for wheezing or shortness of breath. 03/05/14  Yes Corwin LevinsJames W [name], MD  ALPRAZolam Prudy Feeler(XANAX) 0.25 MG tablet Take 1 tablet (0.25 mg total) by mouth at bedtime as needed for anxiety. 05/10/14  Yes Dorothea OgleIskra M Myers, MD  amLODipine (NORVASC) 5 MG tablet Take 1 tablet (5 mg total) by mouth daily. 05/17/14 05/17/15 Yes Corwin LevinsJames W [name], MD  aspirin EC 81 MG tablet Take 81 mg by mouth daily with breakfast.   Yes Historical Provider, MD  atorvastatin (LIPITOR) 10 MG tablet Take 10 mg by mouth daily with breakfast.   Yes Historical Provider, MD  Calcium-Magnesium-Vitamin D (CALCIUM MAGNESIUM PO) Take 1 tablet by mouth daily.   Yes Historical Provider, MD  cycloSPORINE (RESTASIS) 0.05 % ophthalmic emulsion Place 1 drop into both eyes at bedtime.   Yes Historical Provider,  MD  donepezil (ARICEPT) 10 MG tablet Take 1 tablet (10 mg total) by mouth daily. 06/11/14  Yes Levert FeinsteinYijun Yan, MD  fluticasone (FLONASE) 50 MCG/ACT nasal spray Place 1 spray into both nostrils daily. 03/05/14  Yes Corwin LevinsJames W [name], MD  hydrochlorothiazide (HYDRODIURIL) 25 MG tablet Take 25 mg by mouth daily with breakfast.   Yes Historical Provider, MD  labetalol (NORMODYNE) 300 MG tablet Take 300 mg by mouth 2 (two) times daily.    Yes Historical Provider, MD  lacosamide (VIMPAT) 50 MG TABS tablet Take 1 tablet (50 mg total) by mouth 2 (two) times daily. 05/14/14  Yes Levert FeinsteinYijun Yan, MD  levETIRAcetam (KEPPRA) 1000 MG tablet 1.5 tabs po bid Patient  taking differently: Take 1,500 mg by mouth 2 (two) times daily. 1.5 tabs po bid 05/17/14  Yes Levert FeinsteinYijun Yan, MD  lisinopril (PRINIVIL,ZESTRIL) 40 MG tablet take 1 tablet by mouth once daily 02/05/14  Yes Corwin LevinsJames W [name], MD  loratadine (CLARITIN) 10 MG tablet Take 10 mg by mouth daily.   Yes Historical Provider, MD  Memantine HCl ER 28 MG CP24 Take 28 mg by mouth daily. 04/05/14  Yes Levert FeinsteinYijun Yan, MD  Multiple Vitamin (MULTIVITAMIN WITH MINERALS) TABS tablet Take 1 tablet by mouth daily.   Yes Historical Provider, MD  Omega-3 Fatty Acids (OMEGA 3 PO) Take 1 capsule by mouth daily.   Yes Historical Provider, MD  Polyethyl Glycol-Propyl Glycol (SYSTANE OP) Place 2-4 drops into both eyes 4 (four) times daily as needed (for dry eyes).    Yes Historical Provider, MD  sertraline (ZOLOFT) 50 MG tablet Take 1 tablet (50 mg total) by mouth daily. 06/04/14  Yes Levert FeinsteinYijun Yan, MD   BP 177/78 mmHg  Pulse 61  Temp(Src) 97.9 F (36.6 C)  Resp 15  Ht 5\' 3"  (1.6 m)  Wt 150 lb (68.04 kg)  BMI 26.58 kg/m2  SpO2 95% Physical Exam  Constitutional: She is oriented to person, place, and time. She appears well-developed and well-nourished. No distress.  HENT:  Head: Normocephalic and atraumatic.  Right Ear: External ear normal.  Left Ear: External ear normal.  Eyes: Conjunctivae are normal. Right eye exhibits no discharge. Left eye exhibits no discharge. No scleral icterus.  Neck: Neck supple. No tracheal deviation present.  Cardiovascular: Normal rate, regular rhythm and intact distal pulses.   Pulmonary/Chest: Effort normal and breath sounds normal. No stridor. No respiratory distress. She has no wheezes. She has no rales.  Abdominal: Soft. Bowel sounds are normal. She exhibits no distension. There is no tenderness. There is no rebound and no guarding.  Musculoskeletal: She exhibits no edema or tenderness.  Neurological: She is alert and oriented to person, place, and time. She has normal strength. No cranial nerve deficit  (no facial droop, extraocular movements intact, no slurred speech) or sensory deficit. She exhibits normal muscle tone. She displays no seizure activity. Coordination normal.  No pronator drift bilateral upper extrem, able to lift both legs off the bed equally, good strength  Skin: Skin is warm and dry. No rash noted.  Psychiatric: She has a normal mood and affect.  Nursing note and vitals reviewed.   ED Course  Procedures (including critical care time) Labs Review Labs Reviewed  CBC WITH DIFFERENTIAL - Abnormal; Notable for the following:    RBC 3.81 (*)    Hemoglobin 11.8 (*)    All other components within normal limits  COMPREHENSIVE METABOLIC PANEL - Abnormal; Notable for the following:    Potassium 3.2 (*)  Glucose, Bld 130 (*)    Creatinine, Ser 1.18 (*)    Albumin 3.4 (*)    GFR calc non Af Amer 43 (*)    GFR calc Af Amer 50 (*)    All other components within normal limits  URINALYSIS, ROUTINE W REFLEX MICROSCOPIC - Abnormal; Notable for the following:    Hgb urine dipstick TRACE (*)    All other components within normal limits  URINE MICROSCOPIC-ADD ON - Abnormal; Notable for the following:    Casts HYALINE CASTS (*)    All other components within normal limits    Imaging Review No results found.   EKG Interpretation   Date/Time:  Sunday June 23 2014 11:40:49 EST Ventricular Rate:  66 PR Interval:  231 QRS Duration: 92 QT Interval:  447 QTC Calculation: 468 R Axis:   13 Text Interpretation:  Sinus rhythm Borderline prolonged PR interval  Anteroseptal infarct, age indeterminate Lateral leads are also involved  Artifact No significant change since last tracing Confirmed by Marcey Persad   MD-J, Annabel Gibeau ([id number]) on 06/23/2014 12:24:05 PM      MDM   Final diagnoses:  Seizure    She was Monitored in the emergency department. She had no further episodes.  The patient has a history of orthostatic hypotension and seizures. Daughter states she's had these episodes for  many years. In the past they were called syncope. More recently she was told these episodes were related to seizures.  The episodes do not involve any shaking or tonic-clonic activity. She is on a maximal dose of Keppra right now. Recommend she follow up with her neurologist to see if there needs any adjustment of her medications.  At this time there does not appear to be any evidence of an acute emergency medical condition and the patient appears stable for discharge with appropriate outpatient follow up.   Linwood Dibbles, MD 06/23/14 [phone]

## 2014-06-23 NOTE — ED Notes (Signed)
Bed: [id number] Expected date: 06/23/14 Expected time: 11:18 AM Means of arrival: Ambulance Comments: seizure

## 2014-06-23 NOTE — ED Notes (Signed)
Per EMS- was out walking with daughter, had witnessed seizure lasting around 3 minutes. Sat down before seizure, did not hit head, no fall. Post ictal-able to answer questions appropriately for EMS on arrival. Had 2 seizures around a month ago. Had gone 2 years without seizures before this. On seizure medications currently (Keppra). Hx seizures. A&Ox4. VS: 12 Lead unremarkable-SR, BP 137/80 HR 66 SpO2 99% on RA. CBG 93 mg/dl.

## 2014-06-23 NOTE — ED Notes (Signed)
Attempted using the bathroom. Patient incontinent in the bed. Unable to produce urine sample at this time. Will re-evaluate promptly.

## 2014-06-23 NOTE — Discharge Instructions (Signed)

## 2014-06-24 ENCOUNTER — Telehealth: Payer: Self-pay | Admitting: Nurse Practitioner

## 2014-06-24 NOTE — Telephone Encounter (Signed)
Teresa PassyFreda, patients daughter, calling again.  She is very concerned.  Wonders if they need to bring her in to be seen?  They didn't make any adjustments to her medications in the ER and she is concerned she might have another seizure.  They did draw blood to check the Keppra level but told her it could take a few days for the results to come back.  Please call.

## 2014-06-24 NOTE — Telephone Encounter (Signed)
Pt's daughter [name] is calling stating pt had a seizure yesterday. Before she has these seizure she always complains of not getting enough sleep, she takes Xanax at night before bed and that is not working and the daughter seems to think she may need something else.  Please call and advise.

## 2014-06-25 NOTE — Telephone Encounter (Signed)
I called and left a detailed message and discussing with Dr. Terrace ArabiaYan. We need to add Vimpat 50mg  twice daily to her regime. She is at the max dose of Keppra.I would also recommend she stop the Aricept as it has been known to cause seizure. Please call our office and let us know if you will pick up samples or the medication can be called in.  Thank you

## 2014-06-25 NOTE — Telephone Encounter (Signed)
Teresa PassyFreda is calling again upset that noone has returned her call.  She is very concerned about her mother and does not want her to have another seizure.  Wants to know what she needs to do.  Please call and advise.

## 2014-06-25 NOTE — Addendum Note (Signed)
Addended by: Beverely LowMARTIN, NANCY on: 06/25/2014 04:52 PM   Modules accepted: Orders, Medications

## 2014-06-25 NOTE — Telephone Encounter (Signed)
I called and got the V/M.  I left a detailed message apologizing that the pt was not called back yesterday due to phone staff sending the message to staff member that was not in the office.  I also let them know that I was sending the message to MD and NP as an urgent message.

## 2014-06-25 NOTE — Telephone Encounter (Signed)
[name] called back requests samples left at front desk.

## 2014-07-03 ENCOUNTER — Telehealth: Payer: Self-pay | Admitting: Internal Medicine

## 2014-07-03 NOTE — Telephone Encounter (Signed)
Is trying to locate Home Health order for Occupational therapy.  Order number [id number].  Beginning date 05/21/2014.

## 2014-07-03 NOTE — Telephone Encounter (Signed)
Unfortunately, I have never seen documentation for this patient.

## 2014-07-03 NOTE — Telephone Encounter (Signed)
Left vm for lindsay to refax

## 2014-07-07 ENCOUNTER — Other Ambulatory Visit: Payer: Self-pay | Admitting: Internal Medicine

## 2014-08-12 ENCOUNTER — Telehealth: Payer: Self-pay | Admitting: Neurology

## 2014-08-12 NOTE — Telephone Encounter (Signed)
Refills were sent in Nov.  I called back.  Verified pharmacy info with patient, they still use the same pharmacy, [facility].  [facility].  She verified they do have Rx, and will proceed with order today.

## 2014-08-12 NOTE — Telephone Encounter (Signed)
Patient's daughter, [name] stated patient needs Rx refill for lacosamide (VIMPAT) 50 MG TABS tablet.  Please call and advise.

## 2014-08-13 ENCOUNTER — Other Ambulatory Visit: Payer: Self-pay

## 2014-08-13 MED ORDER — LACOSAMIDE 50 MG PO TABS
50.0000 mg | ORAL_TABLET | Freq: Two times a day (BID) | ORAL | Status: DC
Start: 2014-08-13 — End: 2014-10-03

## 2014-08-23 DIAGNOSIS — M810 Age-related osteoporosis without current pathological fracture: Secondary | ICD-10-CM | POA: Diagnosis not present

## 2014-08-28 ENCOUNTER — Telehealth: Payer: Self-pay | Admitting: Neurology

## 2014-08-28 MED ORDER — LEVETIRACETAM 1000 MG PO TABS: ORAL_TABLET | ORAL | Status: DC

## 2014-08-28 NOTE — Telephone Encounter (Signed)
Patient's daughter called and stated Rx levETIRAcetam (KEPPRA) 1000 MG tablet needs to read 1.5 tab in am and 1.5 tab in pm.  [facility] [address]  Please call and advise.

## 2014-08-28 NOTE — Telephone Encounter (Signed)
Rx was previously sent for this dose, however it has now been resent.  I called back.  Got no answer, left message.

## 2014-09-05 ENCOUNTER — Encounter: Payer: Self-pay | Admitting: Internal Medicine

## 2014-09-05 ENCOUNTER — Other Ambulatory Visit (INDEPENDENT_AMBULATORY_CARE_PROVIDER_SITE_OTHER): Payer: Medicare HMO

## 2014-09-05 ENCOUNTER — Ambulatory Visit (INDEPENDENT_AMBULATORY_CARE_PROVIDER_SITE_OTHER): Payer: Medicare HMO | Admitting: Internal Medicine

## 2014-09-05 VITALS — BP 160/92 | HR 81 | Temp 98.3°F | Resp 18 | Ht 63.0 in | Wt 157.1 lb

## 2014-09-05 DIAGNOSIS — K5900 Constipation, unspecified: Secondary | ICD-10-CM | POA: Diagnosis not present

## 2014-09-05 DIAGNOSIS — E559 Vitamin D deficiency, unspecified: Secondary | ICD-10-CM

## 2014-09-05 DIAGNOSIS — M810 Age-related osteoporosis without current pathological fracture: Secondary | ICD-10-CM

## 2014-09-05 DIAGNOSIS — F411 Generalized anxiety disorder: Secondary | ICD-10-CM

## 2014-09-05 DIAGNOSIS — J309 Allergic rhinitis, unspecified: Secondary | ICD-10-CM

## 2014-09-05 DIAGNOSIS — Z0000 Encounter for general adult medical examination without abnormal findings: Secondary | ICD-10-CM

## 2014-09-05 DIAGNOSIS — Z0001 Encounter for general adult medical examination with abnormal findings: Secondary | ICD-10-CM | POA: Insufficient documentation

## 2014-09-05 DIAGNOSIS — F418 Other specified anxiety disorders: Secondary | ICD-10-CM | POA: Insufficient documentation

## 2014-09-05 HISTORY — DX: Vitamin D deficiency, unspecified: E55.9

## 2014-09-05 HISTORY — DX: Allergic rhinitis, unspecified: J30.9

## 2014-09-05 LAB — CBC WITH DIFFERENTIAL/PLATELET
BASOS ABS: 0 10*3/uL (ref 0.0–0.1)
Basophils Relative: 0.6 % (ref 0.0–3.0)
EOS PCT: 2.3 % (ref 0.0–5.0)
Eosinophils Absolute: 0.1 10*3/uL (ref 0.0–0.7)
HCT: 33.3 % — ABNORMAL LOW (ref 36.0–46.0)
HEMOGLOBIN: 11.4 g/dL — AB (ref 12.0–15.0)
Lymphocytes Relative: 38.5 % (ref 12.0–46.0)
Lymphs Abs: 2 10*3/uL (ref 0.7–4.0)
MCHC: 34.3 g/dL (ref 30.0–36.0)
MCV: 92.1 fl (ref 78.0–100.0)
MONOS PCT: 10.8 % (ref 3.0–12.0)
Monocytes Absolute: 0.6 10*3/uL (ref 0.1–1.0)
NEUTROS ABS: 2.5 10*3/uL (ref 1.4–7.7)
Neutrophils Relative %: 47.8 % (ref 43.0–77.0)
Platelets: 258 10*3/uL (ref 150.0–400.0)
RBC: 3.62 Mil/uL — AB (ref 3.87–5.11)
RDW: 12.8 % (ref 11.5–15.5)
WBC: 5.2 10*3/uL (ref 4.0–10.5)

## 2014-09-05 MED ORDER — METHYLPREDNISOLONE ACETATE 80 MG/ML IJ SUSP
80.0000 mg | Freq: Once | INTRAMUSCULAR | Status: AC
  Administered 2014-09-05: 80 mg via INTRAMUSCULAR

## 2014-09-05 MED ORDER — POTASSIUM CHLORIDE ER 10 MEQ PO TBCR: 10.0000 meq | EXTENDED_RELEASE_TABLET | Freq: Every day | ORAL | Status: DC

## 2014-09-05 MED ORDER — ALPRAZOLAM 0.25 MG PO TABS: 0.2500 mg | ORAL_TABLET | Freq: Two times a day (BID) | ORAL | Status: DC | PRN

## 2014-09-05 MED ORDER — RISEDRONATE SODIUM 150 MG PO TABS: 150.0000 mg | ORAL_TABLET | ORAL | Status: DC

## 2014-09-05 NOTE — Assessment & Plan Note (Signed)

## 2014-09-05 NOTE — Assessment & Plan Note (Signed)
For flonase daily , claritin prn, depomedrol IM today,  to f/u any worsening symptoms or concerns

## 2014-09-05 NOTE — Assessment & Plan Note (Signed)
Also for f/u as she has been taking otc recently per GYN

## 2014-09-05 NOTE — Assessment & Plan Note (Signed)
For daily miralax to improve

## 2014-09-05 NOTE — Assessment & Plan Note (Signed)
Ok for Fisher Scientificactonel monthly , unless cost prohibitive, then consider fosamax

## 2014-09-05 NOTE — Progress Notes (Signed)
Subjective:    Patient ID: Teresa Riley, female    DOB: 12-14-1934, 80 y.o.   MRN: [id number]  HPI  Here for wellness and f/u;  Overall doing ok;  Pt denies CP, worsening SOB, DOE, wheezing, orthopnea, PND, worsening LE edema, palpitations, dizziness or syncope.  Pt denies neurological change such as new headache, facial or extremity weakness.  Pt denies polydipsia, polyuria, or low sugar symptoms. Pt states overall good compliance with treatment and medications, good tolerability, and has been trying to follow lower cholesterol diet.  Pt denies worsening depressive symptoms, suicidal ideation or panic. No fever, night sweats, wt loss, loss of appetite, or other constitutional symptoms.  Pt states good ability with ADL's, has low fall risk, home safety reviewed and adequate, no other significant changes in hearing or vision, and only occasionally active with exercise    Wt Readings from Last 3 Encounters:  09/05/14 157 lb 1.3 oz (71.251 kg)  06/23/14 150 lb (68.04 kg)  05/17/14 158 lb 6 oz (71.838 kg)  Denies worsening depressive symptoms, suicidal ideation, or panic; has ongoing anxiety, not increased recently, asks for bid xanax prn.  Does have several wks ongoing nasal allergy symptoms with clearish congestion, itch and sneezing, without fever, pain, ST, cough, swelling or wheezing, not using the flonase every day.  Denies worsening reflux, abd pain, dysphagia, n/v, bowel change or blood except for increased constipatoin, not using the miralax every dayt. Recent DXA per GYN c/w osteoporosis per family , asks for tx. Past Medical History  Diagnosis Date  . Hypertension   . HTN (hypertension) 10/29/2010  . H/O: hysterectomy 10/29/2010  . Chronic constipation 10/29/2010  . Eczema 10/29/2010  . History of cervical cancer 10/29/2010  . Allergic rhinitis, cause unspecified 10/29/2010  . Hyperlipidemia   . Orthostatic hypotension 06/05/2012  . Impaired glucose tolerance 08/16/2012  . Memory loss   .  Seizures   . Vitamin D deficiency 09/05/2014  . Allergic rhinitis 09/05/2014   Past Surgical History  Procedure Laterality Date  . Abdominal hysterectomy  1970  . Breast biopsy  1980's    benign  . Cataract extraction w/ intraocular lens  implant, bilateral  01/2011  . Eye surgery      reports that she has quit smoking. Her smoking use included Cigarettes. She has never used smokeless tobacco. She reports that she does not drink alcohol or use illicit drugs. family history includes High blood pressure in an other family member; Hypertension in her father and mother. No Known Allergies Current Outpatient Prescriptions on File Prior to Visit  Medication Sig Dispense Refill  . albuterol (PROVENTIL HFA;VENTOLIN HFA) 108 (90 BASE) MCG/ACT inhaler Inhale 2 puffs into the lungs every 6 (six) hours as needed for wheezing or shortness of breath. 1 Inhaler 5  . amLODipine (NORVASC) 5 MG tablet Take 1 tablet (5 mg total) by mouth daily. 90 tablet 3  . aspirin EC 81 MG tablet Take 81 mg by mouth daily with breakfast.    . atorvastatin (LIPITOR) 10 MG tablet Take 10 mg by mouth daily with breakfast.    . Calcium-Magnesium-Vitamin D (CALCIUM MAGNESIUM PO) Take 1 tablet by mouth daily.    . cycloSPORINE (RESTASIS) 0.05 % ophthalmic emulsion Place 1 drop into both eyes at bedtime.    . fluticasone (FLONASE) 50 MCG/ACT nasal spray Place 1 spray into both nostrils daily. 16 g 2  . hydrochlorothiazide (HYDRODIURIL) 25 MG tablet take 1 tablet by mouth once daily 90 tablet 3  .  labetalol (NORMODYNE) 300 MG tablet Take 300 mg by mouth 2 (two) times daily.     Marland Kitchen lacosamide (VIMPAT) 50 MG TABS tablet Take 1 tablet (50 mg total) by mouth 2 (two) times daily. 60 tablet 5  . levETIRAcetam (KEPPRA) 1000 MG tablet 1.5 tabs ( ) po twice daily (in am and pm) 90 tablet 6  . lisinopril (PRINIVIL,ZESTRIL) 40 MG tablet take 1 tablet by mouth once daily 90 tablet 3  . loratadine (CLARITIN) 10 MG tablet Take 10 mg by  mouth daily.    . Memantine HCl ER 28 MG CP24 Take 28 mg by mouth daily. 30 capsule 11  . Multiple Vitamin (MULTIVITAMIN WITH MINERALS) TABS tablet Take 1 tablet by mouth daily.    . Omega-3 Fatty Acids (OMEGA 3 PO) Take 1 capsule by mouth daily.    Bertram Gala Glycol-Propyl Glycol (SYSTANE OP) Place 2-4 drops into both eyes 4 (four) times daily as needed (for dry eyes).     . sertraline (ZOLOFT) 50 MG tablet Take 1 tablet (50 mg total) by mouth daily. 30 tablet 11   No current facility-administered medications on file prior to visit.     Review of Systems Constitutional: Negative for increased diaphoresis, other activity, appetite or other siginficant weight change  HENT: Negative for worsening hearing loss, ear pain, facial swelling, mouth sores and neck stiffness.   Eyes: Negative for other worsening pain, redness or visual disturbance.  Respiratory: Negative for shortness of breath and wheezing.   Cardiovascular: Negative for chest pain and palpitations.  Gastrointestinal: Negative for diarrhea, blood in stool, abdominal distention or other pain Genitourinary: Negative for hematuria, flank pain or change in urine volume.  Musculoskeletal: Negative for myalgias or other joint complaints.  Skin: Negative for color change and wound.  Neurological: Negative for syncope and numbness. other than noted Hematological: Negative for adenopathy. or other swelling Psychiatric/Behavioral: Negative for hallucinations, self-injury, decreased concentration or other worsening agitation.      Objective:   Physical Exam BP 160/92 mmHg  Pulse 81  Temp(Src) 98.3 F (36.8 C) (Oral)  Resp 18  Ht  (1.6 m)  Wt 157 lb 1.3 oz (71.251 kg)  BMI 27.83 kg/m2  SpO2 94% VS noted,  Constitutional: Pt is oriented to person, place, and time. Appears well-developed and well-nourished.  Head: Normocephalic and atraumatic.  Right Ear: External ear normal.  Left Ear: External ear normal.  Nose: Nose  normal.  Mouth/Throat: Oropharynx is clear and moist.  Eyes: Conjunctivae and EOM are normal. Pupils are equal, round, and reactive to light.  Neck: Normal range of motion. Neck supple. No JVD present. No tracheal deviation present.  Cardiovascular: Normal rate, regular rhythm, normal heart sounds and intact distal pulses.   Pulmonary/Chest: Effort normal and breath sounds without rales or wheezing  Abdominal: Soft. Bowel sounds are normal. NT. No HSM  Musculoskeletal: Normal range of motion. Exhibits no edema.  Lymphadenopathy:  Has no cervical adenopathy.  Neurological: Pt is alert and oriented to person, place, and time. Pt has normal reflexes. No cranial nerve deficit. Motor grossly intact + baseline confiused Skin: Skin is warm and dry. No rash noted.  Psychiatric:  Has normal mood and affect. Behavior is normal.     Assessment & Plan:

## 2014-09-05 NOTE — Progress Notes (Signed)
Pre visit review using our clinic review tool, if applicable. No additional management support is needed unless otherwise documented below in the visit note. 

## 2014-09-05 NOTE — Assessment & Plan Note (Signed)
Ok for xanax bid low dose prn,  to f/u any worsening symptoms or concerns

## 2014-09-05 NOTE — Patient Instructions (Signed)
You had the steroid shot today  Please take all new medication as prescribed - the actonel monthly pill, and the potassium pill daily  OK to increase the xanax to twice per day as needed  Please take the Miralax every day to keep regular  Please continue all other medications as before, including the flonase every day  Please have the pharmacy call with any other refills you may need.  Please continue your efforts at being more active, low cholesterol diet, and weight control.  You are otherwise up to date with prevention measures today.  Please keep your appointments with your specialists as you may have planned  Please go to the LAB in the Basement (turn left off the elevator) for the tests to be done today  You will be contacted by phone if any changes need to be made immediately.  Otherwise, you will receive a letter about your results with an explanation, but please check with MyChart first.  Please remember to sign up for MyChart if you have not done so, as this will be important to you in the future with finding out test results, communicating by private email, and scheduling acute appointments online when needed.  Please return in 6 months, or sooner if needed

## 2014-09-06 LAB — TSH: TSH: 1.15 u[IU]/mL (ref 0.35–4.50)

## 2014-09-06 LAB — LIPID PANEL
CHOLESTEROL: 176 mg/dL (ref 0–200)
HDL: 66.5 mg/dL (ref >39.00)
LDL Cholesterol: 92 mg/dL (ref 0–99)
NONHDL: 109.5
Total CHOL/HDL Ratio: 3
Triglycerides: 90 mg/dL (ref 0.0–149.0)
VLDL: 18 mg/dL (ref 0.0–40.0)

## 2014-09-06 LAB — URINALYSIS, ROUTINE W REFLEX MICROSCOPIC
Bilirubin Urine: NEGATIVE
Hgb urine dipstick: NEGATIVE
KETONES UR: NEGATIVE
Leukocytes, UA: NEGATIVE
Nitrite: NEGATIVE
PH: 7 (ref 5.0–8.0)
RBC / HPF: NONE SEEN (ref 0–?)
Specific Gravity, Urine: 1.01 (ref 1.000–1.030)
Total Protein, Urine: NEGATIVE
URINE GLUCOSE: NEGATIVE
Urobilinogen, UA: 0.2 (ref 0.0–1.0)

## 2014-09-06 LAB — HEPATIC FUNCTION PANEL
ALBUMIN: 4 g/dL (ref 3.5–5.2)
ALT: 11 U/L (ref 0–35)
AST: 22 U/L (ref 0–37)
Alkaline Phosphatase: 71 U/L (ref 39–117)
Bilirubin, Direct: 0.1 mg/dL (ref 0.0–0.3)
Total Bilirubin: 0.3 mg/dL (ref 0.2–1.2)
Total Protein: 7.7 g/dL (ref 6.0–8.3)

## 2014-09-06 LAB — BASIC METABOLIC PANEL
BUN: 17 mg/dL (ref 6–23)
CALCIUM: 9.9 mg/dL (ref 8.4–10.5)
CO2: 29 meq/L (ref 19–32)
Chloride: 98 mEq/L (ref 96–112)
Creatinine, Ser: 1.38 mg/dL — ABNORMAL HIGH (ref 0.40–1.20)
GFR: 47.59 mL/min — AB (ref >60.00)
Glucose, Bld: 119 mg/dL — ABNORMAL HIGH (ref 70–99)
Potassium: 3.8 mEq/L (ref 3.5–5.1)
SODIUM: 134 meq/L — AB (ref 135–145)

## 2014-09-06 LAB — VITAMIN D 25 HYDROXY (VIT D DEFICIENCY, FRACTURES): VITD: 30.97 ng/mL (ref 30.00–100.00)

## 2014-09-29 ENCOUNTER — Other Ambulatory Visit: Payer: Self-pay | Admitting: Internal Medicine

## 2014-09-30 ENCOUNTER — Telehealth: Payer: Self-pay | Admitting: Neurology

## 2014-09-30 NOTE — Telephone Encounter (Signed)
Pt's daughter called with multiple concerns - offered several appt times - she chose convenient time - will be seen this week to discuss.

## 2014-09-30 NOTE — Telephone Encounter (Signed)
Daughter is calling about her mother who had a seizure, the first one this year. She is really weak and having problems walking, numbness in foot and loss of appetite. Do they need to make a medication change or bring her in?  Please call.

## 2014-10-03 ENCOUNTER — Ambulatory Visit (INDEPENDENT_AMBULATORY_CARE_PROVIDER_SITE_OTHER): Payer: Medicare PPO | Admitting: Neurology

## 2014-10-03 ENCOUNTER — Encounter: Payer: Self-pay | Admitting: Neurology

## 2014-10-03 VITALS — BP 152/82 | HR 72 | Ht 63.0 in | Wt 152.0 lb

## 2014-10-03 DIAGNOSIS — R55 Syncope and collapse: Secondary | ICD-10-CM | POA: Diagnosis not present

## 2014-10-03 MED ORDER — LACOSAMIDE 100 MG PO TABS: 100.0000 mg | ORAL_TABLET | Freq: Two times a day (BID) | ORAL | Status: DC

## 2014-10-03 NOTE — Progress Notes (Signed)
[facility]  PATIENT: Teresa Riley DOB: April 01, 1935   REASON FOR VISIT: follow up seizure disorder and memory disorder   HISTORY OF PRESENT ILLNESS: Teresa Riley, 80 year old female returns for followup. She is now living with her daughter Teresa Riley.  She was referred by her primary care physician Dr. 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  She began to have seizure since 2013, she felt overheated, then become confused, blank stares, then slump down, body jerking movement.  She was put on Keppra, last seizure was in August 2014, she is now taking Keppra 1000 mg twice a day, tolerating it well, also taking Aricept 10 mg daily,  She now attends adult day care program, no longer driving, no gait difficulty,  MRI scan of the brain in 2014, shows extensive changes of chroniic microvascular ischemia and moderate degree of generalized cerebral atrophy and ventricular enlargement which appear slightly more advanced compared with previous MRI dated 06/03/2006.  EEG in the past showed left-sided slowing  UPDATE Oct 22nd 2015: She had one seizure 7:30 AM this morning, witnessed by her daughter, she was ready to go out of the house, collaped on the floor, dazed into space, no incontinence, lasting for few minutes, EMS was called, blood pressure was stable 130 over 80  She has been taking Keppra 1000 mg twice a day since 2014, compliant with her medications, on titrating dose of  Namenda,  She is now back to her baseline  UPDATE Nov 3rd 2015; She had another seizure in Oct 28th 2015, lasting 25 minutes. She is sitting on bar stool, then slumped over, unconscious.  She was taking Keppra 1000/1500mg , EEG was normal. We have reviewed MRI of the brain, in comparison to January 2014, no significant change, continued evidence of severe generalized atrophy, ventriculomegaly, moderate to severe white matter disease, she is now on vimpat  bid, and Keppra  bid since Oct 28th 2015, she complains of foggy sensation, wekanes s all over  UPDATE March 24th 2016: She has one seizure in Dec 2015,  Most recent one was last week March 19th,  she felt weak, faint sensation, confused look on her face, body slumped over transient loss of consciousness for less than 1 minute, extreme fatigue, mild increased confusion afterwords, she also complains of mild unsteady gait,, Now she is taking keppra  bid, vimpat  bid,  She denies chest pain, no heart palpitation,   REVIEW OF SYSTEMS: Full 14 system review of systems performed and notable only for those listed, all others are neg: Activity change, appetite change, fatigue, eye redness, eye pain, cough, choking, chest tightness, swallowing abdomen, constipation, frequent wakening, difficulty urinating, joint pain, walking difficulty, dizziness, numbness, weakness  ALLERGIES: No Known Allergies  HOME MEDICATIONS: Outpatient Prescriptions Prior to Visit  Medication Sig Dispense Refill  . albuterol (PROVENTIL HFA;VENTOLIN HFA) 108 (90 BASE) MCG/ACT inhaler Inhale 2 puffs into the lungs every 6 (six) hours as needed for wheezing or shortness of breath. 1 Inhaler 5  . ALPRAZolam (XANAX) 0.25  MG tablet Take 1 tablet (0.25 mg total) by mouth 2 (two) times daily as needed for anxiety. 60 tablet 5  . amLODipine (NORVASC) 5 MG tablet Take 1 tablet (5 mg total) by mouth daily. 90 tablet 3  . aspirin EC 81 MG tablet Take 81 mg by mouth  daily with breakfast.    . atorvastatin (LIPITOR) 10 MG tablet Take 10 mg by mouth daily with breakfast.    . Calcium-Magnesium-Vitamin D (CALCIUM MAGNESIUM PO) Take 1 tablet by mouth daily.    . cycloSPORINE (RESTASIS) 0.05 % ophthalmic emulsion Place 1 drop into both eyes at bedtime.    . fluticasone (FLONASE) 50 MCG/ACT nasal spray instill 1 spray into each nostril once daily 16 g 2  . hydrochlorothiazide (HYDRODIURIL) 25 MG tablet take 1 tablet by mouth once daily 90 tablet 3  . labetalol (NORMODYNE) 300 MG tablet Take 300 mg by mouth 2 (two) times daily.     Marland Kitchen. lacosamide (VIMPAT) 50 MG TABS tablet Take 1 tablet (50 mg total) by mouth 2 (two) times daily. 60 tablet 5  . levETIRAcetam (KEPPRA) 1000 MG tablet 1.5 tabs (1500mg ) po twice daily (in am and pm) 90 tablet 6  . lisinopril (PRINIVIL,ZESTRIL) 40 MG tablet take 1 tablet by mouth once daily 90 tablet 3  . loratadine (CLARITIN) 10 MG tablet Take 10 mg by mouth daily.    . Memantine HCl ER 28 MG CP24 Take 28 mg by mouth daily. 30 capsule 11  . Multiple Vitamin (MULTIVITAMIN WITH MINERALS) TABS tablet Take 1 tablet by mouth daily.    . Omega-3 Fatty Acids (OMEGA 3 PO) Take 1 capsule by mouth daily.    Bertram Gala. Polyethyl Glycol-Propyl Glycol (SYSTANE OP) Place 2-4 drops into both eyes 4 (four) times daily as needed (for dry eyes).     . potassium chloride (KLOR-CON 10) 10 MEQ tablet Take 1 tablet (10 mEq total) by mouth daily. 90 tablet 3  . risedronate (ACTONEL) 150 MG tablet Take 1 tablet (150 mg total) by mouth every 30 (thirty) days. with water on empty stomach, nothing by mouth or lie down for next 30 minutes. 12 tablet 3  . sertraline (ZOLOFT) 50 MG tablet Take 1 tablet (50 mg total) by mouth daily. 30 tablet 11   No facility-administered medications prior to visit.    PAST MEDICAL HISTORY: Past Medical History  Diagnosis Date  . Hypertension   . HTN (hypertension) 10/29/2010  . H/O: hysterectomy 10/29/2010  . Chronic constipation  10/29/2010  . Eczema 10/29/2010  . History of cervical cancer 10/29/2010  . Allergic rhinitis, cause unspecified 10/29/2010  . Hyperlipidemia   . Orthostatic hypotension 06/05/2012  . Impaired glucose tolerance 08/16/2012  . Memory loss   . Seizures   . Vitamin D deficiency 09/05/2014  . Allergic rhinitis 09/05/2014  . Osteoporosis   . Right hip pain   . Gait difficulty   . Loss of appetite     PAST SURGICAL HISTORY: Past Surgical History  Procedure Laterality Date  . Abdominal hysterectomy  1970  . Breast biopsy  1980's    benign  . Cataract extraction w/ intraocular lens  implant, bilateral  01/2011  . Eye surgery      FAMILY HISTORY: Family History  Problem Relation Age of Onset  . Hypertension Mother   . Hypertension Father   . High blood pressure      SOCIAL HISTORY: History   Social History  . Marital Status: Divorced    Spouse  Name: N/A  . Number of Children: 3  . Years of Education: 2   Occupational History  . retired    Social History Main Topics  . Smoking status: Former Smoker    Types: Cigarettes  . Smokeless tobacco: Never Used     Comment: quit in 1973  . Alcohol Use: No  . Drug Use: No  . Sexual Activity: No   Other Topics Concern  . Not on file   Social History Narrative   Patient lives at home with two daughters Wadie Lessen) Anda Latina). Patient is retired. Patient has two years college.     PHYSICAL EXAM  Filed Vitals:   10/03/14 0839  BP: 152/82  Pulse: 72  Height:  (1.6 m)  Weight: 152 lb (68.947 kg)   Body mass index is 26.93 kg/(m^2). PHYSICAL EXAMNIATION:  Gen: NAD, conversant, well nourised, obese, well groomed                     Cardiovascular: Regular rate rhythm, no peripheral edema, warm, nontender. Eyes: Conjunctivae clear without exudates or hemorrhage Neck: Supple, no carotid bruise. Pulmonary: Clear to auscultation bilaterally   NEUROLOGICAL EXAM:  MENTAL STATUS: Speech:    Speech is normal;  fluent and spontaneous with normal comprehension.  Cognition: Mini-Mental Status Examination is 26 out of 30, she missed one out of 3 recalls, not oriented to date, has difficulty spell world backwards    The patient is oriented to person, place, and time;     recent and remote memory intact;     language fluent;     normal attention, concentration,     fund of knowledge.  CRANIAL NERVES: CN II: Visual fields are full to confrontation. Fundoscopic exam is normal with sharp discs and no vascular changes. Venous pulsations are present bilaterally. Pupils are 4 mm and briskly reactive to light. Visual acuity is 20/20 bilaterally. CN III, IV, VI: extraocular movement are normal. No ptosis. CN V: Facial sensation is intact to pinprick in all 3 divisions bilaterally. Corneal responses are intact.  CN VII: Face is symmetric with normal eye closure and smile. CN VIII: Hearing is normal to rubbing fingers CN IX, X: Palate elevates symmetrically. Phonation is normal. CN XI: Head turning and shoulder shrug are intact CN XII: Tongue is midline with normal movements and no atrophy.  MOTOR: There is no pronator drift of out-stretched arms. Muscle bulk and tone are normal. Muscle strength is normal.   Shoulder abduction Shoulder external rotation Elbow flexion Elbow extension Wrist flexion Wrist extension Finger abduction Hip flexion Knee flexion Knee extension Ankle dorsi flexion Ankle plantar flexion  R L REFLEXES: Reflexes are 2+ and symmetric at the biceps, triceps, knees, and ankles. Plantar responses are flexor.  SENSORY: Light touch, pinprick, position sense, and vibration sense are intact in fingers and toes.  COORDINATION: Rapid alternating movements and fine finger movements are intact. There is no dysmetria on finger-to-nose and heel-knee-shin. There are no abnormal or extraneous movements.   GAIT/STANCE: Posture is normal. Cautious,  mildly unsteady Romberg is absent.    DIAGNOSTIC DATA (LABS, IMAGING, TESTING) - I reviewed patient records, labs, notes, testing and imaging myself where available.  Lab Results  Component Value Date   WBC 5.2 09/05/2014   HGB 11.4* 09/05/2014   HCT 33.3* 09/05/2014  MCV 92.1 09/05/2014   PLT 258.0 09/05/2014      Component Value Date/Time   NA 134* 09/05/2014 1724   K 3.8 09/05/2014 1724   CL 98 09/05/2014 1724   CO2 29 09/05/2014 1724   GLUCOSE 119* 09/05/2014 1724   BUN 17 09/05/2014 1724   CREATININE 1.38* 09/05/2014 1724   CALCIUM 9.9 09/05/2014 1724   PROT 7.7 09/05/2014 1724   ALBUMIN 4.0 09/05/2014 1724   AST 22 09/05/2014 1724   ALT 11 09/05/2014 1724   ALKPHOS 71 09/05/2014 1724   BILITOT 0.3 09/05/2014 1724   GFRNONAA 43* 06/23/2014 1214   GFRAA 50* 06/23/2014 1214   Lab Results  Component Value Date   CHOL 176 09/05/2014   HDL 66.50 09/05/2014   LDLCALC 92 09/05/2014   LDLDIRECT 170.4 12/05/2012   TRIG 90.0 09/05/2014   CHOLHDL 3 09/05/2014    Lab Results  Component Value Date   VITAMINB12 924* 10/02/2013   Lab Results  Component Value Date   TSH 1.15 09/05/2014    ASSESSMENT AND PLAN  80 y.o. year old female  has a past medical history of hypertension, hyperlipidemia, probable complex partial seizure, mild memory trouble,  1. Probable complex partial seizure, recurrent episode despite titrating dose of Keppra, will change Keppra to 1500 mg twice a day, Vimpat to 100 mg twice a day 2. Repeat EEG 3, cardiac monitoring to rule out cardiac arrhythmia 4, home physical therapy 5, return to clinic in one month     [facility] [address], [address] [place], [place] [address] [phone]

## 2014-10-04 ENCOUNTER — Ambulatory Visit: Payer: Medicare PPO | Admitting: Nurse Practitioner

## 2014-10-07 DIAGNOSIS — Z9181 History of falling: Secondary | ICD-10-CM | POA: Diagnosis not present

## 2014-10-07 DIAGNOSIS — M6281 Muscle weakness (generalized): Secondary | ICD-10-CM | POA: Diagnosis not present

## 2014-10-07 DIAGNOSIS — G40209 Localization-related (focal) (partial) symptomatic epilepsy and epileptic syndromes with complex partial seizures, not intractable, without status epilepticus: Secondary | ICD-10-CM | POA: Diagnosis not present

## 2014-10-07 DIAGNOSIS — R413 Other amnesia: Secondary | ICD-10-CM | POA: Diagnosis not present

## 2014-10-07 DIAGNOSIS — Z8541 Personal history of malignant neoplasm of cervix uteri: Secondary | ICD-10-CM | POA: Diagnosis not present

## 2014-10-07 DIAGNOSIS — I10 Essential (primary) hypertension: Secondary | ICD-10-CM | POA: Diagnosis not present

## 2014-10-08 ENCOUNTER — Encounter: Payer: Self-pay | Admitting: Internal Medicine

## 2014-10-08 ENCOUNTER — Ambulatory Visit (INDEPENDENT_AMBULATORY_CARE_PROVIDER_SITE_OTHER): Payer: Commercial Managed Care - HMO | Admitting: Internal Medicine

## 2014-10-08 VITALS — BP 120/84 | HR 73 | Temp 98.8°F | Resp 18 | Ht 63.0 in | Wt 152.1 lb

## 2014-10-08 DIAGNOSIS — M810 Age-related osteoporosis without current pathological fracture: Secondary | ICD-10-CM | POA: Diagnosis not present

## 2014-10-08 DIAGNOSIS — R1310 Dysphagia, unspecified: Secondary | ICD-10-CM

## 2014-10-08 DIAGNOSIS — I10 Essential (primary) hypertension: Secondary | ICD-10-CM

## 2014-10-08 DIAGNOSIS — F0390 Unspecified dementia without behavioral disturbance: Secondary | ICD-10-CM | POA: Diagnosis not present

## 2014-10-08 NOTE — Progress Notes (Signed)
Pre visit review using our clinic review tool, if applicable. No additional management support is needed unless otherwise documented below in the visit note. 

## 2014-10-08 NOTE — Progress Notes (Signed)
Subjective:    Patient ID: Teresa Riley, female    DOB: [birth year], 80 y.o.   MRN: [id number]  HPI  Here to f/u with daughter, pt with dementia and somewhat difficult historian, but states at least several wks has had choking and gagging with liquids, as well dysphagia to solids where feels matter getting stuck about mid chest, but then passes.  Denies worsening reflux, abd pain, n/v, bowel change or blood. Pt denies new neurological symptoms such as new headache, or facial or extremity weakness or numbness  Pt with recent syncope and siezure, no hx of CVA, last MRI neg for acute oct 2015.  Has seen neuro - Has general weakness since the siezure, for PT to start next wk.   Pt denies fever, wt loss, night sweats, loss of appetite, or other constitutional symptoms. Pt also takes fosamax, only took one pill, symptoms ongoing prior, but wants to hold the fosamax for now due to side effect on the pharmacy info.Dementia overall stable symptomatically, and not assoc with behavioral changes such as hallucinations, paranoia, or agitation. Past Medical History  Diagnosis Date  . Hypertension   . HTN (hypertension) 10/29/2010  . H/O: hysterectomy 10/29/2010  . Chronic constipation 10/29/2010  . Eczema 10/29/2010  . History of cervical cancer 10/29/2010  . Allergic rhinitis, cause unspecified 10/29/2010  . Hyperlipidemia   . Orthostatic hypotension 06/05/2012  . Impaired glucose tolerance 08/16/2012  . Memory loss   . Seizures   . Vitamin D deficiency 09/05/2014  . Allergic rhinitis 09/05/2014  . Osteoporosis   . Right hip pain   . Gait difficulty   . Loss of appetite    Past Surgical History  Procedure Laterality Date  . Abdominal hysterectomy  1970  . Breast biopsy  1980's    benign  . Cataract extraction w/ intraocular lens  implant, bilateral  01/2011  . Eye surgery      reports that she has quit smoking. Her smoking use included Cigarettes. She has never used smokeless tobacco. She reports that  she does not drink alcohol or use illicit drugs. family history includes High blood pressure in an other family member; Hypertension in her father and mother. No Known Allergies Current Outpatient Prescriptions on File Prior to Visit  Medication Sig Dispense Refill  . albuterol (PROVENTIL HFA;VENTOLIN HFA) 108 (90 BASE) MCG/ACT inhaler Inhale 2 puffs into the lungs every 6 (six) hours as needed for wheezing or shortness of breath. 1 Inhaler 5  . ALPRAZolam (XANAX) 0.25 MG tablet Take 1 tablet (0.25 mg total) by mouth 2 (two) times daily as needed for anxiety. 60 tablet 5  . amLODipine (NORVASC) 5 MG tablet Take 1 tablet (5 mg total) by mouth daily. 90 tablet 3  . aspirin EC 81 MG tablet Take 81 mg by mouth daily with breakfast.    . atorvastatin (LIPITOR) 10 MG tablet Take 10 mg by mouth daily with breakfast.    . Calcium-Magnesium-Vitamin D (CALCIUM MAGNESIUM PO) Take 1 tablet by mouth daily.    . cycloSPORINE (RESTASIS) 0.05 % ophthalmic emulsion Place 1 drop into both eyes at bedtime.    . fluticasone (FLONASE) 50 MCG/ACT nasal spray instill 1 spray into each nostril once daily 16 g 2  . hydrochlorothiazide (HYDRODIURIL) 25 MG tablet take 1 tablet by mouth once daily 90 tablet 3  . labetalol (NORMODYNE) 300 MG tablet Take 300 mg by mouth 2 (two) times daily.     Marland Kitchen. lacosamide 100 MG TABS Take  1 tablet (100 mg total) by mouth 2 (two) times daily. 60 tablet 5  . levETIRAcetam (KEPPRA) 1000 MG tablet 1.5 tabs ( ) po twice daily (in am and pm) 90 tablet 6  . lisinopril (PRINIVIL,ZESTRIL) 40 MG tablet take 1 tablet by mouth once daily 90 tablet 3  . loratadine (CLARITIN) 10 MG tablet Take 10 mg by mouth daily.    . Memantine HCl ER 28 MG CP24 Take 28 mg by mouth daily. 30 capsule 11  . Multiple Vitamin (MULTIVITAMIN WITH MINERALS) TABS tablet Take 1 tablet by mouth daily.    . Omega-3 Fatty Acids (OMEGA 3 PO) Take 1 capsule by mouth daily.    Bertram Gala Glycol-Propyl Glycol (SYSTANE OP)  Place 2-4 drops into both eyes 4 (four) times daily as needed (for dry eyes).     . potassium chloride (KLOR-CON 10) 10 MEQ tablet Take 1 tablet (10 mEq total) by mouth daily. 90 tablet 3  . Vitamin D, Ergocalciferol, (DRISDOL) 50000 UNITS CAPS capsule Take 50,000 Units by mouth once a week.  0   No current facility-administered medications on file prior to visit.    Review of Systems  Constitutional: Negative for unusual diaphoresis or night sweats HENT: Negative for ringing in ear or discharge Eyes: Negative for double vision or worsening visual disturbance.  Respiratory: Negative for choking and stridor.   Gastrointestinal: Negative for vomiting or other signifcant bowel change Genitourinary: Negative for hematuria or change in urine volume.  Musculoskeletal: Negative for other MSK pain or swelling Skin: Negative for color change and worsening wound.  Neurological: Negative for tremors and numbness other than noted  Psychiatric/Behavioral: Negative for decreased concentration or agitation other than above       Objective:   Physical Exam BP 120/84 mmHg  Pulse 73  Temp(Src) 98.8 F (37.1 C) (Oral)  Resp 18  Ht  (1.6 m)  Wt 152 lb 1.9 oz (69.001 kg)  BMI 26.95 kg/m2  SpO2 95% VS noted, not ill appaering Constitutional: Pt appears in no significant distress HENT: Head: NCAT.  Right Ear: External ear normal.  Left Ear: External ear normal.  Eyes: . Pupils are equal, round, and reactive to light. Conjunctivae and EOM are normal Neck: Normal range of motion. Neck supple.  Cardiovascular: Normal rate and regular rhythm.   Pulmonary/Chest: Effort normal and breath sounds without rales or wheezing.  Abd:  Soft, NT, ND, + BS Neurological: Pt is alert. At baseline confused , motor grossly intact Skin: Skin is warm. No rash, no LE edema Psychiatric: Pt behavior is normal. No agitation.     Assessment & Plan:

## 2014-10-08 NOTE — Assessment & Plan Note (Signed)
stable overall by history and exam, recent data reviewed with pt, and pt to continue medical treatment as before,  to f/u any worsening symptoms or concerns BP Readings from Last 3 Encounters:  10/08/14 120/84  10/03/14 152/82  09/05/14 160/92

## 2014-10-08 NOTE — Assessment & Plan Note (Signed)
D/w results with family and pt, lowest t-score -2.9 at ls spine, c/w osteoporosis, ok to hold fosamax for now due to concerns of causing GI side effect for now, consdier re-trial later after GI evaluation

## 2014-10-08 NOTE — Patient Instructions (Signed)
Ok to stop the actonel type medication (you have Actonel on your list instead of fosamax)  Please continue all other medications as before, and refills have been done if requested.  Please have the pharmacy call with any other refills you may need.  Please keep your appointments with your specialists as you may have planned  You will be contacted regarding the referral for: Speech therapy evaluation for swallowing, as well as Gastroenterology

## 2014-10-08 NOTE — Assessment & Plan Note (Signed)
Unclear whether primarily oropharyngeal, vs esophageal phase as seems to have issue with both, I suspect likely related to dementia, no hx of stroke, exam benign, for Speech path - swallow eval to likely include mod barium swallow, and Gi referral - ? Need egd, possible dilation. O/w cont same tx for now except to hold the fosamax, will hold on PPi for now as well.

## 2014-10-08 NOTE — Assessment & Plan Note (Signed)
stable overall by history and exam, and pt to continue medical treatment as before,  to f/u any worsening symptoms or concerns 

## 2014-10-09 ENCOUNTER — Encounter: Payer: Self-pay | Admitting: Physician Assistant

## 2014-10-09 ENCOUNTER — Encounter (INDEPENDENT_AMBULATORY_CARE_PROVIDER_SITE_OTHER): Payer: Commercial Managed Care - HMO

## 2014-10-09 ENCOUNTER — Encounter: Payer: Self-pay | Admitting: *Deleted

## 2014-10-09 DIAGNOSIS — R55 Syncope and collapse: Secondary | ICD-10-CM | POA: Diagnosis not present

## 2014-10-09 NOTE — Progress Notes (Signed)
Patient ID: Teresa Riley, female   DOB: 07-16-1934, 80 y.o.   MRN: [id number] Preventice verite 30 day cardiac event monitor applied to patient.

## 2014-10-10 ENCOUNTER — Telehealth: Payer: Self-pay | Admitting: Neurology

## 2014-10-10 ENCOUNTER — Ambulatory Visit (INDEPENDENT_AMBULATORY_CARE_PROVIDER_SITE_OTHER): Payer: Medicare PPO | Admitting: Neurology

## 2014-10-10 ENCOUNTER — Telehealth: Payer: Self-pay | Admitting: *Deleted

## 2014-10-10 DIAGNOSIS — R55 Syncope and collapse: Secondary | ICD-10-CM

## 2014-10-10 NOTE — Procedures (Signed)
      History: Teresa SaucerLucy Riley is a 80 year old patient with a history of a memory disturbance, and episodes of staring off felt secondary to seizure events. She is being evaluated for seizures.  This is a routine EEG. No skull defects are noted. Medications include albuterol, Xanax, Norvasc, aspirin, Lipitor, Flonase, hydrochlorothiazide, Vimpat, labetalol, Keppra, lisinopril, Claritin, Namenda, multivitamins, potassium supplementation, and vitamin D.  EEG classification: FIRDA, right greater than left  Description of the recording: The background rhythms of this recording consists of a relatively well-modulated medium amplitude alpha rhythm of 8 Hz that is reactive to eye opening and closure. As the record progresses, photic stimulation is performed, this results in a minimal but bilateral photic driving response. Hyperventilation is also performed, and results in a minimal buildup of background rhythm activities without significant slowing seen. Throughout the recording, higher amplitude bifrontal 2 Hz delta slowing is seen intermittently, with some predominance in the right hemisphere relative to the left. At no time during the recording does there appear to be evidence of actual spike or spike-wave discharges. EKG monitor shows no evidence of cardiac rhythm abnormalities with a heart rate of 72.  Impression: This is an abnormal EEG recording secondary to bifrontal delta slowing with right hemispheric predominance. This is consistent with FIRDA. This may suggest a toxic or metabolic encephalopathy or dysfunction of the deep midline nuclei, or hydrocephalus. No epileptiform discharges were seen.

## 2014-10-10 NOTE — Telephone Encounter (Signed)
Patient returning Dr Clarisa KindredWillis's call.

## 2014-10-10 NOTE — Telephone Encounter (Signed)
I called about the EEG report. This shows bifrontal slowing consistent with FIRDA. No epileptiform discharges are seen. His can be seen with toxic or metabolic issues, hydrocephalus, dysfunction of the deep midline nuclei.

## 2014-10-10 NOTE — Telephone Encounter (Signed)
Please refer to telephone note, I have already talked with the patient.

## 2014-10-14 ENCOUNTER — Ambulatory Visit (INDEPENDENT_AMBULATORY_CARE_PROVIDER_SITE_OTHER): Payer: Commercial Managed Care - HMO | Admitting: Neurology

## 2014-10-14 ENCOUNTER — Telehealth: Payer: Self-pay | Admitting: Neurology

## 2014-10-14 ENCOUNTER — Encounter: Payer: Self-pay | Admitting: Neurology

## 2014-10-14 VITALS — BP 111/70 | HR 76 | Ht 63.0 in | Wt 153.0 lb

## 2014-10-14 DIAGNOSIS — I10 Essential (primary) hypertension: Secondary | ICD-10-CM | POA: Diagnosis not present

## 2014-10-14 DIAGNOSIS — R55 Syncope and collapse: Secondary | ICD-10-CM

## 2014-10-14 DIAGNOSIS — R413 Other amnesia: Secondary | ICD-10-CM

## 2014-10-14 NOTE — Progress Notes (Signed)
[facility]  PATIENT: Teresa Riley DOB: 05/11/1935   REASON FOR VISIT: follow up seizure disorder and memory disorder   HISTORY OF PRESENT ILLNESS: Teresa Riley, 80 year old female returns for followup. She is now living with her daughter Teresa Riley.  She was referred by her primary care physician Dr. 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  She began to have seizure since 2013, she felt overheated, then become confused, blank stares, then slump down, body jerking movement.  She was put on Keppra, last seizure was in August 2014, she is now taking Keppra 1000 mg twice a day, tolerating it well, also taking Aricept 10 mg daily,  She now attends adult day care program, no longer driving, no gait difficulty,  MRI scan of the brain in 2014, shows extensive changes of chroniic microvascular ischemia and moderate degree of generalized cerebral atrophy and ventricular enlargement which appear slightly more advanced compared with previous MRI dated 06/03/2006.  EEG in the past showed left-sided slowing  UPDATE Oct 22nd 2015: She had one seizure 7:30 AM this morning, witnessed by her daughter, she was ready to go out of the house, collaped on the floor, dazed into space, no incontinence, lasting for few minutes, EMS was called, blood pressure was stable 130 over 80  She has been taking Keppra 1000 mg twice a day since 2014, compliant with her medications, on titrating dose of  Namenda,  She is now back to her baseline  UPDATE Nov 3rd 2015; She had another seizure in Oct 28th 2015, lasting 25 minutes. She is sitting on bar stool, then slumped over, unconscious.  She was taking Keppra 1000/1500mg , EEG was normal. We have reviewed MRI of the brain, in comparison to January 2014, no significant change, continued evidence of severe generalized atrophy, ventriculomegaly, moderate to severe white matter disease, she is now on vimpat 50mg  bid, and Keppra 1000mg  bid since Oct 28th 2015, she complains of foggy sensation, wekanes s all over  UPDATE March 24th 2016: She has one seizure in Dec 2015,  Most recent one was last week March 19th,  she felt weak, faint sensation, confused look on her face, body slumped over transient loss of consciousness for less than 1 minute, extreme fatigue, mild increased confusion afterwords, she also complains of mild unsteady gait,, Now she is taking keppra 1500mg  bid, vimpat 50mg  bid,  She denies chest pain, no heart palpitation,  UPDATE April 4th 2016: She is receiving cardiac monitoring now, since March 30 first 2016, EEG showed FIRDA, no epileptiform discharge  She has more episode, April 2nd 2016, after finish eating breakfast, she got up from her table,complains of dizziness, sweating, confusion, was helped by her daughter to sit down, was able to check her blood pressure, it was within the low normal range, heart rate was eighties, her symptoms gradually improved after sitting down for a while, no passing out  She is on 4 agents treatment for hypertension, this include lisinopril 40 mg, amlodipine 5 mg, hydrochlorothiazide 25 mg, atenolol  300 mg twice a day  She tends to take all her blood pressure medication doing the morning, all of the described episodes tends to happen during the morning time  REVIEW OF SYSTEMS: Full 14 system review of systems performed and notable only for those listed, all others are neg: Fatigue, excessive  sweating, dizziness, numbness, weakness  ALLERGIES: No Known Allergies  HOME MEDICATIONS: Outpatient Prescriptions Prior to Visit  Medication Sig Dispense Refill  . albuterol (PROVENTIL HFA;VENTOLIN HFA) 108 (90 BASE) MCG/ACT inhaler Inhale 2 puffs into the lungs every 6 (six) hours as needed for wheezing or shortness of breath. 1 Inhaler 5  . ALPRAZolam (XANAX) 0.25 MG tablet Take 1 tablet (0.25 mg total) by mouth 2 (two) times daily as needed for anxiety. 60 tablet 5  . amLODipine (NORVASC) 5 MG tablet Take 1 tablet (5 mg total) by mouth daily. 90 tablet 3  . aspirin EC 81 MG tablet Take 81 mg by mouth daily with breakfast.    . atorvastatin (LIPITOR) 10 MG tablet Take 10 mg by mouth daily with breakfast.    . Calcium-Magnesium-Vitamin D (CALCIUM MAGNESIUM PO) Take 1 tablet by mouth daily.    . cycloSPORINE (RESTASIS) 0.05 % ophthalmic emulsion Place 1 drop into both eyes at bedtime.    . fluticasone (FLONASE) 50 MCG/ACT nasal spray instill 1 spray into each nostril once daily 16 g 2  . hydrochlorothiazide (HYDRODIURIL) 25 MG tablet take 1 tablet by mouth once daily 90 tablet 3  . labetalol (NORMODYNE) 300 MG tablet Take 300 mg by mouth 2 (two) times daily.     Marland Kitchen lacosamide 100 MG TABS Take 1 tablet (100 mg total) by mouth 2 (two) times daily. 60 tablet 5  . levETIRAcetam (KEPPRA) 1000 MG tablet 1.5 tabs ( ) po twice daily (in am and pm) 90 tablet 6  . lisinopril (PRINIVIL,ZESTRIL) 40 MG tablet take 1 tablet by mouth once daily 90 tablet 3  . loratadine (CLARITIN) 10 MG tablet Take 10 mg by mouth daily.    . Memantine HCl ER 28 MG CP24 Take 28 mg by mouth daily. 30 capsule 11  . Multiple Vitamin (MULTIVITAMIN WITH MINERALS) TABS tablet Take 1 tablet by mouth daily.    . Omega-3 Fatty Acids (OMEGA 3 PO) Take 1 capsule by mouth daily.    Bertram Gala Glycol-Propyl Glycol (SYSTANE OP) Place 2-4 drops into both eyes 4 (four) times daily as needed (for dry eyes).     . potassium chloride  (KLOR-CON 10) 10 MEQ tablet Take 1 tablet (10 mEq total) by mouth daily. 90 tablet 3  . Vitamin D, Ergocalciferol, (DRISDOL) 50000 UNITS CAPS capsule Take 50,000 Units by mouth once a week.  0   No facility-administered medications prior to visit.    PAST MEDICAL HISTORY: Past Medical History  Diagnosis Date  . Hypertension   . HTN (hypertension) 10/29/2010  . H/O: hysterectomy 10/29/2010  . Chronic constipation 10/29/2010  . Eczema 10/29/2010  . History of cervical cancer 10/29/2010  . Allergic rhinitis, cause unspecified 10/29/2010  . Hyperlipidemia   . Orthostatic hypotension 06/05/2012  . Impaired glucose tolerance 08/16/2012  . Memory loss   . Seizures   . Vitamin D deficiency 09/05/2014  . Allergic rhinitis 09/05/2014  . Osteoporosis   . Right hip pain   . Gait difficulty   . Loss of appetite     PAST SURGICAL HISTORY: Past Surgical History  Procedure Laterality Date  . Abdominal hysterectomy  1970  . Breast  biopsy  1980's    benign  . Cataract extraction w/ intraocular lens  implant, bilateral  01/2011  . Eye surgery      FAMILY HISTORY: Family History  Problem Relation Age of Onset  . Hypertension Mother   . Hypertension Father   . High blood pressure      SOCIAL HISTORY: History   Social History  . Marital Status: Divorced    Spouse Name: N/A  . Number of Children: 3  . Years of Education: 2   Occupational History  . retired    Social History Main Topics  . Smoking status: Former Smoker    Types: Cigarettes  . Smokeless tobacco: Never Used     Comment: quit in 1973  . Alcohol Use: No  . Drug Use: No  . Sexual Activity: No   Other Topics Concern  . Not on file   Social History Narrative   Patient lives at home with two daughters Wadie Lessen) Anda Latina). Patient is retired. Patient has two years college.     PHYSICAL EXAM  Filed Vitals:   10/14/14 1256  BP: 111/70  Pulse: 76  Height:  (1.6 m)  Weight: 153 lb (69.4 kg)    Body mass index is 27.11 kg/(m^2). PHYSICAL EXAMNIATION:  Gen: NAD, conversant, well nourised, obese, well groomed                     Cardiovascular: Regular rate rhythm, no peripheral edema, warm, nontender. Eyes: Conjunctivae clear without exudates or hemorrhage Neck: Supple, no carotid bruise. Pulmonary: Clear to auscultation bilaterally   NEUROLOGICAL EXAM:  MENTAL STATUS: Speech:    Speech is normal; fluent and spontaneous with normal comprehension.  Cognition: Mini-Mental Status Examination is 26 out of 30, she missed one out of 3 recalls, not oriented to date, has difficulty spell world backwards    The patient is oriented to person, place, and time;     recent and remote memory intact;     language fluent;     normal attention, concentration,     fund of knowledge.  CRANIAL NERVES: CN II: Visual fields are full to confrontation. Fundoscopic exam is normal with sharp discs and no vascular changes. Venous pulsations are present bilaterally. Pupils are 4 mm and briskly reactive to light. Visual acuity is 20/20 bilaterally. CN III, IV, VI: extraocular movement are normal. No ptosis. CN V: Facial sensation is intact to pinprick in all 3 divisions bilaterally. Corneal responses are intact.  CN VII: Face is symmetric with normal eye closure and smile. CN VIII: Hearing is normal to rubbing fingers CN IX, X: Palate elevates symmetrically. Phonation is normal. CN XI: Head turning and shoulder shrug are intact CN XII: Tongue is midline with normal movements and no atrophy.  MOTOR: There is no pronator drift of out-stretched arms. Muscle bulk and tone are normal. Muscle strength is normal.   Shoulder abduction Shoulder external rotation Elbow flexion Elbow extension Wrist flexion Wrist extension Finger abduction Hip flexion Knee flexion Knee extension Ankle dorsi flexion Ankle plantar flexion  R L REFLEXES: Reflexes are 2+  and symmetric at the biceps, triceps, knees, and ankles. Plantar responses are flexor.  SENSORY: Light touch, pinprick, position sense, and vibration sense are intact in fingers and toes.  COORDINATION: Rapid alternating movements and fine  finger movements are intact. There is no dysmetria on finger-to-nose and heel-knee-shin. There are no abnormal or extraneous movements.   GAIT/STANCE: Posture is normal. Cautious, mildly unsteady Romberg is absent.    DIAGNOSTIC DATA (LABS, IMAGING, TESTING) - I reviewed patient records, labs, notes, testing and imaging myself where available.  Lab Results  Component Value Date   WBC 5.2 09/05/2014   HGB 11.4* 09/05/2014   HCT 33.3* 09/05/2014   MCV 92.1 09/05/2014   PLT 258.0 09/05/2014      Component Value Date/Time   NA 134* 09/05/2014 1724   K 3.8 09/05/2014 1724   CL 98 09/05/2014 1724   CO2 29 09/05/2014 1724   GLUCOSE 119* 09/05/2014 1724   BUN 17 09/05/2014 1724   CREATININE 1.38* 09/05/2014 1724   CALCIUM 9.9 09/05/2014 1724   PROT 7.7 09/05/2014 1724   ALBUMIN 4.0 09/05/2014 1724   AST 22 09/05/2014 1724   ALT 11 09/05/2014 1724   ALKPHOS 71 09/05/2014 1724   BILITOT 0.3 09/05/2014 1724   GFRNONAA 43* 06/23/2014 1214   GFRAA 50* 06/23/2014 1214   Lab Results  Component Value Date   CHOL 176 09/05/2014   HDL 66.50 09/05/2014   LDLCALC 92 09/05/2014   LDLDIRECT 170.4 12/05/2012   TRIG 90.0 09/05/2014   CHOLHDL 3 09/05/2014    Lab Results  Component Value Date   VITAMINB12 924* 10/02/2013   Lab Results  Component Value Date   TSH 1.15 09/05/2014    ASSESSMENT AND PLAN  80 y.o. year old female  has a past medical history of hypertension, hyperlipidemia, probable complex partial seizure, mild memory trouble,  1. Probable complex partial seizure, recurrent episode despite titrating dose of Keppra, will change Keppra to 1500 mg twice a day, Vimpat to 100 mg twice a day 2. The other possibility will be  orthostatic syncope, she is on for age and treatment for blood pressure, I have suggested her to divide lisinopril 40 mg to 1/2 Tablets twice a day, keep hydrochlorothiazide 25 mg every morning, amlodipine 5 mg every evening, decrease atenolol to 300 mg every morning 3 document all the event 4 return to clinic in 6 weeks .      Terrilyn SaverYijun Laira Penninger  [facility] [address], [address] [place], [place] [address] [phone]

## 2014-10-14 NOTE — Telephone Encounter (Signed)
Pt's daughter is calling stating pt has had 2 episodes this weekend where it looked like she was fighting off a seizure.  She has went back to walking with a cane.  Pt's daughter is not sure if she if it was a seizure or a mini stroke.  She also wants Dr. Terrace ArabiaYan to go over EEG results.  Please call and advise.

## 2014-10-14 NOTE — Telephone Encounter (Signed)
Patient coming in today for an available appt.

## 2014-10-15 ENCOUNTER — Ambulatory Visit: Payer: Self-pay | Admitting: Neurology

## 2014-10-15 DIAGNOSIS — G40209 Localization-related (focal) (partial) symptomatic epilepsy and epileptic syndromes with complex partial seizures, not intractable, without status epilepticus: Secondary | ICD-10-CM | POA: Diagnosis not present

## 2014-10-15 DIAGNOSIS — Z8541 Personal history of malignant neoplasm of cervix uteri: Secondary | ICD-10-CM | POA: Diagnosis not present

## 2014-10-15 DIAGNOSIS — I10 Essential (primary) hypertension: Secondary | ICD-10-CM | POA: Diagnosis not present

## 2014-10-15 DIAGNOSIS — M6281 Muscle weakness (generalized): Secondary | ICD-10-CM | POA: Diagnosis not present

## 2014-10-15 DIAGNOSIS — Z9181 History of falling: Secondary | ICD-10-CM | POA: Diagnosis not present

## 2014-10-15 DIAGNOSIS — R413 Other amnesia: Secondary | ICD-10-CM | POA: Diagnosis not present

## 2014-10-17 DIAGNOSIS — I10 Essential (primary) hypertension: Secondary | ICD-10-CM | POA: Diagnosis not present

## 2014-10-17 DIAGNOSIS — Z8541 Personal history of malignant neoplasm of cervix uteri: Secondary | ICD-10-CM | POA: Diagnosis not present

## 2014-10-17 DIAGNOSIS — M6281 Muscle weakness (generalized): Secondary | ICD-10-CM | POA: Diagnosis not present

## 2014-10-17 DIAGNOSIS — G40209 Localization-related (focal) (partial) symptomatic epilepsy and epileptic syndromes with complex partial seizures, not intractable, without status epilepticus: Secondary | ICD-10-CM | POA: Diagnosis not present

## 2014-10-17 DIAGNOSIS — Z9181 History of falling: Secondary | ICD-10-CM | POA: Diagnosis not present

## 2014-10-17 DIAGNOSIS — R413 Other amnesia: Secondary | ICD-10-CM | POA: Diagnosis not present

## 2014-10-22 ENCOUNTER — Telehealth: Payer: Self-pay

## 2014-10-22 DIAGNOSIS — M6281 Muscle weakness (generalized): Secondary | ICD-10-CM | POA: Diagnosis not present

## 2014-10-22 DIAGNOSIS — R413 Other amnesia: Secondary | ICD-10-CM | POA: Diagnosis not present

## 2014-10-22 DIAGNOSIS — Z9181 History of falling: Secondary | ICD-10-CM | POA: Diagnosis not present

## 2014-10-22 DIAGNOSIS — G40209 Localization-related (focal) (partial) symptomatic epilepsy and epileptic syndromes with complex partial seizures, not intractable, without status epilepticus: Secondary | ICD-10-CM | POA: Diagnosis not present

## 2014-10-22 DIAGNOSIS — I10 Essential (primary) hypertension: Secondary | ICD-10-CM | POA: Diagnosis not present

## 2014-10-22 DIAGNOSIS — Z8541 Personal history of malignant neoplasm of cervix uteri: Secondary | ICD-10-CM | POA: Diagnosis not present

## 2014-10-22 NOTE — Telephone Encounter (Signed)
OK for verbal if this is ok

## 2014-10-22 NOTE — Telephone Encounter (Signed)
Revonda Standardllison called this morning and said that the patient was experiencing orthostatic blood pressure today and has been for the past few days. She also wanted to see if she could get an order for a nurse to come to the patient's home to check her blood pressure.

## 2014-10-23 ENCOUNTER — Other Ambulatory Visit: Payer: Self-pay | Admitting: Internal Medicine

## 2014-10-24 ENCOUNTER — Ambulatory Visit (INDEPENDENT_AMBULATORY_CARE_PROVIDER_SITE_OTHER): Payer: Commercial Managed Care - HMO | Admitting: Physician Assistant

## 2014-10-24 ENCOUNTER — Encounter: Payer: Self-pay | Admitting: Physician Assistant

## 2014-10-24 VITALS — BP 116/68 | HR 76 | Ht 63.0 in

## 2014-10-24 DIAGNOSIS — R1310 Dysphagia, unspecified: Secondary | ICD-10-CM | POA: Diagnosis not present

## 2014-10-24 DIAGNOSIS — K5900 Constipation, unspecified: Secondary | ICD-10-CM

## 2014-10-24 DIAGNOSIS — K219 Gastro-esophageal reflux disease without esophagitis: Secondary | ICD-10-CM | POA: Diagnosis not present

## 2014-10-24 DIAGNOSIS — Z1211 Encounter for screening for malignant neoplasm of colon: Secondary | ICD-10-CM

## 2014-10-24 MED ORDER — PANTOPRAZOLE SODIUM 40 MG PO TBEC: 40.0000 mg | DELAYED_RELEASE_TABLET | Freq: Every day | ORAL | Status: DC

## 2014-10-24 NOTE — Progress Notes (Signed)
Patient ID: [name], female   DOB: December 21, 1934, 80 y.o.   MRN: [id number]    HPI:  Teresa Riley is a 80 y.o.   female referred by Teresa Levins, MD for evaluation of dysphagia and GERD.  Teresa Riley is a delightful 80 year old female who relocated to [place] from [place] in 2012. She has a history of hypertension, hyperlipidemia, dementia, localization related focal epilepsy with simple partial seizures, constipation, osteoporosis, vitamin D deficiency, allergic rhinitis, anxiety, and cervical cancer. She reports that a month or so ago she was started on a new medication "for my bones". Shortly thereafter she began to have difficulty swallowing. Upon further questioning, she relates that she has had heartburn for years. Over the past several months her heartburn has been worse, she belches a lot, and has mouthfuls of regurgitation coming up. For the past month she has been having difficulty swallowing chicken and dry foods, but more recently she has been having difficulty swallowing fluids as well. She does not feel the food once to go down the wrong pipe, nor does she coughed or spider. She states the food gets out of her mouth and passed her throat but she points to the mid chest and says it feels like it gets stuck there. She denies a globus sensation. Her appetite has been decreased but her weight has been stable. She has no history of ulcers and denies NSAID use she did smoke for years but quit in 1974. There is no family history of gastric or esophageal cancer. The patient has had many of her teeth pulled and is wearing an old set of dentures, but her daughter says they are picking up new dentures next week.  She has had no change in her bowel habits or stool caliber. She denies any bloody or tarry stools. There is no known family history of colon cancer, colon polyps, or inflammatory bowel disease. She has never had a colonoscopy or any other form of colorectal cancer screening. Patient  states she has been constipated for most of her life. She has a bowel movement on a daily basis but it is often hard nuggets.    Past Medical History  Diagnosis Date  . Hypertension   . HTN (hypertension) 10/29/2010  . H/O: hysterectomy 10/29/2010  . Chronic constipation 10/29/2010  . Eczema 10/29/2010  . History of cervical cancer 10/29/2010  . Allergic rhinitis, cause unspecified 10/29/2010  . Hyperlipidemia   . Orthostatic hypotension 06/05/2012  . Impaired glucose tolerance 08/16/2012  . Memory loss   . Seizures   . Vitamin D deficiency 09/05/2014  . Allergic rhinitis 09/05/2014  . Osteoporosis   . Right hip pain   . Gait difficulty   . Loss of appetite   . Vascular dementia     Past Surgical History  Procedure Laterality Date  . Abdominal hysterectomy  1970  . Breast biopsy  1980's    benign  . Cataract extraction w/ intraocular lens  implant, bilateral  01/2011  . Eye surgery     Family History  Problem Relation Age of Onset  . Hypertension Mother   . Hypertension Father   . High blood pressure Daughter     x2  . Colon cancer Neg Hx   . Colon polyps Neg Hx   . Kidney disease Neg Hx   . Gallbladder disease Neg Hx   . Diabetes Father    History  Substance Use Topics  . Smoking status: Former Smoker  Types: Cigarettes    Quit date: 07/13/1971  . Smokeless tobacco: Never Used     Comment: quit in 1973  . Alcohol Use: No   Current Outpatient Prescriptions  Medication Sig Dispense Refill  . albuterol (PROVENTIL HFA;VENTOLIN HFA) 108 (90 BASE) MCG/ACT inhaler Inhale 2 puffs into the lungs every 6 (six) hours as needed for wheezing or shortness of breath. 1 Inhaler 5  . ALPRAZolam (XANAX) 0.25 MG tablet Take 1 tablet (0.25 mg total) by mouth 2 (two) times daily as needed for anxiety. 60 tablet 5  . amLODipine (NORVASC) 5 MG tablet Take 1 tablet (5 mg total) by mouth daily. 90 tablet 3  . aspirin EC 81 MG tablet Take 81 mg by mouth daily with breakfast.    .  atorvastatin (LIPITOR) 10 MG tablet take 1 tablet by mouth once daily 90 tablet 3  . Calcium-Magnesium-Vitamin D (CALCIUM MAGNESIUM PO) Take 1 tablet by mouth daily.    . cycloSPORINE (RESTASIS) 0.05 % ophthalmic emulsion Place 1 drop into both eyes at bedtime.    . fluticasone (FLONASE) 50 MCG/ACT nasal spray instill 1 spray into each nostril once daily 16 g 2  . hydrochlorothiazide (HYDRODIURIL) 25 MG tablet take 1 tablet by mouth once daily 90 tablet 3  . labetalol (NORMODYNE) 300 MG tablet Take 300 mg by mouth 2 (two) times daily.     Marland Kitchen. lacosamide 100 MG TABS Take 1 tablet (100 mg total) by mouth 2 (two) times daily. 60 tablet 5  . levETIRAcetam (KEPPRA) 1000 MG tablet 1.5 tabs (1500mg ) po twice daily (in am and pm) 90 tablet 6  . lisinopril (PRINIVIL,ZESTRIL) 40 MG tablet take 1 tablet by mouth once daily 90 tablet 3  . loratadine (CLARITIN) 10 MG tablet Take 10 mg by mouth daily.    . Memantine HCl ER 28 MG CP24 Take 28 mg by mouth daily. 30 capsule 11  . Multiple Vitamin (MULTIVITAMIN WITH MINERALS) TABS tablet Take 1 tablet by mouth daily.    . Omega-3 Fatty Acids (OMEGA 3 PO) Take 1 capsule by mouth daily.    Bertram Gala. Polyethyl Glycol-Propyl Glycol (SYSTANE OP) Place 2-4 drops into both eyes 4 (four) times daily as needed (for dry eyes).     . potassium chloride (KLOR-CON 10) 10 MEQ tablet Take 1 tablet (10 mEq total) by mouth daily. 90 tablet 3  . Vitamin D, Ergocalciferol, (DRISDOL) 50000 UNITS CAPS capsule Take 50,000 Units by mouth once a week.  0  . pantoprazole (PROTONIX) 40 MG tablet Take 1 tablet (40 mg total) by mouth daily. 30 tablet 6   No current facility-administered medications for this visit.   No Known Allergies   Review of Systems: Gen: Denies any fever, chills, sweats, anorexia, fatigue, weakness, malaise, weight loss, and sleep disorder CV: Denies chest pain, angina, palpitations, syncope, orthopnea, PND, peripheral edema, and claudication. Resp: Denies dyspnea at rest,  dyspnea with exercise, cough, sputum, wheezing, coughing up blood, and pleurisy. GI: Denies vomiting blood, jaundice, and fecal incontinence.   Has dysphagia to solids and liquids. GU : Denies urinary burning, blood in urine, urinary frequency, urinary hesitancy, nocturnal urination, and urinary incontinence. MS: Denies joint pain, limitation of movement, and swelling, stiffness, low back pain, extremity pain. Denies muscle weakness, cramps, atrophy.  Derm: Denies rash, itching, dry skin, hives, moles, warts, or unhealing ulcers.  Psych: Denies depression, anxiety, memory loss, suicidal ideation, hallucinations, paranoia, and confusion. Heme: Denies bruising, bleeding, and enlarged lymph nodes. Neuro:  Denies any  headaches, dizziness, paresthesias. Endo:  Denies any problems with DM, thyroid, adrenal function    Physical Exam: BP 116/68 mmHg  Pulse 76  Ht  (1.6 m) Constitutional: Pleasant,well-developed, elderly AA female in no acute distress. HEENT: Normocephalic and atraumatic. Conjunctivae are normal. No scleral icterus. Neck supple. No thyromegaly Cardiovascularormal rate, regular rhythm.  Pulmonary/chest: E: Nffort normal and breath sounds normal. No wheezing, rales or rhonchi. Abdominal: Soft, nondistended, nontender. Bowel sounds active throughout. There are no masses palpable. No hepatomegaly. Extremities: no edema Lymphadenopathy: No cervical adenopathy noted. Neurological: Alert and oriented to person place and time. Skin: Skin is warm and dry. No rashes noted. Psychiatric: Normal mood and affect. Behavior is normal.  ASSESSMENT AND PLAN: #1. GERD. An antireflux regimen has been reviewed. Patient will be given a trial of pantoprazole 40 mg by mouth every morning 30 minutes before breakfast.  #2. Dysphagia. It was explained to the patient that this may be some esophageal spasm due to poorly controlled reflux. However, she will be scheduled for an esophagram with barium  pill to evaluate for strictures etc. Pending the results of her esophagram, she will likely be scheduled for an EGD with dilation. In the meantime she has been advised to cut her food up very small, eat slowly, and chew thoroughly.  #3. Need for colorectal cancer screening. We have discussed the options of conventional colonoscopy versus colo guard. Patient has had no change in bowel habits. She has no family history of colon cancer or colon polyps. Patient and her family opt to proceed with Colo guard. It has been explained to them that should the Colo guard be positive she may need a formal colonoscopy.  #4. Constipation. Patient has been instructed to increase fiber in her diet she may add "P fruits-peaches, pears, prunes, plums, pineapple"to try to improve stool consistency. She has also been instructed to increase her water intake.    Teresa Riley, Moise Boring 10/24/2014, 12:52 PM  CC: Teresa Levins, MD

## 2014-10-24 NOTE — Patient Instructions (Signed)
You have been [facility] of the [facility] on 10/29/2014 at 1:30pm. Please arrive 15 minutes prior to your appointment for registration. Make certain not to have anything to eat or drink 3 hours prior to your test. If you need to reschedule for any reason, please contact radiology at [phone] to do so. __________________________________________________________________ A barium swallow is an examination that concentrates on views of the esophagus. This tends to be a double contrast exam (barium and two liquids which, when combined, create a gas to distend the wall of the oesophagus) or single contrast (non-ionic iodine based). The study is usually tailored to your symptoms so a good history is essential. Attention is paid during the study to the form, structure and configuration of the esophagus, looking for functional disorders (such as aspiration, dysphagia, achalasia, motility and reflux) EXAMINATION You may be asked to change into a gown, depending on the type of swallow being performed. A radiologist and radiographer will perform the procedure. The radiologist will advise you of the type of contrast selected for your procedure and direct you during the exam. You will be asked to stand, sit or lie in several different positions and to hold a small amount of fluid in your mouth before being asked to swallow while the imaging is performed .In some instances you may be asked to swallow barium coated marshmallows to assess the motility of a solid food bolus. The exam can be recorded as a digital or video fluoroscopy procedure. POST PROCEDURE It will take 1-2 days for the barium to pass through your system. To facilitate this, it is important, unless otherwise directed, to increase your fluids for the next 24-48hrs and to resume your normal diet.  This test typically takes about 30 minutes to  perform. __________________________________________________________________________________   We have sent the following medications to your pharmacy for you to pick up at your convenience: Pantoprazole   Make sure to eat plenty of "P" fruits (peaches, pears, prunes, plums, pineapple  Increase your water intake  Make sure to chew your food and cut into small pieces.   You will be receiving a call from [employer] to initiate the process.

## 2014-10-29 ENCOUNTER — Telehealth: Payer: Self-pay | Admitting: *Deleted

## 2014-10-29 ENCOUNTER — Ambulatory Visit (HOSPITAL_COMMUNITY)
Admission: RE | Admit: 2014-10-29 | Discharge: 2014-10-29 | Disposition: A | Discharge status: Home / Self Care | Payer: Commercial Managed Care - HMO | Source: Ambulatory Visit | Attending: Physician Assistant | Admitting: Physician Assistant

## 2014-10-29 DIAGNOSIS — K224 Dyskinesia of esophagus: Secondary | ICD-10-CM | POA: Diagnosis not present

## 2014-10-29 DIAGNOSIS — R1310 Dysphagia, unspecified: Secondary | ICD-10-CM | POA: Diagnosis present

## 2014-10-29 DIAGNOSIS — K219 Gastro-esophageal reflux disease without esophagitis: Secondary | ICD-10-CM

## 2014-10-29 DIAGNOSIS — M6281 Muscle weakness (generalized): Secondary | ICD-10-CM | POA: Diagnosis not present

## 2014-10-29 DIAGNOSIS — Z1211 Encounter for screening for malignant neoplasm of colon: Secondary | ICD-10-CM

## 2014-10-29 DIAGNOSIS — K5900 Constipation, unspecified: Secondary | ICD-10-CM

## 2014-10-29 NOTE — Telephone Encounter (Signed)
Left msg on triage stating pt is having physical therapy service with the agency. Last 2 visit she has had some orthostatis BP issues. Wanting to get a verbal order to have nurse go in to do an assessment for nursing services...Raechel Chute/lmb

## 2014-10-30 DIAGNOSIS — Z9181 History of falling: Secondary | ICD-10-CM | POA: Diagnosis not present

## 2014-10-30 DIAGNOSIS — Z8541 Personal history of malignant neoplasm of cervix uteri: Secondary | ICD-10-CM | POA: Diagnosis not present

## 2014-10-30 DIAGNOSIS — I10 Essential (primary) hypertension: Secondary | ICD-10-CM | POA: Diagnosis not present

## 2014-10-30 DIAGNOSIS — G40209 Localization-related (focal) (partial) symptomatic epilepsy and epileptic syndromes with complex partial seizures, not intractable, without status epilepticus: Secondary | ICD-10-CM | POA: Diagnosis not present

## 2014-10-30 DIAGNOSIS — M6281 Muscle weakness (generalized): Secondary | ICD-10-CM | POA: Diagnosis not present

## 2014-10-30 DIAGNOSIS — R413 Other amnesia: Secondary | ICD-10-CM | POA: Diagnosis not present

## 2014-10-30 NOTE — Telephone Encounter (Signed)
Ok for verbal 

## 2014-10-30 NOTE — Progress Notes (Signed)
Indication for CT should be abnormal aorta on CXR I think she has an aneurysm

## 2014-10-30 NOTE — Progress Notes (Signed)
I reviewed esophagram and think aorta is causing the problem. She should have a CT chest with contrast re: aortic impression on esophagus - ? Aortic aneurysm  If creatinine will not allow then do w/o contrast  Iva Booparl E. Gessner, MD, [facility]

## 2014-10-30 NOTE — Telephone Encounter (Signed)
Notified amanda with md response.../lmb 

## 2014-11-01 ENCOUNTER — Other Ambulatory Visit: Payer: Self-pay | Admitting: *Deleted

## 2014-11-01 DIAGNOSIS — Z8541 Personal history of malignant neoplasm of cervix uteri: Secondary | ICD-10-CM | POA: Diagnosis not present

## 2014-11-01 DIAGNOSIS — G40209 Localization-related (focal) (partial) symptomatic epilepsy and epileptic syndromes with complex partial seizures, not intractable, without status epilepticus: Secondary | ICD-10-CM | POA: Diagnosis not present

## 2014-11-01 DIAGNOSIS — Z9181 History of falling: Secondary | ICD-10-CM | POA: Diagnosis not present

## 2014-11-01 DIAGNOSIS — M6281 Muscle weakness (generalized): Secondary | ICD-10-CM | POA: Diagnosis not present

## 2014-11-01 DIAGNOSIS — K219 Gastro-esophageal reflux disease without esophagitis: Secondary | ICD-10-CM

## 2014-11-01 DIAGNOSIS — I10 Essential (primary) hypertension: Secondary | ICD-10-CM | POA: Diagnosis not present

## 2014-11-01 DIAGNOSIS — R1314 Dysphagia, pharyngoesophageal phase: Secondary | ICD-10-CM

## 2014-11-01 DIAGNOSIS — R413 Other amnesia: Secondary | ICD-10-CM | POA: Diagnosis not present

## 2014-11-02 DIAGNOSIS — I10 Essential (primary) hypertension: Secondary | ICD-10-CM | POA: Diagnosis not present

## 2014-11-02 DIAGNOSIS — Z8541 Personal history of malignant neoplasm of cervix uteri: Secondary | ICD-10-CM | POA: Diagnosis not present

## 2014-11-02 DIAGNOSIS — G40209 Localization-related (focal) (partial) symptomatic epilepsy and epileptic syndromes with complex partial seizures, not intractable, without status epilepticus: Secondary | ICD-10-CM | POA: Diagnosis not present

## 2014-11-02 DIAGNOSIS — Z9181 History of falling: Secondary | ICD-10-CM | POA: Diagnosis not present

## 2014-11-02 DIAGNOSIS — M6281 Muscle weakness (generalized): Secondary | ICD-10-CM | POA: Diagnosis not present

## 2014-11-02 DIAGNOSIS — R413 Other amnesia: Secondary | ICD-10-CM | POA: Diagnosis not present

## 2014-11-05 ENCOUNTER — Other Ambulatory Visit: Payer: Self-pay | Admitting: Physician Assistant

## 2014-11-05 ENCOUNTER — Other Ambulatory Visit: Payer: Self-pay | Admitting: *Deleted

## 2014-11-06 ENCOUNTER — Ambulatory Visit (HOSPITAL_COMMUNITY)
Admission: RE | Admit: 2014-11-06 | Discharge: 2014-11-06 | Disposition: A | Discharge status: Home / Self Care | Payer: Commercial Managed Care - HMO | Source: Ambulatory Visit | Attending: Physician Assistant | Admitting: Physician Assistant

## 2014-11-06 ENCOUNTER — Encounter (HOSPITAL_COMMUNITY): Payer: Self-pay

## 2014-11-06 DIAGNOSIS — I700 Atherosclerosis of aorta: Secondary | ICD-10-CM | POA: Insufficient documentation

## 2014-11-06 DIAGNOSIS — I2510 Atherosclerotic heart disease of native coronary artery without angina pectoris: Secondary | ICD-10-CM | POA: Diagnosis not present

## 2014-11-06 DIAGNOSIS — K219 Gastro-esophageal reflux disease without esophagitis: Secondary | ICD-10-CM | POA: Diagnosis present

## 2014-11-06 DIAGNOSIS — R1314 Dysphagia, pharyngoesophageal phase: Secondary | ICD-10-CM

## 2014-11-06 LAB — POCT I-STAT CREATININE: CREATININE: 1.3 mg/dL — AB (ref 0.50–1.10)

## 2014-11-06 MED ORDER — IOHEXOL 300 MG/ML  SOLN
80.0000 mL | Freq: Once | INTRAMUSCULAR | Status: AC | PRN
  Administered 2014-11-06: 80 mL via INTRAVENOUS

## 2014-11-06 NOTE — Progress Notes (Signed)
Spoke with Verlon AuLeslie CMA-working with Dr. Lawson FiscalLori Hvozdovic, regarding her labs today, I-stat creatinine will be drawn to check her renal function. Ok not to run BMP

## 2014-11-06 NOTE — Progress Notes (Signed)
Quick Note:  OK no problem Have her see me next available to review things in office There is a narrow area in the esophagus but probably nothing bad. ______

## 2014-11-06 NOTE — Progress Notes (Signed)
Quick Note:  Yes needs EGD to evaluate abnormal esophagus on Ba swallow and dysphagia Thanks ______

## 2014-11-07 DIAGNOSIS — G40209 Localization-related (focal) (partial) symptomatic epilepsy and epileptic syndromes with complex partial seizures, not intractable, without status epilepticus: Secondary | ICD-10-CM | POA: Diagnosis not present

## 2014-11-07 DIAGNOSIS — Z9181 History of falling: Secondary | ICD-10-CM | POA: Diagnosis not present

## 2014-11-07 DIAGNOSIS — M6281 Muscle weakness (generalized): Secondary | ICD-10-CM | POA: Diagnosis not present

## 2014-11-07 DIAGNOSIS — I10 Essential (primary) hypertension: Secondary | ICD-10-CM | POA: Diagnosis not present

## 2014-11-07 DIAGNOSIS — Z8541 Personal history of malignant neoplasm of cervix uteri: Secondary | ICD-10-CM | POA: Diagnosis not present

## 2014-11-07 DIAGNOSIS — R413 Other amnesia: Secondary | ICD-10-CM | POA: Diagnosis not present

## 2014-11-08 ENCOUNTER — Ambulatory Visit: Payer: Medicare PPO | Admitting: Neurology

## 2014-11-13 DIAGNOSIS — M6281 Muscle weakness (generalized): Secondary | ICD-10-CM | POA: Diagnosis not present

## 2014-11-13 DIAGNOSIS — Z8541 Personal history of malignant neoplasm of cervix uteri: Secondary | ICD-10-CM | POA: Diagnosis not present

## 2014-11-13 DIAGNOSIS — G40209 Localization-related (focal) (partial) symptomatic epilepsy and epileptic syndromes with complex partial seizures, not intractable, without status epilepticus: Secondary | ICD-10-CM | POA: Diagnosis not present

## 2014-11-13 DIAGNOSIS — Z9181 History of falling: Secondary | ICD-10-CM | POA: Diagnosis not present

## 2014-11-13 DIAGNOSIS — I10 Essential (primary) hypertension: Secondary | ICD-10-CM | POA: Diagnosis not present

## 2014-11-13 DIAGNOSIS — R413 Other amnesia: Secondary | ICD-10-CM | POA: Diagnosis not present

## 2014-11-14 ENCOUNTER — Ambulatory Visit: Payer: Commercial Managed Care - HMO | Admitting: Neurology

## 2014-11-20 ENCOUNTER — Encounter: Payer: Self-pay | Admitting: Neurology

## 2014-11-20 ENCOUNTER — Ambulatory Visit (INDEPENDENT_AMBULATORY_CARE_PROVIDER_SITE_OTHER): Payer: Commercial Managed Care - HMO | Admitting: Neurology

## 2014-11-20 VITALS — BP 116/69 | HR 73 | Ht 63.0 in | Wt 155.0 lb

## 2014-11-20 DIAGNOSIS — I10 Essential (primary) hypertension: Secondary | ICD-10-CM

## 2014-11-20 DIAGNOSIS — R413 Other amnesia: Secondary | ICD-10-CM

## 2014-11-20 MED ORDER — LISINOPRIL 40 MG PO TABS: 20.0000 mg | ORAL_TABLET | Freq: Every day | ORAL | Status: DC

## 2014-11-20 MED ORDER — LABETALOL HCL 300 MG PO TABS: 300.0000 mg | ORAL_TABLET | ORAL | Status: DC

## 2014-11-20 NOTE — Progress Notes (Signed)
Chief Complaint  Patient presents with  . Dizziness    She is doing better since her blood pressure medications were changed.  She had one episode of dizziness several days after the initial dose change but no further events have occurred.     [facility]  PATIENT: Teresa Riley DOB: 01/31/1935   REASON FOR VISIT: follow up seizure disorder and memory disorder   HISTORY OF PRESENT ILLNESS: Teresa Riley, 80 year old female returns for followup. She is now living with her daughter Teresa Riley.  She was referred by her primary care physician Dr. 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  She began to have seizure since 2013, she felt overheated, then become confused, blank stares, then slump down, body jerking movement.  She was put on Keppra, last seizure was in August 2014, she is now taking Keppra 1000 mg twice a day, tolerating it well, also taking Aricept 10 mg daily,  She now attends adult day care program, no longer driving, no gait difficulty,  MRI scan of the brain in 2014, shows extensive changes of chroniic microvascular ischemia and moderate degree of generalized cerebral atrophy and ventricular enlargement which appear slightly more advanced compared with previous MRI dated 06/03/2006.  EEG in the past showed left-sided slowing  UPDATE Oct 22nd 2015: She had one seizure 7:30 AM this morning, witnessed by her daughter, she was ready to go out of the house, collaped  on the floor, dazed into space, no incontinence, lasting for few minutes, EMS was called, blood pressure was stable 130 over 80  She has been taking Keppra 1000 mg twice a day since 2014, compliant with her medications, on titrating dose of Namenda,  She is now back to her baseline  UPDATE Nov 3rd 2015; She had another seizure in Oct 28th 2015, lasting 25 minutes. She is sitting on bar stool, then slumped over, unconscious.  She was taking Keppra 1000/1500mg , EEG was normal. We have reviewed MRI of the brain, in comparison to January 2014, no significant change, continued evidence of severe generalized atrophy, ventriculomegaly, moderate to severe white matter disease, she is now on vimpat  bid, and Keppra  bid since Oct 28th 2015, she complains of foggy sensation, wekanes s all over  UPDATE March 24th 2016: She has one seizure in Dec 2015,  Most recent one was last week March 19th,  she felt weak, faint sensation, confused look on her face, body slumped over transient loss of consciousness for less than 1 minute, extreme fatigue, mild increased confusion afterwords, she also complains of mild unsteady gait,, Now she is taking keppra  bid, vimpat  bid,  She denies chest pain, no heart palpitation,  UPDATE April 4th 2016: She is receiving cardiac monitoring now, since March 30 first 2016, EEG showed FIRDA, no epileptiform discharge  She has more episode, April 2nd 2016, after finish eating breakfast, she got up from her table,complains of dizziness, sweating, confusion, was helped by her daughter to sit down, was able to check her blood  pressure, it was within the low normal range, heart rate was eighties, her symptoms gradually improved after sitting down for a while, no passing out  She is on 4 agents treatment for hypertension, this include lisinopril 40 mg, amlodipine 5 mg, hydrochlorothiazide 25 mg, atenolol 300 mg twice a day  She tends to take all her blood pressure  medication doing the morning, all of the described episodes tends to happen during the morning time  UPDATE Nov 20 2014: Since last visit in April 2016, decrease her hypertension medications, her symptoms has much improved, she is on lisinopril 40 mg half tablets twice a day, amlodipine 5 mg every night, atenolol 300 mg every morning, today's blood pressure is 116/79  I have suggested her to stop hydrochlorothiazide, last potassium was 3.8, in February 2016, will also stop potassium supplement REVIEW OF SYSTEMS: Full 14 system review of systems performed and notable only for those listed, all others are neg: Fatigue, excessive sweating, dizziness, numbness, weakness  ALLERGIES: No Known Allergies  HOME MEDICATIONS: Outpatient Prescriptions Prior to Visit  Medication Sig Dispense Refill  . albuterol (PROVENTIL HFA;VENTOLIN HFA) 108 (90 BASE) MCG/ACT inhaler Inhale 2 puffs into the lungs every 6 (six) hours as needed for wheezing or shortness of breath. 1 Inhaler 5  . ALPRAZolam (XANAX) 0.25 MG tablet Take 1 tablet (0.25 mg total) by mouth 2 (two) times daily as needed for anxiety. 60 tablet 5  . amLODipine (NORVASC) 5 MG tablet Take 1 tablet (5 mg total) by mouth daily. 90 tablet 3  . aspirin EC 81 MG tablet Take 81 mg by mouth daily with breakfast.    . atorvastatin (LIPITOR) 10 MG tablet take 1 tablet by mouth once daily 90 tablet 3  . Calcium-Magnesium-Vitamin D (CALCIUM MAGNESIUM PO) Take 1 tablet by mouth daily.    . cycloSPORINE (RESTASIS) 0.05 % ophthalmic emulsion Place 1 drop into both eyes at bedtime.    . fluticasone (FLONASE) 50 MCG/ACT nasal spray instill 1 spray into each nostril once daily 16 g 2  . hydrochlorothiazide (HYDRODIURIL) 25 MG tablet take 1 tablet by mouth once daily 90 tablet 3  . labetalol (NORMODYNE) 300 MG tablet Take 300 mg by mouth 2 (two) times daily.     Marland Kitchen lacosamide 100 MG TABS Take 1 tablet (100 mg total) by mouth 2 (two) times daily. 60 tablet 5  .  levETIRAcetam (KEPPRA) 1000 MG tablet 1.5 tabs ( ) po twice daily (in am and pm) 90 tablet 6  . lisinopril (PRINIVIL,ZESTRIL) 40 MG tablet take 1 tablet by mouth once daily 90 tablet 3  . loratadine (CLARITIN) 10 MG tablet Take 10 mg by mouth daily.    . Memantine HCl ER 28 MG CP24 Take 28 mg by mouth daily. 30 capsule 11  . Multiple Vitamin (MULTIVITAMIN WITH MINERALS) TABS tablet Take 1 tablet by mouth daily.    . Omega-3 Fatty Acids (OMEGA 3 PO) Take 1 capsule by mouth daily.    . pantoprazole (PROTONIX) 40 MG tablet Take 1 tablet (40 mg total) by mouth daily. 30 tablet 6  . Polyethyl Glycol-Propyl Glycol (SYSTANE OP) Place 2-4 drops into both eyes 4 (four) times daily as needed (for dry eyes).     . potassium chloride (KLOR-CON 10) 10 MEQ tablet Take 1 tablet (10 mEq total) by mouth daily. 90 tablet 3  . Vitamin D, Ergocalciferol, (DRISDOL) 50000 UNITS CAPS capsule Take 50,000 Units by mouth once a week.  0   No facility-administered medications  prior to visit.    PAST MEDICAL HISTORY: Past Medical History  Diagnosis Date  . Hypertension   . HTN (hypertension) 10/29/2010  . H/O: hysterectomy 10/29/2010  . Chronic constipation 10/29/2010  . Eczema 10/29/2010  . History of cervical cancer 10/29/2010  . Allergic rhinitis, cause unspecified 10/29/2010  . Hyperlipidemia   . Orthostatic hypotension 06/05/2012  . Impaired glucose tolerance 08/16/2012  . Memory loss   . Seizures   . Vitamin D deficiency 09/05/2014  . Allergic rhinitis 09/05/2014  . Osteoporosis   . Right hip pain   . Gait difficulty   . Loss of appetite   . Vascular dementia     PAST SURGICAL HISTORY: Past Surgical History  Procedure Laterality Date  . Abdominal hysterectomy  1970  . Breast biopsy  1980's    benign  . Cataract extraction w/ intraocular lens  implant, bilateral  01/2011  . Eye surgery      FAMILY HISTORY: Family History  Problem Relation Age of Onset  . Hypertension Mother   . Hypertension  Father   . High blood pressure Daughter     x2  . Colon cancer Neg Hx   . Colon polyps Neg Hx   . Kidney disease Neg Hx   . Gallbladder disease Neg Hx   . Diabetes Father     SOCIAL HISTORY: History   Social History  . Marital Status: Divorced    Spouse Name: N/A  . Number of Children: 3  . Years of Education: 2   Occupational History  . retired Runner, broadcasting/film/videoteacher    Social History Main Topics  . Smoking status: Former Smoker    Types: Cigarettes    Quit date: 07/13/1971  . Smokeless tobacco: Never Used     Comment: quit in 1973  . Alcohol Use: No  . Drug Use: No  . Sexual Activity: No   Other Topics Concern  . Not on file   Social History Narrative   Patient lives at home with two daughters Wadie Lessen(Angela Moore) Anda Latina(Freda Mitchell). Patient is retired. Patient has two years college.     PHYSICAL EXAM  Filed Vitals:   11/20/14 1050  BP: 116/69  Pulse: 73  Height: 5\' 3"  (1.6 m)  Weight: 155 lb (70.308 kg)   Body mass index is 27.46 kg/(m^2). PHYSICAL EXAMNIATION:  Gen: NAD, conversant, well nourised, obese, well groomed                     Cardiovascular: Regular rate rhythm, no peripheral edema, warm, nontender. Eyes: Conjunctivae clear without exudates or hemorrhage Neck: Supple, no carotid bruise. Pulmonary: Clear to auscultation bilaterally   NEUROLOGICAL EXAM:  MENTAL STATUS: Speech:    Speech is normal; fluent and spontaneous with normal comprehension.  Cognition: Mini-Mental Status Examination is 26 out of 30, she missed one out of 3 recalls, not oriented to date, has difficulty spell world backwards    The patient is oriented to person, place, and time;     recent and remote memory intact;     language fluent;     normal attention, concentration,     fund of knowledge.  CRANIAL NERVES: CN II: Visual fields are full to confrontation. Fundoscopic exam is normal with sharp discs and no vascular changes. Venous pulsations are present bilaterally. Pupils are 4 mm  and briskly reactive to light. Visual acuity is 20/20 bilaterally. CN III, IV, VI: extraocular movement are normal. No ptosis. CN V: Facial sensation is intact to  pinprick in all 3 divisions bilaterally. Corneal responses are intact.  CN VII: Face is symmetric with normal eye closure and smile. CN VIII: Hearing is normal to rubbing fingers CN IX, X: Palate elevates symmetrically. Phonation is normal. CN XI: Head turning and shoulder shrug are intact CN XII: Tongue is midline with normal movements and no atrophy.  MOTOR: There is no pronator drift of out-stretched arms. Muscle bulk and tone are normal. Muscle strength is normal.   Shoulder abduction Shoulder external rotation Elbow flexion Elbow extension Wrist flexion Wrist extension Finger abduction Hip flexion Knee flexion Knee extension Ankle dorsi flexion Ankle plantar flexion  R 5 5 5 5 5 5 5 5 5 5 5 5   L 5 5 5 5 5 5 5 5 5 5 5 5     REFLEXES: Reflexes are 2+ and symmetric at the biceps, triceps, knees, and ankles. Plantar responses are flexor.  SENSORY: Light touch, pinprick, position sense, and vibration sense are intact in fingers and toes.  COORDINATION: Rapid alternating movements and fine finger movements are intact. There is no dysmetria on finger-to-nose and heel-knee-shin. There are no abnormal or extraneous movements.   GAIT/STANCE: Posture is normal. Cautious, mildly unsteady, wide-based Romberg is absent.    DIAGNOSTIC DATA (LABS, IMAGING, TESTING) - I reviewed patient records, labs, notes, testing and imaging myself where available.  Lab Results  Component Value Date   WBC 5.2 09/05/2014   HGB 11.4* 09/05/2014   HCT 33.3* 09/05/2014   MCV 92.1 09/05/2014   PLT 258.0 09/05/2014      Component Value Date/Time   NA 134* 09/05/2014 1724   K 3.8 09/05/2014 1724   CL 98 09/05/2014 1724   CO2 29 09/05/2014 1724   GLUCOSE 119* 09/05/2014 1724   BUN 17 09/05/2014 1724   CREATININE 1.30* 11/06/2014 0935    CALCIUM 9.9 09/05/2014 1724   PROT 7.7 09/05/2014 1724   ALBUMIN 4.0 09/05/2014 1724   AST 22 09/05/2014 1724   ALT 11 09/05/2014 1724   ALKPHOS 71 09/05/2014 1724   BILITOT 0.3 09/05/2014 1724   GFRNONAA 43* 06/23/2014 1214   GFRAA 50* 06/23/2014 1214   Lab Results  Component Value Date   CHOL 176 09/05/2014   HDL 66.50 09/05/2014   LDLCALC 92 09/05/2014   LDLDIRECT 170.4 12/05/2012   TRIG 90.0 09/05/2014   CHOLHDL 3 09/05/2014    Lab Results  Component Value Date   VITAMINB12 924* 10/02/2013   Lab Results  Component Value Date   TSH 1.15 09/05/2014    ASSESSMENT AND PLAN  80 y.o. year old female  has a past medical history of hypertension, hyperlipidemia, probable complex partial seizure, versus orthostatic syncope, mild memory trouble,  1. Probable complex partial seizure, recurrent episode despite titrating dose of Keppra, will change Keppra to 1500 mg twice a day, Vimpat to 100 mg twice a day 2. The other possibility will be orthostatic syncope, she is on for age and treatment for blood pressure, I have suggested her to continue lisinopril 40 mg to 1/2 Tablets twice a day, stop hydrochlorothiazide 25 mg every morning, amlodipine 5 mg every evening, decrease atenolol to 300 mg every morning monitoring blood pressure daily,  3 document all the event 4 Return to clinic in 2-3 months, if she continued to improve with modification of her blood pressure pill, may consider in tapering down her antiepileptic medications.     Terrilyn SaverYijun Raylyn Speckman  [facility] [address], [address] [place], [place] [id number] [phone]  

## 2014-11-22 DIAGNOSIS — R413 Other amnesia: Secondary | ICD-10-CM | POA: Diagnosis not present

## 2014-11-22 DIAGNOSIS — I10 Essential (primary) hypertension: Secondary | ICD-10-CM | POA: Diagnosis not present

## 2014-11-22 DIAGNOSIS — Z8541 Personal history of malignant neoplasm of cervix uteri: Secondary | ICD-10-CM | POA: Diagnosis not present

## 2014-11-22 DIAGNOSIS — G40209 Localization-related (focal) (partial) symptomatic epilepsy and epileptic syndromes with complex partial seizures, not intractable, without status epilepticus: Secondary | ICD-10-CM | POA: Diagnosis not present

## 2014-11-22 DIAGNOSIS — M6281 Muscle weakness (generalized): Secondary | ICD-10-CM | POA: Diagnosis not present

## 2014-11-22 DIAGNOSIS — Z9181 History of falling: Secondary | ICD-10-CM | POA: Diagnosis not present

## 2014-11-29 ENCOUNTER — Other Ambulatory Visit: Payer: Self-pay | Admitting: Internal Medicine

## 2014-12-11 DIAGNOSIS — E559 Vitamin D deficiency, unspecified: Secondary | ICD-10-CM | POA: Diagnosis not present

## 2015-01-15 ENCOUNTER — Other Ambulatory Visit: Payer: Self-pay | Admitting: Internal Medicine

## 2015-01-15 ENCOUNTER — Encounter: Payer: Self-pay | Admitting: Internal Medicine

## 2015-01-15 ENCOUNTER — Ambulatory Visit (INDEPENDENT_AMBULATORY_CARE_PROVIDER_SITE_OTHER): Payer: Commercial Managed Care - HMO | Admitting: Internal Medicine

## 2015-01-15 VITALS — BP 142/84 | HR 72 | Ht 62.5 in | Wt 161.0 lb

## 2015-01-15 DIAGNOSIS — K224 Dyskinesia of esophagus: Secondary | ICD-10-CM | POA: Insufficient documentation

## 2015-01-15 DIAGNOSIS — Z1211 Encounter for screening for malignant neoplasm of colon: Secondary | ICD-10-CM | POA: Diagnosis not present

## 2015-01-15 NOTE — Assessment & Plan Note (Signed)
She is well and w/o weight loss or dysphagia w/ diet modification. Does not desire having endoscopy - will continue as she is and see me prn. Explained rare but very unlikely occult cancer risk. Dysphagia 3 diet

## 2015-01-15 NOTE — Progress Notes (Signed)
   Subjective:    Patient ID: Teresa Riley, female    DOB: [birth year], 80 y.o.   MRN: [id number] Cc: f/u dysphagia HPI Here with daughter. Had seen Lori Hvozdovic PA_C - dysphagia sxs. PPI Rx and ba swallow - dysmotility and aortic impression on esophagus. Chest CT calcified aorta but no aneurysm. Has modified diet and is not having any dysphagia. Did not think PPI helped so stopped. Wt Readings from Last 3 Encounters:  01/15/15 161 lb (73.029 kg)  11/20/14 155 lb (70.308 kg)  10/14/14 153 lb (69.4 kg)  Medications, allergies, past medical history, past surgical history, family history and social history are reviewed and updated in the EMR.  Review of Systems As above    Objective:   Physical Exam BP 142/84 mmHg  Pulse 72  Ht 5' 2.5" (1.588 m)  Wt 161 lb (73.029 kg)  BMI 28.96 kg/m2 Elderly bw NAD     Assessment & Plan:  Esophageal dysmotility She is well and w/o weight loss or dysphagia w/ diet modification. Does not desire having endoscopy - will continue as she is and see me prn. Explained rare but very unlikely occult cancer risk. Dysphagia 3 diet  Colon cancer screening - declined She does not want to do stool tests or colonoscopy   15 minutes time spent with patient > half in counseling coordination of care  ZH:[id number]:Teresa [name], MD

## 2015-01-15 NOTE — Patient Instructions (Signed)
  Today we are giving you a Dysphagia diet to read and follow level #3.    Follow up with Dr. Leone PayorGessner as needed.    I appreciate the opportunity to care for you. Stan Headarl Gessner, MD, [facility]

## 2015-01-15 NOTE — Assessment & Plan Note (Signed)
She does not want to do stool tests or colonoscopy

## 2015-01-17 ENCOUNTER — Other Ambulatory Visit: Payer: Self-pay | Admitting: Internal Medicine

## 2015-01-20 ENCOUNTER — Telehealth: Payer: Self-pay | Admitting: Internal Medicine

## 2015-01-20 MED ORDER — FLUOCINONIDE 0.05 % EX CREA: TOPICAL_CREAM | Freq: Two times a day (BID) | CUTANEOUS | Status: DC

## 2015-01-20 NOTE — Telephone Encounter (Signed)
Please advise pt that Lisinopril is managed by her Doctor, Dr Terrace ArabiaYan.  Cream Rx requires appt because is expired 1+ year ago

## 2015-01-20 NOTE — Telephone Encounter (Signed)
Daughter advised Rx sent in to the pharmacy

## 2015-01-20 NOTE — Telephone Encounter (Signed)
States has seen Dr. [name] not long ago.  States has talked to Dr. [name] about eczema flare ups.  Would like Dr. [name] to be asked when he gets back in if he can prescribe something without her coming in.

## 2015-01-20 NOTE — Telephone Encounter (Signed)
Is requesting refill on lisinopril 20mg  tabs and fluocinonide cream to be [facility] [address] [facility] [address]

## 2015-02-25 ENCOUNTER — Ambulatory Visit (INDEPENDENT_AMBULATORY_CARE_PROVIDER_SITE_OTHER): Payer: Commercial Managed Care - HMO | Admitting: Neurology

## 2015-02-25 ENCOUNTER — Encounter: Payer: Self-pay | Admitting: Neurology

## 2015-02-25 VITALS — BP 161/92 | HR 74 | Ht 62.5 in | Wt 159.0 lb

## 2015-02-25 DIAGNOSIS — I10 Essential (primary) hypertension: Secondary | ICD-10-CM

## 2015-02-25 DIAGNOSIS — R55 Syncope and collapse: Secondary | ICD-10-CM

## 2015-02-25 DIAGNOSIS — R413 Other amnesia: Secondary | ICD-10-CM

## 2015-02-25 MED ORDER — LISINOPRIL 20 MG PO TABS: 20.0000 mg | ORAL_TABLET | Freq: Two times a day (BID) | ORAL | Status: DC

## 2015-02-25 MED ORDER — LEVETIRACETAM 1000 MG PO TABS: 1000.0000 mg | ORAL_TABLET | Freq: Two times a day (BID) | ORAL | Status: DC

## 2015-02-25 NOTE — Progress Notes (Signed)
Chief Complaint  Patient presents with  . Dizziness    She is here with her daughter, Teresa Riley.  Feels she is doing much better with the changes made to her medications.  She has not had any episodes of syncope or dizziness.  . Memory Loss    MMSE 28/30 - 15 animals.  Feels her short-term memory is getting worse.   Chief Complaint  Patient presents with  . Dizziness    She is here with her daughter, Teresa Riley.  Feels she is doing much better with the changes made to her medications.  She has not had any episodes of syncope or dizziness.  . Memory Loss    MMSE 28/30 - 15 animals.  Feels her short-term memory is getting worse.     [facility]  PATIENT: Teresa Riley DOB: May 09, 1935   HISTORY OF PRESENT ILLNESS: Teresa Riley, 80 year old female returns for followup. She is now living with her daughter Teresa Riley.  She was referred by her primary care physician Dr. 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  MRI scan of the brain in 2014, shows extensive changes of chroniic microvascular ischemia and moderate degree of generalized cerebral atrophy and ventricular enlargement which appear slightly more advanced compared with previous MRI dated 06/03/2006.  She began to have episodes of seizure-like event since 2013, she felt overheated, then become confused, blank stares, then slump down, body jerking movement,   For a while, she continue have recurrent episode on titrating dose of Keppra up to 1500 mg twice a day, Vimpat 100 mg twice a day, EEG in March 2016 showed bifrontal delta slowing, with right hemispheric predominance. This is consistent with  FIRDA.   Upon further questioning, all episode happened in a standing position, she was on 4 agents treatment for her hypertension, lisinopril 40 mg in the morning, amlodipine 5 mg in the morning, hydrochlorothiazide 25 mg in the morning, atenolol 300 mg twice a day, all the episodes clustered in the morning time, shortly after she take her blood pressure medications.  I have decreased her blood pressure medication since May 2016, lisinopril 20 mg twice a day, amlodipine 5 mg every night, atenolol 300 mg every morning, stopped hydrochlorothiazide, and potassium supplement,   She no longer has recurrent passing out episode, seizure-like event, doing very well, continue has mild memory trouble,    REVIEW OF SYSTEMS: Full 14 system review of systems performed and notable only for those listed, all others are neg: As above, memory loss, confusion  ALLERGIES: No Known Allergies  HOME MEDICATIONS: Outpatient Prescriptions Prior to Visit  Medication Sig Dispense Refill  . albuterol (PROVENTIL HFA;VENTOLIN HFA) 108 (90 BASE) MCG/ACT inhaler Inhale 2 puffs into the lungs every 6 (six) hours as needed for wheezing or shortness of breath. 1 Inhaler 5  . ALPRAZolam (XANAX) 0.25 MG tablet Take 1 tablet (0.25 mg total) by mouth 2 (two) times daily as needed for anxiety. 60 tablet 5  . amLODipine (NORVASC) 5 MG tablet Take 1 tablet (5 mg total) by mouth daily. 90 tablet 3  . aspirin EC 81 MG tablet Take 81 mg by mouth daily with breakfast.    .  atorvastatin (LIPITOR) 10 MG tablet take 1 tablet by mouth once daily 90 tablet 3  . Calcium-Magnesium-Vitamin D (CALCIUM MAGNESIUM PO) Take 1 tablet by mouth daily.    . cycloSPORINE (RESTASIS) 0.05 % ophthalmic emulsion Place 1 drop into both eyes at bedtime.    . fluocinonide cream (LIDEX) 0.05 % Apply topically 2 (two) times daily. 30 g 1  . fluticasone (FLONASE) 50 MCG/ACT nasal spray instill 1 spray into each nostril once daily 16 g 2  . labetalol (NORMODYNE)  300 MG tablet Take 1 tablet (300 mg total) by mouth every morning.    . lacosamide 100 MG TABS Take 1 tablet (100 mg total) by mouth 2 (two) times daily. 60 tablet 5  . levETIRAcetam (KEPPRA) 1000 MG tablet 1.5 tabs ( ) po twice daily (in am and pm) 90 tablet 6  . lisinopril (PRINIVIL,ZESTRIL) 40 MG tablet Take 0.5 tablets (20 mg total) by mouth daily. 90 tablet 3  . loratadine (CLARITIN) 10 MG tablet Take 10 mg by mouth daily.    . Memantine HCl ER 28 MG CP24 Take 28 mg by mouth daily. 30 capsule 11  . Multiple Vitamin (MULTIVITAMIN WITH MINERALS) TABS tablet Take 1 tablet by mouth daily.    . Omega-3 Fatty Acids (OMEGA 3 PO) Take 1 capsule by mouth daily.    Bertram Gala Glycol-Propyl Glycol (SYSTANE OP) Place 2-4 drops into both eyes 4 (four) times daily as needed (for dry eyes).      No facility-administered medications prior to visit.    PAST MEDICAL HISTORY: Past Medical History  Diagnosis Date  . Hypertension   . HTN (hypertension) 10/29/2010  . H/O: hysterectomy 10/29/2010  . Chronic constipation 10/29/2010  . Eczema 10/29/2010  . History of cervical cancer 10/29/2010  . Allergic rhinitis, cause unspecified 10/29/2010  . Hyperlipidemia   . Orthostatic hypotension 06/05/2012  . Impaired glucose tolerance 08/16/2012  . Memory loss   . Seizures   . Vitamin D deficiency 09/05/2014  . Allergic rhinitis 09/05/2014  . Osteoporosis   . Right hip pain   . Gait difficulty   . Loss of appetite   . Vascular dementia     PAST SURGICAL HISTORY: Past Surgical History  Procedure Laterality Date  . Abdominal hysterectomy  1970  . Breast biopsy  1980's    benign  . Cataract extraction w/ intraocular lens  implant, bilateral  01/2011  . Eye surgery      FAMILY HISTORY: Family History  Problem Relation Age of Onset  . Hypertension Mother   . Hypertension Father   . High blood pressure Daughter     x2  . Colon cancer Neg Hx   . Colon polyps Neg Hx   . Kidney disease Neg Hx   .  Gallbladder disease Neg Hx   . Diabetes Father     SOCIAL HISTORY: Social History   Social History  . Marital Status: Divorced    Spouse Name: N/A  . Number of Children: 3  . Years of Education: 2   Occupational History  . retired Runner, broadcasting/film/video    Social History Main Topics  . Smoking status: Former Smoker    Types: Cigarettes    Quit date: 07/13/1971  . Smokeless tobacco: Never Used     Comment: quit in 1973  . Alcohol Use: No  . Drug Use: No  . Sexual Activity: No   Other Topics Concern  . Not on file   Social History Narrative   Patient lives  at home with two daughters Wadie Lessen) Anda Latina). Patient is retired. Patient has two years college.     PHYSICAL EXAM  Filed Vitals:   02/25/15 1349  BP: 161/92  Pulse: 74  Height: 5' 2.5" (1.588 m)  Weight: 159 lb (72.122 kg)   Body mass index is 28.6 kg/(m^2). PHYSICAL EXAMNIATION:  Gen: NAD, conversant, well nourised, obese, well groomed                     Cardiovascular: Regular rate rhythm, no peripheral edema, warm, nontender. Eyes: Conjunctivae clear without exudates or hemorrhage Neck: Supple, no carotid bruise. Pulmonary: Clear to auscultation bilaterally   NEUROLOGICAL EXAM:  MENTAL STATUS:Mini-Mental Status Examination 29 out of 30, animal naming 15  Speech:    Speech is normal; fluent and spontaneous with normal comprehension.  Cognition:     The patient is oriented to person, place, and time;     recent and remote memory intact; She missed one out of 3 recalls    language fluent;     normal attention, concentration,     fund of knowledge.  CRANIAL NERVES: CN II: Visual fields are full to confrontation.  Pupils were equal round reactive to light. CN III, IV, VI: extraocular movement are normal. No ptosis. CN V: Facial sensation is intact to pinprick in all 3 divisions bilaterally. Corneal responses are intact.  CN VII: Face is symmetric with normal eye closure and smile. CN VIII: Hearing  is normal to rubbing fingers CN IX, X: Palate elevates symmetrically. Phonation is normal. CN XI: Head turning and shoulder shrug are intact CN XII: Tongue is midline with normal movements and no atrophy.  MOTOR: There is no pronator drift of out-stretched arms. Muscle bulk and tone are normal. Muscle strength is normal.   REFLEXES: Reflexes are 2+ and symmetric at the biceps, triceps, knees, and ankles. Plantar responses are flexor.  SENSORY: Light touch, pinprick, position sense, and vibration sense are intact in fingers and toes.  COORDINATION: Rapid alternating movements and fine finger movements are intact. There is no dysmetria on finger-to-nose and heel-knee-shin.   GAIT/STANCE: Posture is normal. Cautious, mildly unsteady, wide-based, Difficulty with tandem walking  Romberg is absent.    DIAGNOSTIC DATA (LABS, IMAGING, TESTING) - I reviewed patient records, labs, notes, testing and imaging myself where available.    ASSESSMENT AND PLAN  80 y.o. year old female  with past medical history of hypertension, hyperlipidemia,  Recurrent passing out episode  Most consistent with orthostatic blood pressure changes, which has much improved after changing her blood pressure medications,   New prescription lisinopril 20 mg tablets one tablets twice a day   Stopping Vimpat decreased Keppra to 1000 mg twice a day, Document all event, if she continued to to be event free, may consider stop keppra at her next visit in 3 months   Mild cognitive impairment   Mini-Mental Status Examination 29 out of 30 today,   Evidence of brain atrophy   Continue Namenda XR 28 mg daily  [facility] [address], [address] [place], [place] [address] [phone]

## 2015-02-28 ENCOUNTER — Telehealth: Payer: Self-pay | Admitting: Internal Medicine

## 2015-02-28 ENCOUNTER — Telehealth: Payer: Self-pay | Admitting: Neurology

## 2015-02-28 NOTE — Telephone Encounter (Signed)
Patient's daughter, [name], called the after hours call service, re: patient feels weak and at times confused after the recent changes in her medications. I reviewed Dr. Zannie Cove last note from 3 days ago. Vimpat was discontinued and Keppra was reduced. It does not look like this patient has a history of epilepsy. Nevertheless, I inquired regarding her symptoms. The patient was audible in the background. She sounded coherent and was able to answer questions that the daughter asked her. The patient reported that she felt "dry from the inside". She was drinking enough water but had not had much to eat all day. She had one ensure and peanut butter crackers but was in the process of eating something as we talked on the phone. We mutually agreed to watch her symptoms some more. Blood pressure this morning was 140s over 80s. This was according to the daughter. Her daughter felt it was okay to watch her for symptom resolution. In the interim, I have asked her to push oral fluid intake and good nutrition. I don't think we have enough reason to change her medications at this time. Daughter was in agreement. I promised her I would keep Dr. Terrace Arabia updated.

## 2015-02-28 NOTE — Telephone Encounter (Signed)
[facility] - Client TELEPHONE ADVICE RECORD [facility] Patient Name: Teresa Riley DOB: 27-Mar-1935 Initial Comment Caller states that her mother feels as if her body is drying, tightness of skin Nurse Assessment Nurse: Ladona Ridgel, RN, Felicia Date/Time (Eastern Time): 02/28/2015 3:29:45 PM Confirm and document reason for call. If symptomatic, describe symptoms. ---PT feels dry - she has been drinking a lot of water but her hands look dry. Drank 40 oz of water today. No fever and no other symptoms. Weak and a little confused earlier - urinated in the last hour. Not confused now. Has the patient traveled out of the country within the last 30 days? ---No Does the patient require triage? ---Yes Related visit to physician within the last 2 weeks? ---NoDoes the PT have any chronic conditions? (i.e. diabetes, asthma, etc.) ---No Guidelines Guideline Title Affirmed Question Affirmed Notes Weakness (Generalized) and Fatigue [1] MODERATE weakness (i.e., interferes with work, school, normal activities) AND [2] cause unknown (Exceptions: weakness with acute minor illness, or weakness from poor fluid intake) Final Disposition User See Physician within 4 Hours (or PCP triage) Ladona Ridgel, RN, Felicia Comments urinated 3-4 x today - normal urinations. Tongue is not dry now butit gets that way. H/o epilepsy, chronic dry eye, HTN no pain or blood in urine now she says no dry mouth today Referrals [facility] Disagree/Comply: Comply Call Id: [id number]

## 2015-03-03 ENCOUNTER — Telehealth: Payer: Self-pay | Admitting: Neurology

## 2015-03-03 NOTE — Telephone Encounter (Signed)
See phone note by Dr. Frances Furbish in August 19th, please check on her to make sure that she is ok now

## 2015-03-03 NOTE — Telephone Encounter (Signed)
Spoke to [place] ([employer] on [employer]) - says her mother is feeling much better - she will call us with any further concerns.

## 2015-03-04 ENCOUNTER — Telehealth: Payer: Self-pay | Admitting: *Deleted

## 2015-03-04 NOTE — Telephone Encounter (Signed)
[facility] - Client TELEPHONE ADVICE RECORD [facility] Patient Name: Teresa Riley Gender: Female DOB: Mar 21, 1935 Age: 80 Y 1 M 30 D Return Phone Number: [phone] (Primary) Address: City/State/Zip: [place] [address] Client [facility] - Client Client Site [facility] Day Physician Oliver Barre Contact Type Call Call Type Triage / Clinical Caller Name Anette Riedel Relationship To Patient Daughter Appointment Disposition EMR Appointment Attempted - Not Scheduled Info pasted into Epic Yes Return Phone Number [phone] (Primary) Chief Complaint Dehydration (>1 YEAR) Initial Comment Caller states that her mother feels as if her body is drying, tightness of skin PreDisposition 911 Nurse Assessment Nurse: Ladona Ridgel, RN, Felicia Date/Time (Eastern Time): 02/28/2015 3:29:45 PM Confirm and document reason for call. If symptomatic, describe symptoms. ---PT feels dry - she has been drinking a lot of water but her hands look dry. Drank 40 oz of water today. No fever and no other symptoms. Weak and a little confused earlier - urinated in the last hour. Not confused now. Has the patient traveled out of the country within the last 30 days? ---No Does the patient require triage? ---Yes Related visit to physician within the last 2 weeks? ---No Does the PT have any chronic conditions? (i.e. diabetes, asthma, etc.) ---No Guidelines Guideline Title Affirmed Question Affirmed Notes Nurse Date/Time Lamount Cohen Time) Weakness (Generalized) and Fatigue [1] MODERATE weakness (i.e., interferes with work, school, normal activities) AND [2] cause unknown (Exceptions: weakness with acute minor illness, or weakness from poor fluid intake) Gaddy, RN, Felicia 02/28/2015 3:38:01 PM Disp. Time Lamount Cohen Time) Disposition Final User 02/28/2015 3:49:39 PM Call Completed Ladona Ridgel RN, Sunny Schlein 02/28/2015 3:55:16 PM Call Completed  Ladona Ridgel, RN, Sunny Schlein PLEASE NOTE: All timestamps contained within this report are represented as [place] Standard Time. CONFIDENTIALTY NOTICE: This fax transmission is intended only for the addressee. It contains information that is legally privileged, confidential or otherwise protected from use or disclosure. If you are not the intended recipient, you are strictly prohibited from reviewing, disclosing, copying using or disseminating any of this information or taking any action in reliance on or regarding this information. If you have received this fax in error, please notify us immediately by telephone so that we can arrange for its return to [place]. Phone: [phone], Toll-Free: [phone], Fax: [phone] Page: 2 of 2 Call Id: [id number] Disp. Time Lamount Cohen Time) Disposition Final User 02/28/2015 3:55:35 PM Call Completed Ladona Ridgel RN, Sunny Schlein 02/28/2015 3:43:35 PM See Physician within 4 Hours (or PCP triage) Yes Ladona Ridgel, RN, Lavena Stanford Understands: Yes Disagree/Comply: Comply Care Advice Given Per Guideline SEE PHYSICIAN WITHIN 4 HOURS (or PCP triage): * Please bring a list of your current medicines when you go to see the doctor. * You become worse. * It is also a good idea to bring the pill bottles too. This will help the doctor to make certain you are taking the right medicines and the right dose. After Care Instructions Given Call Event Type User Date / Time Description Comments User: Hampton Abbot, RN Date/Time Lamount Cohen Time): 02/28/2015 3:35:56 PM urinated 3-4 x today - normal urinations. Tongue is not dry now but it gets that way. H/o epilepsy, chronic dry eye, HTN User: Hampton Abbot, RN Date/Time Lamount Cohen Time): 02/28/2015 3:36:13 PM no pain or blood in urine User: Hampton Abbot, RN Date/Time Lamount Cohen Time): 02/28/2015 3:37:34 PM now she says no dry mouth today Referrals [facility]

## 2015-03-05 ENCOUNTER — Other Ambulatory Visit: Payer: Self-pay | Admitting: Internal Medicine

## 2015-03-05 ENCOUNTER — Other Ambulatory Visit: Payer: Self-pay | Admitting: Neurology

## 2015-03-05 NOTE — Telephone Encounter (Signed)
Last OV note says: New prescription lisinopril 20 mg tablets one tablets twice a day

## 2015-03-12 ENCOUNTER — Other Ambulatory Visit (INDEPENDENT_AMBULATORY_CARE_PROVIDER_SITE_OTHER): Payer: Commercial Managed Care - HMO

## 2015-03-12 ENCOUNTER — Encounter: Payer: Self-pay | Admitting: Internal Medicine

## 2015-03-12 ENCOUNTER — Other Ambulatory Visit: Payer: Self-pay | Admitting: Internal Medicine

## 2015-03-12 ENCOUNTER — Ambulatory Visit (INDEPENDENT_AMBULATORY_CARE_PROVIDER_SITE_OTHER): Payer: Commercial Managed Care - HMO | Admitting: Internal Medicine

## 2015-03-12 VITALS — BP 136/84 | HR 71 | Temp 97.9°F | Ht 63.0 in | Wt 160.0 lb

## 2015-03-12 DIAGNOSIS — M810 Age-related osteoporosis without current pathological fracture: Secondary | ICD-10-CM

## 2015-03-12 DIAGNOSIS — Z0189 Encounter for other specified special examinations: Secondary | ICD-10-CM

## 2015-03-12 DIAGNOSIS — R21 Rash and other nonspecific skin eruption: Secondary | ICD-10-CM

## 2015-03-12 DIAGNOSIS — N289 Disorder of kidney and ureter, unspecified: Secondary | ICD-10-CM

## 2015-03-12 DIAGNOSIS — L853 Xerosis cutis: Secondary | ICD-10-CM | POA: Diagnosis not present

## 2015-03-12 DIAGNOSIS — Z0000 Encounter for general adult medical examination without abnormal findings: Secondary | ICD-10-CM

## 2015-03-12 LAB — BASIC METABOLIC PANEL
BUN: 10 mg/dL (ref 6–23)
CHLORIDE: 105 meq/L (ref 96–112)
CO2: 28 meq/L (ref 19–32)
Calcium: 9.9 mg/dL (ref 8.4–10.5)
Creatinine, Ser: 1.04 mg/dL (ref 0.40–1.20)
GFR: 65.88 mL/min (ref >60.00)
GLUCOSE: 95 mg/dL (ref 70–99)
POTASSIUM: 4.4 meq/L (ref 3.5–5.1)
SODIUM: 138 meq/L (ref 135–145)

## 2015-03-12 MED ORDER — ALENDRONATE SODIUM 70 MG PO TABS: 70.0000 mg | ORAL_TABLET | ORAL | Status: DC

## 2015-03-12 MED ORDER — METHYLPREDNISOLONE ACETATE 80 MG/ML IJ SUSP
80.0000 mg | Freq: Once | INTRAMUSCULAR | Status: AC
  Administered 2015-03-12: 80 mg via INTRAMUSCULAR

## 2015-03-12 MED ORDER — PREDNISONE 20 MG PO TABS: 20.0000 mg | ORAL_TABLET | Freq: Every day | ORAL | Status: DC

## 2015-03-12 NOTE — Assessment & Plan Note (Signed)
Mild, for f/u labs today, though pt does not seem dehydrated today as family fears

## 2015-03-12 NOTE — Patient Instructions (Addendum)
You had the steroid shot today  Please take all new medication as prescribed - the prednisone  We will ask Colin Mulders to look into the copay for Prolia  Please continue all other medications as before, and refills have been done if requested.  Please have the pharmacy call with any other refills you may need.  Please keep your appointments with your specialists as you may have planned  Please go to the LAB in the Basement (turn left off the elevator) for the tests to be done today  You will be contacted by phone if any changes need to be made immediately.  Otherwise, you will receive a letter about your results with an explanation, but please check with MyChart first.  Please return in 6 months, or sooner if needed, with Lab testing done 3-5 days before

## 2015-03-12 NOTE — Assessment & Plan Note (Signed)
Mild to arms, for depomedrol IM, prednisone x 5 days,  to f/u any worsening symptoms or concerns

## 2015-03-12 NOTE — Addendum Note (Signed)
Addended by: Anselm Jungling on: 03/12/2015 03:31 PM   Modules accepted: Orders

## 2015-03-12 NOTE — Progress Notes (Signed)
Pre visit review using our clinic review tool, if applicable. No additional management support is needed unless otherwise documented below in the visit note. 

## 2015-03-12 NOTE — Progress Notes (Signed)
Subjective:    Patient ID: CINDRA AUSTAD, female    DOB: March 08, 1935, 80 y.o.   MRN: [id number]  HPI  Here to f/u with dryness of skin and tightness "all over" worse to the feet, some itching and rash noted to arms, may be scratching at night, no fever or skin pain/tender/red streaks.  Pt denies chest pain, increased sob or doe, wheezing, orthopnea, PND, increased LE swelling, palpitations, dizziness or syncope.  Pt denies new neurological symptoms such as new headache, or facial or extremity weakness or numbness  Family concerned not drinking enough, pt did have mild renal insuff last visit.  Family also concerned about [employer]is Bone Density feb 2016 - would like to try Prolia if possible with her insurance (o/w fosamax) Past Medical History  Diagnosis Date  . Hypertension   . HTN (hypertension) 10/29/2010  . H/O: hysterectomy 10/29/2010  . Chronic constipation 10/29/2010  . Eczema 10/29/2010  . History of cervical cancer 10/29/2010  . Allergic rhinitis, cause unspecified 10/29/2010  . Hyperlipidemia   . Orthostatic hypotension 06/05/2012  . Impaired glucose tolerance 08/16/2012  . Memory loss   . Seizures   . Vitamin D deficiency 09/05/2014  . Allergic rhinitis 09/05/2014  . Osteoporosis   . Right hip pain   . Gait difficulty   . Loss of appetite   . Vascular dementia    Past Surgical History  Procedure Laterality Date  . Abdominal hysterectomy  1970  . Breast biopsy  1980's    benign  . Cataract extraction w/ intraocular lens  implant, bilateral  01/2011  . Eye surgery      reports that she quit smoking about 43 years ago. Her smoking use included Cigarettes. She has never used smokeless tobacco. She reports that she does not drink alcohol or use illicit drugs. family history includes Diabetes in her father; High blood pressure in her daughter; Hypertension in her father and mother. There is no history of Colon cancer, Colon polyps, Kidney disease, or Gallbladder disease. No Known  Allergies Current Outpatient Prescriptions on File Prior to Visit  Medication Sig Dispense Refill  . albuterol (PROVENTIL HFA;VENTOLIN HFA) 108 (90 BASE) MCG/ACT inhaler Inhale 2 puffs into the lungs every 6 (six) hours as needed for wheezing or shortness of breath. 1 Inhaler 5  . ALPRAZolam (XANAX) 0.25 MG tablet Take 1 tablet (0.25 mg total) by mouth 2 (two) times daily as needed for anxiety. 60 tablet 5  . amLODipine (NORVASC) 5 MG tablet Take 1 tablet (5 mg total) by mouth daily. 90 tablet 3  . aspirin EC 81 MG tablet Take 81 mg by mouth daily with breakfast.    . atorvastatin (LIPITOR) 10 MG tablet take 1 tablet by mouth once daily 90 tablet 3  . Calcium-Magnesium-Vitamin D (CALCIUM MAGNESIUM PO) Take 1 tablet by mouth daily.    . cycloSPORINE (RESTASIS) 0.05 % ophthalmic emulsion Place 1 drop into both eyes at bedtime.    . fluocinonide cream (LIDEX) 0.05 % Apply topically 2 (two) times daily. 30 g 1  . fluticasone (FLONASE) 50 MCG/ACT nasal spray instill 1 spray into each nostril once daily 16 g 2  . labetalol (NORMODYNE) 300 MG tablet Take 1 tablet (300 mg total) by mouth every morning.    . levETIRAcetam (KEPPRA) 1000 MG tablet Take 1 tablet (1,000 mg total) by mouth 2 (two) times daily. 1.5 tabs (1500mg ) po twice daily (in am and pm) 90 tablet 6  . lisinopril (PRINIVIL,ZESTRIL) 20 MG tablet  Take 1 tablet (20 mg total) by mouth 2 (two) times daily. 60 tablet 11  . loratadine (CLARITIN) 10 MG tablet Take 10 mg by mouth daily.    . Memantine HCl ER 28 MG CP24 Take 28 mg by mouth daily. 30 capsule 11  . Multiple Vitamin (MULTIVITAMIN WITH MINERALS) TABS tablet Take 1 tablet by mouth daily.    . Omega-3 Fatty Acids (OMEGA 3 PO) Take 1 capsule by mouth daily.    Bertram Gala Glycol-Propyl Glycol (SYSTANE OP) Place 2-4 drops into both eyes 4 (four) times daily as needed (for dry eyes).      No current facility-administered medications on file prior to visit.   Review of Systems   Constitutional: Negative for unusual diaphoresis or night sweats HENT: Negative for ringing in ear or discharge Eyes: Negative for double vision or worsening visual disturbance.  Respiratory: Negative for choking and stridor.   Gastrointestinal: Negative for vomiting or other signifcant bowel change Genitourinary: Negative for hematuria or change in urine volume.  Musculoskeletal: Negative for other MSK pain or swelling Skin: Negative for color change and worsening wound.  Neurological: Negative for tremors and numbness other than noted  Psychiatric/Behavioral: Negative for decreased concentration or agitation other than above       Objective:   Physical Exam BP 136/84 mmHg  Pulse 71  Temp(Src) 97.9 F (36.6 C) (Oral)  Ht  (1.6 m)  Wt 160 lb (72.576 kg)  BMI 28.35 kg/m2  SpO2 95% VS noted,  Constitutional: Pt appears in no significant distress HENT: Head: NCAT.  Right Ear: External ear normal.  Left Ear: External ear normal.  Eyes: . Pupils are equal, round, and reactive to light. Conjunctivae and EOM are normal Neck: Normal range of motion. Neck supple.  Cardiovascular: Normal rate and regular rhythm.   Pulmonary/Chest: Effort normal and breath sounds without rales or wheezing.  Abd:  Soft, NT, ND, + BS Neurological: Pt is alert. Not confused , motor grossly intact Skin: Skin is warm. Small erythema  Rash bilat arms, no LE edema, has dryness noted throughout Psychiatric: Pt behavior is normal. No agitation.     Assessment & Plan:

## 2015-03-12 NOTE — Assessment & Plan Note (Signed)
Mild to mod, lowest t score -2.9 in feb 2016, for prolia if able to approve by insurance,  to f/u any worsening symptoms or concerns

## 2015-03-12 NOTE — Assessment & Plan Note (Signed)
Ok to cont skin moisturizing,  to f/u any worsening symptoms or concerns

## 2015-03-13 ENCOUNTER — Telehealth: Payer: Self-pay

## 2015-03-13 NOTE — Telephone Encounter (Signed)
-----   Message from Corwin Levins, MD sent at 03/12/2015  6:05 PM EDT ----- Regarding: FW: ? copay for prolia  OK, we'll hold on the prolia for now, and try fosamax  Demarious Kapur to let family know that medicaid requires fosamax before will consider prolia; I will do rx thanks ----- Message -----    From: Randal Buba    Sent: 03/12/2015   3:13 PM      To: Corwin Levins, MD Subject: RE: ? copay for prolia                         Hi Dr. 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  In the chart all I could find is that Ms. General tried one other option (Actonel/risedronate sodium). Is there anything else she has tried that I possibly missed?  Medicaid will not even consider an authorization unless there is one other option.  Thank you! Rose  ----- Message -----    From: Corwin Levins, MD    Sent: 03/12/2015   2:38 PM      To: Randal Buba Subject: ? copay for prolia                             Please look into prolia copay, pt has dementia so please let family know (has 2 daughters) Thanks Dr Jonny Ruiz

## 2015-03-13 NOTE — Telephone Encounter (Signed)
Pt advised in detail via personal VM 

## 2015-03-28 NOTE — Telephone Encounter (Signed)
Teresa Riley [phone] Daughter called and she said that she did not rec vm.  Can you call her about this inj when you get a chance?

## 2015-03-31 NOTE — Telephone Encounter (Signed)
Spoke to [place]:   Does the fosamax cause any renal issues?

## 2015-03-31 NOTE — Telephone Encounter (Signed)
LVM for Teresa Riley to call back as soon as possible.    RE: prolia/fosamax question.

## 2015-03-31 NOTE — Telephone Encounter (Signed)
I have never seen a renal problem with the fosamax over 20 yrs.  Should be ok to take.

## 2015-03-31 NOTE — Telephone Encounter (Signed)
[name] has been informed of MD response below.

## 2015-04-08 ENCOUNTER — Other Ambulatory Visit: Payer: Self-pay | Admitting: Neurology

## 2015-04-20 ENCOUNTER — Other Ambulatory Visit: Payer: Self-pay | Admitting: Neurology

## 2015-05-07 NOTE — Telephone Encounter (Signed)
Error

## 2015-05-18 ENCOUNTER — Other Ambulatory Visit: Payer: Self-pay | Admitting: Internal Medicine

## 2015-05-26 ENCOUNTER — Telehealth: Payer: Self-pay | Admitting: Internal Medicine

## 2015-05-26 NOTE — Telephone Encounter (Signed)
Pt's daughter states the new medication for pts osteoporosis (she didn't know the name) is making her throat dry and irritating her esophagus.  She is wanting to know if she can go ahead and get the shot Please advise.

## 2015-05-27 ENCOUNTER — Ambulatory Visit (INDEPENDENT_AMBULATORY_CARE_PROVIDER_SITE_OTHER): Payer: Commercial Managed Care - HMO

## 2015-05-27 ENCOUNTER — Ambulatory Visit (INDEPENDENT_AMBULATORY_CARE_PROVIDER_SITE_OTHER): Payer: Commercial Managed Care - HMO | Admitting: Neurology

## 2015-05-27 ENCOUNTER — Telehealth: Payer: Self-pay

## 2015-05-27 ENCOUNTER — Encounter: Payer: Self-pay | Admitting: Neurology

## 2015-05-27 VITALS — BP 175/94 | HR 67 | Ht 63.0 in | Wt 162.0 lb

## 2015-05-27 DIAGNOSIS — H04123 Dry eye syndrome of bilateral lacrimal glands: Secondary | ICD-10-CM

## 2015-05-27 DIAGNOSIS — R55 Syncope and collapse: Secondary | ICD-10-CM

## 2015-05-27 DIAGNOSIS — Z23 Encounter for immunization: Secondary | ICD-10-CM

## 2015-05-27 DIAGNOSIS — G3184 Mild cognitive impairment, so stated: Secondary | ICD-10-CM | POA: Diagnosis not present

## 2015-05-27 MED ORDER — LEVETIRACETAM 250 MG PO TABS: 500.0000 mg | ORAL_TABLET | Freq: Two times a day (BID) | ORAL | Status: DC

## 2015-05-27 NOTE — Telephone Encounter (Signed)
Ok to d/c the fosamax  Dahlia to inform pt, and adjust med list  Ms Teresa Riley, can we look into the Copay situation for this patient for Prolia? thanks

## 2015-05-27 NOTE — Progress Notes (Signed)
Chief Complaint  Patient presents with  . Dizziness    She is here with her daughter, [name].  Says she is doing well on Keppra  twice daily.  She has not had any further events.  Says [name] has recently been diagnosed with Lupus.  . Memory Loss    MMSE 29/30 - 22 animals. Feels memory is stable.     [facility]  PATIENT: Teresa Riley DOB: Jan 06, 1935   HISTORY OF PRESENT ILLNESS: Teresa Riley, 80 year old female returns for followup. She is now living with her daughter Teresa Riley.  She was referred by her primary care physician Dr. 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  MRI scan of the brain in 2014, shows extensive changes of chroniic microvascular ischemia and moderate degree of generalized cerebral atrophy and ventricular enlargement which appear slightly more advanced compared with previous MRI dated 06/03/2006.  She began to have episodes of seizure-like event since 2013, she felt overheated, then become confused, blank stares, then slump down, body jerking movement,   For a while, she continue have recurrent episode on titrating dose of Keppra up to 1500 mg twice a day, Vimpat 100 mg twice a day, EEG in March 2016 showed bifrontal delta slowing, with right hemispheric predominance. This is consistent with FIRDA.   Upon further questioning, all episode happened in a standing position, she was on 4 agents treatment for her hypertension, lisinopril 40 mg in the morning, amlodipine 5 mg in the morning, hydrochlorothiazide 25 mg in the morning, atenolol 300 mg twice a day, all the episodes clustered in the morning time, shortly  after she take her blood pressure medications.  I have decreased her blood pressure medication since May 2016, lisinopril 20 mg twice a day, amlodipine 5 mg every night, atenolol 300 mg every morning, stopped hydrochlorothiazide, and potassium supplement,   She no longer has recurrent passing out episode, seizure-like event, doing very well, continue has mild memory trouble,   UPDATE May 27 2015: She is now taking Keppra 1000 mg twice a day, there was no recurrent seizure like activity, she is overall doing well, she continue have mild unsteady gait, right hip pain, ambulate with a cane, I will continue to decrease her Keppra to 500 mg twice a day,   REVIEW OF SYSTEMS: Full 14 system review of systems performed and notable only for those listed, all others are neg: As above, memory loss, confusion  ALLERGIES: No Known Allergies  HOME MEDICATIONS: Outpatient Prescriptions Prior to Visit  Medication Sig Dispense Refill  . albuterol (PROVENTIL HFA;VENTOLIN HFA) 108 (90 BASE) MCG/ACT inhaler Inhale 2 puffs into the lungs every 6 (six) hours as needed for wheezing or shortness of breath. 1 Inhaler 5  . alendronate (FOSAMAX) 70 MG tablet Take 1 tablet (70 mg total) by mouth every 7 (seven) days. Take with a full glass of water on an empty stomach. 4 tablet 11  . ALPRAZolam (XANAX) 0.25 MG tablet Take 1 tablet (0.25 mg total) by mouth 2 (two) times daily as needed for anxiety. 60 tablet 5  . amLODipine (NORVASC) 5 MG tablet TAKE 1 TABLET BY MOUTH DAILY 90 tablet 3  . aspirin  EC 81 MG tablet Take 81 mg by mouth daily with breakfast.    . atorvastatin (LIPITOR) 10 MG tablet take 1 tablet by mouth once daily 90 tablet 3  . Calcium-Magnesium-Vitamin D (CALCIUM MAGNESIUM PO) Take 1 tablet by mouth daily.    . cycloSPORINE (RESTASIS) 0.05 % ophthalmic emulsion Place 1 drop into both eyes at bedtime.    . fluocinonide cream (LIDEX) 0.05 % Apply topically 2 (two) times daily. 30 g 1  . fluticasone  (FLONASE) 50 MCG/ACT nasal spray instill 1 spray into each nostril once daily 16 g 2  . labetalol (NORMODYNE) 300 MG tablet Take 1 tablet (300 mg total) by mouth every morning.    . levETIRAcetam (KEPPRA) 1000 MG tablet Take 1 tablet (1,000 mg total) by mouth 2 (two) times daily. 1.5 tabs (1500mg ) po twice daily (in am and pm) 90 tablet 6  . lisinopril (PRINIVIL,ZESTRIL) 20 MG tablet Take 1 tablet (20 mg total) by mouth 2 (two) times daily. 60 tablet 11  . loratadine (CLARITIN) 10 MG tablet Take 10 mg by mouth daily.    . Multiple Vitamin (MULTIVITAMIN WITH MINERALS) TABS tablet Take 1 tablet by mouth daily.    Marland Kitchen NAMENDA XR 28 MG CP24 24 hr capsule take 1 capsule by mouth daily 30 capsule 11  . NAMENDA XR 28 MG CP24 24 hr capsule take 1 capsule by mouth daily 30 capsule 11  . Omega-3 Fatty Acids (OMEGA 3 PO) Take 1 capsule by mouth daily.    Bertram Gala Glycol-Propyl Glycol (SYSTANE OP) Place 2-4 drops into both eyes 4 (four) times daily as needed (for dry eyes).     . predniSONE (DELTASONE) 20 MG tablet Take 1 tablet (20 mg total) by mouth daily with breakfast. 5 tablet 0   No facility-administered medications prior to visit.    PAST MEDICAL HISTORY: Past Medical History  Diagnosis Date  . Hypertension   . HTN (hypertension) 10/29/2010  . H/O: hysterectomy 10/29/2010  . Chronic constipation 10/29/2010  . Eczema 10/29/2010  . History of cervical cancer 10/29/2010  . Allergic rhinitis, cause unspecified 10/29/2010  . Hyperlipidemia   . Orthostatic hypotension 06/05/2012  . Impaired glucose tolerance 08/16/2012  . Memory loss   . Seizures (HCC)   . Vitamin D deficiency 09/05/2014  . Allergic rhinitis 09/05/2014  . Osteoporosis   . Right hip pain   . Gait difficulty   . Loss of appetite   . Vascular dementia     PAST SURGICAL HISTORY: Past Surgical History  Procedure Laterality Date  . Abdominal hysterectomy  1970  . Breast biopsy  1980's    benign  . Cataract extraction w/  intraocular lens  implant, bilateral  01/2011  . Eye surgery      FAMILY HISTORY: Family History  Problem Relation Age of Onset  . Hypertension Mother   . Hypertension Father   . High blood pressure Daughter     x2  . Colon cancer Neg Hx   . Colon polyps Neg Hx   . Kidney disease Neg Hx   . Gallbladder disease Neg Hx   . Diabetes Father     SOCIAL HISTORY: Social History   Social History  . Marital Status: Divorced    Spouse Name: N/A  . Number of Children: 3  . Years of Education: 2   Occupational History  . retired Runner, broadcasting/film/video    Social History Main Topics  . Smoking status: Former Smoker    Types: Cigarettes  Quit date: 07/13/1971  . Smokeless tobacco: Never Used     Comment: quit in 1973  . Alcohol Use: No  . Drug Use: No  . Sexual Activity: No   Other Topics Concern  . Not on file   Social History Narrative   Patient lives at home with two daughters Wadie Lessen(Angela Moore) Anda Latina(Freda Mitchell). Patient is retired. Patient has two years college.     PHYSICAL EXAM  Filed Vitals:   05/27/15 1212  BP: 175/94  Pulse: 67  Height: 5\' 3"  (1.6 m)  Weight: 162 lb (73.483 kg)   Body mass index is 28.7 kg/(m^2). PHYSICAL EXAMNIATION:  Gen: NAD, conversant, well nourised, obese, well groomed                     Cardiovascular: Regular rate rhythm, no peripheral edema, warm, nontender. Eyes: Conjunctivae clear without exudates or hemorrhage Neck: Supple, no carotid bruise. Pulmonary: Clear to auscultation bilaterally   NEUROLOGICAL EXAM:  MENTAL STATUS:Mini-Mental Status Examination 29 out of 30, animal naming 15  Speech:    Speech is normal; fluent and spontaneous with normal comprehension.  Cognition:     The patient is oriented to person, place, and time;     recent and remote memory intact; She missed one out of 3 recalls    language fluent;     normal attention, concentration,     fund of knowledge.  CRANIAL NERVES: CN II: Visual fields are full to  confrontation.  Pupils were equal round reactive to light. CN III, IV, VI: extraocular movement are normal. No ptosis. CN V: Facial sensation is intact to pinprick in all 3 divisions bilaterally. Corneal responses are intact.  CN VII: Face is symmetric with normal eye closure and smile. CN VIII: Hearing is normal to rubbing fingers CN IX, X: Palate elevates symmetrically. Phonation is normal. CN XI: Head turning and shoulder shrug are intact CN XII: Tongue is midline with normal movements and no atrophy.  MOTOR: There is no pronator drift of out-stretched arms. Muscle bulk and tone are normal. Muscle strength is normal.   REFLEXES: Reflexes are 2+ and symmetric at the biceps, triceps, knees, and ankles. Plantar responses are flexor.  SENSORY: Light touch, pinprick, position sense, and vibration sense are intact in fingers and toes.  COORDINATION: Rapid alternating movements and fine finger movements are intact. There is no dysmetria on finger-to-nose and heel-knee-shin.   GAIT/STANCE: Posture is normal. Cautious, mildly unsteady, wide-based, Difficulty with tandem walking  Romberg is absent.    DIAGNOSTIC DATA (LABS, IMAGING, TESTING) - I reviewed patient records, labs, notes, testing and imaging myself where available.x  ASSESSMENT AND PLAN  80 y.o. year old female  with past medical history of hypertension, hyperlipidemia,  Recurrent passing out episode  Most consistent with orthostatic blood pressure changes, which has much improved after changing her blood pressure medications,   New prescription lisinopril 20 mg tablets one tablets twice a day   She has stopped Vimpat   Will continue to decrease Keppra to 500 mg twice a day, if she has no recurrent seizure-like event, I will continue to decrease her Keppra to 250 mg twice a day  Mild cognitive impairment   Mini-Mental Status Examination 29 out of 30 today,   Evidence of brain atrophy   Continue Namenda XR 28 mg  daily  She complains of dry eyes, dry mouth, daughter with diagnosis of connective tissue disease, lupus   ANA SSA, SSB antibody to rule out  Sjogren's disease  Levert Feinstein, M.D. Ph.D. [facility] [address] [place], [place] [address] [phone]

## 2015-05-27 NOTE — Telephone Encounter (Signed)
Patient came in for nurse visit asking to receive prolia vaccine---note forwarded to rose brewer, rose, please advise, i will call patient back

## 2015-05-28 ENCOUNTER — Telehealth: Payer: Self-pay | Admitting: *Deleted

## 2015-05-28 LAB — ANA W/REFLEX IF POSITIVE: ANA: NEGATIVE

## 2015-05-28 NOTE — Telephone Encounter (Signed)
Spoke to patient's daughter, [name] (on HIPPA) - she is aware of results.

## 2015-05-28 NOTE — Telephone Encounter (Signed)
-----   Message from Levert FeinsteinYijun Yan, MD sent at 05/28/2015  1:57 PM EST ----- Please call patient for normal laboratory result, no evidence of connective tissue disease, in specific, no evidence of Sjogren's

## 2015-05-29 NOTE — Telephone Encounter (Signed)
Daughter informed. Medication list updated

## 2015-06-02 NOTE — Telephone Encounter (Signed)
This request was also sent 05/26/2015 and has been taken care of, thank you.

## 2015-06-02 NOTE — Telephone Encounter (Signed)
I have electronically submitted pt's info for Prolia insurance verification and will notify you once I have a response. Thank you. °

## 2015-06-16 NOTE — Telephone Encounter (Signed)
Encounter below:  At [phone]

## 2015-06-16 NOTE — Telephone Encounter (Signed)
Pt's daughter, Wallis BambergFrieda called regarding this prolia shot. Can you please call her

## 2015-06-17 NOTE — Telephone Encounter (Signed)
i could not find a prolia vaccine in refrig with patient's name on it---not sure if this is ready for patient to receive vaccine, i have sent a note to rose brewer

## 2015-06-19 NOTE — Telephone Encounter (Signed)
Can you please call pt's daughter Wallis BambergFrieda at [phone]. I explained what was going on with this but she still wanted you to call her back

## 2015-06-19 NOTE — Telephone Encounter (Signed)
i explained to the patient's daughter the note rose brewer sent to me---patient's insurance co failed to start process for coverage for prolia vaccine---rose is to let Teresa Riley know when she hears from patient's insurance co, i will call patient and patient's daughter back to advise at that time

## 2015-06-24 NOTE — Telephone Encounter (Signed)
Form completed and faxed back to you Rose. Thanks!

## 2015-06-24 NOTE — Telephone Encounter (Signed)
Sent, thank you

## 2015-06-24 NOTE — Telephone Encounter (Signed)
Ok, thank you

## 2015-06-24 NOTE — Telephone Encounter (Signed)
That fax number is fine. Attn Bonney Berres  Thanks

## 2015-06-24 NOTE — Telephone Encounter (Signed)
I have rec'd Ms. BJ's WholesaleWalker's Engineer, manufacturing systemsinsurance verification for Prolia, but [employer] is requiring a p/a.  I have completed most of the form but there are some questions for either you or Dr. [name] to complete, then he will need to sign/date it.  Once it's complete, you can return to me via fax #[phone].  Do you want me to fax it to [phone] or do you have a different # you prefer?  Thank you.

## 2015-06-24 NOTE — Telephone Encounter (Signed)
Faxed completed p/a form along w/the most [facility] and will notify you once I have a response.

## 2015-07-15 NOTE — Telephone Encounter (Signed)
I called [employer] to check on the p/a and found it has been approved and is effective 06/24/2015-06/23/2017, authorization #[id number].  Teresa Riley's estimated responsibility w/out an OV will be $195 co-insurance; w/an OV the estimated responsibility will be $45 co-pay plus $195 co-insurance for an estimated total of $240.Please make pt aware this is an estimate and we will not know and exact amt until insurance(s) has/have paid.   I have sent a copy of the summary of benefits to be scanned into pt's chart.  Once pt recs injection, please let me know actual injection date so I can update the Prolia portal.    If pt cannot afford [phone]$195-$240 for her injection, please advise her to contact Prolia at [phone] and select option #1 to see if she qualifies for one of their assistance programs.  If she qualifies they will instruct her how to proceed.  If you have any questions, please let me know. Thank you.

## 2015-07-16 NOTE — Telephone Encounter (Signed)
Left message with patients daughter to call back, daughter needs to talk with Teresa Riley about prolia injection for her mother Teresa Riley(Teresa Riley)---patients daughter called back, daughter advised of rose brewer's note, daughter is going to check on assistance program and let me know if she still wants prolia injection, daughter to call back with what she decides to do

## 2015-07-31 NOTE — Telephone Encounter (Signed)
Pt was approved for the assistant program and they told her they sent them to [place].

## 2015-07-31 NOTE — Telephone Encounter (Signed)
Teresa Riley, this patient is stating that they were approved by the prolia assistance program--i cannot find anything that has been sent to dr john--did they notify you?--if not, where do i go from here--please advise, thanks

## 2015-08-04 ENCOUNTER — Telehealth: Payer: Self-pay

## 2015-08-04 NOTE — Telephone Encounter (Signed)
Patients daughter has called back in---states the assistance program was approved for patient, she has a card that was mailed to them with several ID numbers on card--assistance program told her they would be sending a fax to [place] office, we have not received that fax---i have sent a note to rose brewer asking for help with status---patients daughter needs to be called back after rose advises what to do next (prolia will still need to be ordered for this patient and an appt needs to made on nurse schedule, too)

## 2015-08-05 NOTE — Telephone Encounter (Signed)
Delaney Meigs, I have not seen anything on it.  Ask her to call the company providing assistance and request it to be faxed to my attn at [phone].  I thought that usually the companies required the patient to go to a pharmacy.  Thank you.

## 2015-08-14 ENCOUNTER — Ambulatory Visit (INDEPENDENT_AMBULATORY_CARE_PROVIDER_SITE_OTHER): Payer: Commercial Managed Care - HMO | Admitting: General Practice

## 2015-08-14 DIAGNOSIS — M810 Age-related osteoporosis without current pathological fracture: Secondary | ICD-10-CM | POA: Diagnosis not present

## 2015-08-14 MED ORDER — DENOSUMAB 60 MG/ML ~~LOC~~ SOLN
60.0000 mg | Freq: Once | SUBCUTANEOUS | Status: AC
  Administered 2015-08-14: 60 mg via SUBCUTANEOUS

## 2015-08-15 NOTE — Telephone Encounter (Signed)
Do we have a status update on this? Thank you.

## 2015-08-18 NOTE — Telephone Encounter (Signed)
Patient has already come in for prolia---we have obtained a card that was sent to them from the assistance program---patient did receive vaccine last week

## 2015-08-27 ENCOUNTER — Encounter: Payer: Self-pay | Admitting: Neurology

## 2015-08-27 ENCOUNTER — Ambulatory Visit (INDEPENDENT_AMBULATORY_CARE_PROVIDER_SITE_OTHER): Payer: Commercial Managed Care - HMO | Admitting: Neurology

## 2015-08-27 VITALS — BP 151/86 | HR 79 | Ht 63.0 in | Wt 159.0 lb

## 2015-08-27 DIAGNOSIS — R569 Unspecified convulsions: Secondary | ICD-10-CM | POA: Insufficient documentation

## 2015-08-27 DIAGNOSIS — G3184 Mild cognitive impairment, so stated: Secondary | ICD-10-CM | POA: Diagnosis not present

## 2015-08-27 DIAGNOSIS — I10 Essential (primary) hypertension: Secondary | ICD-10-CM | POA: Diagnosis not present

## 2015-08-27 DIAGNOSIS — R55 Syncope and collapse: Secondary | ICD-10-CM

## 2015-08-27 DIAGNOSIS — N289 Disorder of kidney and ureter, unspecified: Secondary | ICD-10-CM | POA: Diagnosis not present

## 2015-08-27 MED ORDER — LEVETIRACETAM 500 MG PO TABS: 500.0000 mg | ORAL_TABLET | Freq: Two times a day (BID) | ORAL | Status: DC

## 2015-08-27 NOTE — Progress Notes (Signed)
Chief Complaint  Patient presents with  . Syncope and Collapse    She is here with her daugher, Teresa Riley.  Says she is doing well on Keppra 1000mg , BID.  No further episodes reported.  . Memory Loss    MMSE 29/30 - 12 animals.  No new concerns with memory.  Feels Namenda XR 28mg  is helpful.     [facility]  PATIENT: Teresa Riley DOB: [birth year]   HISTORY OF PRESENT ILLNESS: Teresa Riley, 81 year old female returns for followup. She is now living with her daughter Teresa Riley.  She was referred by her primary care physician Dr. 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  MRI scan of the brain in 2014, shows extensive changes of chroniic microvascular ischemia and moderate degree of generalized cerebral atrophy and ventricular enlargement which appear slightly more advanced compared with previous MRI dated 06/03/2006.  She began to have episodes of seizure-like event since 2013, she felt overheated, then become confused, blank stares, then slump down, body jerking movement,   For a while, she continue have recurrent episode on titrating dose of Keppra up to 1500 mg twice a day, Vimpat 100 mg twice a day, EEG in March 2016 showed bifrontal delta slowing, with right hemispheric predominance. This is consistent with FIRDA.   Upon further questioning, all episode happened in a standing position, she was on 4 agents treatment for her hypertension, lisinopril 40 mg in the morning, amlodipine 5 mg in the morning, hydrochlorothiazide 25 mg in the morning, atenolol 300 mg twice a day, all the episodes clustered in the morning time, shortly after she take  her blood pressure medications.  I have decreased her blood pressure medication since May 2016, lisinopril 20 mg twice a day, amlodipine 5 mg every night, atenolol 300 mg every morning, stopped hydrochlorothiazide, and potassium supplement,   She no longer has recurrent passing out episode, seizure-like event, doing very well, continue has mild memory trouble,   UPDATE May 27 2015: She is now taking Keppra 1000 mg twice a day, there was no recurrent seizure like activity, she is overall doing well, she continue have mild unsteady gait, right hip pain, ambulate with a cane, I will continue to decrease her Keppra to 500 mg twice a day,  UPDATE Aug 27 2015: She is overall doing very well, there was no recurrent passing out episode, she was put on lower dose Keppra 500 mg twice a day since November 2016, daughter did reported previously she had one typical episode consistent with complex partial seizure, sitting at Teresa Riley office, had sudden loss of muscle tone, staring spells, followed by body jerking movement, lasting for few minutes, post event confusion, CURRENT DOSE OF KEPPRA 500 MG TWICE A DAY,  Today her blood pressure is mildly elevated 150/90, I have advised her continue to document her blood pressure daily ANA was negative, no evidence of connective tissue disease  REVIEW OF SYSTEMS: Full 14 system review of systems performed and notable only for those listed, all others are neg: As above, memory loss, confusion  ALLERGIES: No Known Allergies  HOME MEDICATIONS: Outpatient Prescriptions Prior to Visit  Medication Sig Dispense Refill  .  alendronate (FOSAMAX) 70 MG tablet Take 1 tablet (70 mg total) by mouth every 7 (seven) days. Take with a full glass of water on an empty stomach. 4 tablet 11  . ALPRAZolam (XANAX) 0.25 MG tablet Take 1 tablet (0.25 mg total) by mouth 2 (two) times daily as needed for anxiety. 60 tablet 5  . amLODipine (NORVASC) 5 MG tablet TAKE 1 TABLET BY MOUTH DAILY  90 tablet 3  . aspirin EC 81 MG tablet Take 81 mg by mouth daily with breakfast.    . atorvastatin (LIPITOR) 10 MG tablet take 1 tablet by mouth once daily 90 tablet 3  . Calcium-Magnesium-Vitamin D (CALCIUM MAGNESIUM PO) Take 1 tablet by mouth daily.    . cycloSPORINE (RESTASIS) 0.05 % ophthalmic emulsion Place 1 drop into both eyes at bedtime.    . fluocinonide cream (LIDEX) 0.05 % Apply topically 2 (two) times daily. 30 g 1  . fluticasone (FLONASE) 50 MCG/ACT nasal spray instill 1 spray into each nostril once daily 16 g 2  . labetalol (NORMODYNE) 300 MG tablet Take 1 tablet (300 mg total) by mouth every morning.    . levETIRAcetam (KEPPRA) 250 MG tablet Take 2 tablets (500 mg total) by mouth 2 (two) times daily. 120 tablet 6  . lisinopril (PRINIVIL,ZESTRIL) 20 MG tablet Take 1 tablet (20 mg total) by mouth 2 (two) times daily. 60 tablet 11  . loratadine (CLARITIN) 10 MG tablet Take 10 mg by mouth daily.    . Multiple Vitamin (MULTIVITAMIN WITH MINERALS) TABS tablet Take 1 tablet by mouth daily.    Marland Kitchen NAMENDA XR 28 MG CP24 24 hr capsule take 1 capsule by mouth daily 30 capsule 11  . Omega-3 Fatty Acids (OMEGA 3 PO) Take 1 capsule by mouth daily.    Bertram Gala Glycol-Propyl Glycol (SYSTANE OP) Place 2-4 drops into both eyes 4 (four) times daily as needed (for dry eyes).     Marland Kitchen levETIRAcetam (KEPPRA) 1000 MG tablet Take 1 tablet (1,000 mg total) by mouth 2 (two) times daily. 1.5 tabs (1500mg ) po twice daily (in am and pm) 90 tablet 6   No facility-administered medications prior to visit.    PAST MEDICAL HISTORY: Past Medical History  Diagnosis Date  . Hypertension   . HTN (hypertension) 10/29/2010  . H/O: hysterectomy 10/29/2010  . Chronic constipation 10/29/2010  . Eczema 10/29/2010  . History of cervical cancer 10/29/2010  . Allergic rhinitis, cause unspecified 10/29/2010  . Hyperlipidemia   . Orthostatic hypotension 06/05/2012  . Impaired glucose tolerance 08/16/2012  . Memory loss   .  Seizures (HCC)   . Vitamin D deficiency 09/05/2014  . Allergic rhinitis 09/05/2014  . Osteoporosis   . Right hip pain   . Gait difficulty   . Loss of appetite   . Vascular dementia     PAST SURGICAL HISTORY: Past Surgical History  Procedure Laterality Date  . Abdominal hysterectomy  1970  . Breast biopsy  1980's    benign  . Cataract extraction w/ intraocular lens  implant, bilateral  01/2011  . Eye surgery      FAMILY HISTORY: Family History  Problem Relation Age of Onset  . Hypertension Mother   . Hypertension Father   . High blood pressure Daughter     x2  . Colon cancer Neg Hx   . Colon polyps Neg Hx   . Kidney disease Neg Hx   . Gallbladder disease Neg Hx   . Diabetes Father  SOCIAL HISTORY: Social History   Social History  . Marital Status: Divorced    Spouse Name: N/A  . Number of Children: 3  . Years of Education: 2   Occupational History  . retired Runner, broadcasting/film/video    Social History Main Topics  . Smoking status: Former Smoker    Types: Cigarettes    Quit date: 07/13/1971  . Smokeless tobacco: Never Used     Comment: quit in 1973  . Alcohol Use: No  . Drug Use: No  . Sexual Activity: No   Other Topics Concern  . Not on file   Social History Narrative   Patient lives at home with two daughters Wadie Lessen) Anda Latina). Patient is retired. Patient has two years college.     PHYSICAL EXAM  Filed Vitals:   08/27/15 1420  BP: 151/86  Pulse: 79  Height:  (1.6 m)  Weight: 159 lb (72.122 kg)   Body mass index is 28.17 kg/(m^2). PHYSICAL EXAMNIATION:  Gen: NAD, conversant, well nourised, obese, well groomed                     Cardiovascular: Regular rate rhythm, no peripheral edema, warm, nontender. Eyes: Conjunctivae clear without exudates or hemorrhage Neck: Supple, no carotid bruise. Pulmonary: Clear to auscultation bilaterally   NEUROLOGICAL EXAM:  MENTAL STATUS: Speech:    Speech is normal; fluent and spontaneous with  normal comprehension.  Cognition: Mini-Mental Status Examination is 29 out of 30, animal naming is 12    The patient is oriented to person, place, and time;     recent and remote memory intact; She missed one out of 3 recalls    language fluent;     normal attention, concentration,     fund of knowledge.  CRANIAL NERVES: CN II: Visual fields are full to confrontation.  Pupils were equal round reactive to light. CN III, IV, VI: extraocular movement are normal. No ptosis. CN V: Facial sensation is intact to pinprick in all 3 divisions bilaterally. Corneal responses are intact.  CN VII: Face is symmetric with normal eye closure and smile. CN VIII: Hearing is normal to rubbing fingers CN IX, X: Palate elevates symmetrically. Phonation is normal. CN XI: Head turning and shoulder shrug are intact CN XII: Tongue is midline with normal movements and no atrophy.  MOTOR: There is no pronator drift of out-stretched arms. Muscle bulk and tone are normal. Muscle strength is normal.   REFLEXES: Reflexes are 2+ and symmetric at the biceps, triceps, knees, and ankles. Plantar responses are flexor.  SENSORY: Light touch, pinprick, position sense, and vibration sense are intact in fingers and toes.  COORDINATION: Rapid alternating movements and fine finger movements are intact. There is no dysmetria on finger-to-nose and heel-knee-shin.   GAIT/STANCE: Posture is normal. Cautious, mildly unsteady, wide-based, Difficulty with tandem walking  Romberg is absent.    DIAGNOSTIC DATA (LABS, IMAGING, TESTING) - I reviewed patient records, labs, notes, testing and imaging myself where available.x  ASSESSMENT AND PLAN  81 y.o. year old female  with past medical history of hypertension, hyperlipidemia,  Recurrent passing out episode  Most consistent with orthostatic blood pressure changes, which has much improved after changing her blood pressure medications,   She has stopped Vimpat  She did have  episode of sitting the chair, sudden loss of consciousness confusion staring, body jerking movement, consistent with complex partial seizure,   I will keep her on current dose of Keppra 500 mg twice a  day  Mild cognitive impairment   Mini-Mental Status Examination 29 out of 30 today,   Evidence of brain atrophy   Continue Namenda XR 28 mg daily  She could not tolerate Aricept, with increased passing out spells in the past.  Levert Feinstein, M.D. Ph.D. [facility] [address] [place], [place] [address] [phone]

## 2015-09-16 ENCOUNTER — Ambulatory Visit (INDEPENDENT_AMBULATORY_CARE_PROVIDER_SITE_OTHER): Payer: Commercial Managed Care - HMO | Admitting: Internal Medicine

## 2015-09-16 ENCOUNTER — Encounter: Payer: Self-pay | Admitting: Internal Medicine

## 2015-09-16 ENCOUNTER — Other Ambulatory Visit (INDEPENDENT_AMBULATORY_CARE_PROVIDER_SITE_OTHER): Payer: Commercial Managed Care - HMO

## 2015-09-16 VITALS — BP 138/82 | HR 72 | Temp 98.0°F | Resp 20 | Wt 163.0 lb

## 2015-09-16 DIAGNOSIS — R7989 Other specified abnormal findings of blood chemistry: Secondary | ICD-10-CM

## 2015-09-16 DIAGNOSIS — I10 Essential (primary) hypertension: Secondary | ICD-10-CM

## 2015-09-16 DIAGNOSIS — Z0000 Encounter for general adult medical examination without abnormal findings: Secondary | ICD-10-CM | POA: Diagnosis not present

## 2015-09-16 DIAGNOSIS — G3184 Mild cognitive impairment, so stated: Secondary | ICD-10-CM | POA: Diagnosis not present

## 2015-09-16 DIAGNOSIS — Z0189 Encounter for other specified special examinations: Secondary | ICD-10-CM | POA: Diagnosis not present

## 2015-09-16 DIAGNOSIS — R531 Weakness: Secondary | ICD-10-CM | POA: Diagnosis not present

## 2015-09-16 LAB — CBC WITH DIFFERENTIAL/PLATELET
Basophils Absolute: 0.1 10*3/uL (ref 0.0–0.1)
Basophils Relative: 1.1 % (ref 0.0–3.0)
Eosinophils Absolute: 0.1 10*3/uL (ref 0.0–0.7)
Eosinophils Relative: 1.6 % (ref 0.0–5.0)
HCT: 38.2 % (ref 36.0–46.0)
Hemoglobin: 12.9 g/dL (ref 12.0–15.0)
Lymphocytes Relative: 37 % (ref 12.0–46.0)
Lymphs Abs: 2.7 10*3/uL (ref 0.7–4.0)
MCHC: 33.8 g/dL (ref 30.0–36.0)
MCV: 94.3 fl (ref 78.0–100.0)
Monocytes Absolute: 0.4 10*3/uL (ref 0.1–1.0)
Monocytes Relative: 5.5 % (ref 3.0–12.0)
Neutro Abs: 4 10*3/uL (ref 1.4–7.7)
Neutrophils Relative %: 54.8 % (ref 43.0–77.0)
Platelets: 249 10*3/uL (ref 150.0–400.0)
RBC: 4.05 Mil/uL (ref 3.87–5.11)
RDW: 12.8 % (ref 11.5–15.5)
WBC: 7.4 10*3/uL (ref 4.0–10.5)

## 2015-09-16 LAB — BASIC METABOLIC PANEL
BUN: 14 mg/dL (ref 6–23)
CO2: 29 mEq/L (ref 19–32)
Calcium: 10 mg/dL (ref 8.4–10.5)
Chloride: 103 mEq/L (ref 96–112)
Creatinine, Ser: 1.19 mg/dL (ref 0.40–1.20)
GFR: 56.32 mL/min — ABNORMAL LOW (ref >60.00)
Glucose, Bld: 127 mg/dL — ABNORMAL HIGH (ref 70–99)
Potassium: 3.7 mEq/L (ref 3.5–5.1)
Sodium: 142 mEq/L (ref 135–145)

## 2015-09-16 LAB — HEPATIC FUNCTION PANEL
ALBUMIN: 4.3 g/dL (ref 3.5–5.2)
ALK PHOS: 63 U/L (ref 39–117)
ALT: 19 U/L (ref 0–35)
AST: 26 U/L (ref 0–37)
Bilirubin, Direct: 0.1 mg/dL (ref 0.0–0.3)
Total Bilirubin: 0.4 mg/dL (ref 0.2–1.2)
Total Protein: 7.9 g/dL (ref 6.0–8.3)

## 2015-09-16 LAB — LIPID PANEL
CHOL/HDL RATIO: 3
CHOLESTEROL: 197 mg/dL (ref 0–200)
HDL: 68.5 mg/dL (ref >39.00)
NonHDL: 128.02
TRIGLYCERIDES: 244 mg/dL — AB (ref 0.0–149.0)
VLDL: 48.8 mg/dL — AB (ref 0.0–40.0)

## 2015-09-16 LAB — TSH: TSH: 0.89 u[IU]/mL (ref 0.35–4.50)

## 2015-09-16 LAB — LDL CHOLESTEROL, DIRECT: Direct LDL: 93 mg/dL

## 2015-09-16 NOTE — Assessment & Plan Note (Signed)
Overall stabel, to cont namenda,  to f/u any worsening symptoms or concerns

## 2015-09-16 NOTE — Assessment & Plan Note (Signed)

## 2015-09-16 NOTE — Assessment & Plan Note (Signed)
stable overall by history and exam, recent data reviewed with pt, and pt to continue medical treatment as before,  to f/u any worsening symptoms or concerns BP Readings from Last 3 Encounters:  09/16/15 138/82  08/27/15 151/86  05/27/15 175/94

## 2015-09-16 NOTE — Assessment & Plan Note (Signed)
.   Lab Results  Component Value Date   WBC 5.2 09/05/2014   HGB 11.4* 09/05/2014   HCT 33.3* 09/05/2014   PLT 258.0 09/05/2014   GLUCOSE 95 03/12/2015   CHOL 176 09/05/2014   TRIG 90.0 09/05/2014   HDL 66.50 09/05/2014   LDLDIRECT 170.4 12/05/2012   LDLCALC 92 09/05/2014   ALT 11 09/05/2014   AST 22 09/05/2014   NA 138 03/12/2015   K 4.4 03/12/2015   CL 105 03/12/2015   CREATININE 1.04 03/12/2015   BUN 10 03/12/2015   CO2 28 03/12/2015   TSH 1.15 09/05/2014   HGBA1C 6.0 03/11/2014   Will repeat labs, also ok for referral geriatrics

## 2015-09-16 NOTE — Progress Notes (Signed)
Pre visit review using our clinic review tool, if applicable. No additional management support is needed unless otherwise documented below in the visit note. 

## 2015-09-16 NOTE — Progress Notes (Signed)
Subjective:    Patient ID: Nonnie DoneLucy M Arana, female    DOB: 1934-12-07, 81 y.o.   MRN: [id number]  HPI  Here for wellness and f/u;  Overall doing ok;  Pt denies Chest pain, worsening SOB, DOE, wheezing, orthopnea, PND, worsening LE edema, palpitations, dizziness or syncope.  Pt denies neurological change such as new headache, facial or extremity weakness.  Pt denies polydipsia, polyuria, or low sugar symptoms. Pt states overall good compliance with treatment and medications, good tolerability, and has been trying to follow appropriate diet.  Pt denies worsening depressive symptoms, suicidal ideation or panic. No fever, night sweats, wt loss, loss of appetite, or other constitutional symptoms.  Pt states good ability with ADL's, has low fall risk, home safety reviewed and adequate, no other significant changes in hearing or vision, and only occasionally active with exercise.  Does mention dryness of the scalp, and gen'd mild weakness recently.  Last labs approx 1 yr.  Family very supportive, asks for geriatric referral as well. Dementia overall stable symptomatically, and not assoc with behavioral changes such as hallucinations, paranoia, or agitation. Past Medical History  Diagnosis Date  . Hypertension   . HTN (hypertension) 10/29/2010  . H/O: hysterectomy 10/29/2010  . Chronic constipation 10/29/2010  . Eczema 10/29/2010  . History of cervical cancer 10/29/2010  . Allergic rhinitis, cause unspecified 10/29/2010  . Hyperlipidemia   . Orthostatic hypotension 06/05/2012  . Impaired glucose tolerance 08/16/2012  . Memory loss   . Seizures (HCC)   . Vitamin D deficiency 09/05/2014  . Allergic rhinitis 09/05/2014  . Osteoporosis   . Right hip pain   . Gait difficulty   . Loss of appetite   . Vascular dementia    Past Surgical History  Procedure Laterality Date  . Abdominal hysterectomy  1970  . Breast biopsy  1980's    benign  . Cataract extraction w/ intraocular lens  implant, bilateral  01/2011  .  Eye surgery      reports that she quit smoking about 44 years ago. Her smoking use included Cigarettes. She has never used smokeless tobacco. She reports that she does not drink alcohol or use illicit drugs. family history includes Diabetes in her father; High blood pressure in her daughter; Hypertension in her father and mother. There is no history of Colon cancer, Colon polyps, Kidney disease, or Gallbladder disease. No Known Allergies Current Outpatient Prescriptions on File Prior to Visit  Medication Sig Dispense Refill  . alendronate (FOSAMAX) 70 MG tablet Take 1 tablet (70 mg total) by mouth every 7 (seven) days. Take with a full glass of water on an empty stomach. 4 tablet 11  . ALPRAZolam (XANAX) 0.25 MG tablet Take 1 tablet (0.25 mg total) by mouth 2 (two) times daily as needed for anxiety. 60 tablet 5  . amLODipine (NORVASC) 5 MG tablet TAKE 1 TABLET BY MOUTH DAILY 90 tablet 3  . aspirin EC 81 MG tablet Take 81 mg by mouth daily with breakfast.    . atorvastatin (LIPITOR) 10 MG tablet take 1 tablet by mouth once daily 90 tablet 3  . Calcium-Magnesium-Vitamin D (CALCIUM MAGNESIUM PO) Take 1 tablet by mouth daily.    . cycloSPORINE (RESTASIS) 0.05 % ophthalmic emulsion Place 1 drop into both eyes at bedtime.    . fluocinonide cream (LIDEX) 0.05 % Apply topically 2 (two) times daily. 30 g 1  . fluticasone (FLONASE) 50 MCG/ACT nasal spray instill 1 spray into each nostril once daily 16 g 2  .  labetalol (NORMODYNE) 300 MG tablet Take 1 tablet (300 mg total) by mouth every morning.    . levETIRAcetam (KEPPRA) 500 MG tablet Take 1 tablet (500 mg total) by mouth 2 (two) times daily. 60 tablet 11  . lisinopril (PRINIVIL,ZESTRIL) 20 MG tablet Take 1 tablet (20 mg total) by mouth 2 (two) times daily. 60 tablet 11  . loratadine (CLARITIN) 10 MG tablet Take 10 mg by mouth daily.    . Multiple Vitamin (MULTIVITAMIN WITH MINERALS) TABS tablet Take 1 tablet by mouth daily.    Marland Kitchen NAMENDA XR 28 MG CP24  24 hr capsule take 1 capsule by mouth daily 30 capsule 11  . Omega-3 Fatty Acids (OMEGA 3 PO) Take 1 capsule by mouth daily.    Bertram Gala Glycol-Propyl Glycol (SYSTANE OP) Place 2-4 drops into both eyes 4 (four) times daily as needed (for dry eyes).      No current facility-administered medications on file prior to visit.   Review of Systems Constitutional: Negative for increased diaphoresis, other activity, appetite or siginficant weight change other than noted HENT: Negative for worsening hearing loss, ear pain, facial swelling, mouth sores and neck stiffness.   Eyes: Negative for other worsening pain, redness or visual disturbance.  Respiratory: Negative for shortness of breath and wheezing  Cardiovascular: Negative for chest pain and palpitations.  Gastrointestinal: Negative for diarrhea, blood in stool, abdominal distention or other pain Genitourinary: Negative for hematuria, flank pain or change in urine volume.  Musculoskeletal: Negative for myalgias or other joint complaints.  Skin: Negative for color change and wound or drainage.  Neurological: Negative for syncope and numbness. other than noted Hematological: Negative for adenopathy. or other swelling Psychiatric/Behavioral: Negative for hallucinations, SI, self-injury, decreased concentration or other worsening agitation.      Objective:   Physical Exam BP 138/82 mmHg  Pulse 72  Temp(Src) 98 F (36.7 C) (Oral)  Resp 20  Wt 163 lb (73.936 kg)  SpO2 96% VS noted,  Constitutional: Pt is oriented to person, place, and time. Appears well-developed and well-nourished, in no significant distress Head: Normocephalic and atraumatic.  Right Ear: External ear normal.  Left Ear: External ear normal.  Nose: Nose normal.  Mouth/Throat: Oropharynx is clear and moist.  Eyes: Conjunctivae and EOM are normal. Pupils are equal, round, and reactive to light.  Neck: Normal range of motion. Neck supple. No JVD present. No tracheal  deviation present or significant neck LA or mass Cardiovascular: Normal rate, regular rhythm, normal heart sounds and intact distal pulses.   Pulmonary/Chest: Effort normal and breath sounds without rales or wheezing  Abdominal: Soft. Bowel sounds are normal. NT. No HSM  Musculoskeletal: Normal range of motion. Exhibits no edema.  Lymphadenopathy:  Has no cervical adenopathy.  Neurological: Pt is alert and oriented to person, place, and time. Pt has normal reflexes. No cranial nerve deficit. Motor grossly intact, + ST memory dysfxn Skin: Skin is warm and dry. No rash noted.  Psychiatric:  Has normal mood and affect. Behavior is normal.     Assessment & Plan:

## 2015-09-16 NOTE — Patient Instructions (Signed)
Please continue all other medications as before, and refills have been done if requested.  Please have the pharmacy call with any other refills you may need.  Please continue your efforts at being more active, low cholesterol diet, and weight control.  You are otherwise up to date with prevention measures today.  Please keep your appointments with your specialists as you may have planned  You will be contacted regarding the referral for: geriatrics  Please go to the LAB in the Basement (turn left off the elevator) for the tests to be done today  You will be contacted by phone if any changes need to be made immediately.  Otherwise, you will receive a letter about your results with an explanation, but please check with MyChart first.  Please remember to sign up for MyChart if you have not done so, as this will be important to you in the future with finding out test results, communicating by private email, and scheduling acute appointments online when needed.  Please return in 1 year for your yearly visit, or sooner if needed, with Lab testing done 3-5 days before

## 2015-10-30 DIAGNOSIS — G40909 Epilepsy, unspecified, not intractable, without status epilepticus: Secondary | ICD-10-CM | POA: Diagnosis not present

## 2015-10-30 DIAGNOSIS — R140 Abdominal distension (gaseous): Secondary | ICD-10-CM | POA: Diagnosis not present

## 2015-10-30 DIAGNOSIS — R0609 Other forms of dyspnea: Secondary | ICD-10-CM | POA: Diagnosis not present

## 2015-10-30 DIAGNOSIS — R1310 Dysphagia, unspecified: Secondary | ICD-10-CM | POA: Diagnosis not present

## 2015-10-30 DIAGNOSIS — R5383 Other fatigue: Secondary | ICD-10-CM | POA: Diagnosis not present

## 2015-10-30 DIAGNOSIS — F0390 Unspecified dementia without behavioral disturbance: Secondary | ICD-10-CM | POA: Diagnosis not present

## 2015-10-30 DIAGNOSIS — M160 Bilateral primary osteoarthritis of hip: Secondary | ICD-10-CM | POA: Diagnosis not present

## 2015-10-30 DIAGNOSIS — L853 Xerosis cutis: Secondary | ICD-10-CM | POA: Diagnosis not present

## 2015-10-30 DIAGNOSIS — K1379 Other lesions of oral mucosa: Secondary | ICD-10-CM | POA: Diagnosis not present

## 2015-11-05 ENCOUNTER — Other Ambulatory Visit: Payer: Self-pay

## 2015-11-05 MED ORDER — ATORVASTATIN CALCIUM 10 MG PO TABS: 10.0000 mg | ORAL_TABLET | Freq: Every day | ORAL | Status: DC

## 2016-01-12 ENCOUNTER — Other Ambulatory Visit: Payer: Self-pay | Admitting: *Deleted

## 2016-01-12 MED ORDER — LISINOPRIL 20 MG PO TABS: 20.0000 mg | ORAL_TABLET | Freq: Two times a day (BID) | ORAL | Status: DC

## 2016-01-20 DIAGNOSIS — Z9181 History of falling: Secondary | ICD-10-CM | POA: Diagnosis not present

## 2016-01-20 DIAGNOSIS — G40209 Localization-related (focal) (partial) symptomatic epilepsy and epileptic syndromes with complex partial seizures, not intractable, without status epilepticus: Secondary | ICD-10-CM | POA: Diagnosis not present

## 2016-01-20 DIAGNOSIS — G3184 Mild cognitive impairment, so stated: Secondary | ICD-10-CM | POA: Diagnosis not present

## 2016-01-20 DIAGNOSIS — R2689 Other abnormalities of gait and mobility: Secondary | ICD-10-CM | POA: Diagnosis not present

## 2016-02-03 ENCOUNTER — Other Ambulatory Visit: Payer: Self-pay | Admitting: *Deleted

## 2016-02-03 MED ORDER — ALBUTEROL SULFATE HFA 108 (90 BASE) MCG/ACT IN AERS: 2.0000 | INHALATION_SPRAY | Freq: Four times a day (QID) | RESPIRATORY_TRACT | 3 refills | Status: DC | PRN

## 2016-02-24 ENCOUNTER — Ambulatory Visit (INDEPENDENT_AMBULATORY_CARE_PROVIDER_SITE_OTHER): Payer: Commercial Managed Care - HMO | Admitting: Nurse Practitioner

## 2016-02-24 ENCOUNTER — Encounter: Payer: Self-pay | Admitting: Nurse Practitioner

## 2016-02-24 VITALS — BP 156/85 | HR 68 | Ht 63.0 in | Wt 160.8 lb

## 2016-02-24 DIAGNOSIS — G40109 Localization-related (focal) (partial) symptomatic epilepsy and epileptic syndromes with simple partial seizures, not intractable, without status epilepticus: Secondary | ICD-10-CM | POA: Diagnosis not present

## 2016-02-24 DIAGNOSIS — G3184 Mild cognitive impairment, so stated: Secondary | ICD-10-CM

## 2016-02-24 DIAGNOSIS — I10 Essential (primary) hypertension: Secondary | ICD-10-CM | POA: Diagnosis not present

## 2016-02-24 MED ORDER — MEMANTINE HCL ER 28 MG PO CP24: 28.0000 mg | ORAL_CAPSULE | Freq: Every day | ORAL | 11 refills | Status: DC

## 2016-02-24 NOTE — Patient Instructions (Addendum)
Memory score stable continue Namenda will refill Continue Keppra 500 twice a day for seizures Use cane at all times for safe ambulation

## 2016-02-24 NOTE — Progress Notes (Signed)
I have reviewed and agreed above plan. 

## 2016-02-24 NOTE — Progress Notes (Signed)
[facility]  PATIENT: Teresa Riley DOB: February 28, 1935   REASON FOR VISIT: Follow-up for seizure disorder and memory loss HISTORY FROM: Patient and daughter Teresa Riley    HISTORY OF PRESENT ILLNESS:Teresa Riley, 81 year old female returns for followup. She is now living with her daughter Teresa Riley. She was referred by her primary care physician Dr. 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 MRI scan of the brain in 2014, shows extensive changes of chroniic microvascular ischemia and moderate degree of generalized cerebral atrophy and ventricular enlargement which appear slightly more advanced compared with previous MRI dated 06/03/2006. She began to have episodes of seizure-like event since 2013, she felt overheated, then become confused, blank stares, then slump down, body jerking movement,   For a while, she continue have recurrent episode on titrating dose of Keppra up to 1500 mg twice a day, Vimpat 100 mg twice a day, EEG in March 2016 showed bifrontal delta slowing, with right hemispheric predominance. This is consistent with FIRDA.  Upon further questioning, all episode happened in a standing position, she was on 4 agents treatment for her hypertension, lisinopril 40 mg in the morning, amlodipine 5 mg in the morning, hydrochlorothiazide 25 mg in the morning, atenolol 300 mg twice a day, all the episodes clustered in the morning time, shortly after she take her blood pressure medications.  I have decreased her blood pressure medication since May 2016, lisinopril 20 mg twice a day, amlodipine 5 mg every night, atenolol 300 mg every morning,  stopped hydrochlorothiazide, and potassium supplement,  She no longer has recurrent passing out episode, seizure-like event, doing very well, continue has mild memory trouble,   UPDATE May 27 2015:YY She is now taking Keppra 1000 mg twice a day, there was no recurrent seizure like activity, she is overall doing well, she continue have mild unsteady gait, right hip pain, ambulate with a cane, I will continue to decrease her Keppra to 500 mg twice a day,  UPDATE Aug 27 2015:YY She is overall doing very well, there was no recurrent passing out episode, she was put on lower dose Keppra 500 mg twice a day since November 2016, daughter did reported previously she had one typical episode consistent with complex partial seizure, sitting at Dr. Raphael GibneyJohn's office, had sudden loss of muscle tone, staring spells, followed by body jerking movement, lasting for few minutes, post event confusion, CURRENT DOSE OF KEPPRA 500 MG TWICE A DAY, Today her blood pressure is mildly elevated 150/90, I have advised her continue to document her blood pressure daily ANA was negative, no evidence of connective tissue disease UPDATE 08/15/2017CM Teresa Riley, 81 year old female returns for follow-up with her daughter. She has a history of complex partial seizure disorder and is currently on Keppra 500 mg twice daily without further seizure activity. In addition she has memory loss. MRI scan of the brain in 2014, shows extensive changes of chroniic microvascular ischemia and moderate degree of generalized cerebral atrophy and ventricular enlargement which appear slightly more advanced compared with previous MRI dated 06/03/2006. She is currently on Namenda extended release 28 mg daily. She did not tolerate Aricept in the past due to having episodes of passing out. MMSE  today is stable. She returns for reevaluation REVIEW OF SYSTEMS: Full 14 system review of systems performed and notable only for those listed, all others are neg:    Constitutional: neg  Cardiovascular: neg Ear/Nose/Throat: neg  Skin: neg Eyes: neg Respiratory: neg Gastroitestinal: neg  Hematology/Lymphatic: neg  Endocrine: neg Musculoskeletal:neg Allergy/Immunology: neg Neurological: neg Psychiatric: neg Sleep : neg   ALLERGIES: No Known Allergies  HOME MEDICATIONS: Outpatient Medications Prior to Visit  Medication Sig Dispense Refill  . albuterol (PROAIR HFA) 108 (90 Base) MCG/ACT inhaler Inhale 2 puffs into the lungs every 6 (six) hours as needed for wheezing or shortness of breath. 18 g 3  . ALPRAZolam (XANAX) 0.25 MG tablet Take 1 tablet (0.25 mg total) by mouth 2 (two) times daily as needed for anxiety. 60 tablet 5  . amLODipine (NORVASC) 5 MG tablet TAKE 1 TABLET BY MOUTH DAILY 90 tablet 3  . aspirin EC 81 MG tablet Take 81 mg by mouth daily with breakfast.    . atorvastatin (LIPITOR) 10 MG tablet Take 1 tablet (10 mg total) by mouth daily. 90 tablet 3  . Calcium-Magnesium-Vitamin D (CALCIUM MAGNESIUM PO) Take 1 tablet by mouth daily.    . cycloSPORINE (RESTASIS) 0.05 % ophthalmic emulsion Place 1 drop into both eyes at bedtime.    . fluocinonide cream (LIDEX) 0.05 % Apply topically 2 (two) times daily. 30 g 1  . fluticasone (FLONASE) 50 MCG/ACT nasal spray instill 1 spray into each nostril once daily 16 g 2  . labetalol (NORMODYNE) 300 MG tablet Take 1 tablet (300 mg total) by mouth every morning.    . levETIRAcetam (KEPPRA) 500 MG tablet Take 1 tablet (500 mg total) by mouth 2 (two) times daily. 60 tablet 11  . lisinopril (PRINIVIL,ZESTRIL) 20 MG tablet Take 1 tablet (20 mg total) by mouth 2 (two) times daily. 60 tablet 11  . loratadine (CLARITIN) 10 MG tablet Take 10 mg by mouth daily.    . Multiple Vitamin (MULTIVITAMIN WITH MINERALS) TABS tablet Take 1 tablet by mouth daily.    Marland Kitchen. NAMENDA XR 28 MG CP24 24 hr capsule take 1 capsule by mouth daily 30 capsule 11  . Omega-3 Fatty Acids (OMEGA 3 PO) Take 1 capsule by mouth daily.     Bertram Gala. Polyethyl Glycol-Propyl Glycol (SYSTANE OP) Place 2-4 drops into both eyes 4 (four) times daily as needed (for dry eyes).     Marland Kitchen. alendronate (FOSAMAX) 70 MG tablet Take 1 tablet (70 mg total) by mouth every 7 (seven) days. Take with a full glass of water on an empty stomach. (Patient not taking: Reported on 02/24/2016) 4 tablet 11   No facility-administered medications prior to visit.     PAST MEDICAL HISTORY: Past Medical History:  Diagnosis Date  . Allergic rhinitis 09/05/2014  . Allergic rhinitis, cause unspecified 10/29/2010  . Chronic constipation 10/29/2010  . Eczema 10/29/2010  . Gait difficulty   . H/O: hysterectomy 10/29/2010  . History of cervical cancer 10/29/2010  . HTN (hypertension) 10/29/2010  . Hyperlipidemia   . Hypertension   . Impaired glucose tolerance 08/16/2012  . Loss of appetite   . Memory loss   . Orthostatic hypotension 06/05/2012  . Osteoporosis   . Right hip pain   . Seizures (HCC)   . Vascular dementia   . Vitamin D deficiency 09/05/2014    PAST SURGICAL HISTORY: Past Surgical History:  Procedure Laterality Date  . ABDOMINAL HYSTERECTOMY  1970  . BREAST BIOPSY  1980's  benign  . CATARACT EXTRACTION W/ INTRAOCULAR LENS  IMPLANT, BILATERAL  01/2011  . EYE SURGERY      FAMILY HISTORY: Family History  Problem Relation Age of Onset  . Hypertension Mother   . Hypertension Father   . High blood pressure Daughter     x2  . Colon cancer Neg Hx   . Colon polyps Neg Hx   . Kidney disease Neg Hx   . Gallbladder disease Neg Hx   . Diabetes Father     SOCIAL HISTORY: Social History   Social History  . Marital status: Divorced    Spouse name: N/A  . Number of children: 3  . Years of education: 2   Occupational History  . retired Runner, broadcasting/film/video    Social History Main Topics  . Smoking status: Former Smoker    Types: Cigarettes    Quit date: 07/13/1971  . Smokeless tobacco: Never Used     Comment: quit in 1973  . Alcohol use No  . Drug use: No  .  Sexual activity: No   Other Topics Concern  . Not on file   Social History Narrative   Patient lives at home with two daughters Wadie Lessen) Anda Latina). Patient is retired. Patient has two years college.     PHYSICAL EXAM  Vitals:   02/24/16 1313  BP: (!) 156/85  Pulse: 68  Weight: 160 lb 12.8 oz (72.9 kg)  Height: 5\' 3"  (1.6 m)   Body mass index is 28.48 kg/m.  Generalized: Well developed, in no acute distress  Head: normocephalic and atraumatic,. Oropharynx benign  Neck: Supple, no carotid bruits  Cardiac: Regular rate rhythm, no murmur  Musculoskeletal: No deformity   Neurological examination   Mentation: Alert oriented to time, place, history taking.MMSE 28/30 missing one item in orientation and 1 of 3 recall. AFT 8. Clock drawing 4/4. Attention span and concentration appropriate.  Follows all commands speech and language fluent.   Cranial nerve II-XII: Pupils were equal round reactive to light extraocular movements were full, visual field were full on confrontational test. Facial sensation and strength were normal. hearing was intact to finger rubbing bilaterally. Uvula tongue midline. head turning and shoulder shrug were normal and symmetric.Tongue protrusion into cheek strength was normal. Motor: normal bulk and tone, full strength in the BUE, BLE, fine finger movements normal, no pronator drift. No focal weakness Sensory: normal and symmetric to light touch, pinprick, and  Vibration, proprioception  Coordination: finger-nose-finger, heel-to-shin bilaterally, no dysmetria Reflexes: Brachioradialis 2/2, biceps 2/2, triceps 2/2, patellar 2/2, Achilles 2/2, plantar responses were flexor bilaterally. Gait and Station: Rising up from seated position with push off, mildly unsteady wide-based gait, ambulates with single-point cane   DIAGNOSTIC DATA (LABS, IMAGING, TESTING) - I reviewed patient records, labs, notes, testing and imaging myself where available.  Lab  Results  Component Value Date   WBC 7.4 09/16/2015   HGB 12.9 09/16/2015   HCT 38.2 09/16/2015   MCV 94.3 09/16/2015   PLT 249.0 09/16/2015      Component Value Date/Time   NA 142 09/16/2015 1554   K 3.7 09/16/2015 1554   CL 103 09/16/2015 1554   CO2 29 09/16/2015 1554   GLUCOSE 127 (H) 09/16/2015 1554   BUN 14 09/16/2015 1554   CREATININE 1.19 09/16/2015 1554   CALCIUM 10.0 09/16/2015 1554   PROT 7.9 09/16/2015 1554   ALBUMIN 4.3 09/16/2015 1554   AST 26 09/16/2015 1554   ALT 19 09/16/2015 1554   ALKPHOS  63 09/16/2015 1554   BILITOT 0.4 09/16/2015 1554   GFRNONAA 43 (L) 06/23/2014 1214   GFRAA 50 (L) 06/23/2014 1214   Lab Results  Component Value Date   CHOL 197 09/16/2015   HDL 68.50 09/16/2015   LDLCALC 92 09/05/2014   LDLDIRECT 93.0 09/16/2015   TRIG 244.0 (H) 09/16/2015   CHOLHDL 3 09/16/2015   Lab Results  Component Value Date   HGBA1C 6.0 03/11/2014   Lab Results  Component Value Date   VITAMINB12 924 (H) 10/02/2013   Lab Results  Component Value Date   TSH 0.89 09/16/2015      ASSESSMENT AND PLAN  81 y.o. year old female  has a past medical history of recurrent passing out episode most consistent with orthostatic blood pressure changes which has improved with medication changes. Mild cognitive impairment and complex partial seizure disorder  PLAN: Memory score stable continue Namenda will refill Continue Keppra 500 twice a day for seizures Call for any seizure activity Use cane at all times for safe ambulation Follow-up in 6 months Nilda Riggs, [facility], [facility], APRN  [facility] [address] [place], [place] [address] [phone]

## 2016-02-25 ENCOUNTER — Telehealth: Payer: Self-pay | Admitting: Internal Medicine

## 2016-03-09 ENCOUNTER — Telehealth: Payer: Self-pay

## 2016-03-09 NOTE — Telephone Encounter (Signed)
Patients daughter called about the Prolia injection. She states that she should of had the 2nd on Aug 2. She states she has tired to call a couple of times and left a message. I do not see any messages in here about this. She is ready to get her 2nd injection. Please follow up or advise I can call her back and sch a app. Thank you.

## 2016-03-10 ENCOUNTER — Ambulatory Visit (INDEPENDENT_AMBULATORY_CARE_PROVIDER_SITE_OTHER): Payer: Commercial Managed Care - HMO

## 2016-03-10 DIAGNOSIS — M810 Age-related osteoporosis without current pathological fracture: Secondary | ICD-10-CM | POA: Diagnosis not present

## 2016-03-10 MED ORDER — DENOSUMAB 60 MG/ML ~~LOC~~ SOLN
60.0000 mg | Freq: Once | SUBCUTANEOUS | Status: AC
  Administered 2016-03-10: 60 mg via SUBCUTANEOUS

## 2016-03-10 NOTE — Telephone Encounter (Signed)
Patient's insurance required PA---I have reached out to kristin/[employer] and faxed over additional clinical notes to justify need for prolia injection---per kristin, these documents have been submitted to insurance co and they are approving prolia injection with $0 copay---I will be calling patient's daughter back to schedule nurse visit for prolia injection----patient's daughter is freda mitchell at [phone]

## 2016-03-10 NOTE — Telephone Encounter (Signed)
Advised daughter that approval is in---estimated $0 copay---nurse visit scheduled today at 1:00 for prolia injection

## 2016-03-15 ENCOUNTER — Other Ambulatory Visit: Payer: Self-pay | Admitting: Internal Medicine

## 2016-03-16 NOTE — Telephone Encounter (Signed)
faxed

## 2016-03-16 NOTE — Telephone Encounter (Signed)
Done hardcopy to Corinne  

## 2016-04-23 DIAGNOSIS — M810 Age-related osteoporosis without current pathological fracture: Secondary | ICD-10-CM | POA: Diagnosis not present

## 2016-05-16 ENCOUNTER — Other Ambulatory Visit: Payer: Self-pay | Admitting: Internal Medicine

## 2016-05-21 ENCOUNTER — Ambulatory Visit: Payer: Commercial Managed Care - HMO | Admitting: Internal Medicine

## 2016-05-28 ENCOUNTER — Ambulatory Visit (INDEPENDENT_AMBULATORY_CARE_PROVIDER_SITE_OTHER): Payer: Commercial Managed Care - HMO

## 2016-05-28 DIAGNOSIS — Z23 Encounter for immunization: Secondary | ICD-10-CM | POA: Diagnosis not present

## 2016-06-11 ENCOUNTER — Encounter: Payer: Self-pay | Admitting: Nurse Practitioner

## 2016-06-28 ENCOUNTER — Telehealth: Payer: Self-pay | Admitting: Nurse Practitioner

## 2016-06-28 NOTE — Telephone Encounter (Signed)
Patient's daughter is calling because the patient is had problems with dizziness since 06-24-16 which is causing  her to have balance issues. The patient had an appontment in February with Eber Jonesarolyn for a follow up but wlll have to be changed because Eber JonesCarolyn is out of the office. Can she still be schedule with Eber Jonesarolyn for this problem sooner?

## 2016-06-28 NOTE — Telephone Encounter (Signed)
Spoke to Entergy CorporationFreda Mitchell (dgt on HIPAA) - states her mother had an episode of dizziness and feeling swimmy-headed last Wednesday - symptoms resolved by Thurs w/out treatment.  She developed the same symptoms this morning.  About two hours later, her mother had a seizure.  She was at home alone - started feeling hot then woke up in the floor a short time later.  When she woke up, her dentures had been thrown out of her mouth and she had urinated and vomited on herself.  She denies missing any doses of Keppra.  Spoke to Dr. Terrace ArabiaYan - requested pt to be seen by NP.  Added to Megan's schedule in an available slot.

## 2016-06-28 NOTE — Telephone Encounter (Signed)
Pt's daughter called back, said she is having discomfort behind her eyes and a certain way she moves her head feels "swimmy headed".  She would like an appt for the pt to be seen

## 2016-06-28 NOTE — Telephone Encounter (Signed)
Patient's daughter is calling back. The patient had a seizure an hour ago, she was by herself but she is better now. The daughter has questions about the patient's medication levETIRAcetam (KEPPRA) 500 MG tablet. Please call to discuss.

## 2016-06-28 NOTE — Telephone Encounter (Signed)
I spoke to daughter,  [name].  Pt has been having dizzines since 06/24/16.  Has not seen pcp.  We have not see for this.   I relayed since new problem to see pcp initially and if needs neuro to see, then to let us know.   Dizziness can be caused by a number of issues.  She stated that it is positional.  She verbalized understanding to see pcp,

## 2016-06-29 ENCOUNTER — Ambulatory Visit (INDEPENDENT_AMBULATORY_CARE_PROVIDER_SITE_OTHER): Payer: Commercial Managed Care - HMO | Admitting: Internal Medicine

## 2016-06-29 ENCOUNTER — Encounter: Payer: Self-pay | Admitting: Adult Health

## 2016-06-29 ENCOUNTER — Other Ambulatory Visit (INDEPENDENT_AMBULATORY_CARE_PROVIDER_SITE_OTHER): Payer: Commercial Managed Care - HMO

## 2016-06-29 ENCOUNTER — Ambulatory Visit (INDEPENDENT_AMBULATORY_CARE_PROVIDER_SITE_OTHER): Payer: Commercial Managed Care - HMO | Admitting: Adult Health

## 2016-06-29 ENCOUNTER — Encounter: Payer: Self-pay | Admitting: Internal Medicine

## 2016-06-29 VITALS — BP 130/70 | HR 74 | Temp 97.8°F | Resp 20 | Wt 150.0 lb

## 2016-06-29 VITALS — BP 162/88 | Ht 63.0 in | Wt 148.4 lb

## 2016-06-29 DIAGNOSIS — R42 Dizziness and giddiness: Secondary | ICD-10-CM | POA: Diagnosis not present

## 2016-06-29 DIAGNOSIS — R339 Retention of urine, unspecified: Secondary | ICD-10-CM | POA: Insufficient documentation

## 2016-06-29 DIAGNOSIS — I10 Essential (primary) hypertension: Secondary | ICD-10-CM | POA: Diagnosis not present

## 2016-06-29 DIAGNOSIS — R569 Unspecified convulsions: Secondary | ICD-10-CM

## 2016-06-29 DIAGNOSIS — F0390 Unspecified dementia without behavioral disturbance: Secondary | ICD-10-CM | POA: Diagnosis not present

## 2016-06-29 DIAGNOSIS — R300 Dysuria: Secondary | ICD-10-CM

## 2016-06-29 DIAGNOSIS — H6123 Impacted cerumen, bilateral: Secondary | ICD-10-CM

## 2016-06-29 DIAGNOSIS — R269 Unspecified abnormalities of gait and mobility: Secondary | ICD-10-CM | POA: Diagnosis not present

## 2016-06-29 MED ORDER — LEVETIRACETAM 750 MG PO TABS: 750.0000 mg | ORAL_TABLET | Freq: Two times a day (BID) | ORAL | 11 refills | Status: DC

## 2016-06-29 MED ORDER — TAMSULOSIN HCL 0.4 MG PO CAPS: 0.4000 mg | ORAL_CAPSULE | Freq: Every day | ORAL | 3 refills | Status: DC

## 2016-06-29 MED ORDER — MECLIZINE HCL 12.5 MG PO TABS: 12.5000 mg | ORAL_TABLET | Freq: Three times a day (TID) | ORAL | 1 refills | Status: DC | PRN

## 2016-06-29 NOTE — Progress Notes (Signed)
Subjective:    Patient ID: Teresa Riley, female    DOB: [birth year], 81 y.o.   MRN: [id number]  HPI  Here to f/u; overall doing ok,  Pt denies chest pain, increasing sob or doe, wheezing, orthopnea, PND, increased LE swelling, palpitations, dizziness or syncope.  Pt denies new neurological symptoms such as new headache, or facial or extremity weakness or numbness.  Pt denies polydipsia, polyuria, or low sugar episode.   Pt denies new neurological symptoms such as new headache, or facial or extremity weakness or numbness.   Pt states overall good compliance with meds, but has no active exercise due to more difficulty walking even with cane, seems to be high risk for fall without cane. Has been dizzy for 1 wk/spinning type, worse at night with lying down.  S/p siezure yesterday, seen per neurology and keppra increased to 750 bid.   Pt denies fever, wt loss, night sweats, loss of appetite, or other constitutional symptoms Denies urinary symptoms such as dysuria, frequency, urgency, flank pain, hematuria or n/v, fever, chills, but has significant urinary retention in the past month.  Also with bilat hearing loss - ? Wax impaction again, without HA, sinus pain, ST or cough.  Dementia overall stable symptomatically, and not assoc with behavioral changes such as hallucinations, paranoia, or agitation. Past Medical History:  Diagnosis Date  . Allergic rhinitis 09/05/2014  . Allergic rhinitis, cause unspecified 10/29/2010  . Chronic constipation 10/29/2010  . Eczema 10/29/2010  . Gait difficulty   . H/O: hysterectomy 10/29/2010  . History of cervical cancer 10/29/2010  . HTN (hypertension) 10/29/2010  . Hyperlipidemia   . Hypertension   . Impaired glucose tolerance 08/16/2012  . Loss of appetite   . Memory loss   . Orthostatic hypotension 06/05/2012  . Osteoporosis   . Right hip pain   . Seizures (HCC)   . Vascular dementia   . Vitamin D deficiency 09/05/2014   Past Surgical History:  Procedure  Laterality Date  . ABDOMINAL HYSTERECTOMY  1970  . BREAST BIOPSY  1980's   benign  . CATARACT EXTRACTION W/ INTRAOCULAR LENS  IMPLANT, BILATERAL  01/2011  . EYE SURGERY      reports that she quit smoking about 45 years ago. Her smoking use included Cigarettes. She has never used smokeless tobacco. She reports that she does not drink alcohol or use drugs. family history includes Diabetes in her father; High blood pressure in her daughter; Hypertension in her father and mother. No Known Allergies' Current Outpatient Prescriptions on File Prior to Visit  Medication Sig Dispense Refill  . albuterol (PROAIR HFA) 108 (90 Base) MCG/ACT inhaler Inhale 2 puffs into the lungs every 6 (six) hours as needed for wheezing or shortness of breath. 18 g 3  . ALPRAZolam (XANAX) 0.25 MG tablet TAKE 1 TABLET BY MOUTH 2 TIMES DAILY AS NEEDED 60 tablet 5  . aspirin EC 81 MG tablet Take 81 mg by mouth daily with breakfast.    . atorvastatin (LIPITOR) 10 MG tablet Take 1 tablet (10 mg total) by mouth daily. 90 tablet 3  . Calcium-Magnesium-Vitamin D (CALCIUM MAGNESIUM PO) Take 1 tablet by mouth daily.    . cycloSPORINE (RESTASIS) 0.05 % ophthalmic emulsion Place 1 drop into both eyes at bedtime.    Marland Kitchen. denosumab (PROLIA) 60 MG/ML SOLN injection Inject 60 mg into the skin every 6 (six) months. Administer in upper arm, thigh, or abdomen    . fluocinonide cream (LIDEX) 0.05 % Apply topically 2 (  two) times daily. 30 g 1  . fluticasone (FLONASE) 50 MCG/ACT nasal spray instill 1 spray into each nostril once daily 16 g 2  . labetalol (NORMODYNE) 300 MG tablet Take 1 tablet (300 mg total) by mouth every morning.    Marland Kitchen. lisinopril (PRINIVIL,ZESTRIL) 20 MG tablet Take 1 tablet (20 mg total) by mouth 2 (two) times daily. 60 tablet 11  . loratadine (CLARITIN) 10 MG tablet Take 10 mg by mouth daily.    . memantine (NAMENDA XR) 28 MG CP24 24 hr capsule Take 1 capsule (28 mg total) by mouth daily. 30 capsule 11  . Multiple Vitamin  (MULTIVITAMIN WITH MINERALS) TABS tablet Take 1 tablet by mouth daily.    . Omega-3 Fatty Acids (OMEGA 3 PO) Take 1 capsule by mouth daily.    Bertram Gala. Polyethyl Glycol-Propyl Glycol (SYSTANE OP) Place 2-4 drops into both eyes 4 (four) times daily as needed (for dry eyes).      No current facility-administered medications on file prior to visit.    Review of Systems  Constitutional: Negative for unusual diaphoresis or night sweats HENT: Negative for ear swelling or discharge Eyes: Negative for worsening visual haziness  Respiratory: Negative for choking and stridor.   Gastrointestinal: Negative for distension or worsening eructation Genitourinary: Negative for retention or change in urine volume.  Musculoskeletal: Negative for other MSK pain or swelling Skin: Negative for color change and worsening wound Neurological: Negative for tremors and numbness other than noted  Psychiatric/Behavioral: Negative for decreased concentration or agitation other than above   All other system neg per pt    Objective:   Physical Exam BP 130/70   Pulse 74   Temp 97.8 F (36.6 C) (Oral)   Resp 20   Wt 150 lb (68 kg)   SpO2 98%   BMI 26.57 kg/m  VS noted,  Constitutional: Pt appears in no apparent distress HENT: Head: NCAT.  Right Ear: External ear normal.  Left Ear: External ear normal.  bilat canals with wax impctions resolved with irrigation, hearing improved Eyes: . Pupils are equal, round, and reactive to light. Conjunctivae and EOM are normal Neck: Normal range of motion. Neck supple.  Cardiovascular: Normal rate and regular rhythm.   Pulmonary/Chest: Effort normal and breath sounds without rales or wheezing.  Abd:  Soft, NT, ND, + BS, no flank tender Neurological: Pt is alert. Not confused , motor 5/5 intact but generally weak, unsteady gait with risk of fall, cn 2-12 intact Skin: Skin is warm. No rash, no LE edema Psychiatric: Pt behavior is normal. No agitation.  No other new exam  findings  Transthoracic Echocardiography 04/23/2013 Study Conclusions  - Left ventricle: The cavity size was normal. Wall thickness was increased in a pattern of moderate LVH. Systolic function was vigorous. The estimated ejection fraction was in the range of 65% to 70%. Wall motion was normal; there were no regional wall motion abnormalities. There was an increased relative contribution of atrial contraction to ventricular filling. - Mitral valve: Mild regurgitation. - Left atrium: The atrium was mildly dilated. - Pulmonary arteries: PA peak pressure: 32mm Hg (S).    Assessment & Plan:

## 2016-06-29 NOTE — Progress Notes (Signed)
PATIENT: Teresa Riley DOB: 1935-01-22  REASON FOR VISIT: follow up HISTORY FROM: patient  HISTORY OF PRESENT ILLNESS: HISTORY per Dr. Zannie Cove notes: Teresa Riley, 81 year old female returns for followup. She is now living with her daughter Teresa Riley. She was referred by her primary care physician Dr. 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 MRI scan of the brain in 2014, shows extensive changes of chroniic microvascular ischemia and moderate degree of generalized cerebral atrophy and ventricular enlargement which appear slightly more advanced compared with previous MRI dated 06/03/2006. She began to have episodes of seizure-like event since 2013, she felt overheated, then become confused, blank stares, then slump down, body jerking movement,   For a while, she continue have recurrent episode on titrating dose of Keppra up to 1500 mg twice a day, Vimpat 100 mg twice a day, EEG in March 2016 showed bifrontal delta slowing, with right hemispheric predominance. This is consistent with FIRDA.  Upon further questioning, all episode happened in a standing position, she was on 4 agents treatment for her hypertension, lisinopril 40 mg in the morning, amlodipine 5 mg in the morning, hydrochlorothiazide 25 mg in the morning, atenolol 300 mg twice a day, all the episodes clustered in the morning time, shortly after she take her blood pressure medications.  I have decreased her blood pressure medication since May 2016, lisinopril 20 mg twice a day, amlodipine 5 mg every night, atenolol 300 mg every morning, stopped hydrochlorothiazide, and potassium supplement,  She no  longer has recurrent passing out episode, seizure-like event, doing very well, continue has mild memory trouble,   UPDATE May 27 2015:YY She is now taking Keppra 1000 mg twice a day, there was no recurrent seizure like activity, she is overall doing well, she continue have mild unsteady gait, right hip pain, ambulate with a cane, I will continue to decrease her Keppra to 500 mg twice a day,  UPDATE Aug 27 2015:YY She is overall doing very well, there was no recurrent passing out episode, she was put on lower dose Keppra 500 mg twice a day since November 2016, daughter did reported previously she had one typical episode consistent with complex partial seizure, sitting at Dr. Raphael Gibney office, had sudden loss of muscle tone, staring spells, followed by body jerking movement, lasting for few minutes, post event confusion, CURRENT DOSE OF KEPPRA 500 MG TWICE A DAY, Today her blood pressure is mildly elevated 150/90, I have advised her continue to document her blood pressure daily ANA was negative, no evidence of connective tissue disease  UPDATE 08/15/2017CM Teresa Riley, 81 year old female returns for follow-up with her daughter. She has a history of complex partial seizure disorder and is currently on Keppra 500 mg twice daily without further seizure activity. In addition she has memory loss. MRI scan of the brain in 2014, shows extensive changes of chroniic microvascular ischemia and moderate degree of generalized cerebral atrophy and ventricular enlargement which appear slightly more advanced compared with previous MRI dated 06/03/2006. She is currently on Namenda extended release 28 mg daily. She did not tolerate Aricept in the past due to having episodes of passing out. MMSE today is stable. She returns for reevaluation  Today 06/29/2016:  Teresa Riley is a 81 year old female with a history of seizures. She returns today for follow-up. She states that she had a seizure yesterday. She states that it was  unwitnessed. She states that she woke up on the floor. Her dentures had been thrown out of her mouth and were on the floor.. She did have urinary incontinence. She states that starting last week she started to have episodes of dizziness and lightheadedness. She noticed the dizziness when she would turn over in bed. She also noticed when she would look up to put her eyedrops in she started having dizziness- ascribed as a room spinning sensation and would only last for several seconds to minutes. She states that this is the first seizure she has had an over year. Denies missing any of her medication. States that she is sleeping well. Denies being sick. Denies any symptoms of a urinary tract infection. She returns today for an evaluation.  REVIEW OF SYSTEMS: Out of a complete 14 system review of symptoms, the patient complains only of the following symptoms, and all other reviewed systems are negative.  Incontinence of bladder, vomiting, frequent waking, eye pain, excessive sweating, dizziness, seizure  ALLERGIES: No Known Allergies  HOME MEDICATIONS: Outpatient Medications Prior to Visit  Medication Sig Dispense Refill  . albuterol (PROAIR HFA) 108 (90 Base) MCG/ACT inhaler Inhale 2 puffs into the lungs every 6 (six) hours as needed for wheezing or shortness of breath. 18 g 3  . ALPRAZolam (XANAX) 0.25 MG tablet TAKE 1 TABLET BY MOUTH 2 TIMES DAILY AS NEEDED 60 tablet 5  . aspirin EC 81 MG tablet Take 81 mg by mouth daily with breakfast.    . atorvastatin (LIPITOR) 10 MG tablet Take 1 tablet (10 mg total) by mouth daily. 90 tablet 3  . Calcium-Magnesium-Vitamin D (CALCIUM MAGNESIUM PO) Take 1 tablet by mouth daily.    . cycloSPORINE (RESTASIS) 0.05 % ophthalmic emulsion Place 1 drop into both eyes at bedtime.    Marland Kitchen. denosumab (PROLIA) 60 MG/ML SOLN injection Inject 60 mg into the skin every 6 (six) months. Administer in upper arm, thigh, or abdomen    . fluocinonide cream (LIDEX) 0.05 % Apply  topically 2 (two) times daily. 30 g 1  . fluticasone (FLONASE) 50 MCG/ACT nasal spray instill 1 spray into each nostril once daily 16 g 2  . labetalol (NORMODYNE) 300 MG tablet Take 1 tablet (300 mg total) by mouth every morning.    . levETIRAcetam (KEPPRA) 500 MG tablet Take 1 tablet (500 mg total) by mouth 2 (two) times daily. 60 tablet 11  . lisinopril (PRINIVIL,ZESTRIL) 20 MG tablet Take 1 tablet (20 mg total) by mouth 2 (two) times daily. 60 tablet 11  . loratadine (CLARITIN) 10 MG tablet Take 10 mg by mouth daily.    . memantine (NAMENDA XR) 28 MG CP24 24 hr capsule Take 1 capsule (28 mg total) by mouth daily. 30 capsule 11  . Multiple Vitamin (MULTIVITAMIN WITH MINERALS) TABS tablet Take 1 tablet by mouth daily.    . Omega-3 Fatty Acids (OMEGA 3 PO) Take 1 capsule by mouth daily.    Bertram Gala. Polyethyl Glycol-Propyl Glycol (SYSTANE OP) Place 2-4 drops into both eyes 4 (four) times daily as needed (for dry eyes).     Marland Kitchen. amLODipine (NORVASC) 5 MG tablet TAKE 1 TABLET BY MOUTH DAILY (Patient not taking: Reported on 06/29/2016) 90 tablet 3   No facility-administered medications prior to visit.     PAST MEDICAL  HISTORY: Past Medical History:  Diagnosis Date  . Allergic rhinitis 09/05/2014  . Allergic rhinitis, cause unspecified 10/29/2010  . Chronic constipation 10/29/2010  . Eczema 10/29/2010  . Gait difficulty   . H/O: hysterectomy 10/29/2010  . History of cervical cancer 10/29/2010  . HTN (hypertension) 10/29/2010  . Hyperlipidemia   . Hypertension   . Impaired glucose tolerance 08/16/2012  . Loss of appetite   . Memory loss   . Orthostatic hypotension 06/05/2012  . Osteoporosis   . Right hip pain   . Seizures (HCC)   . Vascular dementia   . Vitamin D deficiency 09/05/2014    PAST SURGICAL HISTORY: Past Surgical History:  Procedure Laterality Date  . ABDOMINAL HYSTERECTOMY  1970  . BREAST BIOPSY  1980's   benign  . CATARACT EXTRACTION W/ INTRAOCULAR LENS  IMPLANT, BILATERAL  01/2011  .  EYE SURGERY      FAMILY HISTORY: Family History  Problem Relation Age of Onset  . Hypertension Mother   . Hypertension Father   . Diabetes Father   . High blood pressure Daughter     x2  . Colon cancer Neg Hx   . Colon polyps Neg Hx   . Kidney disease Neg Hx   . Gallbladder disease Neg Hx     SOCIAL HISTORY: Social History   Social History  . Marital status: Divorced    Spouse name: N/A  . Number of children: 3  . Years of education: 2   Occupational History  . retired Runner, broadcasting/film/videoteacher    Social History Main Topics  . Smoking status: Former Smoker    Types: Cigarettes    Quit date: 07/13/1971  . Smokeless tobacco: Never Used     Comment: quit in 1973  . Alcohol use No  . Drug use: No  . Sexual activity: No   Other Topics Concern  . Not on file   Social History Narrative   Patient lives at home with two daughters Wadie Lessen(Teresa Riley) Anda Latina(Teresa Riley). Patient is retired. Patient has two years college.      PHYSICAL EXAM  Vitals:   06/29/16 0840  BP: (!) 162/88  Weight: 148 lb 6.4 oz (67.3 kg)  Height: 5\' 3"  (1.6 m)   Body mass index is 26.29 kg/m.  Generalized: Well developed, in no acute distress  HEENT: Ear canals are blocked by cerumen bilaterally. Unable to visualize the tympanic membrane.  Neurological examination  Mentation: Alert oriented to time, place, history taking. Follows all commands speech and language fluent Cranial nerve II-XII: Pupils were equal round reactive to light. Extraocular movements were full, visual field were full on confrontational test. In the nystagmus noted with horizontal gaze to the right. Facial sensation and strength were normal. Uvula tongue midline. Head turning and shoulder shrug  were normal and symmetric. Motor: The motor testing reveals 5 over 5 strength of all 4 extremities. Good symmetric motor tone is noted throughout.  Sensory: Sensory testing is intact to soft touch on all 4 extremities. No evidence of extinction is  noted.  Coordination: Cerebellar testing reveals good finger-nose-finger and heel-to-shin bilaterally.  Gait and station: Gait is normal. Tandem gait is slightly unsteady.. Romberg is negative. No drift is seen.  Reflexes: Deep tendon reflexes are symmetric and normal bilaterally.   DIAGNOSTIC DATA (LABS, IMAGING, TESTING) - I reviewed patient records, labs, notes, testing and imaging myself where available.  Lab Results  Component Value Date   WBC 7.4 09/16/2015   HGB 12.9 09/16/2015  HCT 38.2 09/16/2015   MCV 94.3 09/16/2015   PLT 249.0 09/16/2015      Component Value Date/Time   NA 142 09/16/2015 1554   K 3.7 09/16/2015 1554   CL 103 09/16/2015 1554   CO2 29 09/16/2015 1554   GLUCOSE 127 (H) 09/16/2015 1554   BUN 14 09/16/2015 1554   CREATININE 1.19 09/16/2015 1554   CALCIUM 10.0 09/16/2015 1554   PROT 7.9 09/16/2015 1554   ALBUMIN 4.3 09/16/2015 1554   AST 26 09/16/2015 1554   ALT 19 09/16/2015 1554   ALKPHOS 63 09/16/2015 1554   BILITOT 0.4 09/16/2015 1554   GFRNONAA 43 (L) 06/23/2014 1214   GFRAA 50 (L) 06/23/2014 1214   Lab Results  Component Value Date   CHOL 197 09/16/2015   HDL 68.50 09/16/2015   LDLCALC 92 09/05/2014   LDLDIRECT 93.0 09/16/2015   TRIG 244.0 (H) 09/16/2015   CHOLHDL 3 09/16/2015   Lab Results  Component Value Date   HGBA1C 6.0 03/11/2014   Lab Results  Component Value Date   VITAMINB12 924 (H) 10/02/2013   Lab Results  Component Value Date   TSH 0.89 09/16/2015      ASSESSMENT AND PLAN 81 y.o. year old female  has a past medical history of Allergic rhinitis (09/05/2014); Allergic rhinitis, cause unspecified (10/29/2010); Chronic constipation (10/29/2010); Eczema (10/29/2010); Gait difficulty; H/O: hysterectomy (10/29/2010); History of cervical cancer (10/29/2010); HTN (hypertension) (10/29/2010); Hyperlipidemia; Hypertension; Impaired glucose tolerance (08/16/2012); Loss of appetite; Memory loss; Orthostatic hypotension (06/05/2012);  Osteoporosis; Right hip pain; Seizures (HCC); Vascular dementia; and Vitamin D deficiency (09/05/2014). here with:  1. Seizure 2. Dizziness  The patient's Keppra will be increased to 750 mg twice a day. I will also check a urinalysis today to rule out acute infection. The patient does have cerumen buildup in both ear canals. She is advised to follow-up with her primary care to have her ears cleaned. If she continues to have dizziness we can refer the patient for vestibular rehabilitation. Based off of her history it appears that she may have positional vertigo. She will follow-up in 2-3 months or sooner if needed.     Butch Penny, MSN, NP-C 06/29/2016, 8:47 AM [facility] [address] [place], [place] [address] [phone]

## 2016-06-29 NOTE — Patient Instructions (Signed)
Please take all new medication as prescribed - the flomax daily  Please call if not improved, for urology referral  Please take all new medication as prescribed - the meclizine as needed for dizziness  Your ears were irrigated of wax today  Please continue all other medications as before, and refills have been done if requested.  Please have the pharmacy call with any other refills you may need.  Please continue your efforts at being more active, low cholesterol diet, and weight control.  You will be contacted regarding the referral for: Home Health RN and PT  Please keep your appointments with your specialists as you may have planned  Please go to the LAB in the Basement (turn left off the elevator) for the tests to be done today - just the urine testing today  You will be contacted by phone if any changes need to be made immediately.  Otherwise, you will receive a letter about your results with an explanation, but please check with MyChart first.  Please return in 3 months, or sooner if needed, with Lab testing done 3-5 days before

## 2016-06-29 NOTE — Progress Notes (Signed)
Pre visit review using our clinic review tool, if applicable. No additional management support is needed unless otherwise documented below in the visit note. 

## 2016-06-29 NOTE — Patient Instructions (Signed)
Increase Keppra to 750 mg twice a day See PCP about wax in the ears If dizziness does not improve we can refer for vestibular rehab If your symptoms worsen or you develop new symptoms please let us know.

## 2016-06-29 NOTE — Progress Notes (Signed)
I have reviewed and agreed above plan. 

## 2016-06-30 LAB — URINALYSIS, ROUTINE W REFLEX MICROSCOPIC
Bilirubin Urine: NEGATIVE
Hgb urine dipstick: NEGATIVE
Ketones, ur: NEGATIVE
Leukocytes, UA: NEGATIVE
NITRITE: NEGATIVE
SPECIFIC GRAVITY, URINE: 1.025 (ref 1.000–1.030)
Total Protein, Urine: NEGATIVE
URINE GLUCOSE: NEGATIVE
Urobilinogen, UA: 0.2 (ref 0.0–1.0)
pH: 6 (ref 5.0–8.0)

## 2016-07-04 DIAGNOSIS — R42 Dizziness and giddiness: Secondary | ICD-10-CM | POA: Insufficient documentation

## 2016-07-04 NOTE — Assessment & Plan Note (Signed)
stable overall by history and exam, recent data reviewed with pt, and pt to continue medical treatment as before,  to f/u any worsening symptoms or concerns Lab Results  Component Value Date   WBC 7.4 09/16/2015   HGB 12.9 09/16/2015   HCT 38.2 09/16/2015   PLT 249.0 09/16/2015   GLUCOSE 127 (H) 09/16/2015   CHOL 197 09/16/2015   TRIG 244.0 (H) 09/16/2015   HDL 68.50 09/16/2015   LDLDIRECT 93.0 09/16/2015   LDLCALC 92 09/05/2014   ALT 19 09/16/2015   AST 26 09/16/2015   NA 142 09/16/2015   K 3.7 09/16/2015   CL 103 09/16/2015   CREATININE 1.19 09/16/2015   BUN 14 09/16/2015   CO2 29 09/16/2015   TSH 0.89 09/16/2015   HGBA1C 6.0 03/11/2014

## 2016-07-04 NOTE — Assessment & Plan Note (Signed)
stable overall by history and exam, recent data reviewed with pt, and pt to continue medical treatment as before,  to f/u any worsening symptoms or concerns BP Readings from Last 3 Encounters:  06/29/16 130/70  06/29/16 (!) 162/88  02/24/16 (!) 156/85

## 2016-07-04 NOTE — Assessment & Plan Note (Signed)
Ok for meclizine prn,  to f/u any worsening symptoms or concerns 

## 2016-07-04 NOTE — Assessment & Plan Note (Signed)
Worsening now mild to mod even with cane use, has general weakness, [facility] if [facility]

## 2016-07-04 NOTE — Assessment & Plan Note (Signed)
?   Etiology, for ua and cx, flomax trial, declines urology referral

## 2016-07-09 DIAGNOSIS — H40013 Open angle with borderline findings, low risk, bilateral: Secondary | ICD-10-CM | POA: Diagnosis not present

## 2016-07-09 DIAGNOSIS — H01003 Unspecified blepharitis right eye, unspecified eyelid: Secondary | ICD-10-CM | POA: Diagnosis not present

## 2016-07-09 DIAGNOSIS — H04123 Dry eye syndrome of bilateral lacrimal glands: Secondary | ICD-10-CM | POA: Diagnosis not present

## 2016-07-09 DIAGNOSIS — H35363 Drusen (degenerative) of macula, bilateral: Secondary | ICD-10-CM | POA: Diagnosis not present

## 2016-07-17 ENCOUNTER — Other Ambulatory Visit: Payer: Self-pay | Admitting: Internal Medicine

## 2016-07-17 DIAGNOSIS — R42 Dizziness and giddiness: Secondary | ICD-10-CM

## 2016-08-20 DIAGNOSIS — H04123 Dry eye syndrome of bilateral lacrimal glands: Secondary | ICD-10-CM | POA: Diagnosis not present

## 2016-08-20 DIAGNOSIS — H01003 Unspecified blepharitis right eye, unspecified eyelid: Secondary | ICD-10-CM | POA: Diagnosis not present

## 2016-08-26 ENCOUNTER — Ambulatory Visit: Payer: Commercial Managed Care - HMO | Admitting: Nurse Practitioner

## 2016-09-22 ENCOUNTER — Telehealth: Payer: Self-pay | Admitting: Internal Medicine

## 2016-09-22 NOTE — Telephone Encounter (Signed)
Teresa Riley, daughter, called request to speak to Teresa Riley about Prolia program. Pt cancelled appt for 09/23/16 because they want to get Prolia injection on same day as CPE. Teresa Riley said the assistant program for prolia ended on 2/14 and we never call and make an appt for her mother to come in and get the shot. Please give her a call back today if possible.

## 2016-09-22 NOTE — Telephone Encounter (Signed)
I have called and advised Teresa Riley,daughter that assistance program with funding at the moment--is PANF---[phone]----they only use BIN numbers for payment---daughter is going to call to see if she can get reimbursed for copay using BIN # should her mom qualify for assistance funding---our office  Cannot accept BIN numbers as payment---daughter to call back and schedule appt for nurse visit (prolia injection) after she enrolls mother in program---can talk with tamara if any questions

## 2016-09-23 ENCOUNTER — Other Ambulatory Visit: Payer: Self-pay | Admitting: Internal Medicine

## 2016-09-23 ENCOUNTER — Encounter: Payer: Commercial Managed Care - HMO | Admitting: Internal Medicine

## 2016-09-23 NOTE — Telephone Encounter (Signed)
Done hardcopy to Anna  

## 2016-09-24 NOTE — Telephone Encounter (Signed)
Script was faxed back to [facility] on 3/15...Raechel Chute/lmb

## 2016-09-27 ENCOUNTER — Encounter: Payer: Self-pay | Admitting: Neurology

## 2016-09-27 ENCOUNTER — Ambulatory Visit (INDEPENDENT_AMBULATORY_CARE_PROVIDER_SITE_OTHER): Payer: Medicare HMO | Admitting: Neurology

## 2016-09-27 ENCOUNTER — Ambulatory Visit: Payer: Commercial Managed Care - HMO | Admitting: Adult Health

## 2016-09-27 VITALS — BP 157/97 | HR 92 | Ht 63.0 in | Wt 148.5 lb

## 2016-09-27 DIAGNOSIS — F0390 Unspecified dementia without behavioral disturbance: Secondary | ICD-10-CM | POA: Diagnosis not present

## 2016-09-27 DIAGNOSIS — E559 Vitamin D deficiency, unspecified: Secondary | ICD-10-CM | POA: Diagnosis not present

## 2016-09-27 DIAGNOSIS — R202 Paresthesia of skin: Secondary | ICD-10-CM | POA: Diagnosis not present

## 2016-09-27 DIAGNOSIS — R55 Syncope and collapse: Secondary | ICD-10-CM

## 2016-09-27 DIAGNOSIS — R260 Ataxic gait: Secondary | ICD-10-CM | POA: Diagnosis not present

## 2016-09-27 DIAGNOSIS — G308 Other Alzheimer's disease: Secondary | ICD-10-CM | POA: Diagnosis not present

## 2016-09-27 DIAGNOSIS — R799 Abnormal finding of blood chemistry, unspecified: Secondary | ICD-10-CM | POA: Diagnosis not present

## 2016-09-27 MED ORDER — MEMANTINE HCL-DONEPEZIL HCL ER 28-10 MG PO CP24: 1.0000 | ORAL_CAPSULE | Freq: Every day | ORAL | 11 refills | Status: DC

## 2016-09-27 NOTE — Progress Notes (Signed)
PATIENT: Teresa Riley DOB: 1935-04-03   HISTORY OF PRESENT ILLNESS:  She was referred by her primary care physician Dr. 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 MRI scan of the brain in 2014, shows extensive changes of chroniic microvascular ischemia and moderate degree of generalized cerebral atrophy and ventricular enlargement which appear slightly more advanced compared with previous MRI dated 06/03/2006. She began to have episodes of seizure-like event since 2013, she felt overheated, then become confused, blank stares, then slump down, body jerking movement,   For a while, she continue have recurrent episode on titrating dose of Keppra up to 1500 mg twice a day, Vimpat 100 mg twice a day, EEG in March 2016 showed bifrontal delta slowing, with right hemispheric predominance. This is consistent with FIRDA.  Upon further questioning, all episode happened in a standing position, she was on 4 agents treatment for her hypertension, lisinopril 40 mg in the morning, amlodipine 5 mg in the morning, hydrochlorothiazide 25 mg in the morning, atenolol 300 mg twice a day, all the episodes clustered in the morning time, shortly after she take her blood pressure medications.  I have decreased her blood pressure medication since May 2016, lisinopril 20 mg twice a day, amlodipine 5 mg every night, atenolol 300 mg every morning, stopped hydrochlorothiazide, and potassium supplement,  She no longer has recurrent passing out episode, seizure-like event, doing very well, continue has mild memory trouble,   UPDATE May 27 2015: She is now taking Keppra 1000 mg twice a  day, there was no recurrent seizure like activity, she is overall doing well, she continue have mild unsteady gait, right hip pain, ambulate with a cane, I will continue to decrease her Keppra to 500 mg twice a day,  UPDATE Aug 27 2015:YY She is overall doing very well, there was no recurrent passing out episode, she was put on lower dose Keppra 500 mg twice a day since November 2016, daughter did reported previously she had one typical episode consistent with complex partial seizure, sitting at Dr. Raphael Gibney office, had sudden loss of muscle tone, staring spells, followed by body jerking movement, lasting for few minutes, post event confusion,  Today her blood pressure is mildly elevated 150/90, I have advised her continue to document her blood pressure daily ANA was negative, no evidence of connective tissue disease  Update September 27 2016. Last visit was on June 29 7016 with nurse practitioner Aundra Millet, she reported woke up on the floor the day prior on June 28 2016, she was sitting at the edge of the bed, dizziness, next thing she woke up, her denture has been throughout of her mouth, she had urinary incontinence, she began to have recurrent episode of dizziness, lightheadedness again, also complains of dizziness when she turning over in bed,  She has worsening memory loss, Mini-Mental Status Examination 27/30, imaging of 8  We have personally reviewed MRI of the brain in 2015, significant generalized atrophy, supratentorium small vessel disease, she denies a family history of memory loss,  Today we will able to show significant orthostatic blood pressure changes, lying down blood pressure 104/96, 69, standing up 159/88, 73, 5 minutes standing up 163/92, 69  She is on polypharmacy for blood pressure treatment, atenolol 300 mg every morning, Norvasc 5 mg every night, lisinopril 20 mg every night  She also complains of bilateral feet paresthesia, mild length dependent sensory changes  REVIEW  OF SYSTEMS: Out of a complete 14 system review of symptoms, the patient complains only of the following symptoms, and all other reviewed systems are negative.  Appetite change, expected weight change, eye pain, shortness of breath, frequent wakening, memory loss  ALLERGIES: No Known Allergies  HOME MEDICATIONS: Outpatient Medications Prior to Visit  Medication Sig Dispense Refill  . albuterol (PROAIR HFA) 108 (90 Base) MCG/ACT inhaler Inhale 2 puffs into the lungs every 6 (six) hours as needed for wheezing or shortness of breath. 18 g 3  . ALPRAZolam (XANAX) 0.25 MG tablet TAKE 1 TABLET BY MOUTH 2 TIMES DAILY AS NEEDED 60 tablet 5  . amLODipine (NORVASC) 5 MG tablet     . aspirin EC 81 MG tablet Take 81 mg by mouth daily with breakfast.    . atorvastatin (LIPITOR) 10 MG tablet Take 1 tablet (10 mg total) by mouth daily. 90 tablet 3  . Calcium-Magnesium-Vitamin D (CALCIUM MAGNESIUM PO) Take 1 tablet by mouth daily.    . cycloSPORINE (RESTASIS) 0.05 % ophthalmic emulsion Place 1 drop into both eyes at bedtime.    Marland Kitchen denosumab (PROLIA) 60 MG/ML SOLN injection Inject 60 mg into the skin every 6 (six) months. Administer in upper arm, thigh, or abdomen    . fluocinonide cream (LIDEX) 0.05 % Apply topically 2 (two) times daily. 30 g 1  . fluticasone (FLONASE) 50 MCG/ACT nasal spray instill 1 spray into each nostril once daily 16 g 2  . labetalol (NORMODYNE) 300 MG tablet Take 1 tablet (300 mg total) by mouth 2 (two) times daily. Yearly physical w/labs due in March must see MD for refills 180 tablet 0  . levETIRAcetam (KEPPRA) 750 MG tablet Take 1 tablet (750 mg total) by mouth 2 (two) times daily. 60 tablet 11  . lisinopril (PRINIVIL,ZESTRIL) 20 MG tablet Take 1 tablet (20 mg total) by mouth 2 (two) times daily. 60 tablet 11  . loratadine (CLARITIN) 10 MG tablet Take 10 mg by mouth daily.    . meclizine (ANTIVERT) 12.5 MG tablet TAKE 1 TABLET BY MOUTH 3 TIMES DAILY AS NEEDED FOR DIZZINESS 30 tablet 2   . memantine (NAMENDA XR) 28 MG CP24 24 hr capsule Take 1 capsule (28 mg total) by mouth daily. 30 capsule 11  . Multiple Vitamin (MULTIVITAMIN WITH MINERALS) TABS tablet Take 1 tablet by mouth daily.    . Omega-3 Fatty Acids (OMEGA 3 PO) Take 1 capsule by mouth daily.    Bertram Gala Glycol-Propyl Glycol (SYSTANE OP) Place 2-4 drops into both eyes 4 (four) times daily as needed (for dry eyes).     . tamsulosin (FLOMAX) 0.4 MG CAPS capsule Take 1 capsule (0.4 mg total) by mouth daily. 90 capsule 3  . labetalol (NORMODYNE) 300 MG tablet Take 1 tablet (300 mg total) by mouth every morning.     No facility-administered medications prior to visit.     PAST MEDICAL HISTORY: Past Medical History:  Diagnosis Date  . Allergic rhinitis 09/05/2014  . Allergic rhinitis, cause unspecified 10/29/2010  . Chronic constipation 10/29/2010  . Eczema 10/29/2010  . Gait difficulty   . H/O: hysterectomy 10/29/2010  . History of cervical cancer 10/29/2010  . HTN (hypertension) 10/29/2010  . Hyperlipidemia   . Hypertension   . Impaired  glucose tolerance 08/16/2012  . Loss of appetite   . Memory loss   . Orthostatic hypotension 06/05/2012  . Osteoporosis   . Right hip pain   . Seizures (HCC)   . Vascular dementia   . Vitamin D deficiency 09/05/2014    PAST SURGICAL HISTORY: Past Surgical History:  Procedure Laterality Date  . ABDOMINAL HYSTERECTOMY  1970  . BREAST BIOPSY  1980's   benign  . CATARACT EXTRACTION W/ INTRAOCULAR LENS  IMPLANT, BILATERAL  01/2011  . EYE SURGERY      FAMILY HISTORY: Family History  Problem Relation Age of Onset  . Hypertension Mother   . Hypertension Father   . Diabetes Father   . High blood pressure Daughter     x2  . Colon cancer Neg Hx   . Colon polyps Neg Hx   . Kidney disease Neg Hx   . Gallbladder disease Neg Hx     SOCIAL HISTORY: Social History   Social History  . Marital status: Divorced    Spouse name: N/A  . Number of children: 3  . Years of  education: 2   Occupational History  . retired Runner, broadcasting/film/video    Social History Main Topics  . Smoking status: Former Smoker    Types: Cigarettes    Quit date: 07/13/1971  . Smokeless tobacco: Never Used     Comment: quit in 1973  . Alcohol use No  . Drug use: No  . Sexual activity: No   Other Topics Concern  . Not on file   Social History Narrative   Patient lives at home with two daughters Wadie Lessen) Anda Latina). Patient is retired. Patient has two years college.      PHYSICAL EXAM  Vitals:   09/27/16 1553  BP: (!) 157/97  Pulse: 92  Weight: 148 lb 8 oz (67.4 kg)  Height: 5\' 3"  (1.6 m)   Body mass index is 26.31 kg/m.  PHYSICAL EXAMNIATION:  Gen: NAD, conversant, well nourised, obese, well groomed                     Cardiovascular: Regular rate rhythm, no peripheral edema, warm, nontender. Eyes: Conjunctivae clear without exudates or hemorrhage Neck: Supple, no carotid bruits. Pulmonary: Clear to auscultation bilaterally   NEUROLOGICAL EXAM:  MENTAL STATUS: Speech:    Speech is normal; fluent and spontaneous with normal comprehension.  Cognition:     Orientation to time, place and person     Normal recent and remote memory     Normal Attention span and concentration     Normal Language, naming, repeating,spontaneous speech     Fund of knowledge   CRANIAL NERVES: CN II: Visual fields are full to confrontation. Fundoscopic exam is normal with sharp discs and no vascular changes. Pupils are round equal and briskly reactive to light. CN III, IV, VI: extraocular movement are normal. No ptosis. CN V: Facial sensation is intact to pinprick in all 3 divisions bilaterally. Corneal responses are intact.  CN VII: Face is symmetric with normal eye closure and smile. CN VIII: Hearing is normal to rubbing fingers CN IX, X: Palate elevates symmetrically. Phonation is normal. CN XI: Head turning and shoulder shrug are intact CN XII: Tongue is midline with normal  movements and no atrophy.  MOTOR: There is no pronator drift of out-stretched arms. Muscle bulk and tone are normal. Muscle strength is normal.  REFLEXES: Reflexes are 1 and symmetric at the biceps, triceps, knees, and ankles. Plantar  responses are flexor.  SENSORY: Length dependent decreased to to light touch, pinprick at bilateral ankle level  COORDINATION: Rapid alternating movements and fine finger movements are intact. There is no dysmetria on finger-to-nose and heel-knee-shin.    GAIT/STANCE: Need to push up to get up from seated position, wide based, cautious Romberg is absent.   DIAGNOSTIC DATA (LABS, IMAGING, TESTING) - I reviewed patient records, labs, notes, testing and imaging myself where available.  Lab Results  Component Value Date   WBC 7.4 09/16/2015   HGB 12.9 09/16/2015   HCT 38.2 09/16/2015   MCV 94.3 09/16/2015   PLT 249.0 09/16/2015      Component Value Date/Time   NA 142 09/16/2015 1554   K 3.7 09/16/2015 1554   CL 103 09/16/2015 1554   CO2 29 09/16/2015 1554   GLUCOSE 127 (H) 09/16/2015 1554   BUN 14 09/16/2015 1554   CREATININE 1.19 09/16/2015 1554   CALCIUM 10.0 09/16/2015 1554   PROT 7.9 09/16/2015 1554   ALBUMIN 4.3 09/16/2015 1554   AST 26 09/16/2015 1554   ALT 19 09/16/2015 1554   ALKPHOS 63 09/16/2015 1554   BILITOT 0.4 09/16/2015 1554   GFRNONAA 43 (L) 06/23/2014 1214   GFRAA 50 (L) 06/23/2014 1214   Lab Results  Component Value Date   CHOL 197 09/16/2015   HDL 68.50 09/16/2015   LDLCALC 92 09/05/2014   LDLDIRECT 93.0 09/16/2015   TRIG 244.0 (H) 09/16/2015   CHOLHDL 3 09/16/2015   Lab Results  Component Value Date   HGBA1C 6.0 03/11/2014   Lab Results  Component Value Date   VITAMINB12 924 (H) 10/02/2013   Lab Results  Component Value Date   TSH 0.89 09/16/2015      ASSESSMENT AND PLAN 82 y.o. year old female   Passing out episodes  Most suggestive orthostatic blood pressure changes, previously she had good  response with adjustment of medications, and continue recurrent spells with titrating dose of multiple antiepileptic  Significant orthostatic blood pressure changes detailed above  Keep well hydration  Change atenolol 300 mg to evening time Paresthesia, length dependent sensory changes  Laboratory evaluation for etiology of peripheral neuropathy   EMG nerve conduction study Memory loss  Significant brain atrophy, periventricular small vessel disease  Continue to address vascular risk factor  Changed Namzaric one tab daily.   Levert FeinsteinYijun Antino Mayabb, M.D. Ph.D.  [facility] [address] [place], [place] [address] Phone: [phone] Fax:      [phone]

## 2016-09-28 ENCOUNTER — Ambulatory Visit: Payer: Commercial Managed Care - HMO | Admitting: Internal Medicine

## 2016-09-29 LAB — COMPREHENSIVE METABOLIC PANEL
ALBUMIN: 4.3 g/dL (ref 3.5–4.8)
ALK PHOS: 52 IU/L (ref 39–117)
ALT: 11 IU/L (ref 0–32)
AST: 26 IU/L (ref 0–40)
Albumin/Globulin Ratio: 1.3 (ref 1.2–2.2)
BILIRUBIN TOTAL: 0.4 mg/dL (ref 0.0–1.2)
BUN / CREAT RATIO: 10 — AB (ref 12–28)
BUN: 13 mg/dL (ref 8–27)
CHLORIDE: 100 mmol/L (ref 96–106)
CO2: 26 mmol/L (ref 18–29)
Calcium: 10.1 mg/dL (ref 8.7–10.3)
Creatinine, Ser: 1.36 mg/dL — ABNORMAL HIGH (ref 0.57–1.00)
GFR calc non Af Amer: 37 mL/min/{1.73_m2} — ABNORMAL LOW (ref >59)
GFR, EST AFRICAN AMERICAN: 43 mL/min/{1.73_m2} — AB (ref >59)
GLOBULIN, TOTAL: 3.4 g/dL (ref 1.5–4.5)
Glucose: 96 mg/dL (ref 65–99)
Potassium: 4 mmol/L (ref 3.5–5.2)
SODIUM: 143 mmol/L (ref 134–144)
Total Protein: 7.7 g/dL (ref 6.0–8.5)

## 2016-09-29 LAB — PROTEIN ELECTROPHORESIS
A/G Ratio: 1.1 (ref 0.7–1.7)
ALPHA 1: 0.2 g/dL (ref 0.0–0.4)
Albumin ELP: 4 g/dL (ref 2.9–4.4)
Alpha 2: 0.8 g/dL (ref 0.4–1.0)
Beta: 1.1 g/dL (ref 0.7–1.3)
GAMMA GLOBULIN: 1.6 g/dL (ref 0.4–1.8)
GLOBULIN, TOTAL: 3.7 g/dL (ref 2.2–3.9)

## 2016-09-29 LAB — CK: CK TOTAL: 161 U/L (ref 24–173)

## 2016-09-29 LAB — RPR: RPR: NONREACTIVE

## 2016-09-29 LAB — ANA W/REFLEX IF POSITIVE: Anti Nuclear Antibody(ANA): NEGATIVE

## 2016-09-29 LAB — VITAMIN D 25 HYDROXY (VIT D DEFICIENCY, FRACTURES): Vit D, 25-Hydroxy: 59.5 ng/mL (ref 30.0–100.0)

## 2016-09-29 LAB — VITAMIN B12: VITAMIN B 12: 1750 pg/mL — AB (ref 232–1245)

## 2016-09-29 LAB — SEDIMENTATION RATE: Sed Rate: 30 mm/hr (ref 0–40)

## 2016-09-29 LAB — C-REACTIVE PROTEIN: CRP: 2.1 mg/L (ref 0.0–4.9)

## 2016-09-29 LAB — TSH: TSH: 1.98 u[IU]/mL (ref 0.450–4.500)

## 2016-09-29 LAB — FOLATE

## 2016-09-29 LAB — HGB A1C W/O EAG: Hgb A1c MFr Bld: 5.5 % (ref 4.8–5.6)

## 2016-09-29 LAB — COPPER, SERUM: Copper: 97 ug/dL (ref 72–166)

## 2016-10-06 ENCOUNTER — Ambulatory Visit (INDEPENDENT_AMBULATORY_CARE_PROVIDER_SITE_OTHER): Payer: Medicare HMO | Admitting: Internal Medicine

## 2016-10-06 ENCOUNTER — Other Ambulatory Visit: Payer: Self-pay | Admitting: *Deleted

## 2016-10-06 ENCOUNTER — Encounter: Payer: Self-pay | Admitting: Internal Medicine

## 2016-10-06 ENCOUNTER — Telehealth: Payer: Self-pay | Admitting: Neurology

## 2016-10-06 VITALS — BP 162/92 | HR 75 | Temp 97.7°F | Resp 16 | Ht 63.0 in | Wt 146.0 lb

## 2016-10-06 DIAGNOSIS — I10 Essential (primary) hypertension: Secondary | ICD-10-CM | POA: Diagnosis not present

## 2016-10-06 DIAGNOSIS — M810 Age-related osteoporosis without current pathological fracture: Secondary | ICD-10-CM | POA: Diagnosis not present

## 2016-10-06 DIAGNOSIS — R634 Abnormal weight loss: Secondary | ICD-10-CM

## 2016-10-06 MED ORDER — DENOSUMAB 60 MG/ML ~~LOC~~ SOLN
60.0000 mg | Freq: Once | SUBCUTANEOUS | Status: AC
  Administered 2016-10-06: 60 mg via SUBCUTANEOUS

## 2016-10-06 NOTE — Addendum Note (Signed)
Addended by: Mercer PodWRENN, Sakeenah Valcarcel E on: 10/06/2016 04:58 PM   Modules accepted: Orders

## 2016-10-06 NOTE — Telephone Encounter (Signed)
[place]'s daughter ([place] [name] called says pt is having vivid dreams since being on Memantine HCl-Donepezil HCl (NAMZARIC) 28-10 MG CP24. Pt does not want to take it anymore, is there something else she could try or go back to reg namenda.

## 2016-10-06 NOTE — Patient Instructions (Addendum)
Please continue all other medications as before, and refills have been done if requested.  Please have the pharmacy call with any other refills you may need.  Please keep your appointments with your specialists as you may have planned  You will be contacted regarding the referral for: Nutrition  We should talk to Delaney Meigsamara in the office today about the prolia  Please return in 6 months, or sooner if needed, with Lab testing done 3-5 days before

## 2016-10-06 NOTE — Progress Notes (Signed)
Subjective:    Patient ID: Teresa Riley, female    DOB: Jan 31, 1935, 82 y.o.   MRN: [id number]  HPI Here to f/u; overall doing ok,  Pt denies chest pain, increasing sob or doe, wheezing, orthopnea, PND, increased LE swelling, palpitations, dizziness or syncope.  Pt denies new neurological symptoms such as new headache, or facial or extremity weakness or numbness.  Pt denies polydipsia, polyuria, or low sugar episode.   Pt denies new neurological symptoms such as new headache, or facial or extremity weakness or numbness.   Pt states overall good compliance with meds, mostly trying to follow appropriate diet, with wt overall stable,  but little exercise however.  BP at home usually 140-150, sometimes 130's.  Does not want change today Past Medical History:  Diagnosis Date  . Allergic rhinitis 09/05/2014  . Allergic rhinitis, cause unspecified 10/29/2010  . Chronic constipation 10/29/2010  . Eczema 10/29/2010  . Gait difficulty   . H/O: hysterectomy 10/29/2010  . History of cervical cancer 10/29/2010  . HTN (hypertension) 10/29/2010  . Hyperlipidemia   . Hypertension   . Impaired glucose tolerance 08/16/2012  . Loss of appetite   . Memory loss   . Orthostatic hypotension 06/05/2012  . Osteoporosis   . Right hip pain   . Seizures (HCC)   . Vascular dementia   . Vitamin D deficiency 09/05/2014   Past Surgical History:  Procedure Laterality Date  . ABDOMINAL HYSTERECTOMY  1970  . BREAST BIOPSY  1980's   benign  . CATARACT EXTRACTION W/ INTRAOCULAR LENS  IMPLANT, BILATERAL  01/2011  . EYE SURGERY      reports that she quit smoking about 45 years ago. Her smoking use included Cigarettes. She has never used smokeless tobacco. She reports that she does not drink alcohol or use drugs. family history includes Diabetes in her father; High blood pressure in her daughter; Hypertension in her father and mother. No Known Allergies Current Outpatient Prescriptions on File Prior to Visit  Medication Sig  Dispense Refill  . albuterol (PROAIR HFA) 108 (90 Base) MCG/ACT inhaler Inhale 2 puffs into the lungs every 6 (six) hours as needed for wheezing or shortness of breath. 18 g 3  . ALPRAZolam (XANAX) 0.25 MG tablet TAKE 1 TABLET BY MOUTH 2 TIMES DAILY AS NEEDED 60 tablet 5  . amLODipine (NORVASC) 5 MG tablet Take 5 mg by mouth at bedtime.     Marland Kitchen. aspirin EC 81 MG tablet Take 81 mg by mouth daily with breakfast.    . atorvastatin (LIPITOR) 10 MG tablet Take 1 tablet (10 mg total) by mouth daily. 90 tablet 3  . Calcium-Magnesium-Vitamin D (CALCIUM MAGNESIUM PO) Take 1 tablet by mouth daily.    . cycloSPORINE (RESTASIS) 0.05 % ophthalmic emulsion Place 1 drop into both eyes at bedtime.    Marland Kitchen. denosumab (PROLIA) 60 MG/ML SOLN injection Inject 60 mg into the skin every 6 (six) months. Administer in upper arm, thigh, or abdomen    . fluocinonide cream (LIDEX) 0.05 % Apply topically 2 (two) times daily. 30 g 1  . fluticasone (FLONASE) 50 MCG/ACT nasal spray instill 1 spray into each nostril once daily 16 g 2  . labetalol (NORMODYNE) 300 MG tablet Take 1 tablet (300 mg total) by mouth 2 (two) times daily. Yearly physical w/labs due in March must see MD for refills 180 tablet 0  . levETIRAcetam (KEPPRA) 750 MG tablet Take 1 tablet (750 mg total) by mouth 2 (two) times daily. 60  tablet 11  . lisinopril (PRINIVIL,ZESTRIL) 20 MG tablet Take 1 tablet (20 mg total) by mouth 2 (two) times daily. (Patient taking differently: Take 20 mg by mouth at bedtime. ) 60 tablet 11  . loratadine (CLARITIN) 10 MG tablet Take 10 mg by mouth daily.    . meclizine (ANTIVERT) 12.5 MG tablet TAKE 1 TABLET BY MOUTH 3 TIMES DAILY AS NEEDED FOR DIZZINESS 30 tablet 2  . Memantine HCl-Donepezil HCl (NAMZARIC) 28-10 MG CP24 Take 1 tablet by mouth at bedtime. 30 capsule 11  . Multiple Vitamin (MULTIVITAMIN WITH MINERALS) TABS tablet Take 1 tablet by mouth daily.    . Omega-3 Fatty Acids (OMEGA 3 PO) Take 1 capsule by mouth daily.    Bertram Gala Glycol-Propyl Glycol (SYSTANE OP) Place 2-4 drops into both eyes 4 (four) times daily as needed (for dry eyes).      No current facility-administered medications on file prior to visit.    Review of Systems All otherwise neg per pt     Objective:   Physical Exam BP (!) 162/92 (BP Location: Left Arm, Patient Position: Sitting, Cuff Size: Normal)   Pulse 75   Temp 97.7 F (36.5 C) (Oral)   Resp 16   Ht 5\' 3"  (1.6 m)   Wt 146 lb (66.2 kg)   SpO2 95%   BMI 25.86 kg/m  VS noted,  Constitutional: Pt appears in no apparent distress HENT: Head: NCAT.  Right Ear: External ear normal.  Left Ear: External ear normal.  Eyes: . Pupils are equal, round, and reactive to light. Conjunctivae and EOM are normal Neck: Normal range of motion. Neck supple.  Cardiovascular: Normal rate and regular rhythm.   Pulmonary/Chest: Effort normal and breath sounds without rales or wheezing.  Abd:  Soft, NT, ND, + BS Neurological: Pt is alert. At baseline confused , motor grossly intact Skin: Skin is warm. No rash, no LE edema Psychiatric: Pt behavior is normal. No agitation.  No other exam findings     Assessment & Plan:

## 2016-10-06 NOTE — Telephone Encounter (Signed)
Per daughter at office visit with mother today, they did not qualify for patient assistance----according to insurance benefits --patient has estimated copay $215.00---daughter and patient wants to get prolia today and pay estimated copay without any worries with patient assistance program---

## 2016-10-06 NOTE — Telephone Encounter (Signed)
Left message for a return call

## 2016-10-06 NOTE — Assessment & Plan Note (Signed)
May be mild elevated, pt declines any change in tx;  Pt and family interested in Nutrition consult due to HTN and wt loss

## 2016-10-07 NOTE — Telephone Encounter (Signed)
Her mother has been on donepezil and memantine separately in the past.  Her daughter says she had a hard time adjusting to the combination in the beginning but the side effects eventually resolved. I have checked with Dr. Terrace ArabiaYan on this and it has been recommended to the patient to take the medication in the morning.  Also, she has only been on this medication for one week - she is willing to try it for longer.  They will call back with any further concerns.

## 2016-10-29 ENCOUNTER — Encounter: Payer: Medicare HMO | Admitting: Neurology

## 2016-11-02 ENCOUNTER — Encounter: Payer: Self-pay | Admitting: Internal Medicine

## 2016-11-02 ENCOUNTER — Telehealth: Payer: Self-pay | Admitting: Internal Medicine

## 2016-11-02 MED ORDER — TRAZODONE HCL 50 MG PO TABS: 50.0000 mg | ORAL_TABLET | Freq: Every day | ORAL | 1 refills | Status: DC

## 2016-11-02 NOTE — Telephone Encounter (Signed)
Called pt's daughter and left a detailed msg.   Faxed script.

## 2016-11-02 NOTE — Telephone Encounter (Signed)
Pt's daughter called stating that the pt has been struggling with insomnia. She wanted to know if something could be prescribed for her or if she would need an appointment. Please advise.

## 2016-11-02 NOTE — Telephone Encounter (Signed)
Ok to try trazodone  - done erx 

## 2016-11-05 ENCOUNTER — Encounter: Payer: Self-pay | Admitting: Internal Medicine

## 2016-11-05 ENCOUNTER — Ambulatory Visit (INDEPENDENT_AMBULATORY_CARE_PROVIDER_SITE_OTHER): Payer: Medicare HMO | Admitting: Internal Medicine

## 2016-11-05 VITALS — BP 168/90 | HR 68 | Ht 64.0 in | Wt 141.0 lb

## 2016-11-05 DIAGNOSIS — F0390 Unspecified dementia without behavioral disturbance: Secondary | ICD-10-CM

## 2016-11-05 DIAGNOSIS — R634 Abnormal weight loss: Secondary | ICD-10-CM | POA: Diagnosis not present

## 2016-11-05 DIAGNOSIS — L309 Dermatitis, unspecified: Secondary | ICD-10-CM | POA: Diagnosis not present

## 2016-11-05 DIAGNOSIS — I10 Essential (primary) hypertension: Secondary | ICD-10-CM

## 2016-11-05 DIAGNOSIS — H9193 Unspecified hearing loss, bilateral: Secondary | ICD-10-CM

## 2016-11-05 DIAGNOSIS — R51 Headache: Secondary | ICD-10-CM | POA: Diagnosis not present

## 2016-11-05 DIAGNOSIS — B370 Candidal stomatitis: Secondary | ICD-10-CM | POA: Insufficient documentation

## 2016-11-05 DIAGNOSIS — R569 Unspecified convulsions: Secondary | ICD-10-CM

## 2016-11-05 DIAGNOSIS — R519 Headache, unspecified: Secondary | ICD-10-CM

## 2016-11-05 MED ORDER — ALPRAZOLAM 0.25 MG PO TABS: ORAL_TABLET | ORAL | 5 refills | Status: DC

## 2016-11-05 MED ORDER — NYSTATIN 100000 UNIT/ML MT SUSP: 500000.0000 [IU] | Freq: Four times a day (QID) | OROMUCOSAL | 3 refills | Status: AC

## 2016-11-05 MED ORDER — MEGESTROL ACETATE 40 MG PO TABS: 40.0000 mg | ORAL_TABLET | Freq: Every day | ORAL | 3 refills | Status: DC

## 2016-11-05 MED ORDER — METHYLPREDNISOLONE ACETATE 80 MG/ML IJ SUSP
80.0000 mg | Freq: Once | INTRAMUSCULAR | Status: AC
  Administered 2016-11-05: 80 mg via INTRAMUSCULAR

## 2016-11-05 MED ORDER — AMLODIPINE BESYLATE 10 MG PO TABS: 10.0000 mg | ORAL_TABLET | Freq: Every day | ORAL | 3 refills | Status: DC

## 2016-11-05 MED ORDER — FLUOCINONIDE 0.05 % EX CREA: TOPICAL_CREAM | Freq: Two times a day (BID) | CUTANEOUS | 1 refills | Status: DC

## 2016-11-05 NOTE — Patient Instructions (Addendum)
You had the steroid shot today  Your ears were irrigated on both sides today to remove the wax, but did not work out, so You will be contacted regarding the referral for: ENT  Your blood pressures were high today, and did drop with standing, which is called orthostasis, that may be due to mild dehydration. We can increase the Amlodipine to 10 mg per day for blood pressure that is too high,  but be sure to drink more fluids, and take Ensure or Boost to help the nutrition  Please take all new medication as prescribed - the nystatin  Please take all new medication as prescribed - also the megace for appetite  You will be contacted regarding the referral for: MRI head   Please continue all other medications as before, and refills have been done if requested.  Please have the pharmacy call with any other refills you may need.  Please keep your appointments with your specialists as you may have planned  No further lab work is needed today  Please return in 6 months, or sooner if needed

## 2016-11-05 NOTE — Progress Notes (Signed)
Pre visit review using our clinic review tool, if applicable. No additional management support is needed unless otherwise documented below in the visit note. 

## 2016-11-05 NOTE — Progress Notes (Signed)
Subjective:    Patient ID: Teresa Riley, female    DOB: 02/02/1935, 82 y.o.   MRN: [id number]  HPI  Here with family concerned about numerous issues;  Pt denies chest pain, increased sob or doe, wheezing, orthopnea, PND, increased LE swelling, palpitations, or syncope but has recurring dizziness.  Pt daughter has been told pt has had dehydration in the past, but today states "they used that as an excuse."    Pt denies new neurological symptoms such as new headache, or facial or extremity weakness or numbness, and no recent seizures.  Dementia overall stable symptomatically, and not assoc with behavioral changes such as hallucinations, paranoia, or agitation.   Has had chronic recurrent pain behind the eyes for at least 7 yrs, now some worse recently per daughter, has had multiple evaluations and eye drop trial including steoroid without resolution.  Pt denies polydipsia, polyuria.  Has had recent reduced appetite and 5 lbs wt loss.  + thrush like rash to tongue, and food has not tasted right for 1 yr. Denies worsening reflux, abd pain, dysphagia, n/v, bowel change or blood.  Has a small bump near the right inner elbow for several wks, eczema rash has also been more active with itching in the past 2-3 wks.  Also has worsening bilat hearing loss and confusion, ? Wax impactions again. BP Readings from Last 3 Encounters:  11/05/16 (!) 168/90  10/06/16 (!) 162/92  09/27/16 (!) 157/97   Wt Readings from Last 3 Encounters:  11/05/16 141 lb (64 kg)  10/06/16 146 lb (66.2 kg)  09/27/16 148 lb 8 oz (67.4 kg)   Past Medical History:  Diagnosis Date  . Allergic rhinitis 09/05/2014  . Allergic rhinitis, cause unspecified 10/29/2010  . Chronic constipation 10/29/2010  . Eczema 10/29/2010  . Gait difficulty   . H/O: hysterectomy 10/29/2010  . History of cervical cancer 10/29/2010  . HTN (hypertension) 10/29/2010  . Hyperlipidemia   . Hypertension   . Impaired glucose tolerance 08/16/2012  . Loss of appetite     . Memory loss   . Orthostatic hypotension 06/05/2012  . Osteoporosis   . Right hip pain   . Seizures (HCC)   . Vascular dementia   . Vitamin D deficiency 09/05/2014   Past Surgical History:  Procedure Laterality Date  . ABDOMINAL HYSTERECTOMY  1970  . BREAST BIOPSY  1980's   benign  . CATARACT EXTRACTION W/ INTRAOCULAR LENS  IMPLANT, BILATERAL  01/2011  . EYE SURGERY      reports that she quit smoking about 45 years ago. Her smoking use included Cigarettes. She has never used smokeless tobacco. She reports that she does not drink alcohol or use drugs. family history includes Diabetes in her father; High blood pressure in her daughter; Hypertension in her father and mother. No Known Allergies Current Outpatient Prescriptions on File Prior to Visit  Medication Sig Dispense Refill  . albuterol (PROAIR HFA) 108 (90 Base) MCG/ACT inhaler Inhale 2 puffs into the lungs every 6 (six) hours as needed for wheezing or shortness of breath. 18 g 3  . aspirin EC 81 MG tablet Take 81 mg by mouth daily with breakfast.    . atorvastatin (LIPITOR) 10 MG tablet Take 1 tablet (10 mg total) by mouth daily. 90 tablet 3  . Calcium-Magnesium-Vitamin D (CALCIUM MAGNESIUM PO) Take 1 tablet by mouth daily.    . cycloSPORINE (RESTASIS) 0.05 % ophthalmic emulsion Place 1 drop into both eyes at bedtime.    Marland Kitchen  denosumab (PROLIA) 60 MG/ML SOLN injection Inject 60 mg into the skin every 6 (six) months. Administer in upper arm, thigh, or abdomen    . fluticasone (FLONASE) 50 MCG/ACT nasal spray instill 1 spray into each nostril once daily 16 g 2  . labetalol (NORMODYNE) 300 MG tablet Take 1 tablet (300 mg total) by mouth 2 (two) times daily. Yearly physical w/labs due in March must see MD for refills 180 tablet 0  . levETIRAcetam (KEPPRA) 750 MG tablet Take 1 tablet (750 mg total) by mouth 2 (two) times daily. 60 tablet 11  . lisinopril (PRINIVIL,ZESTRIL) 20 MG tablet Take 1 tablet (20 mg total) by mouth 2 (two) times  daily. (Patient taking differently: Take 20 mg by mouth at bedtime. ) 60 tablet 11  . loratadine (CLARITIN) 10 MG tablet Take 10 mg by mouth daily.    . meclizine (ANTIVERT) 12.5 MG tablet TAKE 1 TABLET BY MOUTH 3 TIMES DAILY AS NEEDED FOR DIZZINESS 30 tablet 2  . Memantine HCl-Donepezil HCl (NAMZARIC) 28-10 MG CP24 Take 1 tablet by mouth at bedtime. 30 capsule 11  . Multiple Vitamin (MULTIVITAMIN WITH MINERALS) TABS tablet Take 1 tablet by mouth daily.    . Omega-3 Fatty Acids (OMEGA 3 PO) Take 1 capsule by mouth daily.    Bertram Gala Glycol-Propyl Glycol (SYSTANE OP) Place 2-4 drops into both eyes 4 (four) times daily as needed (for dry eyes).     . traZODone (DESYREL) 50 MG tablet Take 1 tablet (50 mg total) by mouth at bedtime. 90 tablet 1   No current facility-administered medications on file prior to visit.    Review of Systems Unable due to dementia    Objective:   Physical Exam BP (!) 168/90   Pulse 68   Ht  (1.626 m)   Wt 141 lb (64 kg)   SpO2 98%   BMI 24.20 kg/m  VS noted, orthostatics - lying 166/88, standing 140/80 Constitutional: Pt appears in NAD HENT: Head: NCAT.  Right Ear: External ear normal.  Left Ear: External ear normal.  Bilat ear canals irrigated of wax but incomplete and hearing not improved Mouth; tongue with + thrush like whitish rash dorsal only though could also be related to mouth breathing Eyes: . Pupils are equal, round, and reactive to light. Conjunctivae and EOM are normal Nose: without d/c or deformity Neck: Neck supple. Gross normal ROM Cardiovascular: Normal rate and regular rhythm.   Pulmonary/Chest: Effort normal and breath sounds without rales or wheezing.  Abd:  Soft, NT, ND, + BS, no organomegaly Neurological: Pt is alert. At baseline orientation - oriented to name only, motor grossly intact Skin: Skin is warm. + scattered eczema type rashes to the extremities, no other new lesions, no LE edema, right inner elbow with 5 mm hard  slight raised lesion nontender, scar like Psychiatric: Pt behavior is normal without agitation  No other exam findings  Lab Results  Component Value Date   WBC 7.4 09/16/2015   HGB 12.9 09/16/2015   HCT 38.2 09/16/2015   PLT 249.0 09/16/2015   GLUCOSE 96 09/27/2016   CHOL 197 09/16/2015   TRIG 244.0 (H) 09/16/2015   HDL 68.50 09/16/2015   LDLDIRECT 93.0 09/16/2015   LDLCALC 92 09/05/2014   ALT 11 09/27/2016   AST 26 09/27/2016   NA 143 09/27/2016   K 4.0 09/27/2016   CL 100 09/27/2016   CREATININE 1.36 (H) 09/27/2016   BUN 13 09/27/2016   CO2 26 09/27/2016  TSH 1.980 09/27/2016   HGBA1C 5.5 09/27/2016       Assessment & Plan:

## 2016-11-06 DIAGNOSIS — H9193 Unspecified hearing loss, bilateral: Secondary | ICD-10-CM | POA: Insufficient documentation

## 2016-11-06 NOTE — Assessment & Plan Note (Signed)
Mild, for depomedrol 80 IM, to f/u any worsening symptoms or concerns

## 2016-11-06 NOTE — Assessment & Plan Note (Signed)
None recent, not on specific med,  to f/u any worsening symptoms or concerns

## 2016-11-06 NOTE — Assessment & Plan Note (Addendum)
Etiology unclear, exam no change, will ask for MRI head but I d/w family it may be denied by insurance due to no specific neuro change  Note:  Total time for pt hx, exam, review of record with pt in the room, determination of diagnoses and plan for further eval and tx is > 40 min, with over 50% spent in coordination and counseling of patient

## 2016-11-06 NOTE — Assessment & Plan Note (Signed)
Stable, cont current tx, has had no worsening behavioral issue so far in her course

## 2016-11-06 NOTE — Assessment & Plan Note (Signed)
Mod to severe elevated persistent, will add amlodipine, and noted to family to keep encouraging po fluids due to the orthostasis

## 2016-11-06 NOTE — Assessment & Plan Note (Signed)
Unfortunately not able to resolve with irrigation, for ENT referral

## 2016-11-06 NOTE — Assessment & Plan Note (Signed)
With reduced appetite for unclear reason, ? Geriatric decline, for encouragement nutrition such as ensure

## 2016-11-06 NOTE — Assessment & Plan Note (Signed)
Mild to mod, for antibx course - nystatin,  to f/u any worsening symptoms or concerns

## 2016-11-07 ENCOUNTER — Other Ambulatory Visit: Payer: Self-pay | Admitting: Internal Medicine

## 2016-11-08 ENCOUNTER — Ambulatory Visit: Payer: Medicare HMO | Admitting: Registered"

## 2016-11-12 ENCOUNTER — Ambulatory Visit (INDEPENDENT_AMBULATORY_CARE_PROVIDER_SITE_OTHER): Payer: Medicare HMO | Admitting: Neurology

## 2016-11-12 ENCOUNTER — Encounter (INDEPENDENT_AMBULATORY_CARE_PROVIDER_SITE_OTHER): Payer: Self-pay | Admitting: Neurology

## 2016-11-12 DIAGNOSIS — F0390 Unspecified dementia without behavioral disturbance: Secondary | ICD-10-CM

## 2016-11-12 DIAGNOSIS — Z0289 Encounter for other administrative examinations: Secondary | ICD-10-CM

## 2016-11-12 DIAGNOSIS — R55 Syncope and collapse: Secondary | ICD-10-CM

## 2016-11-12 DIAGNOSIS — R202 Paresthesia of skin: Secondary | ICD-10-CM

## 2016-11-12 NOTE — Procedures (Cosign Needed)
Full Name: Teresa Riley Gender: Female MRN #: [id number] Date of Birth: 1935-01-29    Visit Date: 11/12/2016 12:28 Age: [age] Years 10 Months Old Examining Physician: Levert FeinsteinYijun Taahir Grisby, MD  Referring Physician: Levert FeinsteinYan, Redell Bhandari MD History: 82 years old female, presenting with frequent dizziness, unsteady gait, bilateral feet paresthesia.  Summary of the test: Nerve conduction study: Bilateral lower extremity sensory and motor nerve conduction studies were normal.  Electromyography: Selective needle examination of bilateral lower extremity muscles and bilateral lumbosacral paraspinal muscles were normal.  Conclusion: This is a normal study. There is no electrodiagnostic evidence of large fiber peripheral neuropathy or lumbosacral radiculopathy.    ------------------------------- Levert FeinsteinYijun Ajee Heasley, M.D.  [facility] [address] [place], [place] [address] Tel: [phone] Fax: [phone]        [facility]    Nerve / Sites Rec. Site Latency Ref. Amplitude Ref. Rel Amp Segments Distance Velocity Ref. Area    ms ms mV mV %  cm m/s m/s mVms  L Peroneal - EDB     Ankle EDB 4.3 ?6.5 5.7 ?2.0 100 Ankle - EDB 9   18.1     Fib head EDB 10.6  5.4  96.1 Fib head - Ankle 29 46 ?44 17.4     Pop fossa EDB 12.6  5.4  99.4 Pop fossa - Fib head 10 49 ?44 17.6         Pop fossa - Ankle      R Peroneal - EDB     Ankle EDB 4.6 ?6.5 7.9 ?2.0 100 Ankle - EDB 9   21.3     Fib head EDB 10.5  7.2  90.7 Fib head - Ankle 29 49 ?44 20.5     Pop fossa EDB 12.4  7.2  101 Pop fossa - Fib head 10 52 ?44 22.8         Pop fossa - Ankle      L Tibial - AH     Ankle AH 4.4 ?5.8 8.0 ?4.0 100 Ankle - AH 9   20.2     Pop fossa AH 13.1  6.0  75 Pop fossa - Ankle 38 44 ?41 20.4  R Tibial - AH     Ankle AH 4.4 ?5.8 6.8 ?4.0 100 Ankle - AH 9   17.4     Pop fossa AH 12.7  5.7  84.7 Pop fossa - Ankle 38 46 ?41 18.3             SNC    Nerve / Sites Rec. Site Peak Lat Ref.  Amp Ref. Segments Distance    ms ms V  V  cm  L Sural - Ankle (Calf)     Calf Ankle 3.65 ?4.40 12 ?6 Calf - Ankle 14  R Sural - Ankle (Calf)     Calf Ankle 3.80 ?4.40 7 ?6 Calf - Ankle 14  L Superficial peroneal - Ankle     Lat leg Ankle 3.75 ?4.40 8 ?6 Lat leg - Ankle 14  R Superficial peroneal - Ankle     Lat leg Ankle 3.96 ?4.40 9 ?6 Lat leg - Ankle 14             F  Wave    Nerve F Lat Ref.   ms ms  L Tibial - AH 50.0 ?56.0  R Tibial - AH 50.3 ?56.0         EMG full       EMG  Summary Table    Spontaneous MUAP Recruitment  Muscle IA Fib PSW Fasc Other Amp Dur. Poly Pattern  L. Tibialis anterior Normal None None None _______ Normal Normal Normal Normal  L. Gastrocnemius (Medial head) Normal None None None _______ Normal Normal Normal Normal  L. Vastus lateralis Normal None None None _______ Normal Normal Normal Normal  R. Tibialis anterior Normal None None None _______ Normal Normal Normal Normal  R. Gastrocnemius (Medial head) Normal None None None _______ Normal Normal Normal Normal  R. Vastus lateralis Normal None None None _______ Normal Normal Normal Normal  R. Lumbar paraspinals (mid) Normal None None None _______ Normal Normal Normal Normal  R. Lumbar paraspinals (low) Normal None None None _______ Normal Normal Normal Normal  L. Lumbar paraspinals (mid) Normal None None None _______ Normal Normal Normal Normal  L. Lumbar paraspinals (low) Normal None None None _______ Normal Normal Normal Normal  L. Abductor hallucis Normal None None None _______ Normal Normal Normal Normal

## 2016-11-18 ENCOUNTER — Ambulatory Visit
Admission: RE | Admit: 2016-11-18 | Discharge: 2016-11-18 | Disposition: A | Discharge status: Home / Self Care | Payer: Medicare HMO | Source: Ambulatory Visit | Attending: Internal Medicine | Admitting: Internal Medicine

## 2016-11-18 DIAGNOSIS — R51 Headache: Secondary | ICD-10-CM

## 2016-11-18 DIAGNOSIS — R519 Headache, unspecified: Secondary | ICD-10-CM

## 2016-11-18 DIAGNOSIS — R569 Unspecified convulsions: Secondary | ICD-10-CM

## 2016-11-18 DIAGNOSIS — F0390 Unspecified dementia without behavioral disturbance: Secondary | ICD-10-CM

## 2016-11-26 DIAGNOSIS — H40013 Open angle with borderline findings, low risk, bilateral: Secondary | ICD-10-CM | POA: Diagnosis not present

## 2016-11-26 DIAGNOSIS — H01003 Unspecified blepharitis right eye, unspecified eyelid: Secondary | ICD-10-CM | POA: Diagnosis not present

## 2016-11-26 DIAGNOSIS — H04123 Dry eye syndrome of bilateral lacrimal glands: Secondary | ICD-10-CM | POA: Diagnosis not present

## 2016-12-01 DIAGNOSIS — H6123 Impacted cerumen, bilateral: Secondary | ICD-10-CM | POA: Insufficient documentation

## 2017-01-09 ENCOUNTER — Other Ambulatory Visit: Payer: Self-pay | Admitting: Internal Medicine

## 2017-01-30 ENCOUNTER — Other Ambulatory Visit: Payer: Self-pay | Admitting: Internal Medicine

## 2017-02-25 ENCOUNTER — Telehealth: Payer: Self-pay | Admitting: Internal Medicine

## 2017-02-25 MED ORDER — ALBUTEROL SULFATE HFA 108 (90 BASE) MCG/ACT IN AERS: 2.0000 | INHALATION_SPRAY | Freq: Four times a day (QID) | RESPIRATORY_TRACT | 2 refills | Status: DC | PRN

## 2017-02-25 NOTE — Telephone Encounter (Signed)
Reviewed chart pt is up-to-date sent refill to pof...Teresa Riley

## 2017-02-25 NOTE — Telephone Encounter (Signed)
Daughter called in requesting refill on albuterol.  Patient uses [facility] on [address].  Please make sure this is primary pharmacy.

## 2017-02-28 DIAGNOSIS — H40013 Open angle with borderline findings, low risk, bilateral: Secondary | ICD-10-CM | POA: Diagnosis not present

## 2017-02-28 DIAGNOSIS — H04123 Dry eye syndrome of bilateral lacrimal glands: Secondary | ICD-10-CM | POA: Diagnosis not present

## 2017-03-10 ENCOUNTER — Telehealth: Payer: Self-pay | Admitting: Internal Medicine

## 2017-03-10 MED ORDER — HYDROCORTISONE ACETATE 25 MG RE SUPP: 25.0000 mg | Freq: Two times a day (BID) | RECTAL | 1 refills | Status: DC

## 2017-03-10 MED ORDER — HYDROCORTISONE 2.5 % RE CREA: 1.0000 "application " | TOPICAL_CREAM | Freq: Two times a day (BID) | RECTAL | 0 refills | Status: DC

## 2017-03-10 NOTE — Telephone Encounter (Signed)
anusol supp done erx

## 2017-03-10 NOTE — Telephone Encounter (Signed)
Ok for change to anusol HC cream (topical) - done erx

## 2017-03-10 NOTE — Telephone Encounter (Signed)
Is there an alternative?

## 2017-03-10 NOTE — Telephone Encounter (Signed)
Notified pt daughter rx sent to pharmacy...Redgie Grayer/lbm

## 2017-03-10 NOTE — Telephone Encounter (Signed)
Pt daughter called back and this medication needs a PA. Would it be better to do the PA or send in another med?  Please advise

## 2017-03-10 NOTE — Addendum Note (Signed)
Addended by: Corwin LevinsJOHN, [name] on: 03/10/2017 03:38 PM   Modules accepted: Orders

## 2017-03-10 NOTE — Telephone Encounter (Signed)
States patient is having issues with hemorrhoids.  Would like to know if Dr. [name] would be able to send a script to Teresa Riley?

## 2017-03-28 ENCOUNTER — Other Ambulatory Visit: Payer: Self-pay

## 2017-03-28 MED ORDER — LISINOPRIL 20 MG PO TABS: 20.0000 mg | ORAL_TABLET | Freq: Two times a day (BID) | ORAL | 0 refills | Status: DC

## 2017-03-31 ENCOUNTER — Encounter: Payer: Self-pay | Admitting: Neurology

## 2017-03-31 ENCOUNTER — Ambulatory Visit (INDEPENDENT_AMBULATORY_CARE_PROVIDER_SITE_OTHER): Payer: Medicare HMO | Admitting: Neurology

## 2017-03-31 VITALS — BP 143/80 | HR 72 | Ht 64.0 in | Wt 136.5 lb

## 2017-03-31 DIAGNOSIS — R413 Other amnesia: Secondary | ICD-10-CM

## 2017-03-31 DIAGNOSIS — R202 Paresthesia of skin: Secondary | ICD-10-CM | POA: Diagnosis not present

## 2017-03-31 DIAGNOSIS — R55 Syncope and collapse: Secondary | ICD-10-CM | POA: Diagnosis not present

## 2017-03-31 MED ORDER — MEMANTINE HCL-DONEPEZIL HCL ER 28-10 MG PO CP24: 1.0000 | ORAL_CAPSULE | Freq: Every day | ORAL | 4 refills | Status: DC

## 2017-03-31 NOTE — Progress Notes (Addendum)
PATIENT: Teresa Riley DOB: [birth year]   HISTORY OF PRESENT ILLNESS:  She was referred by her primary care physician Dr. 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 MRI scan of the brain in 2014, shows extensive changes of chroniic microvascular ischemia and moderate degree of generalized cerebral atrophy and ventricular enlargement which appear slightly more advanced compared with previous MRI dated 06/03/2006. She began to have episodes of seizure-like event since 2013, she felt overheated, then become confused, blank stares, then slump down, body jerking movement,   For a while, she continue have recurrent episode on titrating dose of Keppra up to 1500 mg twice a day, Vimpat 100 mg twice a day, EEG in March 2016 showed bifrontal delta slowing, with right hemispheric predominance. This is consistent with FIRDA.  Upon further questioning, all episode happened in a standing position, she was on 4 agents treatment for her hypertension, lisinopril 40 mg in the morning, amlodipine 5 mg in the morning, hydrochlorothiazide 25 mg in the morning, atenolol 300 mg twice a day, all the episodes clustered in the morning time, shortly after she take her blood pressure medications.  I have decreased her blood pressure medication since May 2016, lisinopril 20 mg twice a day, amlodipine 5 mg every night, atenolol 300 mg every morning, stopped hydrochlorothiazide, and potassium supplement,  She no longer has recurrent passing out episode, seizure-like event, doing very well, continue has mild memory trouble,   UPDATE May 27 2015: She is now taking Keppra 1000 mg twice a  day, there was no recurrent seizure like activity, she is overall doing well, she continue have mild unsteady gait, right hip pain, ambulate with a cane, I will continue to decrease her Keppra to 500 mg twice a day,  UPDATE Aug 27 2015:  She is overall doing very well, there was no recurrent passing out episode, she was put on lower dose Keppra 500 mg twice a day since November 2016, daughter did reported previously she had one typical episode consistent with complex partial seizure, sitting at Dr. Raphael Gibney office, had sudden loss of muscle tone, staring spells, followed by body jerking movement, lasting for few minutes, post event confusion,  Today her blood pressure is mildly elevated 150/90, I have advised her continue to document her blood pressure daily ANA was negative, no evidence of connective tissue disease  Update September 27 2016. Last visit was on December 19 80   with nurse practitioner Aundra Millet, she reported woke up on the floor the day prior on December 18 80, she was sitting at the edge of the bed, dizziness, next thing she woke up, her denture has been throughout of her mouth, she had urinary incontinence, she began to have recurrent episode of dizziness, lightheadedness again, also complains of dizziness when she turning over in bed,  She has worsening memory loss, Mini-Mental Status Examination 27/30,   We have personally reviewed MRI of the brain in 2015, significant generalized atrophy, supratentorium small vessel disease, she denies a family history of memory loss,  Today we will able to show significant orthostatic blood pressure changes, lying down blood pressure 104/96, 69, standing up 159/88, 73, 5 minutes standing up 163/92, 69  She is on polypharmacy for blood pressure treatment, atenolol 300 mg every morning, Norvasc 5 mg every night, lisinopril 20 mg every night  She also complains of bilateral feet paresthesia, mild length dependent sensory changes  UPDATE Sept 20  2018:  REVIEW OF SYSTEMS: Out of a complete 14 system review of symptoms, the patient complains only of the following symptoms, and all other reviewed systems are negative.  Appetite change, expected weight change, eye pain, shortness of breath, frequent wakening, memory loss  ALLERGIES: No Known Allergies  HOME MEDICATIONS: Outpatient Medications Prior to Visit  Medication Sig Dispense Refill  . albuterol (PROAIR HFA) 108 (90 Base) MCG/ACT inhaler Inhale 2 puffs into the lungs every 6 (six) hours as needed for wheezing or shortness of breath. 18 g 2  . ALPRAZolam (XANAX) 0.25 MG tablet TAKE 1 TABLET BY MOUTH 2 TIMES DAILY AS NEEDED 60 tablet 5  . amLODipine (NORVASC) 10 MG tablet Take 1 tablet (10 mg total) by mouth daily. 90 tablet 3  . aspirin EC 81 MG tablet Take 81 mg by mouth daily with breakfast.    . atorvastatin (LIPITOR) 10 MG tablet TAKE 1 TABLET BY MOUTH ONCE DAILY 90 tablet 0  . Calcium-Magnesium-Vitamin D (CALCIUM MAGNESIUM PO) Take 1 tablet by mouth daily.    . cycloSPORINE (RESTASIS) 0.05 % ophthalmic emulsion Place 1 drop into both eyes at bedtime.    Marland Kitchen denosumab (PROLIA) 60 MG/ML SOLN injection Inject 60 mg into the skin every 6 (six) months. Administer in upper arm, thigh, or abdomen    . fluocinonide cream (LIDEX) 0.05 % Apply topically 2 (two) times daily. 30 g 1  . fluticasone (FLONASE) 50 MCG/ACT nasal spray instill 1 spray into each nostril once daily (Patient taking differently: instill 1 spray into each nostril once daily, as needed.) 16 g 2  . hydrocortisone (ANUSOL-HC) 25 MG suppository Place 1 suppository (25 mg total) rectally every 12 (twelve) hours. 12 suppository 1  . labetalol (NORMODYNE) 300 MG tablet take 1 tablet by mouth twice a day 180 tablet 2  . levETIRAcetam (KEPPRA) 750 MG tablet Take 1 tablet (750 mg total) by mouth 2 (two) times daily. 60 tablet 11  . lisinopril (PRINIVIL,ZESTRIL) 20 MG tablet Take 1 tablet (20 mg total) by mouth 2 (two) times  daily. 60 tablet 0  . loratadine (CLARITIN) 10 MG tablet Take 10 mg by mouth daily.    . meclizine (ANTIVERT) 12.5 MG tablet TAKE 1 TABLET BY MOUTH 3 TIMES DAILY AS NEEDED FOR DIZZINESS 30 tablet 2  . Memantine HCl-Donepezil HCl (NAMZARIC) 28-10 MG CP24 Take 1 tablet by mouth at bedtime. (Patient taking differently: Take 1 tablet by mouth daily. ) 30 capsule 11  . Multiple Vitamin (MULTIVITAMIN WITH MINERALS) TABS tablet Take 1 tablet by mouth daily.    . Omega-3 Fatty Acids (OMEGA 3 PO) Take 1 capsule by mouth daily.    Bertram Gala Glycol-Propyl Glycol (SYSTANE OP) Place 2-4 drops into both eyes 4 (four) times daily as needed (for dry eyes).     . traZODone (DESYREL) 50 MG tablet Take 1 tablet (50 mg total) by mouth at bedtime. 90 tablet 1  . hydrocortisone (ANUSOL-HC) 2.5 % rectal cream Place 1 application rectally 2 (two) times daily. 30 g 0  . megestrol (MEGACE) 40 MG tablet Take 1 tablet (40 mg total) by mouth daily. 90 tablet 3   No facility-administered medications prior to visit.     PAST MEDICAL HISTORY: Past Medical History:  Diagnosis Date  . Allergic rhinitis 09/05/2014  . Allergic rhinitis, cause unspecified 10/29/2010  . Chronic constipation 10/29/2010  . Eczema 10/29/2010  . Gait difficulty   . H/O: hysterectomy 10/29/2010  . History of cervical cancer 10/29/2010  . HTN (hypertension) 10/29/2010  . Hyperlipidemia   . Hypertension   . Impaired glucose tolerance 08/16/2012  . Loss of appetite   . Memory loss   . Orthostatic hypotension 06/05/2012  . Osteoporosis   . Right hip pain   . Seizures (HCC)   . Vascular dementia   . Vitamin D deficiency 09/05/2014    PAST SURGICAL HISTORY: Past Surgical History:  Procedure Laterality Date  . ABDOMINAL HYSTERECTOMY  1970  . BREAST BIOPSY  1980's   benign  . CATARACT EXTRACTION W/ INTRAOCULAR LENS  IMPLANT, BILATERAL  01/2011  . EYE SURGERY      FAMILY HISTORY: Family History  Problem Relation Age of Onset  . Hypertension  Mother   . Hypertension Father   . Diabetes Father   . High blood pressure Daughter        x2  . Colon cancer Neg Hx   . Colon polyps Neg Hx   . Kidney disease Neg Hx   . Gallbladder disease Neg Hx     SOCIAL HISTORY: Social History   Social History  . Marital status: Divorced    Spouse name: N/A  . Number of children: 3  . Years of education: 2   Occupational History  . retired Runner, broadcasting/film/video    Social History Main Topics  . Smoking status: Former Smoker    Types: Cigarettes    Quit date: 07/13/1971  . Smokeless tobacco: Never Used     Comment: quit in 1973  . Alcohol use No  . Drug use: No  . Sexual activity: No   Other Topics Concern  . Not on file   Social History Narrative   Patient lives at home with two daughters Wadie Lessen) Anda Latina). Patient is retired. Patient has two years college.      PHYSICAL EXAM  Vitals:   03/31/17 1312  BP: (!) 143/80  Pulse: 72  Weight: 136 lb 8 oz (61.9 kg)  Height:  (1.626 m)   Body mass index is 23.43 kg/m.  PHYSICAL EXAMNIATION:  Gen: NAD, conversant, well nourised, obese, well groomed                     Cardiovascular: Regular rate rhythm, no peripheral edema, warm, nontender. Eyes: Conjunctivae clear without exudates or hemorrhage Neck: Supple, no carotid bruits. Pulmonary: Clear to auscultation bilaterally   NEUROLOGICAL EXAM: MMSE - Mini Mental State Exam 03/31/2017 09/27/2016 02/24/2016  Orientation to time Orientation to Place Registration Attention/ Calculation Recall Language- name 2 objects Language- repeat Language- follow 3 step command Language- read & follow direction Write a sentence Copy design Total score animal naming 5   CRANIAL NERVES: CN II: Visual fields are full to confrontation. Fundoscopic exam is normal with sharp discs and no vascular changes. Pupils are round equal and briskly  reactive to light. CN III, IV, VI: extraocular movement are normal. No ptosis. CN V: Facial sensation is intact to pinprick in all  3 divisions bilaterally. Corneal responses are intact.  CN VII: Face is symmetric with normal eye closure and smile. CN VIII: Hearing is normal to rubbing fingers CN IX, X: Palate elevates symmetrically. Phonation is normal. CN XI: Head turning and shoulder shrug are intact CN XII: Tongue is midline with normal movements and no atrophy.  MOTOR: There is no pronator drift of out-stretched arms. Muscle bulk and tone are normal. Muscle strength is normal.  REFLEXES: Reflexes are 1 and symmetric at the biceps, triceps, knees, and ankles. Plantar responses are flexor.  SENSORY: Length dependent decreased to to light touch, pinprick at bilateral ankle level  COORDINATION: Rapid alternating movements and fine finger movements are intact. There is no dysmetria on finger-to-nose and heel-knee-shin.    GAIT/STANCE: Need to push up to get up from seated position, wide based, cautious Romberg is absent.   DIAGNOSTIC DATA (LABS, IMAGING, TESTING) - I reviewed patient records, labs, notes, testing and imaging myself where available.  Lab Results  Component Value Date   WBC 7.4 09/16/2015   HGB 12.9 09/16/2015   HCT 38.2 09/16/2015   MCV 94.3 09/16/2015   PLT 249.0 09/16/2015      Component Value Date/Time   NA 143 09/27/2016 1700   K 4.0 09/27/2016 1700   CL 100 09/27/2016 1700   CO2 26 09/27/2016 1700   GLUCOSE 96 09/27/2016 1700   GLUCOSE 127 (H) 09/16/2015 1554   BUN 13 09/27/2016 1700   CREATININE 1.36 (H) 09/27/2016 1700   CALCIUM 10.1 09/27/2016 1700   PROT 7.7 09/27/2016 1700   ALBUMIN 4.3 09/27/2016 1700   AST 26 09/27/2016 1700   ALT 11 09/27/2016 1700   ALKPHOS 52 09/27/2016 1700   BILITOT 0.4 09/27/2016 1700   GFRNONAA 37 (L) 09/27/2016 1700   GFRAA 43 (L) 09/27/2016 1700   Lab Results  Component Value Date   CHOL 197 09/16/2015    HDL 68.50 09/16/2015   LDLCALC 92 09/05/2014   LDLDIRECT 93.0 09/16/2015   TRIG 244.0 (H) 09/16/2015   CHOLHDL 3 09/16/2015   Lab Results  Component Value Date   HGBA1C 5.5 09/27/2016   Lab Results  Component Value Date   VITAMINB12 1,750 (H) 09/27/2016   Lab Results  Component Value Date   TSH 1.980 09/27/2016      ASSESSMENT AND PLAN 82 y.o. year old female   Passing out episodes  Most suggestive orthostatic blood pressure changes, previously she had good response with adjustment of medications, and continue recurrent spells with titrating dose of multiple antiepileptic  Significant orthostatic blood pressure changes in the past.  Continue taper off Keppra  Keep well hydration  Change atenolol 300 mg to evening time Paresthesia, length dependent sensory changes  Laboratory evaluation for etiology of peripheral neuropathy   EMG nerve conduction study in May 2018 was normal, there was no evidence of large fiber peripheral neuropathy   Memory loss  Significant brain atrophy, periventricular small vessel disease  Continue to address vascular risk factor  Continue Namzaric one tab daily.   Levert Feinstein, M.D. Ph.D.  [facility] [address] [place], [place] [address] Phone: [phone] Fax:      [phone]

## 2017-04-15 ENCOUNTER — Other Ambulatory Visit: Payer: Self-pay | Admitting: Neurology

## 2017-04-15 ENCOUNTER — Telehealth: Payer: Self-pay | Admitting: Emergency Medicine

## 2017-04-15 NOTE — Telephone Encounter (Signed)
Pt is interested in getting her Prolia injections. She needs to know what steps to take to get this done. Please give her daughter a call back thanks.

## 2017-04-18 NOTE — Telephone Encounter (Signed)
Left message asking patient to call back to schedule nurse visit to get next prolia injection, can get anytime at patient's convenience----patient has YQ#[id number] and will have estimated $260 copay----her last prolia injection was 10/06/16---can talk with Micahel Omlor if any questions

## 2017-04-20 ENCOUNTER — Other Ambulatory Visit: Payer: Self-pay | Admitting: Internal Medicine

## 2017-04-28 ENCOUNTER — Other Ambulatory Visit (INDEPENDENT_AMBULATORY_CARE_PROVIDER_SITE_OTHER): Payer: Medicare HMO

## 2017-04-28 ENCOUNTER — Ambulatory Visit (INDEPENDENT_AMBULATORY_CARE_PROVIDER_SITE_OTHER): Payer: Medicare HMO | Admitting: Internal Medicine

## 2017-04-28 ENCOUNTER — Encounter: Payer: Self-pay | Admitting: Internal Medicine

## 2017-04-28 VITALS — BP 144/86 | HR 67 | Temp 98.0°F | Ht 64.0 in | Wt 135.0 lb

## 2017-04-28 DIAGNOSIS — Z23 Encounter for immunization: Secondary | ICD-10-CM

## 2017-04-28 DIAGNOSIS — K649 Unspecified hemorrhoids: Secondary | ICD-10-CM

## 2017-04-28 DIAGNOSIS — N183 Chronic kidney disease, stage 3 unspecified: Secondary | ICD-10-CM

## 2017-04-28 DIAGNOSIS — R339 Retention of urine, unspecified: Secondary | ICD-10-CM | POA: Diagnosis not present

## 2017-04-28 DIAGNOSIS — M810 Age-related osteoporosis without current pathological fracture: Secondary | ICD-10-CM

## 2017-04-28 DIAGNOSIS — R300 Dysuria: Secondary | ICD-10-CM | POA: Diagnosis not present

## 2017-04-28 HISTORY — DX: Chronic kidney disease, stage 3 unspecified: N18.30

## 2017-04-28 LAB — CBC WITH DIFFERENTIAL/PLATELET
BASOS PCT: 1.1 % (ref 0.0–3.0)
Basophils Absolute: 0.1 10*3/uL (ref 0.0–0.1)
EOS ABS: 0 10*3/uL (ref 0.0–0.7)
EOS PCT: 0.3 % (ref 0.0–5.0)
HCT: 38 % (ref 36.0–46.0)
Hemoglobin: 12.3 g/dL (ref 12.0–15.0)
LYMPHS ABS: 2.1 10*3/uL (ref 0.7–4.0)
Lymphocytes Relative: 38.8 % (ref 12.0–46.0)
MCHC: 32.3 g/dL (ref 30.0–36.0)
MCV: 98.7 fl (ref 78.0–100.0)
MONO ABS: 0.4 10*3/uL (ref 0.1–1.0)
Monocytes Relative: 8 % (ref 3.0–12.0)
NEUTROS ABS: 2.9 10*3/uL (ref 1.4–7.7)
NEUTROS PCT: 51.8 % (ref 43.0–77.0)
PLATELETS: 232 10*3/uL (ref 150.0–400.0)
RBC: 3.85 Mil/uL — ABNORMAL LOW (ref 3.87–5.11)
RDW: 12.4 % (ref 11.5–15.5)
WBC: 5.5 10*3/uL (ref 4.0–10.5)

## 2017-04-28 LAB — HEPATIC FUNCTION PANEL
ALK PHOS: 43 U/L (ref 39–117)
ALT: 14 U/L (ref 0–35)
AST: 28 U/L (ref 0–37)
Albumin: 4.2 g/dL (ref 3.5–5.2)
BILIRUBIN DIRECT: 0.1 mg/dL (ref 0.0–0.3)
BILIRUBIN TOTAL: 0.7 mg/dL (ref 0.2–1.2)
Total Protein: 7.7 g/dL (ref 6.0–8.3)

## 2017-04-28 LAB — BASIC METABOLIC PANEL
BUN: 16 mg/dL (ref 6–23)
CO2: 33 mEq/L — ABNORMAL HIGH (ref 19–32)
Calcium: 10 mg/dL (ref 8.4–10.5)
Chloride: 101 mEq/L (ref 96–112)
Creatinine, Ser: 1.33 mg/dL — ABNORMAL HIGH (ref 0.40–1.20)
GFR: 49.33 mL/min — AB (ref >60.00)
GLUCOSE: 105 mg/dL — AB (ref 70–99)
Potassium: 3.9 mEq/L (ref 3.5–5.1)
SODIUM: 140 meq/L (ref 135–145)

## 2017-04-28 MED ORDER — ATORVASTATIN CALCIUM 10 MG PO TABS: 10.0000 mg | ORAL_TABLET | Freq: Every day | ORAL | 3 refills | Status: DC

## 2017-04-28 MED ORDER — LISINOPRIL 20 MG PO TABS: 20.0000 mg | ORAL_TABLET | Freq: Two times a day (BID) | ORAL | 3 refills | Status: DC

## 2017-04-28 MED ORDER — ALBUTEROL SULFATE HFA 108 (90 BASE) MCG/ACT IN AERS: 2.0000 | INHALATION_SPRAY | Freq: Four times a day (QID) | RESPIRATORY_TRACT | 11 refills | Status: DC | PRN

## 2017-04-28 MED ORDER — AMLODIPINE BESYLATE 10 MG PO TABS: 10.0000 mg | ORAL_TABLET | Freq: Every day | ORAL | 3 refills | Status: DC

## 2017-04-28 MED ORDER — TAMSULOSIN HCL 0.4 MG PO CAPS: 0.4000 mg | ORAL_CAPSULE | Freq: Every day | ORAL | 3 refills | Status: DC

## 2017-04-28 MED ORDER — FLUOCINONIDE 0.05 % EX CREA: TOPICAL_CREAM | Freq: Two times a day (BID) | CUTANEOUS | 1 refills | Status: DC

## 2017-04-28 MED ORDER — ALPRAZOLAM 0.25 MG PO TABS: ORAL_TABLET | ORAL | 5 refills | Status: DC

## 2017-04-28 NOTE — Progress Notes (Signed)
Subjective:    Patient ID: Teresa Riley, female    DOB: Jun 24, 1935, 82 y.o.   MRN: [id number]  HPI  Here to f/u; overall doing ok,  Pt denies chest pain, increasing sob or doe, wheezing, orthopnea, PND, increased LE swelling, palpitations, dizziness or syncope.  Pt denies new neurological symptoms such as new headache, or facial or extremity weakness or numbness.  Pt denies polydipsia, polyuria, or low sugar episode.  Pt states overall good compliance with meds, with wt overall stable,   Wt Readings from Last 3 Encounters:  04/28/17 135 lb (61.2 kg)  03/31/17 136 lb 8 oz (61.9 kg)  11/05/16 141 lb (64 kg)  Pt states also hemorrhoids have been bothering her with a kind of discomfort but no bleeding and Denies worsening reflux, abd pain, dysphagia, n/v, bowel change.  Pt also with several months urinary hesitancy, has to sit and takes several minutes to even start urination.  Denies urinary symptoms such as dysuria, frequency, urgency, flank pain, hematuria or n/v, fever, chills. Due to continue prolia for osteoporosis, asks for injection today Past Medical History:  Diagnosis Date  . Allergic rhinitis 09/05/2014  . Allergic rhinitis, cause unspecified 10/29/2010  . Chronic constipation 10/29/2010  . CKD (chronic kidney disease) stage 3, GFR 30-59 ml/min (HCC) 04/28/2017  . Eczema 10/29/2010  . Gait difficulty   . H/O: hysterectomy 10/29/2010  . History of cervical cancer 10/29/2010  . HTN (hypertension) 10/29/2010  . Hyperlipidemia   . Hypertension   . Impaired glucose tolerance 08/16/2012  . Loss of appetite   . Memory loss   . Orthostatic hypotension 06/05/2012  . Osteoporosis   . Right hip pain   . Seizures (HCC)   . Vascular dementia   . Vitamin D deficiency 09/05/2014   Past Surgical History:  Procedure Laterality Date  . ABDOMINAL HYSTERECTOMY  1970  . BREAST BIOPSY  1980's   benign  . CATARACT EXTRACTION W/ INTRAOCULAR LENS  IMPLANT, BILATERAL  01/2011  . EYE SURGERY      reports that she quit smoking about 45 years ago. Her smoking use included Cigarettes. She has never used smokeless tobacco. She reports that she does not drink alcohol or use drugs. family history includes Diabetes in her father; High blood pressure in her daughter; Hypertension in her father and mother. No Known Allergies Current Outpatient Prescriptions on File Prior to Visit  Medication Sig Dispense Refill  . aspirin EC 81 MG tablet Take 81 mg by mouth daily with breakfast.    . Calcium-Magnesium-Vitamin D (CALCIUM MAGNESIUM PO) Take 1 tablet by mouth daily.    . cycloSPORINE (RESTASIS) 0.05 % ophthalmic emulsion Place 1 drop into both eyes at bedtime.    Marland Kitchen denosumab (PROLIA) 60 MG/ML SOLN injection Inject 60 mg into the skin every 6 (six) months. Administer in upper arm, thigh, or abdomen    . fluticasone (FLONASE) 50 MCG/ACT nasal spray instill 1 spray into each nostril once daily (Patient taking differently: instill 1 spray into each nostril once daily, as needed.) 16 g 2  . hydrocortisone (ANUSOL-HC) 25 MG suppository Place 1 suppository (25 mg total) rectally every 12 (twelve) hours. 12 suppository 1  . labetalol (NORMODYNE) 300 MG tablet take 1 tablet by mouth twice a day 180 tablet 2  . loratadine (CLARITIN) 10 MG tablet Take 10 mg by mouth daily.    . meclizine (ANTIVERT) 12.5 MG tablet TAKE 1 TABLET BY MOUTH 3 TIMES DAILY AS NEEDED FOR DIZZINESS 30  tablet 2  . Memantine HCl-Donepezil HCl (NAMZARIC) 28-10 MG CP24 Take 1 capsule by mouth at bedtime. 90 capsule 4  . Multiple Vitamin (MULTIVITAMIN WITH MINERALS) TABS tablet Take 1 tablet by mouth daily.    . Omega-3 Fatty Acids (OMEGA 3 PO) Take 1 capsule by mouth daily.    Bertram Gala. Polyethyl Glycol-Propyl Glycol (SYSTANE OP) Place 2-4 drops into both eyes 4 (four) times daily as needed (for dry eyes).     . traZODone (DESYREL) 50 MG tablet Take 1 tablet (50 mg total) by mouth at bedtime. 90 tablet 1   No current facility-administered  medications on file prior to visit.    Review of Systems  Constitutional: Negative for other unusual diaphoresis or sweats HENT: Negative for ear discharge or swelling Eyes: Negative for other worsening visual disturbances Respiratory: Negative for stridor or other swelling  Gastrointestinal: Negative for worsening distension or other blood Genitourinary: Negative for retention or other urinary change Musculoskeletal: Negative for other MSK pain or swelling Skin: Negative for color change or other new lesions Neurological: Negative for worsening tremors and other numbness  Psychiatric/Behavioral: Negative for worsening agitation or other fatigue All other system neg per pt    Objective:   Physical Exam BP (!) 144/86   Pulse 67   Temp 98 F (36.7 C) (Oral)   Ht 5\' 4"  (1.626 m)   Wt 135 lb (61.2 kg)   SpO2 98%   BMI 23.17 kg/m  VS noted,  Constitutional: Pt appears in NAD HENT: Head: NCAT.  Right Ear: External ear normal.  Left Ear: External ear normal.  Eyes: . Pupils are equal, round, and reactive to light. Conjunctivae and EOM are normal Nose: without d/c or deformity Neck: Neck supple. Gross normal ROM Cardiovascular: Normal rate and regular rhythm.   Pulmonary/Chest: Effort normal and breath sounds without rales or wheezing.  Abd:  Soft, NT, ND, + BS, no organomegaly Neurological: Pt is alert. At baseline orientation, motor grossly intact Skin: Skin is warm. No rashes, other new lesions, no LE edema Psychiatric: Pt behavior is normal without agitation  No other exam findings    Assessment & Plan:

## 2017-04-28 NOTE — Patient Instructions (Addendum)
We will ask to talk to Teresa McgregorJulie Riley today regarding the Prolia  Please take all new medication as prescribed - the flomax for the bladder  You will be contacted regarding the referral for: Kidney ultrasound, Nephrology referral (kidney doctors), and Gastroenterology  Please continue all other medications as before, and refills have been done if requested.  Please have the pharmacy call with any other refills you may need.  Please continue your efforts at being more active, low cholesterol diet, and weight control.  You are otherwise up to date with prevention measures today.  Please keep your appointments with your specialists as you may have planned  Please go to the LAB in the Basement (turn left off the elevator) for the tests to be done today  You will be contacted by phone if any changes need to be made immediately.  Otherwise, you will receive a letter about your results with an explanation, but please check with MyChart first.  Please remember to sign up for MyChart if you have not done so, as this will be important to you in the future with finding out test results, communicating by private email, and scheduling acute appointments online when needed.  Please return in 6 months, or sooner if needed

## 2017-04-29 ENCOUNTER — Ambulatory Visit (INDEPENDENT_AMBULATORY_CARE_PROVIDER_SITE_OTHER): Payer: Medicare HMO

## 2017-04-29 DIAGNOSIS — M810 Age-related osteoporosis without current pathological fracture: Secondary | ICD-10-CM

## 2017-04-29 LAB — URINALYSIS, ROUTINE W REFLEX MICROSCOPIC
BILIRUBIN URINE: NEGATIVE
HGB URINE DIPSTICK: NEGATIVE
KETONES UR: NEGATIVE
NITRITE: NEGATIVE
SPECIFIC GRAVITY, URINE: 1.015 (ref 1.000–1.030)
Total Protein, Urine: NEGATIVE
URINE GLUCOSE: NEGATIVE
Urobilinogen, UA: 0.2 (ref 0.0–1.0)
pH: 6 (ref 5.0–8.0)

## 2017-04-29 MED ORDER — DENOSUMAB 60 MG/ML ~~LOC~~ SOLN
60.0000 mg | Freq: Once | SUBCUTANEOUS | Status: AC
  Administered 2017-04-29: 60 mg via SUBCUTANEOUS

## 2017-04-29 NOTE — Progress Notes (Signed)
Medical screening examination/treatment/procedure(s) were performed by non-physician practitioner and as supervising physician I was immediately available for consultation/collaboration. I agree with above. [name], MD   

## 2017-04-29 NOTE — Telephone Encounter (Addendum)
[employer] [facility] ID# [id number] Group# [id number] Bin# [id number] PCN# [id number]  Card ID: [id number]  Pt is here today for the prolia injection - information above is what pt dtr brought in. Stated that this is the reimbursement for the $260 copay.   We are calling the [employer] to see if we can get them to fax us the form for proof of Prolia injection (x2).

## 2017-04-30 NOTE — Assessment & Plan Note (Signed)
For urine studies today, ok for flomax .4 qd,  to f/u any worsening symptoms or concerns

## 2017-04-30 NOTE — Assessment & Plan Note (Signed)
Ok to continue prolia, will need to d/c staff regarding availability today

## 2017-04-30 NOTE — Assessment & Plan Note (Signed)
Mild worsening, for renal u/s, UA and refer renal,  to f/u any worsening symptoms or concerns

## 2017-04-30 NOTE — Assessment & Plan Note (Signed)
Family requests GI referral for further evaluation,  to f/u any worsening symptoms or concerns

## 2017-05-01 ENCOUNTER — Other Ambulatory Visit: Payer: Self-pay | Admitting: Internal Medicine

## 2017-05-01 LAB — URINE CULTURE
MICRO NUMBER: 81171439
Result:: NO GROWTH
SPECIMEN QUALITY: ADEQUATE

## 2017-05-10 ENCOUNTER — Telehealth: Payer: Self-pay | Admitting: Internal Medicine

## 2017-05-10 NOTE — Telephone Encounter (Signed)
Ok to at least try the OTC Melatonin as this often can help; and let me know if does not

## 2017-05-10 NOTE — Telephone Encounter (Signed)
Patient's daughter called in and stated the patient is having a hard time sleeping at night and would like a sleep aid called in. She states they was just here. Patient was last seen on 10/18. Please advise.   [facility] [phone] [place] [address] [facility] [address] [facility] [address] [phone] (Phone) [phone] (Fax)

## 2017-05-11 MED ORDER — TRAZODONE HCL 50 MG PO TABS: 50.0000 mg | ORAL_TABLET | Freq: Every day | ORAL | 0 refills | Status: DC

## 2017-05-11 MED ORDER — TRAZODONE HCL 50 MG PO TABS: 50.0000 mg | ORAL_TABLET | Freq: Every day | ORAL | 1 refills | Status: DC

## 2017-05-11 NOTE — Telephone Encounter (Signed)
Called pt daughter Yehuda Mao(Freida) gave her MD response. Daughter states she has tried the melatonin and tylenol pm otc, and they do not help her. I verified if mom was taking the Trazodone that was given to her back in April. Daughter stated that they never got filled. Daughter is going to let her try the trazodone, but she does not have script. [facility] gave her 1 additional refill. [facility]...Raechel Chute/lmb

## 2017-05-11 NOTE — Telephone Encounter (Signed)
Ok,  Done erx 

## 2017-05-17 ENCOUNTER — Encounter: Payer: Self-pay | Admitting: Internal Medicine

## 2017-07-06 ENCOUNTER — Encounter: Payer: Self-pay | Admitting: Internal Medicine

## 2017-07-10 ENCOUNTER — Other Ambulatory Visit: Payer: Self-pay | Admitting: Internal Medicine

## 2017-08-08 ENCOUNTER — Telehealth: Payer: Self-pay | Admitting: Neurology

## 2017-08-08 NOTE — Telephone Encounter (Signed)
Pt's daughter called she passed out on 08/03/17 while standing. An appt has been scheduled for 10/13/17 with Dr Terrace ArabiaYan. If pt can see NP I will be happy to call her back to r/s.

## 2017-08-09 NOTE — Telephone Encounter (Signed)
Pt has seen both NP's, which one do I schedule her with?? Megan 1st available is 11/09/17. It looks like she has seen NP's for different dx.

## 2017-08-11 NOTE — Telephone Encounter (Signed)
[facility]  FYI

## 2017-08-15 ENCOUNTER — Ambulatory Visit (INDEPENDENT_AMBULATORY_CARE_PROVIDER_SITE_OTHER): Payer: Medicare HMO | Admitting: Adult Health

## 2017-08-15 ENCOUNTER — Encounter: Payer: Self-pay | Admitting: Adult Health

## 2017-08-15 VITALS — BP 169/85 | HR 73 | Ht 64.0 in | Wt 145.6 lb

## 2017-08-15 DIAGNOSIS — R55 Syncope and collapse: Secondary | ICD-10-CM

## 2017-08-15 DIAGNOSIS — R413 Other amnesia: Secondary | ICD-10-CM | POA: Diagnosis not present

## 2017-08-15 NOTE — Patient Instructions (Addendum)
Your Plan:  Continue to monitor symptoms EEG If your symptoms worsen or you develop new symptoms please let us know.   Thank you for [facility]. I hope we have been able to provide you high quality care today.  You may receive a patient satisfaction survey over the next few weeks. We would appreciate your feedback and comments so that we may continue to improve ourselves and the health of our patients.

## 2017-08-15 NOTE — Progress Notes (Signed)
PATIENT: Teresa Riley DOB: Jul 22, 1934  REASON FOR VISIT: follow up HISTORY FROM: patient  HISTORY OF PRESENT ILLNESS:  HISTORY She was referred by her primary care physician Dr. 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 MRI scan of the brain in 2014, shows extensive changes of chroniic microvascular ischemia and moderate degree of generalized cerebral atrophy and ventricular enlargement which appear slightly more advanced compared with previous MRI dated 06/03/2006. She began to have episodes of seizure-like event since 2013, she felt overheated, then become confused, blank stares, then slump down, body jerking movement,   For a while, she continue have recurrent episode on titrating dose of Keppra up to 1500 mg twice a day, Vimpat 100 mg twice a day, EEG in March 2016 showed bifrontal delta slowing, with right hemispheric predominance. This is consistent with FIRDA.  Upon further questioning, all episode happened in a standing position, she was on 4 agents treatment for her hypertension, lisinopril 40 mg in the morning, amlodipine 5 mg in the morning, hydrochlorothiazide 25 mg in the morning, atenolol 300 mg twice a day, all the episodes clustered in the morning time, shortly after she take her blood pressure medications.  I have decreased her blood pressure medication since May 2016, lisinopril 20 mg twice a day, amlodipine 5 mg every night, atenolol 300 mg every morning, stopped hydrochlorothiazide, and potassium supplement,  She no longer has recurrent passing out episode, seizure-like event, doing very well, continue has mild memory trouble,    UPDATE May 27 2015: She is now taking Keppra 1000 mg twice a day, there was no recurrent seizure like activity, she is overall doing well, she continue have mild unsteady gait, right hip pain, ambulate with a cane, I will continue to decrease her Keppra to 500 mg twice a day,  UPDATE Aug 27 2015:  She is overall doing very well, there was no recurrent passing out episode, she was put on lower dose Keppra 500 mg twice a day since November 2016, daughter did reported previously she had one typical episode consistent with complex partial seizure, sitting at Dr. Raphael Gibney office, had sudden loss of muscle tone, staring spells, followed by body jerking movement, lasting for few minutes, post event confusion,  Today her blood pressure is mildly elevated 150/90, I have advised her continue to document her blood pressure daily ANA was negative, no evidence of connective tissue disease  Update September 27 2016. Last visit was on June 29 7016 with nurse practitioner Aundra Millet, she reported woke up on the floor the day prior on June 28 2016, she was sitting at the edge of the bed, dizziness, next thing she woke up, her denture has been throughout of her mouth, she had urinary incontinence, she began to have recurrent episode of dizziness, lightheadedness again, also complains of dizziness when she turning over in bed,  She has worsening memory loss, Mini-Mental Status Examination 27/30,   We have personally reviewed MRI of the brain in 2015, significant generalized atrophy, supratentorium small vessel disease, she denies a family history of memory loss,  Today we will able to show significant orthostatic blood pressure changes, lying down blood pressure 104/96, 69, standing up  159/88, 73, 5 minutes standing up 163/92, 69  She is on polypharmacy for blood pressure treatment, atenolol 300 mg every morning, Norvasc 5 mg every night, lisinopril 20 mg every night  She also complains of bilateral feet  paresthesia, mild length dependent sensory changes  Today 08/15/17 Teresa Riley is an 83 year old female with a history of syncopal events.  She returns today for follow-up.  She is here today with her two daughters.  She reports that she passed out January 23.  Her daughter reports that she found her in the kitchen floor.  The patient states that she got up and was completing her morning routine.  She went into the kitchen to take her medication and that all she remembers.  Her daughter states the week before she did complain of 2 episodes of dizziness.  The patient did not lose bowel or bladder or bite her tongue during the syncopal event.  In the past she has been on Keppra but despite higher doses of Keppra continued to have syncopal events.  It was felt that these were not seizure events therefore she was weaned off the medication.  She has been off Keppra since September and has not had any syncopal events until January.  The daughters report that she takes all of her blood pressure medication at bedtime.  In the past she has had issues with orthostatic hypotension.  She has had a thorough workup with cardiology and primary care provider.  The syncopal event has been ongoing since 2012.  Patient memory has remained stable according to the family.  She lives at home with her daughter.  Is able to complete all ADLs independently.  She does not operate a motor vehicle.  She returns today for an evaluation.  REVIEW OF SYSTEMS: Out of a complete 14 system review of symptoms, the patient complains only of the following symptoms, and all other reviewed systems are negative.  See HPI  ALLERGIES: No Known Allergies  HOME MEDICATIONS: Outpatient Medications Prior to Visit  Medication Sig Dispense Refill  . albuterol (PROAIR HFA) 108 (90 Base) MCG/ACT inhaler Inhale 2 puffs into the lungs every 6 (six) hours as needed for wheezing or shortness of breath. 18 g 11  . ALPRAZolam (XANAX) 0.25 MG tablet TAKE 1  TABLET BY MOUTH 2 TIMES DAILY AS NEEDED 60 tablet 5  . amLODipine (NORVASC) 10 MG tablet Take 1 tablet (10 mg total) by mouth daily. 90 tablet 3  . aspirin EC 81 MG tablet Take 81 mg by mouth daily with breakfast.    . atorvastatin (LIPITOR) 10 MG tablet Take 1 tablet (10 mg total) by mouth daily. 90 tablet 3  . atorvastatin (LIPITOR) 10 MG tablet TAKE 1 TABLET BY MOUTH ONCE DAILY 90 tablet 0  . Calcium-Magnesium-Vitamin D (CALCIUM MAGNESIUM PO) Take 1 tablet by mouth daily.    . cycloSPORINE (RESTASIS) 0.05 % ophthalmic emulsion Place 1 drop into both eyes at bedtime.    Marland Kitchen denosumab (PROLIA) 60 MG/ML SOLN injection Inject 60 mg into the skin every 6 (six) months. Administer in upper arm, thigh, or abdomen    . fluocinonide cream (LIDEX) 0.05 % Apply topically 2 (two) times daily. 30 g 1  . fluticasone (FLONASE) 50 MCG/ACT nasal spray instill 1 spray into each nostril once daily (Patient taking differently: instill 1 spray into each nostril once daily, as needed.) 16 g 2  . hydrocortisone (ANUSOL-HC) 25 MG suppository Place 1 suppository (25 mg total) rectally every 12 (twelve) hours.  12 suppository 1  . labetalol (NORMODYNE) 300 MG tablet take 1 tablet by mouth twice a day 180 tablet 2  . lisinopril (PRINIVIL,ZESTRIL) 20 MG tablet Take 1 tablet (20 mg total) by mouth 2 (two) times daily. 180 tablet 3  . loratadine (CLARITIN) 10 MG tablet Take 10 mg by mouth daily.    . meclizine (ANTIVERT) 12.5 MG tablet TAKE 1 TABLET BY MOUTH 3 TIMES DAILY AS NEEDED FOR DIZZINESS 30 tablet 2  . Memantine HCl-Donepezil HCl (NAMZARIC) 28-10 MG CP24 Take 1 capsule by mouth at bedtime. 90 capsule 4  . Multiple Vitamin (MULTIVITAMIN WITH MINERALS) TABS tablet Take 1 tablet by mouth daily.    . Omega-3 Fatty Acids (OMEGA 3 PO) Take 1 capsule by mouth daily.    Bertram Gala Glycol-Propyl Glycol (SYSTANE OP) Place 2-4 drops into both eyes 4 (four) times daily as needed (for dry eyes).     . tamsulosin (FLOMAX) 0.4 MG  CAPS capsule Take 1 capsule (0.4 mg total) by mouth daily. 90 capsule 3  . traZODone (DESYREL) 50 MG tablet Take 1 tablet (50 mg total) by mouth at bedtime. 90 tablet 1   No facility-administered medications prior to visit.     PAST MEDICAL HISTORY: Past Medical History:  Diagnosis Date  . Allergic rhinitis 09/05/2014  . Allergic rhinitis, cause unspecified 10/29/2010  . Chronic constipation 10/29/2010  . CKD (chronic kidney disease) stage 3, GFR 30-59 ml/min (HCC) 04/28/2017  . Eczema 10/29/2010  . Gait difficulty   . H/O: hysterectomy 10/29/2010  . History of cervical cancer 10/29/2010  . HTN (hypertension) 10/29/2010  . Hyperlipidemia   . Hypertension   . Impaired glucose tolerance 08/16/2012  . Loss of appetite   . Memory loss   . Orthostatic hypotension 06/05/2012  . Osteoporosis   . Right hip pain   . Seizures (HCC)   . Vascular dementia   . Vitamin D deficiency 09/05/2014    PAST SURGICAL HISTORY: Past Surgical History:  Procedure Laterality Date  . ABDOMINAL HYSTERECTOMY  1970  . BREAST BIOPSY  1980's   benign  . CATARACT EXTRACTION W/ INTRAOCULAR LENS  IMPLANT, BILATERAL  01/2011  . EYE SURGERY      FAMILY HISTORY: Family History  Problem Relation Age of Onset  . Hypertension Mother   . Hypertension Father   . Diabetes Father   . High blood pressure Daughter        x2  . Colon cancer Neg Hx   . Colon polyps Neg Hx   . Kidney disease Neg Hx   . Gallbladder disease Neg Hx     SOCIAL HISTORY: Social History   Socioeconomic History  . Marital status: Divorced    Spouse name: Not on file  . Number of children: 3  . Years of education: 2  . Highest education level: Not on file  Social Needs  . Financial resource strain: Not on file  . Food insecurity - worry: Not on file  . Food insecurity - inability: Not on file  . Transportation needs - medical: Not on file  . Transportation needs - non-medical: Not on file  Occupational History  . Occupation: retired  Runner, broadcasting/film/video  Tobacco Use  . Smoking status: Former Smoker    Types: Cigarettes    Last attempt to quit: 07/13/1971    Years since quitting: 46.1  . Smokeless tobacco: Never Used  . Tobacco comment: quit in 1973  Substance and Sexual Activity  . Alcohol use: No  Alcohol/week: 0.0 oz  . Drug use: No  . Sexual activity: No  Other Topics Concern  . Not on file  Social History Narrative   Patient lives at home with two daughters Wadie Lessen(Angela Moore) Anda Latina(Freda Mitchell). Patient is retired. Patient has two years college.      PHYSICAL EXAM  Vitals:   08/15/17 1330  BP: (!) 169/85  Pulse: 73  Weight: 145 lb 9.6 oz (66 kg)  Height: 5\' 4"  (1.626 m)   Body mass index is 24.99 kg/m.  Orthostatic VS for the past 24 hrs:  BP- Lying Pulse- Lying BP- Sitting Pulse- Sitting BP- Standing at 0 minutes Pulse- Standing at 0 minutes  08/15/17 1350 157/81 70 143/78 72 122/71 78     MMSE - Mini Mental State Exam 08/15/2017 03/31/2017 09/27/2016  Orientation to time 5 4 4   Orientation to Place 5 5 5   Registration 3 3 3   Attention/ Calculation 0 5 5  Recall 3 2 1   Language- name 2 objects 2 2 2   Language- repeat 1 1 1   Language- follow 3 step command 3 3 3   Language- read & follow direction 1 1 1   Write a sentence 1 1 1   Copy design 0 1 1  Total score 24 28 27     Generalized: Well developed, in no acute distress   Neurological examination  Mentation: Alert oriented to time, place, history taking. Follows all commands speech and language fluent Cranial nerve II-XII: Pupils were equal round reactive to light. Extraocular movements were full, visual field were full on confrontational test. Facial sensation and strength were normal. Uvula tongue midline. Head turning and shoulder shrug  were normal and symmetric. Motor: The motor testing reveals 5 over 5 strength of all 4 extremities. Good symmetric motor tone is noted throughout.  Sensory: Sensory testing is intact to soft touch on all 4 extremities.  No evidence of extinction is noted.  Coordination: Cerebellar testing reveals good finger-nose-finger and heel-to-shin bilaterally.  Gait and station: Gait is normal. Tandem gait is normal. Romberg is negative. No drift is seen.  Reflexes: Deep tendon reflexes are symmetric and normal bilaterally.   DIAGNOSTIC DATA (LABS, IMAGING, TESTING) - I reviewed patient records, labs, notes, testing and imaging myself where available.  Lab Results  Component Value Date   WBC 5.5 04/28/2017   HGB 12.3 04/28/2017   HCT 38.0 04/28/2017   MCV 98.7 04/28/2017   PLT 232.0 04/28/2017      Component Value Date/Time   NA 140 04/28/2017 1435   NA 143 09/27/2016 1700   K 3.9 04/28/2017 1435   CL 101 04/28/2017 1435   CO2 33 (H) 04/28/2017 1435   GLUCOSE 105 (H) 04/28/2017 1435   BUN 16 04/28/2017 1435   BUN 13 09/27/2016 1700   CREATININE 1.33 (H) 04/28/2017 1435   CALCIUM 10.0 04/28/2017 1435   PROT 7.7 04/28/2017 1435   PROT 7.7 09/27/2016 1700   ALBUMIN 4.2 04/28/2017 1435   ALBUMIN 4.3 09/27/2016 1700   AST 28 04/28/2017 1435   ALT 14 04/28/2017 1435   ALKPHOS 43 04/28/2017 1435   BILITOT 0.7 04/28/2017 1435   BILITOT 0.4 09/27/2016 1700   GFRNONAA 37 (L) 09/27/2016 1700   GFRAA 43 (L) 09/27/2016 1700   Lab Results  Component Value Date   CHOL 197 09/16/2015   HDL 68.50 09/16/2015   LDLCALC 92 09/05/2014   LDLDIRECT 93.0 09/16/2015   TRIG 244.0 (H) 09/16/2015   CHOLHDL 3 09/16/2015   Lab  Results  Component Value Date   HGBA1C 5.5 09/27/2016   Lab Results  Component Value Date   VITAMINB12 1,750 (H) 09/27/2016   Lab Results  Component Value Date   TSH 1.980 09/27/2016      ASSESSMENT AND PLAN 83 y.o. year old female  has a past medical history of Allergic rhinitis (09/05/2014), Allergic rhinitis, cause unspecified (10/29/2010), Chronic constipation (10/29/2010), CKD (chronic kidney disease) stage 3, GFR 30-59 ml/min (HCC) (04/28/2017), Eczema (10/29/2010), Gait difficulty,  H/O: hysterectomy (10/29/2010), History of cervical cancer (10/29/2010), HTN (hypertension) (10/29/2010), Hyperlipidemia, Hypertension, Impaired glucose tolerance (08/16/2012), Loss of appetite, Memory loss, Orthostatic hypotension (06/05/2012), Osteoporosis, Right hip pain, Seizures (HCC), Vascular dementia, and Vitamin D deficiency (09/05/2014). here with :  1.  Syncopal event 2. memory disturbance  The patient will continue on Aricept and Namenda for her memory.  We will continue to monitor this over time.  The patient had an additional syncopal event.  I will repeat an EEG to look for any abnormalities.  Orthostatic vital signs were completed on the patient and they showed a significant change in her blood pressure from a lying to standing position.  However with the most recent syncopal event a position change did not proceed the event.  We will wait for EEG results before deciding next step in treatment.  She is advised that if she has any additional events she should let us know.  She will follow-up in 6 months or sooner if needed.   Butch Penny, MSN, NP-C 08/15/2017, 1:25 PM [facility] [address], [address] [place], [place] [address] [phone]

## 2017-08-16 ENCOUNTER — Encounter: Payer: Self-pay | Admitting: Adult Health

## 2017-08-18 NOTE — Progress Notes (Signed)
I have reviewed and agreed above plan. 

## 2017-08-22 ENCOUNTER — Other Ambulatory Visit: Payer: Self-pay | Admitting: Internal Medicine

## 2017-08-23 DIAGNOSIS — N183 Chronic kidney disease, stage 3 (moderate): Secondary | ICD-10-CM | POA: Diagnosis not present

## 2017-08-23 DIAGNOSIS — I951 Orthostatic hypotension: Secondary | ICD-10-CM | POA: Diagnosis not present

## 2017-08-23 DIAGNOSIS — I129 Hypertensive chronic kidney disease with stage 1 through stage 4 chronic kidney disease, or unspecified chronic kidney disease: Secondary | ICD-10-CM | POA: Diagnosis not present

## 2017-08-24 DIAGNOSIS — N183 Chronic kidney disease, stage 3 (moderate): Secondary | ICD-10-CM | POA: Diagnosis not present

## 2017-08-25 ENCOUNTER — Other Ambulatory Visit: Payer: Medicare HMO

## 2017-08-26 ENCOUNTER — Encounter: Payer: Self-pay | Admitting: Neurology

## 2017-09-15 ENCOUNTER — Ambulatory Visit (INDEPENDENT_AMBULATORY_CARE_PROVIDER_SITE_OTHER): Payer: Medicare HMO | Admitting: Neurology

## 2017-09-15 DIAGNOSIS — R55 Syncope and collapse: Secondary | ICD-10-CM

## 2017-09-21 ENCOUNTER — Other Ambulatory Visit: Payer: Self-pay | Admitting: Internal Medicine

## 2017-09-24 NOTE — Procedures (Signed)
   HISTORY: 83 years old female, with history of short-term memory loss, presented with confusion episodes.  TECHNIQUE:  16 channel EEG was performed based on standard 10-16 international system. One channel was dedicated to EKG, which has demonstrates normal sinus rhythm of 66 beats per minutes.  Upon awakening, the posterior background activity was well-developed, in alpha range, 9 Hz, reactive to eye opening and closure.  There was no evidence of epileptiform discharge.  There was frequent muscle artifact  Photic stimulation was performed, which induced a symmetric photic driving.  Hyperventilation was performed, there was no abnormality elicit.  No sleep was achieved.  CONCLUSION: This is a  normal awake EEG.  There is no electrodiagnostic evidence of epileptiform discharge.  Levert FeinsteinYijun Tayte Mcwherter, M.D. Ph.D.  [facility] [address] [place], [place] [address] Phone: [phone] Fax:      [phone]

## 2017-09-28 ENCOUNTER — Telehealth: Payer: Self-pay | Admitting: *Deleted

## 2017-09-28 NOTE — Telephone Encounter (Signed)
LVM for daughter, [name] on [place] informing her the patient's EEG was normal. Advised the patient contact office for any events and call before FU in Aug for any problems, questions. Left number for any questions.

## 2017-10-13 ENCOUNTER — Ambulatory Visit: Payer: Medicare HMO | Admitting: Neurology

## 2017-10-31 ENCOUNTER — Telehealth: Payer: Self-pay | Admitting: Internal Medicine

## 2017-10-31 NOTE — Telephone Encounter (Signed)
Called patient regarding scheduling her Prolia injection. Insurance was verified and states that it is a $0 copay if she picks it up from the pharmacy and brings it to us. Teresa Riley, can you send this in for her please? Appointment scheduled for Monday, 11/07/17 as long as the pharmacy is able to supply the medication at that time.

## 2017-10-31 NOTE — Telephone Encounter (Signed)
Copied from CRM [id number]. Topic: Quick Communication - See Telephone Encounter >> Oct 31, 2017  3:15 PM Floria RavelingStovall, Shana A wrote: CRM for notification. See Telephone encounter for: 10/31/17. Pt daughter called in and wanted to know if it was time for pt 2nd Prolia, and if so she would like to go head and get it authorized so pt can sch appt   Best number  [phone]- [name]

## 2017-11-01 ENCOUNTER — Other Ambulatory Visit: Payer: Self-pay

## 2017-11-01 MED ORDER — DENOSUMAB 60 MG/ML ~~LOC~~ SOSY: 60.0000 mg | PREFILLED_SYRINGE | Freq: Once | SUBCUTANEOUS | 0 refills | Status: DC

## 2017-11-01 NOTE — Telephone Encounter (Signed)
prolia has been called to [facility] pharm---left message advising angela (on patient's answering machine) that prolia has been sent to pharm---can talk with Asa Fath if any further questions

## 2017-11-04 ENCOUNTER — Other Ambulatory Visit: Payer: Self-pay | Admitting: Internal Medicine

## 2017-11-04 NOTE — Telephone Encounter (Signed)
Pts daughter states that she went to pick up the Prolia injection at the pharmacy and the pharmacy stated that the pts insurance will not cover the injection. Please call daughter.

## 2017-11-04 NOTE — Telephone Encounter (Signed)
Per [facility] pharm, patient has to use medicare part D for this med----;patient's daughter is calling [employer] to see if mother qualifies for assistance, if yes, she will bring auth # to me---if not, she will pay $200 copay on 4/29---either way, she wants to keep appt on 4/29 and get prolia injection----if no assistance thru [employer], estimated copay is $200 for 4/29 visit---daughter advised that the actual bill from insurance could be more than estimated copay for 4/29 visit

## 2017-11-04 NOTE — Telephone Encounter (Signed)
Patient has medicaid insurance,too---in the past, we have sent prolia to pharmacy and medicaid has paid for prolia when using this process---I have left message for patient's daughter,Angela to call me back---let Alfred Harrel,RN at [facility] office know when daughter calls back so that I can talk directly to her

## 2017-11-07 ENCOUNTER — Other Ambulatory Visit: Payer: Self-pay | Admitting: Internal Medicine

## 2017-11-07 ENCOUNTER — Ambulatory Visit (INDEPENDENT_AMBULATORY_CARE_PROVIDER_SITE_OTHER): Payer: Medicare HMO

## 2017-11-07 ENCOUNTER — Ambulatory Visit: Payer: Medicare HMO

## 2017-11-07 DIAGNOSIS — M810 Age-related osteoporosis without current pathological fracture: Secondary | ICD-10-CM

## 2017-11-07 MED ORDER — DENOSUMAB 60 MG/ML ~~LOC~~ SOSY: 60.0000 mg | PREFILLED_SYRINGE | Freq: Once | SUBCUTANEOUS | 0 refills | Status: AC

## 2017-11-07 MED ORDER — DENOSUMAB 60 MG/ML ~~LOC~~ SOSY
60.0000 mg | PREFILLED_SYRINGE | Freq: Once | SUBCUTANEOUS | Status: AC
  Administered 2017-11-07: 60 mg via SUBCUTANEOUS

## 2017-11-07 NOTE — Progress Notes (Signed)
Medical screening examination/treatment/procedure(s) were performed by non-physician practitioner and as supervising physician I was immediately available for consultation/collaboration. I agree with above. Aisea Bouldin, MD   

## 2017-11-08 ENCOUNTER — Telehealth: Payer: Self-pay | Admitting: Internal Medicine

## 2017-11-08 MED ORDER — ALPRAZOLAM 0.25 MG PO TABS: ORAL_TABLET | ORAL | 5 refills | Status: DC

## 2017-11-08 NOTE — Telephone Encounter (Signed)
Xanax done erx 

## 2017-11-11 ENCOUNTER — Ambulatory Visit: Payer: Medicare HMO | Admitting: Internal Medicine

## 2017-11-14 ENCOUNTER — Ambulatory Visit: Payer: Self-pay

## 2017-11-14 NOTE — Telephone Encounter (Signed)
Pt.'s daughter reports pt. Has had problems emptying her bladder in the past and this has been on-going. States she will "eventually empty after she sits a little while."Over the past week seems to be worse. Denies any pain or discomfort or burning. Eating and drinking well. No other problems. Agent made an appointment for tomorrow.  Answer Assessment - Initial Assessment Questions 1. SYMPTOM: "What's the main symptom you're concerned about?" (e.g., frequency, incontinence)     Not emptying bladder 2. ONSET: "When did the  ________  start?"     1 Week - gotten worse 3. PAIN: "Is there any pain?" If so, ask: "How bad is it?" (Scale: 1-10; mild, moderate, severe)     No pain 4. CAUSE: "What do you think is causing the symptoms?"     She has had this before 5. OTHER SYMPTOMS: "Do you have any other symptoms?" (e.g., fever, flank pain, blood in urine, pain with urination)     No 6. PREGNANCY: "Is there any chance you are pregnant?" "When was your last menstrual period?"     No  Protocols used: URINARY [facility]

## 2017-11-14 NOTE — Telephone Encounter (Signed)
Noted  

## 2017-11-15 ENCOUNTER — Encounter: Payer: Self-pay | Admitting: Internal Medicine

## 2017-11-15 ENCOUNTER — Other Ambulatory Visit (INDEPENDENT_AMBULATORY_CARE_PROVIDER_SITE_OTHER): Payer: Medicare HMO

## 2017-11-15 ENCOUNTER — Ambulatory Visit (INDEPENDENT_AMBULATORY_CARE_PROVIDER_SITE_OTHER): Payer: Medicare HMO | Admitting: Internal Medicine

## 2017-11-15 VITALS — BP 154/88 | HR 69 | Temp 98.0°F | Ht 64.0 in | Wt 155.0 lb

## 2017-11-15 DIAGNOSIS — M48062 Spinal stenosis, lumbar region with neurogenic claudication: Secondary | ICD-10-CM | POA: Insufficient documentation

## 2017-11-15 DIAGNOSIS — I10 Essential (primary) hypertension: Secondary | ICD-10-CM

## 2017-11-15 DIAGNOSIS — N183 Chronic kidney disease, stage 3 unspecified: Secondary | ICD-10-CM

## 2017-11-15 DIAGNOSIS — R339 Retention of urine, unspecified: Secondary | ICD-10-CM

## 2017-11-15 DIAGNOSIS — G9519 Other vascular myelopathies: Secondary | ICD-10-CM | POA: Insufficient documentation

## 2017-11-15 DIAGNOSIS — Z0000 Encounter for general adult medical examination without abnormal findings: Secondary | ICD-10-CM | POA: Diagnosis not present

## 2017-11-15 LAB — URINALYSIS, ROUTINE W REFLEX MICROSCOPIC
Bilirubin Urine: NEGATIVE
Hgb urine dipstick: NEGATIVE
Ketones, ur: NEGATIVE
Leukocytes, UA: NEGATIVE
Nitrite: NEGATIVE
Specific Gravity, Urine: 1.01 (ref 1.000–1.030)
Total Protein, Urine: NEGATIVE
Urine Glucose: NEGATIVE
Urobilinogen, UA: 0.2 (ref 0.0–1.0)
WBC, UA: NONE SEEN
pH: 7 (ref 5.0–8.0)

## 2017-11-15 MED ORDER — GABAPENTIN 100 MG PO CAPS
100.0000 mg | ORAL_CAPSULE | Freq: Three times a day (TID) | ORAL | 5 refills | Status: DC
Start: 2017-11-15 — End: 2017-11-15

## 2017-11-15 MED ORDER — TAMSULOSIN HCL 0.4 MG PO CAPS: 0.4000 mg | ORAL_CAPSULE | Freq: Two times a day (BID) | ORAL | 3 refills | Status: DC

## 2017-11-15 NOTE — Patient Instructions (Addendum)
Ok to increase the flomax to twice per day  Please continue all other medications as before, and refills have been done if requested.  Please have the pharmacy call with any other refills you may need.  Please continue your efforts at being more active, low cholesterol diet, and weight control.  You are otherwise up to date with prevention measures today.  Please keep your appointments with your specialists as you may have planned  Please go to the LAB in the Basement (turn left off the elevator) for the tests to be done today - just the urine testing today  You will be contacted by phone if any changes need to be made immediately.  Otherwise, you will receive a letter about your results with an explanation, but please check with MyChart first.  Please remember to sign up for MyChart if you have not done so, as this will be important to you in the future with finding out test results, communicating by private email, and scheduling acute appointments online when needed.

## 2017-11-15 NOTE — Progress Notes (Signed)
Subjective:    Patient ID: Teresa Riley, female    DOB: [birth year], 83 y.o.   MRN: [id number]  HPI  Here with ongoing maybe worse in last few months urinary retention, just hard to get started with urination, overall some better with 2 flomax per day in the last 2 days. Denies urinary symptoms such as dysuria, frequency, urgency, flank pain, hematuria or n/v, fever, chills. Pt denies chest pain, increased sob or doe, wheezing, orthopnea, PND, increased LE swelling, palpitations, dizziness or syncope.   Pt denies polydipsia, polyuria Pt denies new neurological symptoms such as new headache, or facial or extremity weakness or numbness Wt Readings from Last 3 Encounters:  11/15/17 155 lb (70.3 kg)  08/15/17 145 lb 9.6 oz (66 kg)  04/28/17 135 lb (61.2 kg)   Past Medical History:  Diagnosis Date  . Allergic rhinitis 09/05/2014  . Allergic rhinitis, cause unspecified 10/29/2010  . Chronic constipation 10/29/2010  . CKD (chronic kidney disease) stage 3, GFR 30-59 ml/min (HCC) 04/28/2017  . Eczema 10/29/2010  . Gait difficulty   . H/O: hysterectomy 10/29/2010  . History of cervical cancer 10/29/2010  . HTN (hypertension) 10/29/2010  . Hyperlipidemia   . Hypertension   . Impaired glucose tolerance 08/16/2012  . Loss of appetite   . Memory loss   . Orthostatic hypotension 06/05/2012  . Osteoporosis   . Right hip pain   . Seizures (HCC)   . Vascular dementia   . Vitamin D deficiency 09/05/2014   Past Surgical History:  Procedure Laterality Date  . ABDOMINAL HYSTERECTOMY  1970  . BREAST BIOPSY  1980's   benign  . CATARACT EXTRACTION W/ INTRAOCULAR LENS  IMPLANT, BILATERAL  01/2011  . EYE SURGERY      reports that she quit smoking about 46 years ago. Her smoking use included cigarettes. She has never used smokeless tobacco. She reports that she does not drink alcohol or use drugs. family history includes Diabetes in her father; High blood pressure in her daughter; Hypertension in her father and  mother. No Known Allergies Current Outpatient Medications on File Prior to Visit  Medication Sig Dispense Refill  . albuterol (PROAIR HFA) 108 (90 Base) MCG/ACT inhaler Inhale 2 puffs into the lungs every 6 (six) hours as needed for wheezing or shortness of breath. 18 g 11  . ALPRAZolam (XANAX) 0.25 MG tablet TAKE 1 TABLET BY MOUTH 2 TIMES DAILY AS NEEDED 60 tablet 5  . amLODipine (NORVASC) 10 MG tablet Take 1 tablet (10 mg total) by mouth daily. 90 tablet 3  . aspirin EC 81 MG tablet Take 81 mg by mouth daily with breakfast.    . atorvastatin (LIPITOR) 10 MG tablet TAKE 1 TABLET BY MOUTH ONCE DAILY 90 tablet 0  . Calcium-Magnesium-Vitamin D (CALCIUM MAGNESIUM PO) Take 1 tablet by mouth daily.    . cycloSPORINE (RESTASIS) 0.05 % ophthalmic emulsion Place 1 drop into both eyes at bedtime.    Marland Kitchen denosumab (PROLIA) 60 MG/ML SOLN injection Inject 60 mg into the skin every 6 (six) months. Administer in upper arm, thigh, or abdomen    . fluocinonide cream (LIDEX) 0.05 % Apply topically 2 (two) times daily. 30 g 1  . fluticasone (FLONASE) 50 MCG/ACT nasal spray instill 1 spray into each nostril once daily (Patient taking differently: instill 1 spray into each nostril once daily, as needed.) 16 g 2  . hydrocortisone (ANUSOL-HC) 25 MG suppository Place 1 suppository (25 mg total) rectally every 12 (twelve) hours. 12  suppository 1  . labetalol (NORMODYNE) 300 MG tablet TAKE 1 TABLET BY MOUTH TWICE A DAY 180 tablet 1  . lisinopril (PRINIVIL,ZESTRIL) 20 MG tablet Take 1 tablet (20 mg total) by mouth 2 (two) times daily. 180 tablet 3  . loratadine (CLARITIN) 10 MG tablet Take 10 mg by mouth daily.    . meclizine (ANTIVERT) 12.5 MG tablet TAKE 1 TABLET BY MOUTH 3 TIMES DAILY AS NEEDED FOR DIZZINESS 30 tablet 2  . Memantine HCl-Donepezil HCl (NAMZARIC) 28-10 MG CP24 Take 1 capsule by mouth at bedtime. 90 capsule 4  . Multiple Vitamin (MULTIVITAMIN WITH MINERALS) TABS tablet Take 1 tablet by mouth daily.    .  Omega-3 Fatty Acids (OMEGA 3 PO) Take 1 capsule by mouth daily.    Bertram Gala Glycol-Propyl Glycol (SYSTANE OP) Place 2-4 drops into both eyes 4 (four) times daily as needed (for dry eyes).     . PROLIA 60 MG/ML SOSY injection INJECT  INTO THE SKIN ONCE 1 mL 0   No current facility-administered medications on file prior to visit.    Review of Systems  Constitutional: Negative for other unusual diaphoresis or sweats HENT: Negative for ear discharge or swelling Eyes: Negative for other worsening visual disturbances Respiratory: Negative for stridor or other swelling  Gastrointestinal: Negative for worsening distension or other blood Genitourinary: Negative for retention or other urinary change Musculoskeletal: Negative for other MSK pain or swelling Skin: Negative for color change or other new lesions Neurological: Negative for worsening tremors and other numbness  Psychiatric/Behavioral: Negative for worsening agitation or other fatigue All other system neg per pt    Objective:   Physical Exam BP (!) 154/88   Pulse 69   Temp 98 F (36.7 C) (Oral)   Ht  (1.626 m)   Wt 155 lb (70.3 kg)   SpO2 98%   BMI 26.61 kg/m  VS noted,  Constitutional: Pt appears in NAD HENT: Head: NCAT.  Right Ear: External ear normal.  Left Ear: External ear normal.  Eyes: . Pupils are equal, round, and reactive to light. Conjunctivae and EOM are normal Nose: without d/c or deformity Neck: Neck supple. Gross normal ROM Cardiovascular: Normal rate and regular rhythm.   Pulmonary/Chest: Effort normal and breath sounds without rales or wheezing.  Abd:  Soft, NT, ND, + BS, no organomegaly Neurological: Pt is alert. At baseline orientation, motor grossly intact Skin: Skin is warm. No rashes, other new lesions, no LE edema Psychiatric: Pt behavior is normal without agitation  No other exam findings    Assessment & Plan:

## 2017-11-15 NOTE — Assessment & Plan Note (Signed)
Remains mild elevated on multiple meds, o/w stable overall by history and exam, recent data reviewed with pt, and pt to continue medical treatment as before,  to f/u any worsening symptoms or concerns BP Readings from Last 3 Encounters:  11/15/17 (!) 154/88  08/15/17 (!) 169/85  04/28/17 (!) 144/86

## 2017-11-15 NOTE — Assessment & Plan Note (Signed)
stable overall by history and exam, recent data reviewed with pt, and pt to continue medical treatment as before,  to f/u any worsening symptoms or concerns Lab Results  Component Value Date   CREATININE 1.33 (H) 04/28/2017

## 2017-11-15 NOTE — Assessment & Plan Note (Signed)
Etiology unclear, for UA and cx, consider antibx pending results, ok for flomax bid, consider urology referral

## 2017-11-16 ENCOUNTER — Telehealth: Payer: Self-pay

## 2017-11-16 LAB — URINE CULTURE
MICRO NUMBER:: 90555255
SPECIMEN QUALITY: ADEQUATE

## 2017-11-16 NOTE — Telephone Encounter (Signed)
Pt's daughter, [name], has been informed and expressed understanding.

## 2017-11-16 NOTE — Telephone Encounter (Signed)
-----   Message from Corwin Levins, MD sent at 11/15/2017  4:07 PM EDT ----- Rip Harbour for Teresa Riley to let duaghter know - UA is OK, but we will follow up on the culture, no need for antibx at this time

## 2017-12-24 ENCOUNTER — Emergency Department (HOSPITAL_COMMUNITY)
Admission: EM | Admit: 2017-12-24 | Discharge: 2017-12-24 | Disposition: A | Discharge status: Home / Self Care | Payer: Medicare HMO | Attending: Emergency Medicine | Admitting: Emergency Medicine

## 2017-12-24 ENCOUNTER — Other Ambulatory Visit: Payer: Self-pay

## 2017-12-24 ENCOUNTER — Emergency Department (HOSPITAL_COMMUNITY): Payer: Medicare HMO

## 2017-12-24 DIAGNOSIS — R55 Syncope and collapse: Secondary | ICD-10-CM | POA: Diagnosis not present

## 2017-12-24 DIAGNOSIS — Z79899 Other long term (current) drug therapy: Secondary | ICD-10-CM | POA: Insufficient documentation

## 2017-12-24 DIAGNOSIS — I129 Hypertensive chronic kidney disease with stage 1 through stage 4 chronic kidney disease, or unspecified chronic kidney disease: Secondary | ICD-10-CM | POA: Diagnosis not present

## 2017-12-24 DIAGNOSIS — I4430 Unspecified atrioventricular block: Secondary | ICD-10-CM | POA: Diagnosis not present

## 2017-12-24 DIAGNOSIS — N183 Chronic kidney disease, stage 3 (moderate): Secondary | ICD-10-CM | POA: Diagnosis not present

## 2017-12-24 DIAGNOSIS — Z87891 Personal history of nicotine dependence: Secondary | ICD-10-CM | POA: Insufficient documentation

## 2017-12-24 DIAGNOSIS — I440 Atrioventricular block, first degree: Secondary | ICD-10-CM | POA: Diagnosis not present

## 2017-12-24 LAB — CBC WITH DIFFERENTIAL/PLATELET
Abs Immature Granulocytes: 0 10*3/uL (ref 0.0–0.1)
BASOS ABS: 0 10*3/uL (ref 0.0–0.1)
BASOS PCT: 1 %
EOS PCT: 2 %
Eosinophils Absolute: 0.1 10*3/uL (ref 0.0–0.7)
HCT: 39.2 % (ref 36.0–46.0)
HEMOGLOBIN: 12.4 g/dL (ref 12.0–15.0)
Immature Granulocytes: 0 %
Lymphocytes Relative: 43 %
Lymphs Abs: 2.1 10*3/uL (ref 0.7–4.0)
MCH: 31.3 pg (ref 26.0–34.0)
MCHC: 31.6 g/dL (ref 30.0–36.0)
MCV: 99 fL (ref 78.0–100.0)
Monocytes Absolute: 0.4 10*3/uL (ref 0.1–1.0)
Monocytes Relative: 8 %
Neutro Abs: 2.3 10*3/uL (ref 1.7–7.7)
Neutrophils Relative %: 46 %
PLATELETS: 203 10*3/uL (ref 150–400)
RBC: 3.96 MIL/uL (ref 3.87–5.11)
RDW: 12.2 % (ref 11.5–15.5)
WBC: 4.9 10*3/uL (ref 4.0–10.5)

## 2017-12-24 LAB — COMPREHENSIVE METABOLIC PANEL
ALK PHOS: 46 U/L (ref 38–126)
ALT: 16 U/L (ref 14–54)
ANION GAP: 8 (ref 5–15)
AST: 26 U/L (ref 15–41)
Albumin: 3.4 g/dL — ABNORMAL LOW (ref 3.5–5.0)
BUN: 15 mg/dL (ref 6–20)
CALCIUM: 9.7 mg/dL (ref 8.9–10.3)
CO2: 27 mmol/L (ref 22–32)
Chloride: 107 mmol/L (ref 101–111)
Creatinine, Ser: 1.36 mg/dL — ABNORMAL HIGH (ref 0.44–1.00)
GFR, EST AFRICAN AMERICAN: 41 mL/min — AB (ref >60)
GFR, EST NON AFRICAN AMERICAN: 36 mL/min — AB (ref >60)
Glucose, Bld: 188 mg/dL — ABNORMAL HIGH (ref 65–99)
Potassium: 3.5 mmol/L (ref 3.5–5.1)
SODIUM: 142 mmol/L (ref 135–145)
Total Bilirubin: 0.6 mg/dL (ref 0.3–1.2)
Total Protein: 7 g/dL (ref 6.5–8.1)

## 2017-12-24 LAB — CBG MONITORING, ED: GLUCOSE-CAPILLARY: 128 mg/dL — AB (ref 65–99)

## 2017-12-24 LAB — I-STAT TROPONIN, ED: TROPONIN I, POC: 0 ng/mL (ref 0.00–0.08)

## 2017-12-24 NOTE — Discharge Instructions (Signed)
Please follow up with your PCP and neurology

## 2017-12-24 NOTE — ED Notes (Signed)
Patient transported to X-ray 

## 2017-12-24 NOTE — ED Triage Notes (Signed)
Patient was passenger in car and had a syncopal episode lasting 2-4 minutes. Patient does not remember this episode and states she was unconscious.

## 2017-12-24 NOTE — ED Notes (Signed)
Pt alert and oriented and in NAD. Pt verbalized understanding of discharge instructions.  

## 2017-12-24 NOTE — ED Provider Notes (Addendum)
[facility] EMERGENCY DEPARTMENT Provider Note   CSN: [id number] Arrival date & time: 12/24/17  1358   History   Chief Complaint Chief Complaint  Patient presents with  . Loss of Consciousness    HPI Teresa Riley is a 83 y.o. female who presents with a syncopal episode. PMH significant for CKD, HTN, HLD, hx of orthostatic hypotension. She has a previous diagnosis of seizure however after being followed by neurology for several years it was eventually determined that these episodes were more likely related to her blood pressure rather than true seizures. She states she was in her usual state of health this morning. She went with her daughter to do errands, went to lunch, and on the way home while sitting in the car she had a sudden syncopal episode. Her daughter states the patient was slumped over and was passed out for about 2-4 minutes. She pulled over and called EMS. The patient denies any current complaints and feels back to baseline. The daughter feels that this episode was the same as prior. The last time it happened was about 6 months ago. They followed up with neurology at that time and had a EEG which were negative. The patient denies fever, headache, chest pain, SOB, abdominal pain, N/V/D, dysuria. She did have an episode of incontinence. No tongue biting. No new medicines.  HPI  Past Medical History:  Diagnosis Date  . Allergic rhinitis 09/05/2014  . Allergic rhinitis, cause unspecified 10/29/2010  . Chronic constipation 10/29/2010  . CKD (chronic kidney disease) stage 3, GFR 30-59 ml/min (HCC) 04/28/2017  . Eczema 10/29/2010  . Gait difficulty   . H/O: hysterectomy 10/29/2010  . History of cervical cancer 10/29/2010  . HTN (hypertension) 10/29/2010  . Hyperlipidemia   . Hypertension   . Impaired glucose tolerance 08/16/2012  . Loss of appetite   . Memory loss   . Orthostatic hypotension 06/05/2012  . Osteoporosis   . Right hip pain   . Seizures (HCC)   .  Vascular dementia   . Vitamin D deficiency 09/05/2014    Patient Active Problem List   Diagnosis Date Noted  . CKD (chronic kidney disease) stage 3, GFR 30-59 ml/min (HCC) 04/28/2017  . Hemorrhoids 04/28/2017  . Bilateral hearing loss 11/06/2016  . Headache 11/05/2016  . Thrush 11/05/2016  . Paresthesia 09/27/2016  . Vertigo 07/04/2016  . Urinary retention 06/29/2016  . Gait disorder 06/29/2016  . General weakness 09/16/2015  . Seizures (HCC) 08/27/2015  . Dry eyes 05/27/2015  . Dry skin 03/12/2015  . Rash and nonspecific skin eruption 03/12/2015  . Renal insufficiency 03/12/2015  . Esophageal dysmotility 01/15/2015  . Colon cancer screening - declined 01/15/2015  . Syncope and collapse 10/03/2014  . Constipation 09/05/2014  . Preventative health care 09/05/2014  . Osteoporosis 09/05/2014  . Vitamin D deficiency 09/05/2014  . Allergic rhinitis 09/05/2014  . Anxiety state 09/05/2014  . Insomnia 10/02/2013  . Weight loss 04/04/2013  . Localization-related focal epilepsy with simple partial seizures (HCC) 11/15/2012  . Memory loss 11/15/2012  . Hyperlipidemia 08/06/2012  . HTN (hypertension) 10/29/2010  . Eczema 10/29/2010  . History of cervical cancer 10/29/2010    Past Surgical History:  Procedure Laterality Date  . ABDOMINAL HYSTERECTOMY  1970  . BREAST BIOPSY  1980's   benign  . CATARACT EXTRACTION W/ INTRAOCULAR LENS  IMPLANT, BILATERAL  01/2011  . EYE SURGERY       OB History   None  Home Medications    Prior to Admission medications   Medication Sig Start Date End Date Taking? Authorizing Provider  albuterol (PROAIR HFA) 108 (90 Base) MCG/ACT inhaler Inhale 2 puffs into the lungs every 6 (six) hours as needed for wheezing or shortness of breath. 04/28/17   Corwin LevinsJohn, James W, MD  ALPRAZolam Prudy Feeler(XANAX) 0.25 MG tablet TAKE 1 TABLET BY MOUTH 2 TIMES DAILY AS NEEDED 11/08/17   Corwin LevinsJohn, James W, MD  amLODipine (NORVASC) 10 MG tablet Take 1 tablet (10 mg total) by  mouth daily. 04/28/17   Corwin LevinsJohn, James W, MD  aspirin EC 81 MG tablet Take 81 mg by mouth daily with breakfast.    [provider]  atorvastatin (LIPITOR) 10 MG tablet TAKE 1 TABLET BY MOUTH ONCE DAILY 07/13/17   Corwin LevinsJohn, James W, MD  Calcium-Magnesium-Vitamin D (CALCIUM MAGNESIUM PO) Take 1 tablet by mouth daily.    [provider]  cycloSPORINE (RESTASIS) 0.05 % ophthalmic emulsion Place 1 drop into both eyes at bedtime.    [provider]  denosumab (PROLIA) 60 MG/ML SOLN injection Inject 60 mg into the skin every 6 (six) months. Administer in upper arm, thigh, or abdomen    [provider]  fluocinonide cream (LIDEX) 0.05 % Apply topically 2 (two) times daily. 04/28/17   Corwin LevinsJohn, James W, MD  fluticasone (FLONASE) 50 MCG/ACT nasal spray instill 1 spray into each nostril once daily Patient taking differently: instill 1 spray into each nostril once daily, as needed. 09/30/14   Corwin LevinsJohn, James W, MD  hydrocortisone (ANUSOL-HC) 25 MG suppository Place 1 suppository (25 mg total) rectally every 12 (twelve) hours. 03/10/17 03/10/18  Corwin LevinsJohn, James W, MD  labetalol (NORMODYNE) 300 MG tablet TAKE 1 TABLET BY MOUTH TWICE A DAY 09/21/17   Corwin LevinsJohn, James W, MD  lisinopril (PRINIVIL,ZESTRIL) 20 MG tablet Take 1 tablet (20 mg total) by mouth 2 (two) times daily. 04/28/17   Corwin LevinsJohn, James W, MD  loratadine (CLARITIN) 10 MG tablet Take 10 mg by mouth daily.    [provider]  meclizine (ANTIVERT) 12.5 MG tablet TAKE 1 TABLET BY MOUTH 3 TIMES DAILY AS NEEDED FOR DIZZINESS 07/19/16   Corwin LevinsJohn, James W, MD  Memantine HCl-Donepezil HCl [facility](NAMZARIC) 28-10 MG CP24 Take 1 capsule by mouth at bedtime. 03/31/17   Teresa FeinsteinYan, Yijun, MD  Multiple Vitamin (MULTIVITAMIN WITH MINERALS) TABS tablet Take 1 tablet by mouth daily.    [provider]  Omega-3 Fatty Acids (OMEGA 3 PO) Take 1 capsule by mouth daily.    [provider]  Polyethyl Glycol-Propyl Glycol (SYSTANE OP) Place 2-4 drops into both eyes 4  (four) times daily as needed (for dry eyes).     [provider]  PROLIA 60 MG/ML SOSY injection INJECT 60MG  INTO THE SKIN ONCE 11/07/17   Corwin LevinsJohn, James W, MD  tamsulosin (FLOMAX) 0.4 MG CAPS capsule Take 1 capsule (0.4 mg total) by mouth 2 (two) times daily. 11/15/17   Corwin LevinsJohn, James W, MD    Family History Family History  Problem Relation Age of Onset  . Hypertension Mother   . Hypertension Father   . Diabetes Father   . High blood pressure Daughter        x2  . Colon cancer Neg Hx   . Colon polyps Neg Hx   . Kidney disease Neg Hx   . Gallbladder disease Neg Hx     Social History Social History   Tobacco Use  . Smoking status: Former Smoker  Types: Cigarettes    Last attempt to quit: 07/13/1971    Years since quitting: 46.4  . Smokeless tobacco: Never Used  . Tobacco comment: quit in 1973  Substance Use Topics  . Alcohol use: No    Alcohol/week: 0.0 oz  . Drug use: No     Allergies   Patient has no known allergies.   Review of Systems Review of Systems  Constitutional: Negative for fever.  Respiratory: Negative for shortness of breath.   Cardiovascular: Negative for chest pain.  Gastrointestinal: Negative for abdominal pain, nausea and vomiting.  Genitourinary: Negative for dysuria.       +incontinence (chronic)  Musculoskeletal: Positive for myalgias (cramps in the legs).  Neurological: Positive for syncope. Negative for dizziness, seizures, weakness and headaches.  Psychiatric/Behavioral: Negative for confusion.  All other systems reviewed and are negative.    Physical Exam Updated Vital Signs BP 129/62   Pulse 64   Temp 98 F (36.7 C) (Oral)   Resp 17   Ht 5\' 3"  (1.6 m)   Wt 70.3 kg (155 lb)   SpO2 100%   BMI 27.46 kg/m   Physical Exam  Constitutional: She is oriented to person, place, and time. She appears well-developed and well-nourished. No distress.  Calm and cooperative. NAD  HENT:  Head: Normocephalic and atraumatic.  Eyes: Pupils  are equal, round, and reactive to light. Conjunctivae are normal. Right eye exhibits no discharge. Left eye exhibits no discharge. No scleral icterus.  Neck: Normal range of motion.  Cardiovascular: Normal rate and regular rhythm.  Pulmonary/Chest: Effort normal and breath sounds normal. No respiratory distress.  Abdominal: Soft. Bowel sounds are normal. She exhibits no distension. There is no tenderness.  Neurological: She is alert and oriented to person, place, and time.  Skin: Skin is warm and dry.  Psychiatric: She has a normal mood and affect. Her behavior is normal.  Nursing note and vitals reviewed.    ED Treatments / Results  Labs (all labs ordered are listed, but only abnormal results are displayed) Labs Reviewed  COMPREHENSIVE METABOLIC PANEL - Abnormal; Notable for the following components:      Result Value   Glucose, Bld 188 (*)    Creatinine, Ser 1.36 (*)    Albumin 3.4 (*)    GFR calc non Af Amer 36 (*)    GFR calc Af Amer 41 (*)    All other components within normal limits  CBG MONITORING, ED - Abnormal; Notable for the following components:   Glucose-Capillary 128 (*)    All other components within normal limits  CBC WITH DIFFERENTIAL/PLATELET  I-STAT TROPONIN, ED    EKG EKG Interpretation  Date/Time:  Saturday December 24 2017 14:05:36 EDT Ventricular Rate:  61 PR Interval:    QRS Duration: 106 QT Interval:  439 QTC Calculation: 443 R Axis:   -13 Text Interpretation:  Sinus rhythm Low voltage, precordial leads Baseline wander in lead(s) V6 No significant change since last tracing Confirmed by Gwyneth Sprout ([id number]) on 12/24/2017 2:27:36 PM   Radiology Dg Chest 2 View  Result Date: 12/24/2017 CLINICAL DATA:  82 y/o  F; syncopal episode. EXAM: CHEST - 2 VIEW COMPARISON:  11/06/2014 CT chest.  03/11/2014 chest radiograph. FINDINGS: Stable normal cardiac silhouette given projection and technique. Aortic atherosclerosis with calcification. Clear lungs. No  pleural effusion or pneumothorax. Mild reverse S curvature of the spine. No acute fracture identified. IMPRESSION: No acute pulmonary process identified.  Aortic atherosclerosis. Electronically Signed   By: Micah Noel  [place]Strat[place] M.D.   On: 12/24/2017 15:01    Procedures Procedures (including critical care time)  Medications Ordered in ED Medications - No data to display   Initial Impression / Assessment and Plan / ED Course  I have reviewed the triage vital signs and the nursing notes.  Pertinent labs & imaging results that were available during my care of the patient were reviewed by me and considered in my medical decision making (see chart for details).  83 year old female presents with a syncopal episode. This seems to be an ongoing issue for her and was thought to be related to her blood pressure. She has had an extensive work up for this problem by neurology, cardiology, and her PCP. The patient and her daughter report the problem is pretty much exactly the same as prior episodes. Her exam is unremarkable. She is asymptomatic. Her vitals are normal. Her EKG is SR. CXR is negative. She has elevated SCr from CKD and elevated glucose but otherwise labs are normal. Discussed with Dr. Lynelle Doctor. We will obtain orthostatic vitals and reassess.  Orthostatic VS for the past 24 hrs (Last 3 readings):  BP- Lying Pulse- Lying BP- Sitting Pulse- Sitting BP- Standing at 0 minutes Pulse- Standing at 0 minutes  12/24/17 1625 140/68 61 132/67 71 118/64 77   Orthostatics are technically abnormal however the patient is well appearing and not symptomatic. Shared visit with Dr. Lynelle Doctor. Will d/c with PCP and neuro f/u.  Final Clinical Impressions(s) / ED Diagnoses   Final diagnoses:  Syncope, unspecified syncope type    ED Discharge Orders    None           Bethel Born, PA-C 12/24/17 1650    Linwood Dibbles, MD 12/25/17 Jacinta Shoe

## 2017-12-26 ENCOUNTER — Telehealth: Payer: Self-pay | Admitting: Neurology

## 2017-12-26 NOTE — Telephone Encounter (Addendum)
Returned call to Anglea - we are happy to get her mother seen sooner than her August appt.  I have left a message requesting her daughter to return the call to our office.    This is an established problem with our office.  She is able to see Dr. Terrace ArabiaYan or Aundra MilletMegan.  Please schedule an earlier appt when they return the call.

## 2017-12-26 NOTE — Telephone Encounter (Signed)
Pt's daughter called to advise the pt was seen at the ED on 6/15 for passing out. She is wanting to schedule an appt, ED notes state to make an appt with PCP. Please call to advise

## 2017-12-28 ENCOUNTER — Encounter: Payer: Self-pay | Admitting: Neurology

## 2017-12-28 ENCOUNTER — Ambulatory Visit (INDEPENDENT_AMBULATORY_CARE_PROVIDER_SITE_OTHER): Payer: Medicare HMO | Admitting: Neurology

## 2017-12-28 VITALS — BP 127/74 | HR 65 | Ht 63.0 in | Wt 160.0 lb

## 2017-12-28 DIAGNOSIS — R55 Syncope and collapse: Secondary | ICD-10-CM | POA: Diagnosis not present

## 2017-12-28 NOTE — Progress Notes (Signed)
PATIENT: Teresa Riley DOB: 1934/11/10  REASON FOR VISIT: follow up HISTORY FROM: patient  HISTORY OF PRESENT ILLNESS:  HISTORY She was referred by her primary care physician Dr. 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 MRI scan of the brain in 2014, shows extensive changes of chroniic microvascular ischemia and moderate degree of generalized cerebral atrophy and ventricular enlargement which appear slightly more advanced compared with previous MRI dated 06/03/2006. She began to have episodes of seizure-like event since 2013, she felt overheated, then become confused, blank stares, then slump down, body jerking movement,   For a while, she continue have recurrent episode on titrating dose of Keppra up to 1500 mg twice a day, Vimpat 100 mg twice a day, EEG in March 2016 showed bifrontal delta slowing, with right hemispheric predominance. This is consistent with FIRDA.  Upon further questioning, all episode happened in a standing position, she was on 4 agents treatment for her hypertension, lisinopril 40 mg in the morning, amlodipine 5 mg in the morning, hydrochlorothiazide 25 mg in the morning, atenolol 300 mg twice a day, all the episodes clustered in the morning time, shortly after she take her blood pressure medications.  I have decreased her blood pressure medication since May 2016, lisinopril 20 mg twice a day, amlodipine 5 mg every night, atenolol 300 mg every morning, stopped hydrochlorothiazide, and potassium supplement,  She no longer has recurrent passing out episode, seizure-like event, doing very well, continue has mild memory trouble,  We  later was able to wean her off Vimpat in 2016, eventually also tapered her off keppra since early 2017,   She continue have intermittent spells after she was taken off ant- epileptic medications, there seems to be no significant difference with high-dose Keppra and Vimpat treatment, versus not taking any.  She does have significant orthostatic blood pressure changes,   She is on polypharmacy for blood pressure treatment, atenolol 300 mg every morning, Norvasc 5 mg every night, lisinopril 20 mg every night  She also complains of bilateral feet paresthesia, mild length dependent sensory changes   UPDATE December 28 2017: She was taken to the emergency room on December 24, 2017, had another spells, she missed her breakfast, had early lunch, on her ridehome, she was talking with her daughter, suddenly was noted she was silent, slumped over, not responsive, paramedic was called, she had no recollection of the event, woke up paramedic was working on her, per daughter, there was first-degree block on the initial EKG  At the emergency room, there was significant orthostatic blood pressure drop   BP- Lying Pulse- Lying BP- Sitting Pulse- Sitting BP- Standing at 0 minutes Pulse- Standing at 0 minutes  12/24/17 1625 140/68 61 132/67 71 118/64 77    REVIEW OF SYSTEMS: Out of a complete 14 system review of symptoms, the patient complains only of the following symptoms, and all other reviewed systems are negative.  See HPI  ALLERGIES: No Known Allergies  HOME MEDICATIONS: Outpatient Medications Prior to Visit  Medication Sig Dispense Refill  . acetaminophen (TYLENOL) 325 MG tablet Take 325-650 mg by mouth 2 (two) times daily as needed (eye pain).    Marland Kitchen albuterol (  PROAIR HFA) 108 (90 Base) MCG/ACT inhaler Inhale 2 puffs into the lungs every 6 (six) hours as needed for wheezing or shortness of breath. 18 g 11  . ALPRAZolam (XANAX) 0.25 MG tablet TAKE 1 TABLET BY MOUTH 2 TIMES DAILY AS NEEDED (Patient  taking differently: Take 0.25 mg by mouth at bedtime. ) 60 tablet 5  . amLODipine (NORVASC) 10 MG tablet Take 1 tablet (10 mg total) by mouth daily. (Patient taking differently: Take 10 mg by mouth at bedtime. ) 90 tablet 3  . aspirin EC 81 MG tablet Take 81 mg by mouth daily.     Marland Kitchen atorvastatin (LIPITOR) 10 MG tablet TAKE 1 TABLET BY MOUTH ONCE DAILY 90 tablet 0  . Calcium-Magnesium-Vitamin D (CALCIUM MAGNESIUM PO) Take 1 tablet by mouth daily with lunch.     . fluocinonide cream (LIDEX) 0.05 % Apply topically 2 (two) times daily. 30 g 1  . fluticasone (FLONASE) 50 MCG/ACT nasal spray instill 1 spray into each nostril once daily (Patient taking differently: instill 1 spray into each nostril daily as needed for seasonal allergies) 16 g 2  . hydrocortisone cream 1 % Apply 1 application topically 2 (two) times daily as needed for itching (eczema/rash).    . labetalol (NORMODYNE) 300 MG tablet TAKE 1 TABLET BY MOUTH TWICE A DAY (Patient taking differently: TAKE 1 TABLET BY MOUTH DAILY AT BEDTIME) 180 tablet 1  . lisinopril (PRINIVIL,ZESTRIL) 20 MG tablet Take 1 tablet (20 mg total) by mouth 2 (two) times daily. 180 tablet 3  . loratadine (CLARITIN) 10 MG tablet Take 10 mg by mouth daily as needed for allergies or rhinitis.     Marland Kitchen meclizine (ANTIVERT) 12.5 MG tablet TAKE 1 TABLET BY MOUTH 3 TIMES DAILY AS NEEDED FOR DIZZINESS 30 tablet 2  . Memantine HCl-Donepezil HCl (NAMZARIC) 28-10 MG CP24 Take 1 capsule by mouth at bedtime. (Patient taking differently: Take 1 capsule by mouth daily. ) 90 capsule 4  . Omega-3 Fatty Acids (OMEGA 3 PO) Take 1 capsule by mouth daily with lunch.     Bertram Gala Glycol-Propyl Glycol (SYSTANE OP) Place 1 drop into both eyes daily as needed (dry eyes).     . PROLIA 60 MG/ML SOSY injection INJECT 60MG  INTO THE SKIN ONCE (Patient taking differently: INJECT 60MG  INTO THE SKIN EVERY 6 MONTHS/ Administer in upper arm, thigh, or abdomen) 1 mL 0  . tamsulosin (FLOMAX) 0.4 MG CAPS  capsule Take 1 capsule (0.4 mg total) by mouth 2 (two) times daily. (Patient taking differently: Take 0.4 mg by mouth daily. ) 180 capsule 3  . hydrocortisone (ANUSOL-HC) 25 MG suppository Place 1 suppository (25 mg total) rectally every 12 (twelve) hours. (Patient not taking: Reported on 12/24/2017) 12 suppository 1   No facility-administered medications prior to visit.     PAST MEDICAL HISTORY: Past Medical History:  Diagnosis Date  . Allergic rhinitis 09/05/2014  . Allergic rhinitis, cause unspecified 10/29/2010  . Chronic constipation 10/29/2010  . CKD (chronic kidney disease) stage 3, GFR 30-59 ml/min (HCC) 04/28/2017  . Eczema 10/29/2010  . Gait difficulty   . H/O: hysterectomy 10/29/2010  . History of cervical cancer 10/29/2010  . HTN (hypertension) 10/29/2010  . Hyperlipidemia   . Hypertension   . Impaired glucose tolerance 08/16/2012  . Loss of appetite   . Memory loss   . Orthostatic hypotension 06/05/2012  . Osteoporosis   . Right hip pain   . Seizures (HCC)   . Vascular dementia   .  Vitamin D deficiency 09/05/2014    PAST SURGICAL HISTORY: Past Surgical History:  Procedure Laterality Date  . ABDOMINAL HYSTERECTOMY  1970  . BREAST BIOPSY  1980's   benign  . CATARACT EXTRACTION W/ INTRAOCULAR LENS  IMPLANT, BILATERAL  01/2011  . EYE SURGERY      FAMILY HISTORY: Family History  Problem Relation Age of Onset  . Hypertension Mother   . Hypertension Father   . Diabetes Father   . High blood pressure Daughter        x2  . Colon cancer Neg Hx   . Colon polyps Neg Hx   . Kidney disease Neg Hx   . Gallbladder disease Neg Hx     SOCIAL HISTORY: Social History   Socioeconomic History  . Marital status: Divorced    Spouse name: Not on file  . Number of children: 3  . Years of education: 2  . Highest education level: Not on file  Occupational History  . Occupation: retired Data processing manager  . Financial resource strain: Not on file  . Food insecurity:     Worry: Not on file    Inability: Not on file  . Transportation needs:    Medical: Not on file    Non-medical: Not on file  Tobacco Use  . Smoking status: Former Smoker    Types: Cigarettes    Last attempt to quit: 07/13/1971    Years since quitting: 46.4  . Smokeless tobacco: Never Used  . Tobacco comment: quit in 1973  Substance and Sexual Activity  . Alcohol use: No    Alcohol/week: 0.0 oz  . Drug use: No  . Sexual activity: Never  Lifestyle  . Physical activity:    Days per week: Not on file    Minutes per session: Not on file  . Stress: Not on file  Relationships  . Social connections:    Talks on phone: Not on file    Gets together: Not on file    Attends religious service: Not on file    Active member of club or organization: Not on file    Attends meetings of clubs or organizations: Not on file    Relationship status: Not on file  . Intimate partner violence:    Fear of current or ex partner: Not on file    Emotionally abused: Not on file    Physically abused: Not on file    Forced sexual activity: Not on file  Other Topics Concern  . Not on file  Social History Narrative   Patient lives at home with two daughters Wadie Lessen) Anda Latina). Patient is retired. Patient has two years college.      PHYSICAL EXAM  Vitals:   12/28/17 1058  BP: 127/74  Pulse: 65  Weight: 160 lb (72.6 kg)  Height: 5\' 3"  (1.6 m)   Body mass index is 28.34 kg/m.  No data found.   MMSE - Mini Mental State Exam 08/15/2017 03/31/2017 09/27/2016  Orientation to time 5 4 4   Orientation to Place 5 5 5   Registration 3 3 3   Attention/ Calculation 0 5 5  Recall 3 2 1   Language- name 2 objects 2 2 2   Language- repeat 1 1 1   Language- follow 3 step command 3 3 3   Language- read & follow direction 1 1 1   Write a sentence 1 1 1   Copy design 0 1 1  Total score 24 28 27     Generalized: Well developed, in  no acute distress   Neurological examination  Mentation: Alert oriented  to time, place, history taking. Follows all commands speech and language fluent Cranial nerve II-XII: Pupils were equal round reactive to light. Extraocular movements were full, visual field were full on confrontational test. Facial sensation and strength were normal. Uvula tongue midline. Head turning and shoulder shrug  were normal and symmetric. Motor: The motor testing reveals 5 over 5 strength of all 4 extremities. Good symmetric motor tone is noted throughout.  Sensory: Sensory testing is intact to soft touch on all 4 extremities. No evidence of extinction is noted.  Coordination: Cerebellar testing reveals good finger-nose-finger and heel-to-shin bilaterally.  Gait and station: Gait is normal. Tandem gait is normal. Romberg is negative. No drift is seen.  Reflexes: Deep tendon reflexes are symmetric and normal bilaterally.   DIAGNOSTIC DATA (LABS, IMAGING, TESTING) - I reviewed patient records, labs, notes, testing and imaging myself where available.  Lab Results  Component Value Date   WBC 4.9 12/24/2017   HGB 12.4 12/24/2017   HCT 39.2 12/24/2017   MCV 99.0 12/24/2017   PLT 203 12/24/2017      Component Value Date/Time   NA 142 12/24/2017 1431   NA 143 09/27/2016 1700   K 3.5 12/24/2017 1431   CL 107 12/24/2017 1431   CO2 27 12/24/2017 1431   GLUCOSE 188 (H) 12/24/2017 1431   BUN 15 12/24/2017 1431   BUN 13 09/27/2016 1700   CREATININE 1.36 (H) 12/24/2017 1431   CALCIUM 9.7 12/24/2017 1431   PROT 7.0 12/24/2017 1431   PROT 7.7 09/27/2016 1700   ALBUMIN 3.4 (L) 12/24/2017 1431   ALBUMIN 4.3 09/27/2016 1700   AST 26 12/24/2017 1431   ALT 16 12/24/2017 1431   ALKPHOS 46 12/24/2017 1431   BILITOT 0.6 12/24/2017 1431   BILITOT 0.4 09/27/2016 1700   GFRNONAA 36 (L) 12/24/2017 1431   GFRAA 41 (L) 12/24/2017 1431   Lab Results  Component Value Date   CHOL 197 09/16/2015   HDL 68.50 09/16/2015   LDLCALC 92 09/05/2014   LDLDIRECT 93.0 09/16/2015   TRIG 244.0 (H)  09/16/2015   CHOLHDL 3 09/16/2015   Lab Results  Component Value Date   HGBA1C 5.5 09/27/2016   Lab Results  Component Value Date   VITAMINB12 1,750 (H) 09/27/2016   Lab Results  Component Value Date   TSH 1.980 09/27/2016      ASSESSMENT AND PLAN 83 y.o. year old female    Passing out seizure-like spells,  Previously she was treated with titrating dose of Keppra 3 g in combination of Vimpat 200 mg without significantly improving her passing out spells,  Most of the episode happened in standing up position, suggestive of hypoperfusion of the brain, there was documented orthostatic blood pressure changes,  Need to rule out cardiac arrhythmia, will refer her to cardiologist  72 hours outpatient video EEG monitoring,  No driving until episodes free for 6 months,  Will hold off and epileptic medication at this point, daughter is to document all events, call clinic for recurrent symptoms,  Levert FeinsteinYijun Brystal Kildow, M.D. Ph.D.  [facility] [address] [place], [place] [address] Phone: [phone] Fax:      [phone]

## 2018-01-13 DIAGNOSIS — R4182 Altered mental status, unspecified: Secondary | ICD-10-CM | POA: Diagnosis not present

## 2018-01-14 DIAGNOSIS — R4182 Altered mental status, unspecified: Secondary | ICD-10-CM | POA: Diagnosis not present

## 2018-01-15 DIAGNOSIS — R4182 Altered mental status, unspecified: Secondary | ICD-10-CM | POA: Diagnosis not present

## 2018-02-01 ENCOUNTER — Telehealth: Payer: Self-pay | Admitting: Neurology

## 2018-02-01 NOTE — Telephone Encounter (Signed)
AMBULATORY ELECTROENCEPHALOGRAM WITH VIDEO     PATIENT NAME: August SaucerWalker, Solei  GENDER: Female DATE OF BIRTH:  [birth year] STUDY NAME: [id number] ORDERED: 72 Hour Ambulatory with Video DURATION: 68 Hours with Video STUDY START DATE/TIME: 7/5 11:59AM STUDY END DATE/TIME: 7/8 8:14AM BILLING DAYS: 3 READING PHYSICIAN: Geraldine SolarAnne Jackson, MD.  REFERRING PHYSICIAN: Levert FeinsteinYijun Kamare Caspers, MD. TECHNOLOGIST: Margreta JourneyJason Jarrett R EEG T. VIDEO: Yes EKG: Yes  AUDIO: Yes   DX Code: R41.82  MEDICATIONS: Xanax Labetalol Amlodipine Tamsulosin    CLINICAL NOTES This is a 72-hour video ambulatory EEG study that was recorded for 68 hours in duration. The study was recorded from July 5-8, 2019 being remotely monitored by a registered technologist to insure integrity of the video and EEG for the entire duration of the recording. If needed the physician was contacted to intervene with the option to diagnose and treat the patient and alter or end the recording. he patient was educated on the procedure prior to starting the study. The patients head was measured and marked using the international 10/20 system, 23 channel digital bipolar EEG connections (over temporal over parasagittal montage).  Additional channels for EOG and EKG.  Recording was continuous and recorded in a bipolar montage that can be re-montaged.  Calibration and impedances were recorded in all channels at 10kohms. The EEG may be flagged at the direction of the patient by the use of a push button. The AEEG was analyzed using the [employer] IEEG spike and seizure analysis. All captured spike and seizures were reviewed by the scanning technologist. A Patient Daily Log" sheet is provided to document patient daily activities as well as "Patient Event Log" sheet for any episodes in question.  HYPERVENTILATION Hyperventilation was not performed for this study.   PHOTIC STIMULATION Photic Stimulation was not performed for this study.   HISTORY The patient is an   83 year old right handed female. The patient reports being treated for seizures for years, around 2014 she started having episodes of passing out about every 6 months or so. She states having LOC for a couple of minutes with no recollection of event and very fatigues afterwards. This study was ordered for evaluation.       SLEEP FEATURES Stages 1, 2, 3, and REM sleep were observed. The patient had a couple of arousals over the night and slept for about 10 hours. Sleep variants like sleep spindles, vertex sharp waves and k-complexes were all noted during sleeping portions of the study.  Day 1 -Sleep 8:29PM; Awake 07:29AM  Day 2 -Sleep 9:42PM; Awake 07:46AM Day 3 -Sleep 9:06PM; Awake 07:31AM    SUMMARY The study was recorded and remotely monitored by a registered technologist for 68 hours to insure integrity of the video and EEG for the entire duration of the recording. The patient returned the Patient Log Sheets. Dominate background rhythm of 14 Hz with an average amplitude of 15 uV, predominately seen in the posterior regions was noted during waking hours. Background was reactive to eye movements, attenuated with opening and repopulated with closure. There were no apparent abnormalities or asymmetries noted by the scanning technologist.  All and any possible abnormalities have been clipped for further review by the physician.   EVENTS The patient's daughter reported there were no patient events. Log was not filled out for activities.   SPIKE/SEIZURE DETECTION Seizure and Spike analysis were performed and reviewed. There were no epileptiform discharges noted throughout the recording. The usual muscle, chewing, eye movement and wire sway artifacts were noted.  EKG EKG was regular with a heart rate of 60 bpm with no arrhythmias noted.   Impressions are by ABRET registered EEG technologist with [facility] and does not signify final interpretation, physician can and may over-ride  these findings upon review. Scanned by Rosita Fire, R.EEG T   PHYSICAN CONCLUSION/IMPRESSION:  This extended video ambulatory EEG is within the broad range of normal. The background activity was noted to be excessive beta (14Hz ) which may be seen when patients are taking benzodiazepines (Xanax) as is the case here. There was no evidence of localized, lateralized or epileptiform features noted.  The patient's daughter reported there were no patient events. Log was not filled out for activities.  Conclusion: A normal electroencephalogram in and of itself, does not rule out the clinical diagnosis of seizure disorder. Clinical correlation recommended.     X_electronically signed 7/17/19_________________________________ Geraldine Solar, MD.               PDR 14Hz / 15uV EKG 60 BPM  Awake    Sleep

## 2018-02-16 ENCOUNTER — Ambulatory Visit: Payer: Medicare HMO | Admitting: Adult Health

## 2018-02-16 ENCOUNTER — Telehealth: Payer: Self-pay | Admitting: Neurology

## 2018-02-16 DIAGNOSIS — R55 Syncope and collapse: Secondary | ICD-10-CM

## 2018-02-16 NOTE — Addendum Note (Signed)
Addended by: Lilla ShookKIRKMAN, Athenia Rys C on: 02/16/2018 04:35 PM   Modules accepted: Orders

## 2018-02-16 NOTE — Telephone Encounter (Signed)
Pt daughter(on DPR-[place],Freda @[phone]) has called and is asking for the results from the devise pt had to wear(daugher referred to it as a box tracking pt's brain activity)

## 2018-02-16 NOTE — Telephone Encounter (Signed)
Spoke to patient's daughter, [name] (on [place]).  Dr. Terrace ArabiaYan has reviewed the 72-hr EEG report and is was normal.  She is recommending a referral to Dr. Hillis RangeJames Allred to have a loop recorder placed.  [name] is in agreement with this plan and the order has been placed in Epic.

## 2018-02-16 NOTE — Telephone Encounter (Signed)
72-hr EEG:  PHYSICAN CONCLUSION/IMPRESSION:  This extended video ambulatory EEG is within the broad range of normal. The background activity was noted to be excessive beta (14Hz ) which may be seen when patients are taking benzodiazepines (Xanax) as is the case here. There was no evidence of localized, lateralized or epileptiform features noted.  The patient's daughter reported there were no patient events. Log was not filled out for activities.  Conclusion: A normal electroencephalogram in and of itself, does not rule out the clinical diagnosis of seizure disorder. Clinical correlation recommended.

## 2018-03-09 DIAGNOSIS — R339 Retention of urine, unspecified: Secondary | ICD-10-CM | POA: Diagnosis not present

## 2018-03-09 DIAGNOSIS — I129 Hypertensive chronic kidney disease with stage 1 through stage 4 chronic kidney disease, or unspecified chronic kidney disease: Secondary | ICD-10-CM | POA: Diagnosis not present

## 2018-03-09 DIAGNOSIS — N183 Chronic kidney disease, stage 3 (moderate): Secondary | ICD-10-CM | POA: Diagnosis not present

## 2018-03-10 DIAGNOSIS — R339 Retention of urine, unspecified: Secondary | ICD-10-CM | POA: Diagnosis not present

## 2018-03-20 ENCOUNTER — Other Ambulatory Visit: Payer: Self-pay | Admitting: Internal Medicine

## 2018-03-21 DIAGNOSIS — R3914 Feeling of incomplete bladder emptying: Secondary | ICD-10-CM | POA: Diagnosis not present

## 2018-03-21 DIAGNOSIS — R3911 Hesitancy of micturition: Secondary | ICD-10-CM | POA: Diagnosis not present

## 2018-03-22 ENCOUNTER — Ambulatory Visit (INDEPENDENT_AMBULATORY_CARE_PROVIDER_SITE_OTHER): Payer: Medicare HMO | Admitting: Internal Medicine

## 2018-03-22 ENCOUNTER — Encounter: Payer: Self-pay | Admitting: Internal Medicine

## 2018-03-22 ENCOUNTER — Telehealth: Payer: Self-pay | Admitting: Internal Medicine

## 2018-03-22 VITALS — BP 130/66 | HR 67 | Ht 62.5 in | Wt 162.2 lb

## 2018-03-22 DIAGNOSIS — R55 Syncope and collapse: Secondary | ICD-10-CM

## 2018-03-22 NOTE — Progress Notes (Signed)
HPI [name] is referred today by Dr. Terrace Arabia for evaluation of altered mental status. She is a pleasant 83 yo woman with HTN. She has had multple episodes of unexplained altered consciousness and neuro workup appears to be unrevealing. She was emperically placed on Keppra and still had episodes. She has passed out when she is sitting our standing. She has noted in the past if she lies down the spells will sometimes pass. At other times she does not have any warning. She has loss of bladder continence but no tongue biting. She has never injured herself. She is typically unresponsive for a few minutes. Her daughter who has witnessed these episodes notes that she will be clammy and weak. She does not stop breathing.  No Known Allergies   Current Outpatient Medications  Medication Sig Dispense Refill  . acetaminophen (TYLENOL) 325 MG tablet Take 325-650 mg by mouth 2 (two) times daily as needed (eye pain).    Marland Kitchen albuterol (PROAIR HFA) 108 (90 Base) MCG/ACT inhaler Inhale 2 puffs into the lungs every 6 (six) hours as needed for wheezing or shortness of breath. 18 g 11  . ALPRAZolam (XANAX) 0.5 MG tablet Take 0.5 mg by mouth at bedtime as needed for anxiety.    Marland Kitchen amLODipine (NORVASC) 10 MG tablet Take 1 tablet (10 mg total) by mouth daily. -- Office visit needed for further refills 90 tablet 0  . aspirin EC 81 MG tablet Take 81 mg by mouth daily.     Marland Kitchen atorvastatin (LIPITOR) 10 MG tablet TAKE 1 TABLET BY MOUTH ONCE DAILY 90 tablet 0  . Calcium-Magnesium-Vitamin D (CALCIUM MAGNESIUM PO) Take 1 tablet by mouth daily with lunch.     . denosumab (PROLIA) 60 MG/ML SOSY injection Inject 60 mg into the skin every 6 (six) months.    . fluocinonide cream (LIDEX) 0.05 % Apply topically 2 (two) times daily. 30 g 1  . fluticasone (FLONASE) 50 MCG/ACT nasal spray Place 1 spray into both nostrils daily.    . hydrocortisone cream 1 % Apply 1 application topically 2 (two) times daily as needed for itching  (eczema/rash).    . labetalol (NORMODYNE) 300 MG tablet Take 1 tablet (300 mg total) by mouth 2 (two) times daily. -- Office visit needed for further refills 180 tablet 0  . lisinopril (PRINIVIL,ZESTRIL) 20 MG tablet Take 1 tablet (20 mg total) by mouth 2 (two) times daily. 180 tablet 3  . loratadine (CLARITIN) 10 MG tablet Take 10 mg by mouth daily as needed for allergies or rhinitis.     . Memantine HCl-Donepezil HCl (NAMZARIC) 28-10 MG CP24 Take 1 capsule by mouth at bedtime. (Patient taking differently: Take 1 capsule by mouth daily. ) 90 capsule 4  . Omega-3 Fatty Acids (OMEGA 3 PO) Take 1 capsule by mouth daily with lunch.     Bertram Gala Glycol-Propyl Glycol (SYSTANE OP) Place 1 drop into both eyes daily as needed (dry eyes).     . tamsulosin (FLOMAX) 0.4 MG CAPS capsule Take 1 capsule (0.4 mg total) by mouth 2 (two) times daily. (Patient taking differently: Take 0.4 mg by mouth daily. ) 180 capsule 3   No current facility-administered medications for this visit.      Past Medical History:  Diagnosis Date  . Allergic rhinitis 09/05/2014  . Allergic rhinitis, cause unspecified 10/29/2010  . Chronic constipation 10/29/2010  . CKD (chronic kidney disease) stage 3, GFR 30-59 ml/min (HCC) 04/28/2017  . Eczema 10/29/2010  .  Gait difficulty   . H/O: hysterectomy 10/29/2010  . History of cervical cancer 10/29/2010  . HTN (hypertension) 10/29/2010  . Hyperlipidemia   . Hypertension   . Impaired glucose tolerance 08/16/2012  . Loss of appetite   . Memory loss   . Orthostatic hypotension 06/05/2012  . Osteoporosis   . Right hip pain   . Seizures (HCC)   . Vascular dementia   . Vitamin D deficiency 09/05/2014    ROS:   All systems reviewed and negative except as noted in the HPI.   Past Surgical History:  Procedure Laterality Date  . ABDOMINAL HYSTERECTOMY  1970  . BREAST BIOPSY  1980's   benign  . CATARACT EXTRACTION W/ INTRAOCULAR LENS  IMPLANT, BILATERAL  01/2011  . EYE SURGERY         Family History  Problem Relation Age of Onset  . Hypertension Mother   . Hypertension Father   . Diabetes Father   . High blood pressure Daughter        x2  . Colon cancer Neg Hx   . Colon polyps Neg Hx   . Kidney disease Neg Hx   . Gallbladder disease Neg Hx      Social History   Socioeconomic History  . Marital status: Divorced    Spouse name: Not on file  . Number of children: 3  . Years of education: 2  . Highest education level: Not on file  Occupational History  . Occupation: retired Data processing manager  . Financial resource strain: Not on file  . Food insecurity:    Worry: Not on file    Inability: Not on file  . Transportation needs:    Medical: Not on file    Non-medical: Not on file  Tobacco Use  . Smoking status: Former Smoker    Types: Cigarettes    Last attempt to quit: 07/13/1971    Years since quitting: 46.7  . Smokeless tobacco: Never Used  . Tobacco comment: quit in 1973  Substance and Sexual Activity  . Alcohol use: No    Alcohol/week: 0.0 standard drinks  . Drug use: No  . Sexual activity: Never  Lifestyle  . Physical activity:    Days per week: Not on file    Minutes per session: Not on file  . Stress: Not on file  Relationships  . Social connections:    Talks on phone: Not on file    Gets together: Not on file    Attends religious service: Not on file    Active member of club or organization: Not on file    Attends meetings of clubs or organizations: Not on file    Relationship status: Not on file  . Intimate partner violence:    Fear of current or ex partner: Not on file    Emotionally abused: Not on file    Physically abused: Not on file    Forced sexual activity: Not on file  Other Topics Concern  . Not on file  Social History Narrative   Patient lives at home with two daughters Wadie Lessen) Anda Latina). Patient is retired. Patient has two years college.     BP 130/66   Pulse 67   Ht 5' 2.5" (1.588 m)   Wt  162 lb 3.2 oz (73.6 kg)   SpO2 97%   BMI 29.19 kg/m   Physical Exam:  Well appearing 83 yo woman, NAD HEENT: Unremarkable Neck:  6 cm JVD, no thyromegally Lymphatics:  No adenopathy Back:  No CVA tenderness Lungs:  Clear with no wheezes HEART:  Regular rate rhythm, no murmurs, no rubs, no clicks Abd:  soft, positive bowel sounds, no organomegally, no rebound, no guarding Ext:  2 plus pulses, no edema, no cyanosis, no clubbing Skin:  No rashes no nodules Neuro:  CN II through XII intact, motor grossly intact  EKG - reviewed. NSR with no pre-excitation. Poor R wave progression.   Assess/Plan: 1. Unexplained syncope - the most likely explanation for her spells is due to autonomic dysfunction. I have reviewed the etiology with the patient and her daughter. Adequate hydration, avoidance of caffeine and ETOH, regular daily exercise and salt and fluid intake were discussed. Because her blood pressure tends to run high, we are limited in medical therapies like florinef or midodrine.  2. HTN - her blood pressure is fairly well controlled. We will follow.  Leonia Reeves.D.

## 2018-03-22 NOTE — Patient Instructions (Addendum)
Medication Instructions:  Your physician recommends that you continue on your current medications as directed. Please refer to the Current Medication list given to you today.  Increase fluids  Decrease caffeine intake  Do not miss any meals  Labwork: None ordered.  Testing/Procedures: None ordered.  Follow-Up: Your physician wants you to follow-up in: 4 months with Dr. Ladona Ridgel.     Any Other Special Instructions Will Be Listed Below (If Applicable).  If you need a refill on your cardiac medications before your next appointment, please call your pharmacy.

## 2018-03-22 NOTE — Telephone Encounter (Signed)
Insurance has been submitted and verified for Prolia. If we send this to the pharmacy, she is able to use her Medicaid benefits and it would not cost the patient anything. The patient would need to pick the injection up and bring it to the office at the time of the visit. Left message informing patient and asked patient to call back to let us know what she would like to do and to schedule.   Due on or after 10/30/52019. Left message for patient to call back to schedule.  Okay to schedule... Visit Note: Prolia ($0 copay - okay to give per Colon Branch) Visit Type: Nurse Provider: Nurse

## 2018-04-23 ENCOUNTER — Other Ambulatory Visit: Payer: Self-pay | Admitting: Internal Medicine

## 2018-05-17 ENCOUNTER — Other Ambulatory Visit: Payer: Self-pay | Admitting: Internal Medicine

## 2018-05-17 NOTE — Telephone Encounter (Signed)
Done erx 

## 2018-05-19 ENCOUNTER — Telehealth: Payer: Self-pay | Admitting: Internal Medicine

## 2018-05-19 MED ORDER — ALPRAZOLAM 0.25 MG PO TABS: ORAL_TABLET | ORAL | 2 refills | Status: DC

## 2018-05-19 NOTE — Telephone Encounter (Signed)
Copied from CRM [id number]. Topic: Quick Communication - See Telephone Encounter >> May 19, 2018  9:31 AM Windy Kalata, NT wrote: CRM for notification. See Telephone encounter for: 05/19/18.  [facility] pharmacy is calling and would like to get a verbal approval to fill ALPRAZolam (XANAX) 0.25 MG tablet as a early refill. I informed her the script was sent over on 05/17/18 and she said it is showing her last refill was 10/14 30 day supply. She states the patient is waiting at the pharmacy and would like a call back as soon as possible.   Cb# [phone]

## 2018-05-19 NOTE — Telephone Encounter (Signed)
Done erx 

## 2018-05-19 NOTE — Telephone Encounter (Signed)
Verbal ok given to [facility]

## 2018-06-07 ENCOUNTER — Telehealth: Payer: Self-pay | Admitting: Internal Medicine

## 2018-06-07 NOTE — Telephone Encounter (Signed)
Copied from CRM [id number]. Topic: Appointment Scheduling - Scheduling Inquiry for Clinic >> Jun 07, 2018 12:04 PM [name], Brittany L wrote: Reason for CRM: Patient's daughter, Teresa Riley said that Teresa Riley called her last week to schedule her proila. She also is asking about getting her ALPRAZolam (XANAX) 0.25 MG tablet increased. She would like Teresa Riley to give her a call back

## 2018-06-07 NOTE — Telephone Encounter (Signed)
We should hold on increased dosing xanax more than 0.25 bid, as this can lead to dependence, confusion and falls in patients > 80

## 2018-06-07 NOTE — Telephone Encounter (Signed)
Routing to dr john---I am working on prolia for this patient---but patient's daughter is also asking if xanax can be increased---she's still having trouble sleeping and still a little too anxious about everything---I advised daughter, dr [name] may require another office visit---wellness visit was in may/2019---routing to dr [name], do you need another office visit to increase xanax dosage---please advise, I will call patient's daughter back, thanks

## 2018-06-12 ENCOUNTER — Telehealth: Payer: Self-pay

## 2018-06-12 MED ORDER — QUETIAPINE FUMARATE 50 MG PO TABS: 50.0000 mg | ORAL_TABLET | Freq: Every day | ORAL | 1 refills | Status: DC

## 2018-06-12 NOTE — Telephone Encounter (Signed)
Ok for seroquel 50 qhs  - done erx

## 2018-06-12 NOTE — Telephone Encounter (Signed)
Patient's daughter will call back to let Kachina Niederer know what medication she thinks her mother is taking, was prescribed by another physician, and we need to add medication to patient's chart---can talk with Sotero Brinkmeyer,RN if needed, or can send Khalil Belote a message to advise

## 2018-06-12 NOTE — Addendum Note (Signed)
Addended by: Corwin LevinsJOHN, JAMES W on: 06/12/2018 05:48 PM   Modules accepted: Orders

## 2018-06-12 NOTE — Telephone Encounter (Signed)
Patient is due another prolia injection--ok to schedule at patient's earliest convenience on nurse schedule---pt daughter to verify that [employer] approval is still ok to use, if yes, she can schedule nurse visit with estimated $0 copay---if [employer] grant has already expired, we will need to wait until after jan 1,2020, to get approval again--can talk with Marlise Fahr,RN if needed

## 2018-06-12 NOTE — Telephone Encounter (Signed)
Patient's daughter has checked at home---patient was prescribed trazadone in 2018 and still has almost full bottle of that she could use----routing to dr [name], would you be ok if patient adds trazadone to help with sleep along with current dosage of xanax she is taking---please advise, I will call patients daughter back

## 2018-06-12 NOTE — Telephone Encounter (Signed)
Routing to dr [name], can you recommend or send in something else to take along with xanax to help with anxiety/especially when trying to get to sleep---pt also needs a 4 day supply of xanax until she can refill original prescription---pt ended up taking 2 tab xanax for a couple of days and is now out---please advise, I will call patient back,thanks

## 2018-06-13 ENCOUNTER — Telehealth: Payer: Self-pay

## 2018-06-13 ENCOUNTER — Telehealth: Payer: Self-pay | Admitting: Internal Medicine

## 2018-06-13 NOTE — Telephone Encounter (Signed)
Dr [name] has given verbal order OK to refill xanax rx 2 days early, meaning rx can be filled today 12/3 instead of 12/5---I have called [facility] pharm and left message giving ok to refill early---patient's daughter, angela advised

## 2018-06-13 NOTE — Telephone Encounter (Signed)
Advised daughter rx has been sent---call us back if this does not help

## 2018-06-13 NOTE — Telephone Encounter (Signed)
Copied from CRM [id number]. Topic: General - Other >> Jun 13, 2018 12:24 PM Marylen PontoMcneil, Ja-Kwan wrote: Reason for CRM: Pt daughter [name] called in requesting to speak with Delaney Meigsamara. [name] stated the pharmacy will not fill the Rx for ALPRAZolam Prudy Feeler(XANAX) 0.25 MG tablet until 06/15/18 but it is more convenient for her to pick up the Rx today. [name] requested that Delaney Meigsamara to return her call. Cb# [phone]

## 2018-06-14 ENCOUNTER — Ambulatory Visit (INDEPENDENT_AMBULATORY_CARE_PROVIDER_SITE_OTHER): Payer: Medicare HMO | Admitting: *Deleted

## 2018-06-14 ENCOUNTER — Telehealth: Payer: Self-pay | Admitting: Neurology

## 2018-06-14 DIAGNOSIS — M810 Age-related osteoporosis without current pathological fracture: Secondary | ICD-10-CM | POA: Diagnosis not present

## 2018-06-14 MED ORDER — DENOSUMAB 60 MG/ML ~~LOC~~ SOSY
60.0000 mg | PREFILLED_SYRINGE | Freq: Once | SUBCUTANEOUS | Status: AC
  Administered 2018-06-14: 60 mg via SUBCUTANEOUS

## 2018-06-14 NOTE — Telephone Encounter (Signed)
Pts daughter [name]([place] requesting a call. stating last night 12/3 the pt had experienced hallucinations throughout the night, also stating the pt had fell several times. Would like to discuss further with RN and discuss if there is anyway the pt can be seen sooner than current appt of 12/19

## 2018-06-14 NOTE — Telephone Encounter (Signed)
Pts daughter [name]( [place]) wanting to inform Dr. Terrace ArabiaYan that the pt is currently in control of taking her own medications, and would like RN to discuss this further with [name]- stating the daughters should be in control of pts medications. [name] requesting a call once this has been discussed, would not like [name] to know she called if possible.

## 2018-06-14 NOTE — Telephone Encounter (Addendum)
Both daughters are on the patient's DPR.  Returned call to [name] to schedule an early follow up for her mother.  She has been placed on Megan's schedule for 06/15/18.  They were informed to arrive at 9am for check-in.  Returned call to [place].  Her mother lives with her sister, [name].  Since her earlier message, the two sisters have now agreed that it is best for the patient not to be responsible for managing her own medications.  Both sisters will be at the appointment tomorrow.  States their mother's memory has continued on a slow, gradual decline.  They are more concerned about her new onset of hallucinations.  It occurred on the night of 06/13/18.  One time, she thought she saw her grandson and followed him out of her bedroom.  Later that night, she thought she saw her daughters having an argument.  Next, [name] found her sitting at the kitchen table at 4am eating applesauce.  They are concerned for her safety.

## 2018-06-15 ENCOUNTER — Ambulatory Visit: Payer: Self-pay | Admitting: Adult Health

## 2018-06-15 ENCOUNTER — Telehealth: Payer: Self-pay | Admitting: *Deleted

## 2018-06-15 NOTE — Telephone Encounter (Addendum)
I called her daughter [name], patient woke up in the middle of night, confused, lack of sleep, fell.  She was started on Seroquel 50mg  qhs by Dr. [name] on Dec 2nd, has not start taking it yet, was taking xanax 0.25mg  bid prn.  Advised by Dr. Terrace ArabiaYan:  1. Seroquel 50mg  every night,  2. Document blood pressure. 3. Call her for appointment in one month  Levert FeinsteinYijun Yan entry on nurse Lindell SparMichelle Shalaunda Weatherholtz login due to computer problem.

## 2018-06-18 ENCOUNTER — Other Ambulatory Visit: Payer: Self-pay | Admitting: Neurology

## 2018-06-18 ENCOUNTER — Other Ambulatory Visit: Payer: Self-pay | Admitting: Internal Medicine

## 2018-06-19 ENCOUNTER — Other Ambulatory Visit: Payer: Self-pay

## 2018-06-19 MED ORDER — MEMANTINE HCL-DONEPEZIL HCL 28-10 MG PO CP24
1.0000 | ORAL_CAPSULE | Freq: Every day | ORAL | 1 refills | Status: DC
Start: 2018-06-19 — End: 2018-12-11

## 2018-06-20 ENCOUNTER — Other Ambulatory Visit: Payer: Self-pay

## 2018-06-20 MED ORDER — ATORVASTATIN CALCIUM 10 MG PO TABS: 10.0000 mg | ORAL_TABLET | Freq: Every day | ORAL | 1 refills | Status: DC

## 2018-06-20 MED ORDER — LABETALOL HCL 300 MG PO TABS: 300.0000 mg | ORAL_TABLET | Freq: Two times a day (BID) | ORAL | 1 refills | Status: DC

## 2018-06-20 MED ORDER — TAMSULOSIN HCL 0.4 MG PO CAPS: 0.4000 mg | ORAL_CAPSULE | Freq: Two times a day (BID) | ORAL | 1 refills | Status: DC

## 2018-06-20 MED ORDER — LISINOPRIL 20 MG PO TABS: 20.0000 mg | ORAL_TABLET | Freq: Two times a day (BID) | ORAL | 1 refills | Status: DC

## 2018-06-27 ENCOUNTER — Ambulatory Visit: Payer: Medicare HMO | Admitting: Adult Health

## 2018-06-29 ENCOUNTER — Ambulatory Visit: Payer: Medicare HMO | Admitting: Neurology

## 2018-06-30 ENCOUNTER — Telehealth: Payer: Self-pay | Admitting: *Deleted

## 2018-06-30 NOTE — Telephone Encounter (Signed)
Per [facility] need to move pt to AM appt. I tried to reach the pt though no answer or vm.

## 2018-07-11 ENCOUNTER — Encounter: Payer: Self-pay | Admitting: Internal Medicine

## 2018-07-13 ENCOUNTER — Ambulatory Visit (INDEPENDENT_AMBULATORY_CARE_PROVIDER_SITE_OTHER): Payer: Medicare HMO | Admitting: Internal Medicine

## 2018-07-13 ENCOUNTER — Encounter: Payer: Self-pay | Admitting: Internal Medicine

## 2018-07-13 ENCOUNTER — Other Ambulatory Visit (INDEPENDENT_AMBULATORY_CARE_PROVIDER_SITE_OTHER): Payer: Medicare HMO

## 2018-07-13 VITALS — BP 136/88 | HR 77 | Temp 97.9°F | Ht 64.0 in | Wt 164.0 lb

## 2018-07-13 VITALS — BP 152/82 | HR 71 | Ht 63.0 in | Wt 164.4 lb

## 2018-07-13 DIAGNOSIS — Z0000 Encounter for general adult medical examination without abnormal findings: Secondary | ICD-10-CM | POA: Diagnosis not present

## 2018-07-13 DIAGNOSIS — I10 Essential (primary) hypertension: Secondary | ICD-10-CM

## 2018-07-13 DIAGNOSIS — N183 Chronic kidney disease, stage 3 unspecified: Secondary | ICD-10-CM

## 2018-07-13 DIAGNOSIS — F418 Other specified anxiety disorders: Secondary | ICD-10-CM | POA: Diagnosis not present

## 2018-07-13 DIAGNOSIS — G4700 Insomnia, unspecified: Secondary | ICD-10-CM

## 2018-07-13 DIAGNOSIS — R55 Syncope and collapse: Secondary | ICD-10-CM | POA: Diagnosis not present

## 2018-07-13 DIAGNOSIS — F0390 Unspecified dementia without behavioral disturbance: Secondary | ICD-10-CM | POA: Diagnosis not present

## 2018-07-13 LAB — CBC WITH DIFFERENTIAL/PLATELET
Basophils Absolute: 0.1 10*3/uL (ref 0.0–0.1)
Basophils Relative: 1.9 % (ref 0.0–3.0)
Eosinophils Absolute: 0.2 10*3/uL (ref 0.0–0.7)
Eosinophils Relative: 3.7 % (ref 0.0–5.0)
HCT: 38.1 % (ref 36.0–46.0)
Hemoglobin: 12.6 g/dL (ref 12.0–15.0)
Lymphocytes Relative: 43.4 % (ref 12.0–46.0)
Lymphs Abs: 2.2 10*3/uL (ref 0.7–4.0)
MCHC: 33.2 g/dL (ref 30.0–36.0)
MCV: 97.4 fl (ref 78.0–100.0)
Monocytes Absolute: 0.5 10*3/uL (ref 0.1–1.0)
Monocytes Relative: 9.2 % (ref 3.0–12.0)
Neutro Abs: 2.1 10*3/uL (ref 1.4–7.7)
Neutrophils Relative %: 41.8 % — ABNORMAL LOW (ref 43.0–77.0)
Platelets: 234 10*3/uL (ref 150.0–400.0)
RBC: 3.91 Mil/uL (ref 3.87–5.11)
RDW: 14.3 % (ref 11.5–15.5)
WBC: 5 10*3/uL (ref 4.0–10.5)

## 2018-07-13 LAB — LIPID PANEL
CHOL/HDL RATIO: 3
Cholesterol: 201 mg/dL — ABNORMAL HIGH (ref 0–200)
HDL: 76.2 mg/dL (ref >39.00)
LDL CALC: 102 mg/dL — AB (ref 0–99)
NonHDL: 124.75
TRIGLYCERIDES: 115 mg/dL (ref 0.0–149.0)
VLDL: 23 mg/dL (ref 0.0–40.0)

## 2018-07-13 LAB — HEPATIC FUNCTION PANEL
ALBUMIN: 4 g/dL (ref 3.5–5.2)
ALT: 17 U/L (ref 0–35)
AST: 24 U/L (ref 0–37)
Alkaline Phosphatase: 98 U/L (ref 39–117)
Bilirubin, Direct: 0.1 mg/dL (ref 0.0–0.3)
Total Bilirubin: 0.6 mg/dL (ref 0.2–1.2)
Total Protein: 7.5 g/dL (ref 6.0–8.3)

## 2018-07-13 LAB — BASIC METABOLIC PANEL
BUN: 19 mg/dL (ref 6–23)
CHLORIDE: 109 meq/L (ref 96–112)
CO2: 27 meq/L (ref 19–32)
Calcium: 9.3 mg/dL (ref 8.4–10.5)
Creatinine, Ser: 0.98 mg/dL (ref 0.40–1.20)
GFR: 69.96 mL/min (ref >60.00)
Glucose, Bld: 118 mg/dL — ABNORMAL HIGH (ref 70–99)
POTASSIUM: 4 meq/L (ref 3.5–5.1)
Sodium: 143 mEq/L (ref 135–145)

## 2018-07-13 LAB — TSH: TSH: 0.75 u[IU]/mL (ref 0.35–4.50)

## 2018-07-13 MED ORDER — CANE MISC: 0 refills | Status: DC

## 2018-07-13 MED ORDER — CITALOPRAM HYDROBROMIDE 10 MG PO TABS: 10.0000 mg | ORAL_TABLET | Freq: Every day | ORAL | 3 refills | Status: DC

## 2018-07-13 NOTE — Patient Instructions (Addendum)
Medication Instructions:  Your physician recommends that you continue on your current medications as directed. Please refer to the Current Medication list given to you today.  Labwork: None ordered.  Testing/Procedures: None ordered.  Follow-Up: Your physician wants you to follow-up in: as needed with Dr. Taylor.      Any Other Special Instructions Will Be Listed Below (If Applicable).  If you need a refill on your cardiac medications before your next appointment, please call your pharmacy.   

## 2018-07-13 NOTE — Progress Notes (Signed)
Subjective:    Patient ID: Teresa Riley, female    DOB: [birth year], 84 y.o.   MRN: [id number]  HPI   Recent out of xanax x 3 days with hallucination by the third day.  Needs new cane Past Medical History:  Diagnosis Date  . Allergic rhinitis 09/05/2014  . Allergic rhinitis, cause unspecified 10/29/2010  . Chronic constipation 10/29/2010  . CKD (chronic kidney disease) stage 3, GFR 30-59 ml/min (HCC) 04/28/2017  . Eczema 10/29/2010  . Gait difficulty   . H/O: hysterectomy 10/29/2010  . History of cervical cancer 10/29/2010  . HTN (hypertension) 10/29/2010  . Hyperlipidemia   . Hypertension   . Impaired glucose tolerance 08/16/2012  . Loss of appetite   . Memory loss   . Orthostatic hypotension 06/05/2012  . Osteoporosis   . Right hip pain   . Seizures (HCC)   . Vascular dementia (HCC)   . Vitamin D deficiency 09/05/2014   Past Surgical History:  Procedure Laterality Date  . ABDOMINAL HYSTERECTOMY  1970  . BREAST BIOPSY  1980's   benign  . CATARACT EXTRACTION W/ INTRAOCULAR LENS  IMPLANT, BILATERAL  01/2011  . EYE SURGERY      reports that she quit smoking about 47 years ago. Her smoking use included cigarettes. She has never used smokeless tobacco. She reports that she does not drink alcohol or use drugs. family history includes Diabetes in her father; High blood pressure in her daughter; Hypertension in her father and mother. No Known Allergies Current Outpatient Medications on File Prior to Visit  Medication Sig Dispense Refill  . acetaminophen (TYLENOL) 325 MG tablet Take 325-650 mg by mouth 2 (two) times daily as needed (eye pain).    Marland Kitchen. albuterol (PROAIR HFA) 108 (90 Base) MCG/ACT inhaler Inhale 2 puffs into the lungs every 6 (six) hours as needed for wheezing or shortness of breath. 18 g 11  . ALPRAZolam (XANAX) 0.25 MG tablet TAKE 1 TABLET BY MOUTH TWICE DAILY AS NEEDED 60 tablet 2  . amLODipine (NORVASC) 10 MG tablet TAKE 1 TABLET BY MOUTH EVERY DAY 90 tablet 0  .  aspirin EC 81 MG tablet Take 81 mg by mouth daily.     Marland Kitchen. atorvastatin (LIPITOR) 10 MG tablet Take 1 tablet (10 mg total) by mouth daily at 6 PM. Annual appt is overdue must see provider for future refills 30 tablet 0  . atorvastatin (LIPITOR) 10 MG tablet Take 1 tablet (10 mg total) by mouth daily. 90 tablet 1  . Calcium-Magnesium-Vitamin D (CALCIUM MAGNESIUM PO) Take 1 tablet by mouth daily with lunch.     . denosumab (PROLIA) 60 MG/ML SOSY injection Inject 60 mg into the skin every 6 (six) months.    . fluocinonide cream (LIDEX) 0.05 % Apply topically 2 (two) times daily. 30 g 1  . fluticasone (FLONASE) 50 MCG/ACT nasal spray Place 1 spray into both nostrils daily.    . hydrocortisone cream 1 % Apply 1 application topically 2 (two) times daily as needed for itching (eczema/rash).    . labetalol (NORMODYNE) 300 MG tablet Take 1 tablet (300 mg total) by mouth 2 (two) times daily. 180 tablet 1  . lisinopril (PRINIVIL,ZESTRIL) 20 MG tablet Take 1 tablet (20 mg total) by mouth 2 (two) times daily. 180 tablet 1  . loratadine (CLARITIN) 10 MG tablet Take 10 mg by mouth daily as needed for allergies or rhinitis.     . Memantine HCl-Donepezil HCl (NAMZARIC) 28-10 MG CP24 Take 1 capsule  by mouth at bedtime. 90 capsule 1  . Omega-3 Fatty Acids (OMEGA 3 PO) Take 1 capsule by mouth daily with lunch.     Bertram Gala. Polyethyl Glycol-Propyl Glycol (SYSTANE OP) Place 1 drop into both eyes daily as needed (dry eyes).     . QUEtiapine (SEROQUEL) 50 MG tablet Take 1 tablet (50 mg total) by mouth at bedtime. 90 tablet 1  . tamsulosin (FLOMAX) 0.4 MG CAPS capsule Take 1 capsule (0.4 mg total) by mouth daily. Annual appt is overdue must see provider for future refills 30 capsule 0  . tamsulosin (FLOMAX) 0.4 MG CAPS capsule Take 1 capsule (0.4 mg total) by mouth 2 (two) times daily. 180 capsule 1   No current facility-administered medications on file prior to visit.    Review of Systems  Constitutional: Negative for other  unusual diaphoresis or sweats HENT: Negative for ear discharge or swelling Eyes: Negative for other worsening visual disturbances Respiratory: Negative for stridor or other swelling  Gastrointestinal: Negative for worsening distension or other blood Genitourinary: Negative for retention or other urinary change Musculoskeletal: Negative for other MSK pain or swelling Skin: Negative for color change or other new lesions Neurological: Negative for worsening tremors and other numbness  Psychiatric/Behavioral: Negative for worsening agitation or other fatigue All other system neg per pt    Objective:   Physical Exam BP 136/88   Pulse 77   Temp 97.9 F (36.6 C) (Oral)   Ht 5\' 4"  (1.626 m)   Wt 164 lb (74.4 kg)   SpO2 96%   BMI 28.15 kg/m         Assessment & Plan:

## 2018-07-13 NOTE — Progress Notes (Signed)
Subjective:    Patient ID: Teresa Riley, female    DOB: 1935/02/10, 84 y.o.   MRN: [id number]  HPI  Here for wellness and f/u with daughter who gives hx;  Overall doing ok;  Pt daughter denies pt with  Chest pain, worsening SOB, DOE, wheezing, orthopnea, PND, worsening LE edema, palpitations, dizziness or syncope.  Pt denies neurological change such as new headache, facial or extremity weakness.  Pt denies polydipsia, polyuria, or low sugar symptoms. Pt states overall good compliance with treatment and medications, good tolerability, and has been trying to follow appropriate diet.  Pt has mild worsening depressive symptoms, but no suicidal ideation or panic. No fever, night sweats, wt loss, loss of appetite, or other constitutional symptoms.  Pt states fair ability with ADL's, has low to mod fall risk, home safety reviewed and adequate, no other significant changes in hearing or vision, and not active with exercise.  Dementia overall conts with mild worsening, but not assoc with behavioral changes such as hallucinations, paranoia, or agitation.  No other complaints Past Medical History:  Diagnosis Date  . Allergic rhinitis 09/05/2014  . Allergic rhinitis, cause unspecified 10/29/2010  . Chronic constipation 10/29/2010  . CKD (chronic kidney disease) stage 3, GFR 30-59 ml/min (HCC) 04/28/2017  . Eczema 10/29/2010  . Gait difficulty   . H/O: hysterectomy 10/29/2010  . History of cervical cancer 10/29/2010  . HTN (hypertension) 10/29/2010  . Hyperlipidemia   . Hypertension   . Impaired glucose tolerance 08/16/2012  . Loss of appetite   . Memory loss   . Orthostatic hypotension 06/05/2012  . Osteoporosis   . Right hip pain   . Seizures (HCC)   . Vascular dementia (HCC)   . Vitamin D deficiency 09/05/2014   Past Surgical History:  Procedure Laterality Date  . ABDOMINAL HYSTERECTOMY  1970  . BREAST BIOPSY  1980's   benign  . CATARACT EXTRACTION W/ INTRAOCULAR LENS  IMPLANT, BILATERAL  01/2011  .  EYE SURGERY      reports that she quit smoking about 47 years ago. Her smoking use included cigarettes. She has never used smokeless tobacco. She reports that she does not drink alcohol or use drugs. family history includes Diabetes in her father; High blood pressure in her daughter; Hypertension in her father and mother. No Known Allergies Current Outpatient Medications on File Prior to Visit  Medication Sig Dispense Refill  . acetaminophen (TYLENOL) 325 MG tablet Take 325-650 mg by mouth 2 (two) times daily as needed (eye pain).    Marland Kitchen. albuterol (PROAIR HFA) 108 (90 Base) MCG/ACT inhaler Inhale 2 puffs into the lungs every 6 (six) hours as needed for wheezing or shortness of breath. 18 g 11  . ALPRAZolam (XANAX) 0.25 MG tablet TAKE 1 TABLET BY MOUTH TWICE DAILY AS NEEDED 60 tablet 2  . amLODipine (NORVASC) 10 MG tablet TAKE 1 TABLET BY MOUTH EVERY DAY 90 tablet 0  . aspirin EC 81 MG tablet Take 81 mg by mouth daily.     Marland Kitchen. atorvastatin (LIPITOR) 10 MG tablet Take 1 tablet (10 mg total) by mouth daily at 6 PM. Annual appt is overdue must see provider for future refills 30 tablet 0  . atorvastatin (LIPITOR) 10 MG tablet Take 1 tablet (10 mg total) by mouth daily. 90 tablet 1  . Calcium-Magnesium-Vitamin D (CALCIUM MAGNESIUM PO) Take 1 tablet by mouth daily with lunch.     . denosumab (PROLIA) 60 MG/ML SOSY injection Inject 60 mg into the  skin every 6 (six) months.    . fluocinonide cream (LIDEX) 0.05 % Apply topically 2 (two) times daily. 30 g 1  . fluticasone (FLONASE) 50 MCG/ACT nasal spray Place 1 spray into both nostrils daily.    . hydrocortisone cream 1 % Apply 1 application topically 2 (two) times daily as needed for itching (eczema/rash).    . labetalol (NORMODYNE) 300 MG tablet Take 1 tablet (300 mg total) by mouth 2 (two) times daily. 180 tablet 1  . lisinopril (PRINIVIL,ZESTRIL) 20 MG tablet Take 1 tablet (20 mg total) by mouth 2 (two) times daily. 180 tablet 1  . loratadine (CLARITIN)  10 MG tablet Take 10 mg by mouth daily as needed for allergies or rhinitis.     . Memantine HCl-Donepezil HCl (NAMZARIC) 28-10 MG CP24 Take 1 capsule by mouth at bedtime. 90 capsule 1  . Omega-3 Fatty Acids (OMEGA 3 PO) Take 1 capsule by mouth daily with lunch.     Bertram Gala Glycol-Propyl Glycol (SYSTANE OP) Place 1 drop into both eyes daily as needed (dry eyes).     . QUEtiapine (SEROQUEL) 50 MG tablet Take 1 tablet (50 mg total) by mouth at bedtime. 90 tablet 1  . tamsulosin (FLOMAX) 0.4 MG CAPS capsule Take 1 capsule (0.4 mg total) by mouth daily. Annual appt is overdue must see provider for future refills 30 capsule 0  . tamsulosin (FLOMAX) 0.4 MG CAPS capsule Take 1 capsule (0.4 mg total) by mouth 2 (two) times daily. 180 capsule 1   No current facility-administered medications on file prior to visit.    Review of Systems Unable due to dementia, daughter has no other complaints    Objective:   Physical Exam BP 136/88   Pulse 77   Temp 97.9 F (36.6 C) (Oral)   Ht 5\' 4"  (1.626 m)   Wt 164 lb (74.4 kg)   SpO2 96%   BMI 28.15 kg/m  VS noted,  Constitutional: Pt is oriented to person, place, and time. Appears well-developed and well-nourished, in no significant distress and comfortable Head: Normocephalic and atraumatic  Eyes: Conjunctivae and EOM are normal. Pupils are equal, round, and reactive to light Right Ear: External ear normal without discharge Left Ear: External ear normal without discharge Nose: Nose without discharge or deformity Mouth/Throat: Oropharynx is without other ulcerations and moist  Neck: Normal range of motion. Neck supple. No JVD present. No tracheal deviation present or significant neck LA or mass Cardiovascular: Normal rate, regular rhythm, normal heart sounds and intact distal pulses.   Pulmonary/Chest: WOB normal and breath sounds without rales or wheezing  Abdominal: Soft. Bowel sounds are normal. NT. No HSM  Musculoskeletal: Normal range of  motion. Exhibits no edema Lymphadenopathy: Has no other cervical adenopathy.  Neurological: Pt is alert and oriented to person, place, and time. Pt has normal reflexes. No cranial nerve deficit. Motor grossly intact, Gait intact Skin: Skin is warm and dry. No rash noted or new ulcerations Psychiatric:  Has normal mood and affect. Behavior is normal without agitation No other exam findings Lab Results  Component Value Date   WBC 5.0 07/13/2018   HGB 12.6 07/13/2018   HCT 38.1 07/13/2018   PLT 234.0 07/13/2018   GLUCOSE 118 (H) 07/13/2018   CHOL 201 (H) 07/13/2018   TRIG 115.0 07/13/2018   HDL 76.20 07/13/2018   LDLDIRECT 93.0 09/16/2015   LDLCALC 102 (H) 07/13/2018   ALT 17 07/13/2018   AST 24 07/13/2018   NA 143 07/13/2018  K 4.0 07/13/2018   CL 109 07/13/2018   CREATININE 0.98 07/13/2018   BUN 19 07/13/2018   CO2 27 07/13/2018   TSH 0.75 07/13/2018   HGBA1C 5.5 09/27/2016          Assessment & Plan:

## 2018-07-13 NOTE — Progress Notes (Signed)
HPI [name] returns today for followup of syncope. She is a pleasant 84 yo woman who I saw several months ago for evaluation of syncope and despite her advanced age, appeared to have autonomic dysfunction. The standard rec's were given. In the interim she has not passed out. She denies chest pain or sob. Her daughter who is with her noted that she had an episode of altered mentation at night and got confused and fell. She says that she was sleep deprived.  No Known Allergies   Current Outpatient Medications  Medication Sig Dispense Refill  . acetaminophen (TYLENOL) 325 MG tablet Take 325-650 mg by mouth 2 (two) times daily as needed (eye pain).    Marland Kitchen. albuterol (PROAIR HFA) 108 (90 Base) MCG/ACT inhaler Inhale 2 puffs into the lungs every 6 (six) hours as needed for wheezing or shortness of breath. 18 g 11  . ALPRAZolam (XANAX) 0.25 MG tablet TAKE 1 TABLET BY MOUTH TWICE DAILY AS NEEDED 60 tablet 2  . amLODipine (NORVASC) 10 MG tablet TAKE 1 TABLET BY MOUTH EVERY DAY 90 tablet 0  . aspirin EC 81 MG tablet Take 81 mg by mouth daily.     Marland Kitchen. atorvastatin (LIPITOR) 10 MG tablet Take 1 tablet (10 mg total) by mouth daily at 6 PM. Annual appt is overdue must see provider for future refills 30 tablet 0  . atorvastatin (LIPITOR) 10 MG tablet Take 1 tablet (10 mg total) by mouth daily. 90 tablet 1  . Calcium-Magnesium-Vitamin D (CALCIUM MAGNESIUM PO) Take 1 tablet by mouth daily with lunch.     . denosumab (PROLIA) 60 MG/ML SOSY injection Inject 60 mg into the skin every 6 (six) months.    . fluocinonide cream (LIDEX) 0.05 % Apply topically 2 (two) times daily. 30 g 1  . fluticasone (FLONASE) 50 MCG/ACT nasal spray Place 1 spray into both nostrils daily.    . hydrocortisone cream 1 % Apply 1 application topically 2 (two) times daily as needed for itching (eczema/rash).    . labetalol (NORMODYNE) 300 MG tablet Take 1 tablet (300 mg total) by mouth 2 (two) times daily. 180 tablet 1  . lisinopril  (PRINIVIL,ZESTRIL) 20 MG tablet Take 1 tablet (20 mg total) by mouth 2 (two) times daily. 180 tablet 1  . loratadine (CLARITIN) 10 MG tablet Take 10 mg by mouth daily as needed for allergies or rhinitis.     . Memantine HCl-Donepezil HCl (NAMZARIC) 28-10 MG CP24 Take 1 capsule by mouth at bedtime. 90 capsule 1  . Omega-3 Fatty Acids (OMEGA 3 PO) Take 1 capsule by mouth daily with lunch.     Bertram Gala. Polyethyl Glycol-Propyl Glycol (SYSTANE OP) Place 1 drop into both eyes daily as needed (dry eyes).     . QUEtiapine (SEROQUEL) 50 MG tablet Take 1 tablet (50 mg total) by mouth at bedtime. 90 tablet 1  . tamsulosin (FLOMAX) 0.4 MG CAPS capsule Take 1 capsule (0.4 mg total) by mouth daily. Annual appt is overdue must see provider for future refills 30 capsule 0  . tamsulosin (FLOMAX) 0.4 MG CAPS capsule Take 1 capsule (0.4 mg total) by mouth 2 (two) times daily. 180 capsule 1   No current facility-administered medications for this visit.      Past Medical History:  Diagnosis Date  . Allergic rhinitis 09/05/2014  . Allergic rhinitis, cause unspecified 10/29/2010  . Chronic constipation 10/29/2010  . CKD (chronic kidney disease) stage 3, GFR 30-59 ml/min (HCC) 04/28/2017  .  Eczema 10/29/2010  . Gait difficulty   . H/O: hysterectomy 10/29/2010  . History of cervical cancer 10/29/2010  . HTN (hypertension) 10/29/2010  . Hyperlipidemia   . Hypertension   . Impaired glucose tolerance 08/16/2012  . Loss of appetite   . Memory loss   . Orthostatic hypotension 06/05/2012  . Osteoporosis   . Right hip pain   . Seizures (HCC)   . Vascular dementia (HCC)   . Vitamin D deficiency 09/05/2014    ROS:   All systems reviewed and negative except as noted in the HPI.   Past Surgical History:  Procedure Laterality Date  . ABDOMINAL HYSTERECTOMY  1970  . BREAST BIOPSY  1980's   benign  . CATARACT EXTRACTION W/ INTRAOCULAR LENS  IMPLANT, BILATERAL  01/2011  . EYE SURGERY       Family History  Problem  Relation Age of Onset  . Hypertension Mother   . Hypertension Father   . Diabetes Father   . High blood pressure Daughter        x2  . Colon cancer Neg Hx   . Colon polyps Neg Hx   . Kidney disease Neg Hx   . Gallbladder disease Neg Hx      Social History   Socioeconomic History  . Marital status: Divorced    Spouse name: Not on file  . Number of children: 3  . Years of education: 2  . Highest education level: Not on file  Occupational History  . Occupation: retired Data processing manager  . Financial resource strain: Not on file  . Food insecurity:    Worry: Not on file    Inability: Not on file  . Transportation needs:    Medical: Not on file    Non-medical: Not on file  Tobacco Use  . Smoking status: Former Smoker    Types: Cigarettes    Last attempt to quit: 07/13/1971    Years since quitting: 47.0  . Smokeless tobacco: Never Used  . Tobacco comment: quit in 1973  Substance and Sexual Activity  . Alcohol use: No    Alcohol/week: 0.0 standard drinks  . Drug use: No  . Sexual activity: Never  Lifestyle  . Physical activity:    Days per week: Not on file    Minutes per session: Not on file  . Stress: Not on file  Relationships  . Social connections:    Talks on phone: Not on file    Gets together: Not on file    Attends religious service: Not on file    Active member of club or organization: Not on file    Attends meetings of clubs or organizations: Not on file    Relationship status: Not on file  . Intimate partner violence:    Fear of current or ex partner: Not on file    Emotionally abused: Not on file    Physically abused: Not on file    Forced sexual activity: Not on file  Other Topics Concern  . Not on file  Social History Narrative   Patient lives at home with two daughters Teresa Riley) Teresa Riley). Patient is retired. Patient has two years college.     BP (!) 152/82   Pulse 71   Ht 5\' 3"  (1.6 m)   Wt 164 lb 6.4 oz (74.6 kg)   SpO2  99%   BMI 29.12 kg/m   Physical Exam:  Well appearing NAD HEENT: Unremarkable Neck:  No JVD, no thyromegally  Lymphatics:  No adenopathy Back:  No CVA tenderness Lungs:  Clear with no wheezes HEART:  Regular rate rhythm, no murmurs, no rubs, no clicks Abd:  soft, positive bowel sounds, no organomegally, no rebound, no guarding Ext:  2 plus pulses, no edema, no cyanosis, no clubbing Skin:  No rashes no nodules Neuro:  CN II through XII intact, motor grossly intact   Assess/Plan: 1. Autonomic dysfunction - she is doing well. I have again reviewed the physiology and treatment and will see her back as needed. 2. HTN - her blood pressure is again elevated. I would prefer her pressure to be high rather than low to help avoid passing out.  Lewayne BuntingGregg Raechal Raben, M.D.

## 2018-07-13 NOTE — Assessment & Plan Note (Signed)
stable overall by history and exam, recent data reviewed with pt, and pt to continue medical treatment as before,  to f/u any worsening symptoms or concerns  

## 2018-07-13 NOTE — Patient Instructions (Signed)
Please take all new medication as prescribed - the new celexa 10 mg per day  Please continue all other medications as before, and refills have been done if requested.  Please have the pharmacy call with any other refills you may need.  Please continue your efforts at being more active, low cholesterol diet, and weight control.  You are otherwise up to date with prevention measures today.  Please keep your appointments with your specialists as you may have planned  Please go to the LAB in the Basement (turn left off the elevator) for the tests to be done today  You will be contacted by phone if any changes need to be made immediately.  Otherwise, you will receive a letter about your results with an explanation, but please check with MyChart first.  Please remember to sign up for MyChart if you have not done so, as this will be important to you in the future with finding out test results, communicating by private email, and scheduling acute appointments online when needed.  Please return in 6 months, or sooner if needed

## 2018-07-13 NOTE — Assessment & Plan Note (Signed)
Stable, to cont seroquel

## 2018-07-13 NOTE — Assessment & Plan Note (Signed)

## 2018-07-13 NOTE — Assessment & Plan Note (Signed)
Ok to add celexa 10 qd

## 2018-07-14 ENCOUNTER — Other Ambulatory Visit (INDEPENDENT_AMBULATORY_CARE_PROVIDER_SITE_OTHER): Payer: Medicare HMO

## 2018-07-14 DIAGNOSIS — Z0000 Encounter for general adult medical examination without abnormal findings: Secondary | ICD-10-CM | POA: Diagnosis not present

## 2018-07-14 LAB — URINALYSIS, ROUTINE W REFLEX MICROSCOPIC
Bilirubin Urine: NEGATIVE
Hgb urine dipstick: NEGATIVE
Ketones, ur: NEGATIVE
Leukocytes, UA: NEGATIVE
Nitrite: NEGATIVE
RBC / HPF: NONE SEEN (ref 0–?)
Specific Gravity, Urine: 1.015 (ref 1.000–1.030)
TOTAL PROTEIN, URINE-UPE24: NEGATIVE
URINE GLUCOSE: NEGATIVE
Urobilinogen, UA: 0.2 (ref 0.0–1.0)
pH: 7 (ref 5.0–8.0)

## 2018-07-27 ENCOUNTER — Ambulatory Visit: Payer: Self-pay | Admitting: Neurology

## 2018-08-02 NOTE — Telephone Encounter (Signed)
error 

## 2018-08-09 ENCOUNTER — Ambulatory Visit: Payer: Self-pay | Admitting: Neurology

## 2018-08-10 ENCOUNTER — Encounter: Payer: Self-pay | Admitting: Neurology

## 2018-08-10 ENCOUNTER — Ambulatory Visit (INDEPENDENT_AMBULATORY_CARE_PROVIDER_SITE_OTHER): Payer: Medicare HMO | Admitting: Neurology

## 2018-08-10 VITALS — BP 158/85 | HR 69 | Ht 64.0 in | Wt 166.0 lb

## 2018-08-10 DIAGNOSIS — F0390 Unspecified dementia without behavioral disturbance: Secondary | ICD-10-CM

## 2018-08-10 NOTE — Progress Notes (Signed)
PATIENT: Teresa Riley DOB: 1935/01/20  REASON FOR VISIT: follow up HISTORY FROM: patient  HISTORY OF PRESENT ILLNESS:  HISTORY She was referred by her primary care physician Dr. 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 MRI scan of the brain in 2014, shows extensive changes of chroniic microvascular ischemia and moderate degree of generalized cerebral atrophy and ventricular enlargement which appear slightly more advanced compared with previous MRI dated 06/03/2006. She began to have episodes of seizure-like event since 2013, she felt overheated, then become confused, blank stares, then slump down, body jerking movement,   For a while, she continue have recurrent episode on titrating dose of Keppra up to 1500 mg twice a day, Vimpat 100 mg twice a day, EEG in March 2016 showed bifrontal delta slowing, with right hemispheric predominance. This is consistent with FIRDA.  Upon further questioning, all episode happened in a standing position, she was on 4 agents treatment for her hypertension, lisinopril 40 mg in the morning, amlodipine 5 mg in the morning, hydrochlorothiazide 25 mg in the morning, atenolol 300 mg twice a day, all the episodes clustered in the morning time, shortly after she take her blood pressure medications.  I have decreased her blood pressure medication since May 2016, lisinopril 20 mg twice a day, amlodipine 5 mg every night, atenolol 300 mg every morning, stopped hydrochlorothiazide, and potassium supplement,  She no longer has recurrent passing out episode, seizure-like event, doing very well, continue has mild memory trouble,  We  later was able to wean her off Vimpat in 2016, eventually also tapered her off keppra since early 2017,   She continue have intermittent spells after she was taken off ant- epileptic medications, there seems to be no significant difference with high-dose Keppra and Vimpat treatment, versus not taking any.  She does have significant orthostatic blood pressure changes,   She is on polypharmacy for blood pressure treatment, atenolol 300 mg every morning, Norvasc 5 mg every night, lisinopril 20 mg every night  She also complains of bilateral feet paresthesia, mild length dependent sensory changes  UPDATE December 28 2017: She was taken to the emergency room on December 24, 2017, had another spells, she missed her breakfast, had early lunch, on her ridehome, she was talking with her daughter, suddenly was noted she was silent, slumped over, not responsive, paramedic was called, she had no recollection of the event, woke up paramedic was working on her, per daughter, there was first-degree block on the initial EKG  At the emergency room, there was significant orthostatic blood pressure drop   BP- Lying Pulse- Lying BP- Sitting Pulse- Sitting BP- Standing at 0 minutes Pulse- Standing at 0 minutes  12/24/17 1625 140/68 61 132/67 71 118/64 77   Update August 10, 2018: She is accompanied by her daughter today's clinical visit, overall she is doing very well, continue has mild slowing with memory loss, no longer has passing out spells  REVIEW OF SYSTEMS: Out of a complete 14 system review of symptoms, the patient complains only of the following symptoms, hallucinations All rest review of the system were negative  ALLERGIES: No Known Allergies  HOME MEDICATIONS: Outpatient Medications Prior to Visit  Medication Sig Dispense Refill  . acetaminophen (TYLENOL) 325 MG tablet Take 325-650 mg by mouth 2 (two) times daily as needed (eye pain).    Marland Kitchen. albuterol (PROAIR HFA) 108 (90 Base) MCG/ACT inhaler Inhale  2 puffs into the lungs every 6 (six) hours as needed for wheezing or shortness of breath. 18 g 11  . ALPRAZolam (XANAX) 0.25 MG tablet TAKE 1 TABLET BY MOUTH TWICE DAILY AS NEEDED 60 tablet 2  . amLODipine (NORVASC) 10 MG tablet TAKE 1 TABLET BY MOUTH EVERY DAY 90 tablet 0  . aspirin EC 81 MG tablet Take 81 mg by mouth daily.     Marland Kitchen. atorvastatin (LIPITOR) 10 MG tablet Take 1 tablet (10 mg total) by mouth daily at 6 PM. Annual appt is overdue must see provider for future refills 30 tablet 0  . atorvastatin (LIPITOR) 10 MG tablet Take 1 tablet (10 mg total) by mouth daily. 90 tablet 1  . Calcium-Magnesium-Vitamin D (CALCIUM MAGNESIUM PO) Take 1 tablet by mouth daily with lunch.     . citalopram (CELEXA) 10 MG tablet Take 1 tablet (10 mg total) by mouth daily. 90 tablet 3  . denosumab (PROLIA) 60 MG/ML SOSY injection Inject 60 mg into the skin every 6 (six) months.    . fluocinonide cream (LIDEX) 0.05 % Apply topically 2 (two) times daily. 30 g 1  . fluticasone (FLONASE) 50 MCG/ACT nasal spray Place 1 spray into both nostrils daily.    . hydrocortisone cream 1 % Apply 1 application topically 2 (two) times daily as needed for itching (eczema/rash).    . labetalol (NORMODYNE) 300 MG tablet Take 1 tablet (300 mg total) by mouth 2 (two) times daily. 180 tablet 1  . lisinopril (PRINIVIL,ZESTRIL) 20 MG tablet Take 1 tablet (20 mg total) by mouth 2 (two) times daily. 180 tablet 1  . loratadine (CLARITIN) 10 MG tablet Take 10 mg by mouth daily as needed for allergies or rhinitis.     . Memantine HCl-Donepezil HCl (NAMZARIC) 28-10 MG CP24 Take 1 capsule by mouth at bedtime. 90 capsule 1  . Misc. Devices (CANE) MISC Use as directed daily; 1 each 0  . Omega-3 Fatty Acids (OMEGA 3 PO) Take 1 capsule by mouth daily with lunch.     Bertram Gala. Polyethyl Glycol-Propyl Glycol (SYSTANE OP) Place 1 drop into both eyes daily as needed (dry eyes).     . QUEtiapine (SEROQUEL) 50 MG tablet Take 1 tablet (50 mg total) by mouth at  bedtime. 90 tablet 1  . tamsulosin (FLOMAX) 0.4 MG CAPS capsule Take 1 capsule (0.4 mg total) by mouth daily. Annual appt is overdue must see provider for future refills 30 capsule 0  . tamsulosin (FLOMAX) 0.4 MG CAPS capsule Take 1 capsule (0.4 mg total) by mouth 2 (two) times daily. 180 capsule 1   No facility-administered medications prior to visit.     PAST MEDICAL HISTORY: Past Medical History:  Diagnosis Date  . Allergic rhinitis 09/05/2014  . Allergic rhinitis, cause unspecified 10/29/2010  . Chronic constipation 10/29/2010  . CKD (chronic kidney disease) stage 3, GFR 30-59 ml/min (HCC) 04/28/2017  . Eczema 10/29/2010  . Gait difficulty   . H/O: hysterectomy 10/29/2010  . History of cervical cancer 10/29/2010  . HTN (hypertension) 10/29/2010  . Hyperlipidemia   . Hypertension   . Impaired glucose tolerance 08/16/2012  . Loss of appetite   . Memory loss   . Orthostatic hypotension 06/05/2012  . Osteoporosis   . Right hip pain   .  Seizures (HCC)   . Vascular dementia (HCC)   . Vitamin D deficiency 09/05/2014    PAST SURGICAL HISTORY: Past Surgical History:  Procedure Laterality Date  . ABDOMINAL HYSTERECTOMY  1970  . BREAST BIOPSY  1980's   benign  . CATARACT EXTRACTION W/ INTRAOCULAR LENS  IMPLANT, BILATERAL  01/2011  . EYE SURGERY      FAMILY HISTORY: Family History  Problem Relation Age of Onset  . Hypertension Mother   . Hypertension Father   . Diabetes Father   . High blood pressure Daughter        x2  . Colon cancer Neg Hx   . Colon polyps Neg Hx   . Kidney disease Neg Hx   . Gallbladder disease Neg Hx     SOCIAL HISTORY: Social History   Socioeconomic History  . Marital status: Divorced    Spouse name: Not on file  . Number of children: 3  . Years of education: 2  . Highest education level: Not on file  Occupational History  . Occupation: retired Data processing managerteacher  Social Needs  . Financial resource strain: Not on file  . Food insecurity:    Worry: Not on  file    Inability: Not on file  . Transportation needs:    Medical: Not on file    Non-medical: Not on file  Tobacco Use  . Smoking status: Former Smoker    Types: Cigarettes    Last attempt to quit: 07/13/1971    Years since quitting: 47.1  . Smokeless tobacco: Never Used  . Tobacco comment: quit in 1973  Substance and Sexual Activity  . Alcohol use: No    Alcohol/week: 0.0 standard drinks  . Drug use: No  . Sexual activity: Never  Lifestyle  . Physical activity:    Days per week: Not on file    Minutes per session: Not on file  . Stress: Not on file  Relationships  . Social connections:    Talks on phone: Not on file    Gets together: Not on file    Attends religious service: Not on file    Active member of club or organization: Not on file    Attends meetings of clubs or organizations: Not on file    Relationship status: Not on file  . Intimate partner violence:    Fear of current or ex partner: Not on file    Emotionally abused: Not on file    Physically abused: Not on file    Forced sexual activity: Not on file  Other Topics Concern  . Not on file  Social History Narrative   Patient lives at home with two daughters Wadie Lessen(Angela Moore) Anda Latina(Freda Mitchell). Patient is retired. Patient has two years college.      PHYSICAL EXAM  Vitals:   08/10/18 1257  BP: (!) 158/85  Pulse: 69  Weight: 166 lb (75.3 kg)  Height: 5\' 4"  (1.626 m)   Body mass index is 28.49 kg/m.  No data found.   MMSE - Mini Mental State Exam 08/10/2018 08/15/2017 03/31/2017  Orientation to time 3 5 4   Orientation to Place 5 5 5   Registration 3 3 3   Attention/ Calculation 3 0 5  Recall 2 3 2   Language- name 2 objects 2 2 2   Language- repeat 1 1 1   Language- follow 3 step command 3 3 3   Language- read & follow direction 1 1 1   Write a sentence 1 1 1   Copy design 1 0 1  Total score 25 24 28     Generalized: Well developed, in no acute distress   Neurological examination  Mentation: Alert  oriented to time, place, history taking. Follows all commands speech and language fluent Cranial nerve II-XII: Pupils were equal round reactive to light. Extraocular movements were full, visual field were full on confrontational test. Facial sensation and strength were normal. Uvula tongue midline. Head turning and shoulder shrug  were normal and symmetric. Motor: The motor testing reveals 5 over 5 strength of all 4 extremities. Good symmetric motor tone is noted throughout.  Sensory: Sensory testing is intact to soft touch on all 4 extremities. No evidence of extinction is noted.  Coordination: Cerebellar testing reveals good finger-nose-finger and heel-to-shin bilaterally.  Gait and station: Gait is normal. Tandem gait is normal. Romberg is negative. No drift is seen.  Reflexes: Deep tendon reflexes are symmetric and normal bilaterally.   DIAGNOSTIC DATA (LABS, IMAGING, TESTING) - I reviewed patient records, labs, notes, testing and imaging myself where available.  Lab Results  Component Value Date   WBC 5.0 07/13/2018   HGB 12.6 07/13/2018   HCT 38.1 07/13/2018   MCV 97.4 07/13/2018   PLT 234.0 07/13/2018      Component Value Date/Time   NA 143 07/13/2018 1426   NA 143 09/27/2016 1700   K 4.0 07/13/2018 1426   CL 109 07/13/2018 1426   CO2 27 07/13/2018 1426   GLUCOSE 118 (H) 07/13/2018 1426   BUN 19 07/13/2018 1426   BUN 13 09/27/2016 1700   CREATININE 0.98 07/13/2018 1426   CALCIUM 9.3 07/13/2018 1426   PROT 7.5 07/13/2018 1426   PROT 7.7 09/27/2016 1700   ALBUMIN 4.0 07/13/2018 1426   ALBUMIN 4.3 09/27/2016 1700   AST 24 07/13/2018 1426   ALT 17 07/13/2018 1426   ALKPHOS 98 07/13/2018 1426   BILITOT 0.6 07/13/2018 1426   BILITOT 0.4 09/27/2016 1700   GFRNONAA 36 (L) 12/24/2017 1431   GFRAA 41 (L) 12/24/2017 1431   Lab Results  Component Value Date   CHOL 201 (H) 07/13/2018   HDL 76.20 07/13/2018   LDLCALC 102 (H) 07/13/2018   LDLDIRECT 93.0 09/16/2015   TRIG  115.0 07/13/2018   CHOLHDL 3 07/13/2018   Lab Results  Component Value Date   HGBA1C 5.5 09/27/2016   Lab Results  Component Value Date   VITAMINB12 1,750 (H) 09/27/2016   Lab Results  Component Value Date   TSH 0.75 07/13/2018      ASSESSMENT AND PLAN 84 y.o. year old female    Passing out seizure-like spells,  Previously she was treated with titrating dose of Keppra 3 g in combination of Vimpat 200 mg without significantly improving her passing out spells, she has been weaned off all antiepileptic medications  Most of the episode happened in standing up position, suggestive of hypoperfusion of the brain, there was documented orthostatic blood pressure changes,  She was seen by cardiologist Dr. Ladona Ridgel on July 13, 2018, consistent with autonomic dysfunction,  Dementia with behavior issues  Mini-Mental Status Examination 25/30  Doing better with Seroquel 50 mg at bedtime,  Also taking Namzaric 1 tablet at at bedtime    Levert Feinstein, M.D. Ph.D.  [facility] [address] [place], [place] [address] Phone: [phone] Fax:      [phone]

## 2018-09-16 ENCOUNTER — Other Ambulatory Visit: Payer: Self-pay | Admitting: Internal Medicine

## 2018-10-05 ENCOUNTER — Other Ambulatory Visit: Payer: Self-pay | Admitting: Internal Medicine

## 2018-10-05 NOTE — Telephone Encounter (Signed)
Done erx 

## 2018-12-04 ENCOUNTER — Other Ambulatory Visit: Payer: Self-pay | Admitting: Internal Medicine

## 2018-12-06 ENCOUNTER — Telehealth: Payer: Self-pay | Admitting: *Deleted

## 2018-12-06 NOTE — Telephone Encounter (Signed)
I called pt, then angela, daughter.   She stated that pt is doing ok.  Will check with her about keeping appt or making VV.  She will call back and let us know to cancel, Make VV, or r/s  Later.

## 2018-12-08 NOTE — Telephone Encounter (Signed)
Daughter called back and gave the email [email or web address] for the link to be sent. Link sent.  Pt understands that although there may be some limitations with this type of visit, we will take all precautions to reduce any security or privacy concerns.  Pt understands that this will be treated like an in office visit and we will file with pt's insurance, and there may be a patient responsible charge related to this service.

## 2018-12-10 NOTE — Progress Notes (Signed)
Virtual Visit via Video Note  I connected with Nonnie DoneLucy M Knezevic on 12/11/18 at  1:15 PM EDT by a video enabled telemedicine application and verified that I am speaking with the correct person using two identifiers.  Location: Patient: At her home  Provider: In the office   I discussed the limitations of evaluation and management by telemedicine and the availability of in person appointments. The patient expressed understanding and agreed to proceed.  History of Present Illness: HISTORY She was referred by her primary care physician Dr. 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 MRI scan of the brain in 2014, shows extensive changes of chroniic microvascular ischemia and moderate degree of generalized cerebral atrophy and ventricular enlargement which appear slightly more advanced compared with previous MRI dated 06/03/2006. She began to have episodes of seizure-like event since 2013, she felt overheated, then become confused, blank stares, then slump down, body jerking movement,   For a while, she continue have recurrent episode on titrating dose of Keppra up to 1500 mg twice a day, Vimpat 100 mg twice a day, EEG in March 2016 showed bifrontal delta slowing, with right hemispheric predominance. This is consistent with FIRDA.  Upon further questioning, all episode happened in a standing position, she was on 4 agents treatment for her hypertension, lisinopril 40 mg in the morning, amlodipine 5 mg in the morning, hydrochlorothiazide 25 mg in the morning, atenolol 300 mg twice a day, all the episodes clustered in the morning time, shortly after she take her blood  pressure medications.  I have decreased her blood pressure medication since May 2016, lisinopril 20 mg twice a day, amlodipine 5 mg every night, atenolol 300 mg every morning, stopped hydrochlorothiazide, and potassium supplement,  She no longer has recurrent passing out episode, seizure-like event, doing very well, continue has mild memory trouble,  We later was able to wean her off Vimpat in 2016, eventually also tapered her off keppra since early 2017,   She continue have intermittent spells after she was taken off ant- epileptic medications, there seems to be no significant difference with high-dose Keppra and Vimpat treatment, versus not taking any.  She does have significant orthostatic blood pressure changes,   She is on polypharmacy for blood pressure treatment, atenolol 300 mg every morning, Norvasc 5 mg every night, lisinopril 20 mg every night  She also complains of bilateral feet paresthesia, mild length dependent sensory changes  UPDATE December 28 2017: She was taken to the emergency room on December 24, 2017, had another spells, she missed her breakfast, had early lunch, on her ridehome, she was talking with her daughter, suddenly was noted she was silent, slumped over, not responsive, paramedic was called, she had no recollection of the event, woke up paramedic was working on her, per daughter, there was first-degree block on the initial EKG  At the emergency room, there was significant orthostatic blood pressure drop   BP- Lying Pulse- Lying BP- Sitting Pulse- Sitting BP- Standing at 0 minutes Pulse- Standing at 0 minutes  12/24/17 1625 140/68 61 132/67 71 118/64 77   Update August 10, 2018: She is accompanied by her daughter today's clinical visit, overall she  is doing very well, continue has mild slowing with memory loss, no longer has passing out spells  Update December 11, 2018 SS: Ms. Benedicto is an 84 year old female with history of passing out seizure-like spells,  dementia with behavioral issues.  In January 2020 her MMSE was 25/30, taking Seroquel 50 mg at bedtime, Namzaric 1 tablet at bedtime.  She is doing well, denies any changes with her memory.  She has not had any more spells of passing out.  She currently lives with her daughter, is able to manage her own ADLs, do some light cooking.  She does not drive a car.  Her daughter manages her medications.  She has a good appetite, no weight loss. During the day she likes to do some walking, she does use a cane.  Virtual visit, with her daughter present.  Observations/Objective: Alert, answers questions appropriately, follows commands, facial symmetry noted, Moca-blind is 14/22, only able to name 4 words that start with letter "F" in 1 minute, she was fully alert and oriented.  Assessment and Plan: 1.  Passing out seizure-like spells She is no longer on antiepileptic medications, she was on Keppra 3 g in combination with Vimpat 200 mg without significant improvement in her spells.  The spells seem to suggest orthostatic changes, occurring in the standing up position, consistent with autonomic dysfunction after a visit with cardiology in January 2020.  She has not had any recurrent spells.   2.  Dementia with behavioral issues -Her MOCA-Blind was 14/22, slight decline from MMSE in January 2020 was 25/30. No episodes of agitation or behavioral disturbances of recent. -She will continue Seroquel 50 mg at bedtime, Namzaric at bedtime (I sent in a refill) -I have provided a printed prescription for a new cane, will mail to her house -We discussed the importance of regular exercise, healthy eating, plenty of water intake, and brain stimulating exercises to promote memory  Follow Up Instructions: 6 months    I discussed the assessment and treatment plan with the patient. The patient was provided an opportunity to ask questions and all were answered. The patient agreed with the plan and demonstrated an  understanding of the instructions.   The patient was advised to call back or seek an in-person evaluation if the symptoms worsen or if the condition fails to improve as anticipated.  I provided 20 minutes of non-face-to-face time during this encounter.  Otila Kluver, DNP  [facility] [address] [place], [place] [address] [phone]

## 2018-12-11 ENCOUNTER — Other Ambulatory Visit: Payer: Self-pay

## 2018-12-11 ENCOUNTER — Encounter: Payer: Self-pay | Admitting: Neurology

## 2018-12-11 ENCOUNTER — Ambulatory Visit (INDEPENDENT_AMBULATORY_CARE_PROVIDER_SITE_OTHER): Payer: Medicare HMO | Admitting: Neurology

## 2018-12-11 DIAGNOSIS — R55 Syncope and collapse: Secondary | ICD-10-CM | POA: Diagnosis not present

## 2018-12-11 DIAGNOSIS — F0390 Unspecified dementia without behavioral disturbance: Secondary | ICD-10-CM

## 2018-12-11 MED ORDER — CANE MISC: 0 refills | Status: DC

## 2018-12-11 MED ORDER — MEMANTINE HCL-DONEPEZIL HCL ER 28-10 MG PO CP24: 1.0000 | ORAL_CAPSULE | Freq: Every day | ORAL | 1 refills | Status: DC

## 2018-12-11 NOTE — Telephone Encounter (Signed)
Noted  

## 2018-12-11 NOTE — Progress Notes (Signed)
I have reviewed and agreed above plan. 

## 2018-12-12 DIAGNOSIS — H9193 Unspecified hearing loss, bilateral: Secondary | ICD-10-CM | POA: Diagnosis not present

## 2018-12-12 DIAGNOSIS — Z87891 Personal history of nicotine dependence: Secondary | ICD-10-CM | POA: Diagnosis not present

## 2018-12-12 DIAGNOSIS — H6123 Impacted cerumen, bilateral: Secondary | ICD-10-CM | POA: Diagnosis not present

## 2018-12-20 ENCOUNTER — Telehealth: Payer: Self-pay | Admitting: Internal Medicine

## 2018-12-20 ENCOUNTER — Ambulatory Visit: Payer: Medicare HMO

## 2018-12-20 NOTE — Telephone Encounter (Signed)
P[employer]: Patient is responsible for a $250 copay but requires a prior authorization. Patient was scheduled to come in today. I have canceled this appointment and left a message informing the daughter. Will call patient back once medication has been approved.

## 2018-12-22 ENCOUNTER — Telehealth: Payer: Self-pay | Admitting: Internal Medicine

## 2018-12-22 NOTE — Telephone Encounter (Signed)
I have contacted [employer] and provided additional information needed to process PA request, will be getting faxed response soon

## 2018-12-22 NOTE — Telephone Encounter (Signed)
Copied from [place] [id number]. Topic: Quick Communication - See Telephone Encounter >> Dec 22, 2018  9:57 AM Blase Mess A wrote: CRM for notification. See Telephone encounter for: 12/22/18.  Arville Go is [employer] PA is calling regarding denosumab (PROLIA) 60 MG/ML SOSY injection [[id number]] Calling regarding clinical questions Please advise [phone] Ref- [id number] Thank you

## 2018-12-27 ENCOUNTER — Telehealth: Payer: Self-pay | Admitting: Internal Medicine

## 2018-12-27 NOTE — Telephone Encounter (Signed)
Pt's daughter, Levada Dy, has been informed that issue would not be so much for a provider but of a duty they would have to look into them selves. I suggested that she check with "Life Alert" and also check with security systems like [employer] because I'm aware that they do offer services similar with the alert necklaces and home buttons that could be pressed. Levada Dy expressed understanding.

## 2018-12-27 NOTE — Telephone Encounter (Signed)
Pt Daughter called in stated pt is starting to have more frequent falls and she would like to how she would go about getting the alert for around her neck when she feels and she can call for help.   [phone] Levada Dy

## 2019-01-16 ENCOUNTER — Other Ambulatory Visit: Payer: Self-pay

## 2019-01-16 ENCOUNTER — Ambulatory Visit (INDEPENDENT_AMBULATORY_CARE_PROVIDER_SITE_OTHER): Payer: Medicare HMO | Admitting: Internal Medicine

## 2019-01-16 ENCOUNTER — Encounter: Payer: Self-pay | Admitting: Internal Medicine

## 2019-01-16 VITALS — BP 124/78 | HR 74 | Temp 97.9°F | Ht 64.0 in | Wt 171.0 lb

## 2019-01-16 DIAGNOSIS — E559 Vitamin D deficiency, unspecified: Secondary | ICD-10-CM | POA: Diagnosis not present

## 2019-01-16 DIAGNOSIS — E538 Deficiency of other specified B group vitamins: Secondary | ICD-10-CM | POA: Diagnosis not present

## 2019-01-16 DIAGNOSIS — E611 Iron deficiency: Secondary | ICD-10-CM | POA: Diagnosis not present

## 2019-01-16 DIAGNOSIS — Z0000 Encounter for general adult medical examination without abnormal findings: Secondary | ICD-10-CM

## 2019-01-16 DIAGNOSIS — M810 Age-related osteoporosis without current pathological fracture: Secondary | ICD-10-CM

## 2019-01-16 DIAGNOSIS — E785 Hyperlipidemia, unspecified: Secondary | ICD-10-CM

## 2019-01-16 DIAGNOSIS — H6992 Unspecified Eustachian tube disorder, left ear: Secondary | ICD-10-CM | POA: Insufficient documentation

## 2019-01-16 DIAGNOSIS — R739 Hyperglycemia, unspecified: Secondary | ICD-10-CM | POA: Diagnosis not present

## 2019-01-16 DIAGNOSIS — H6982 Other specified disorders of Eustachian tube, left ear: Secondary | ICD-10-CM | POA: Diagnosis not present

## 2019-01-16 DIAGNOSIS — I10 Essential (primary) hypertension: Secondary | ICD-10-CM

## 2019-01-16 LAB — POCT GLYCOSYLATED HEMOGLOBIN (HGB A1C): Hemoglobin A1C: 5.6 % (ref 4.0–5.6)

## 2019-01-16 MED ORDER — GUAIFENESIN ER 600 MG PO TB12: 1200.0000 mg | ORAL_TABLET | Freq: Two times a day (BID) | ORAL | 1 refills | Status: DC | PRN

## 2019-01-16 MED ORDER — DENOSUMAB 60 MG/ML ~~LOC~~ SOSY
60.0000 mg | PREFILLED_SYRINGE | Freq: Once | SUBCUTANEOUS | Status: AC
  Administered 2019-01-16: 60 mg via SUBCUTANEOUS

## 2019-01-16 NOTE — Progress Notes (Signed)
Subjective:    Patient ID: Teresa Riley, female    DOB: 1935-04-10, 84 y.o.   MRN: [id number]  HPI  Here to f/u; overall doing ok,  Pt denies chest pain, increasing sob or doe, wheezing, orthopnea, PND, increased LE swelling, palpitations, dizziness or syncope.  Pt denies new neurological symptoms such as new headache, or facial or extremity weakness or numbness.  Pt denies polydipsia, polyuria, or low sugar episode.  Pt states overall good compliance with meds, mostly trying to follow appropriate diet, Does also have mild left ear fullness with popping and crackling.  Due for prolia today Past Medical History:  Diagnosis Date   Allergic rhinitis 09/05/2014   Allergic rhinitis, cause unspecified 10/29/2010   Chronic constipation 10/29/2010   CKD (chronic kidney disease) stage 3, GFR 30-59 ml/min (HCC) 04/28/2017   Eczema 10/29/2010   Gait difficulty    H/O: hysterectomy 10/29/2010   History of cervical cancer 10/29/2010   HTN (hypertension) 10/29/2010   Hyperlipidemia    Hypertension    Impaired glucose tolerance 08/16/2012   Loss of appetite    Memory loss    Orthostatic hypotension 06/05/2012   Osteoporosis    Right hip pain    Seizures ([place])    Vascular dementia ([place])    Vitamin D deficiency 09/05/2014   Past Surgical History:  Procedure Laterality Date   ABDOMINAL HYSTERECTOMY  1970   BREAST BIOPSY  1980's   benign   CATARACT EXTRACTION W/ INTRAOCULAR LENS  IMPLANT, BILATERAL  01/2011   EYE SURGERY      reports that she quit smoking about 47 years ago. Her smoking use included cigarettes. She has never used smokeless tobacco. She reports that she does not drink alcohol or use drugs. family history includes Diabetes in her father; High blood pressure in her daughter; Hypertension in her father and mother. No Known Allergies Current Outpatient Medications on File Prior to Visit  Medication Sig Dispense Refill   acetaminophen (TYLENOL) 325 MG tablet Take  325-650 mg by mouth 2 (two) times daily as needed (eye pain).     albuterol (PROAIR HFA) 108 (90 Base) MCG/ACT inhaler Inhale 2 puffs into the lungs every 6 (six) hours as needed for wheezing or shortness of breath. 18 g 11   ALPRAZolam (XANAX) 0.25 MG tablet TAKE 1 TABLET BY MOUTH TWICE DAILY AS NEEDED. 60 tablet 2   amLODipine (NORVASC) 10 MG tablet TAKE 1 TABLET BY MOUTH EVERY DAY 90 tablet 1   aspirin EC 81 MG tablet Take 81 mg by mouth daily.      atorvastatin (LIPITOR) 10 MG tablet Take 1 tablet (10 mg total) by mouth daily at 6 PM. Annual appt is overdue must see provider for future refills 30 tablet 0   Calcium-Magnesium-Vitamin D (CALCIUM MAGNESIUM PO) Take 1 tablet by mouth daily with lunch.      citalopram (CELEXA) 10 MG tablet Take 1 tablet (10 mg total) by mouth daily. 90 tablet 3   denosumab (PROLIA) 60 MG/ML SOSY injection Inject 60 mg into the skin every 6 (six) months.     fluocinonide cream (LIDEX) 0.05 % Apply topically 2 (two) times daily. 30 g 1   fluticasone (FLONASE) 50 MCG/ACT nasal spray Place 1 spray into both nostrils daily.     hydrocortisone cream 1 % Apply 1 application topically 2 (two) times daily as needed for itching (eczema/rash).     labetalol (NORMODYNE) 300 MG tablet Take 1 tablet (300 mg total) by mouth  2 (two) times daily. 180 tablet 1   lisinopril (PRINIVIL,ZESTRIL) 20 MG tablet Take 1 tablet (20 mg total) by mouth 2 (two) times daily. 180 tablet 1   loratadine (CLARITIN) 10 MG tablet Take 10 mg by mouth daily as needed for allergies or rhinitis.      Memantine HCl-Donepezil HCl (NAMZARIC) 28-10 MG CP24 Take 1 capsule by mouth at bedtime. 90 capsule 1   Misc. Devices (CANE) MISC Use as directed daily; 1 each 0   Omega-3 Fatty Acids (OMEGA 3 PO) Take 1 capsule by mouth daily with lunch.      Polyethyl Glycol-Propyl Glycol (SYSTANE OP) Place 1 drop into both eyes daily as needed (dry eyes).      QUEtiapine (SEROQUEL) 50 MG tablet TAKE 1  TABLET(50 MG) BY MOUTH AT BEDTIME 90 tablet 1   No current facility-administered medications on file prior to visit.    Review of Systems  Constitutional: Negative for other unusual diaphoresis or sweats HENT: Negative for ear discharge or swelling Eyes: Negative for other worsening visual disturbances Respiratory: Negative for stridor or other swelling  Gastrointestinal: Negative for worsening distension or other blood Genitourinary: Negative for retention or other urinary change Musculoskeletal: Negative for other MSK pain or swelling Skin: Negative for color change or other new lesions Neurological: Negative for worsening tremors and other numbness  Psychiatric/Behavioral: Negative for worsening agitation or other fatigue All other system neg per pt    Objective:   Physical Exam BP 124/78    Pulse 74    Temp 97.9 F (36.6 C) (Oral)    Ht 5\' 4"  (1.626 m)    Wt 171 lb (77.6 kg)    SpO2 96%    BMI 29.35 kg/m  VS noted,  Constitutional: Pt appears in NAD HENT: Head: NCAT.  Right Ear: External ear normal.  Left Ear: External ear normal. , Left TM mild erythema Eyes: . Pupils are equal, round, and reactive to light. Conjunctivae and EOM are normal Nose: without d/c or deformity Neck: Neck supple. Gross normal ROM Cardiovascular: Normal rate and regular rhythm.   Pulmonary/Chest: Effort normal and breath sounds without rales or wheezing.  Abd:  Soft, NT, ND, + BS, no organomegaly Neurological: Pt is alert. At baseline orientation, motor grossly intact Skin: Skin is warm. No rashes, other new lesions, no LE edema Psychiatric: Pt behavior is normal without agitation  No other exam findings  POCT glycosylated hemoglobin (Hb A1C) Order: [id number] Status:  Final result Visible to patient:  No (not released) Dx:  Hyperglycemia  Ref Range & Units 14:09 8762yr ago 6382yr ago  Hemoglobin A1C 4.0 - 5.6 % 5.6  5.5 R, CM  6.0 R, CM             Assessment & Plan:

## 2019-01-16 NOTE — Patient Instructions (Signed)
Please take all new medication as prescribed - the mucinex tabs for the left ear until improved  Your A1c was OK today  You had the Prolia shot today  Please continue all other medications as before, and refills have been done if requested.  Please have the pharmacy call with any other refills you may need.  Please continue your efforts at being more active, low cholesterol diet, and weight control.  Please keep your appointments with your specialists as you may have planned  Please return in 6 months, or sooner if needed, with Lab testing done 3-5 days before

## 2019-01-20 ENCOUNTER — Encounter: Payer: Self-pay | Admitting: Internal Medicine

## 2019-01-20 NOTE — Assessment & Plan Note (Signed)
Mild to mod, for mucinex otc prn,  to f/u any worsening symptoms or concerns 

## 2019-01-20 NOTE — Assessment & Plan Note (Signed)
For prolia today as is due

## 2019-01-20 NOTE — Assessment & Plan Note (Signed)
stable overall by history and exam, recent data reviewed with pt, and pt to continue medical treatment as before,  to f/u any worsening symptoms or concerns  

## 2019-01-20 NOTE — Assessment & Plan Note (Signed)
Also for vit d level with labs, may need further replacement

## 2019-04-09 ENCOUNTER — Other Ambulatory Visit: Payer: Self-pay | Admitting: Internal Medicine

## 2019-04-09 MED ORDER — AMLODIPINE BESYLATE 10 MG PO TABS: 10.0000 mg | ORAL_TABLET | Freq: Every day | ORAL | 1 refills | Status: DC

## 2019-04-09 MED ORDER — LABETALOL HCL 300 MG PO TABS: 300.0000 mg | ORAL_TABLET | Freq: Two times a day (BID) | ORAL | 1 refills | Status: DC

## 2019-04-09 NOTE — Telephone Encounter (Signed)
Patient would like a refill on the following medications and have them sent to her preferred pharmacy [facility] on [address]. Patient is completely out of the medications:   1) amLODipine (NORVASC) 10 MG tablet 2) labetalol (NORMODYNE) 300 MG tablet

## 2019-04-20 ENCOUNTER — Other Ambulatory Visit: Payer: Self-pay

## 2019-04-20 MED ORDER — LISINOPRIL 20 MG PO TABS: 20.0000 mg | ORAL_TABLET | Freq: Two times a day (BID) | ORAL | 1 refills | Status: DC

## 2019-04-26 ENCOUNTER — Encounter: Payer: Self-pay | Admitting: Internal Medicine

## 2019-04-26 ENCOUNTER — Ambulatory Visit (INDEPENDENT_AMBULATORY_CARE_PROVIDER_SITE_OTHER): Payer: Medicare HMO | Admitting: Internal Medicine

## 2019-04-26 ENCOUNTER — Other Ambulatory Visit: Payer: Self-pay

## 2019-04-26 VITALS — BP 134/86 | HR 74 | Temp 97.9°F | Ht 64.0 in | Wt 173.0 lb

## 2019-04-26 DIAGNOSIS — E559 Vitamin D deficiency, unspecified: Secondary | ICD-10-CM

## 2019-04-26 DIAGNOSIS — R739 Hyperglycemia, unspecified: Secondary | ICD-10-CM

## 2019-04-26 DIAGNOSIS — I10 Essential (primary) hypertension: Secondary | ICD-10-CM | POA: Diagnosis not present

## 2019-04-26 DIAGNOSIS — H9193 Unspecified hearing loss, bilateral: Secondary | ICD-10-CM | POA: Diagnosis not present

## 2019-04-26 DIAGNOSIS — Z23 Encounter for immunization: Secondary | ICD-10-CM

## 2019-04-26 DIAGNOSIS — E611 Iron deficiency: Secondary | ICD-10-CM

## 2019-04-26 DIAGNOSIS — Z0000 Encounter for general adult medical examination without abnormal findings: Secondary | ICD-10-CM | POA: Diagnosis not present

## 2019-04-26 DIAGNOSIS — E538 Deficiency of other specified B group vitamins: Secondary | ICD-10-CM

## 2019-04-26 DIAGNOSIS — E785 Hyperlipidemia, unspecified: Secondary | ICD-10-CM | POA: Diagnosis not present

## 2019-04-26 MED ORDER — LABETALOL HCL 300 MG PO TABS: 300.0000 mg | ORAL_TABLET | Freq: Two times a day (BID) | ORAL | 3 refills | Status: DC

## 2019-04-26 MED ORDER — AMLODIPINE BESYLATE 10 MG PO TABS: 10.0000 mg | ORAL_TABLET | Freq: Every day | ORAL | 3 refills | Status: DC

## 2019-04-26 MED ORDER — NAMZARIC 28-10 MG PO CP24: 1.0000 | ORAL_CAPSULE | Freq: Every day | ORAL | 3 refills | Status: DC

## 2019-04-26 MED ORDER — QUETIAPINE FUMARATE 50 MG PO TABS: ORAL_TABLET | ORAL | 3 refills | Status: DC

## 2019-04-26 MED ORDER — LISINOPRIL 20 MG PO TABS: 20.0000 mg | ORAL_TABLET | Freq: Two times a day (BID) | ORAL | 3 refills | Status: DC

## 2019-04-26 MED ORDER — ATORVASTATIN CALCIUM 10 MG PO TABS: 10.0000 mg | ORAL_TABLET | Freq: Every day | ORAL | 3 refills | Status: DC

## 2019-04-26 NOTE — Assessment & Plan Note (Signed)
I suggested irrigation of bilat wax impactions, daughter states will see ENT instead,  to f/u any worsening symptoms or concerns

## 2019-04-26 NOTE — Patient Instructions (Addendum)
You had the flu shot today  Please continue all other medications as before, and refills have been done if requested.  Please have the pharmacy call with any other refills you may need.  Please continue your efforts at being more active, low cholesterol diet, and weight control.  You are otherwise up to date with prevention measures today.  Please keep your appointments with your specialists as you may have planned  Please see ENT for the wax in the ears as you prefer  Please return in 6 months, or sooner if needed, with Lab testing done 3-5 days before

## 2019-04-26 NOTE — Assessment & Plan Note (Signed)
Goal ldl < 100, stable overall by history and exam, recent data reviewed with pt, and pt to continue medical treatment as before,  to f/u any worsening symptoms or concerns, cont same tx

## 2019-04-26 NOTE — Progress Notes (Signed)
Subjective:    Patient ID: Teresa Riley, female    DOB: 1935-01-18, 84 y.o.   MRN: [id number]  HPI  Here to f/u with daughter; overall doing ok,  Pt denies chest pain, increasing sob or doe, wheezing, orthopnea, PND, increased LE swelling, palpitations, dizziness or syncope.  Pt denies new neurological symptoms such as new headache, or facial or extremity weakness or numbness.  Pt denies polydipsia, polyuria, or low sugar episode.  Pt states overall good compliance with meds, mostly trying to follow appropriate diet, with wt overall stable, and has bilateral left > right hearing loss and Left ear pressure kind of feeling, without fever, d/c, HA, ST, cough.   Past Medical History:  Diagnosis Date  . Allergic rhinitis 09/05/2014  . Allergic rhinitis, cause unspecified 10/29/2010  . Chronic constipation 10/29/2010  . CKD (chronic kidney disease) stage 3, GFR 30-59 ml/min 04/28/2017  . Eczema 10/29/2010  . Gait difficulty   . H/O: hysterectomy 10/29/2010  . History of cervical cancer 10/29/2010  . HTN (hypertension) 10/29/2010  . Hyperlipidemia   . Hypertension   . Impaired glucose tolerance 08/16/2012  . Loss of appetite   . Memory loss   . Orthostatic hypotension 06/05/2012  . Osteoporosis   . Right hip pain   . Seizures ([place])   . Vascular dementia ([place])   . Vitamin D deficiency 09/05/2014   Past Surgical History:  Procedure Laterality Date  . ABDOMINAL HYSTERECTOMY  1970  . BREAST BIOPSY  1980's   benign  . CATARACT EXTRACTION W/ INTRAOCULAR LENS  IMPLANT, BILATERAL  01/2011  . EYE SURGERY      reports that she quit smoking about 47 years ago. Her smoking use included cigarettes. She has never used smokeless tobacco. She reports that she does not drink alcohol or use drugs. family history includes Diabetes in her father; High blood pressure in her daughter; Hypertension in her father and mother. No Known Allergies Current Outpatient Medications on File Prior to Visit  Medication Sig  Dispense Refill  . acetaminophen (TYLENOL) 325 MG tablet Take 325-650 mg by mouth 2 (two) times daily as needed (eye pain).    Marland Kitchen albuterol (PROAIR HFA) 108 (90 Base) MCG/ACT inhaler Inhale 2 puffs into the lungs every 6 (six) hours as needed for wheezing or shortness of breath. 18 g 11  . ALPRAZolam (XANAX) 0.25 MG tablet TAKE 1 TABLET BY MOUTH TWICE DAILY AS NEEDED. 60 tablet 2  . aspirin EC 81 MG tablet Take 81 mg by mouth daily.     . Calcium-Magnesium-Vitamin D (CALCIUM MAGNESIUM PO) Take 1 tablet by mouth daily with lunch.     . citalopram (CELEXA) 10 MG tablet Take 1 tablet (10 mg total) by mouth daily. 90 tablet 3  . denosumab (PROLIA) 60 MG/ML SOSY injection Inject 60 mg into the skin every 6 (six) months.    . fluocinonide cream (LIDEX) 0.05 % Apply topically 2 (two) times daily. 30 g 1  . fluticasone (FLONASE) 50 MCG/ACT nasal spray Place 1 spray into both nostrils daily.    Marland Kitchen guaiFENesin (MUCINEX) 600 MG 12 hr tablet Take 2 tablets (1,200 mg total) by mouth 2 (two) times daily as needed. 120 tablet 1  . hydrocortisone cream 1 % Apply 1 application topically 2 (two) times daily as needed for itching (eczema/rash).    . loratadine (CLARITIN) 10 MG tablet Take 10 mg by mouth daily as needed for allergies or rhinitis.     . Misc. Devices (  CANE) MISC Use as directed daily; 1 each 0  . Omega-3 Fatty Acids (OMEGA 3 PO) Take 1 capsule by mouth daily with lunch.     Bertram Gala Glycol-Propyl Glycol (SYSTANE OP) Place 1 drop into both eyes daily as needed (dry eyes).      No current facility-administered medications on file prior to visit.    Review of Systems   Constitutional: Negative for other unusual diaphoresis or sweats HENT: Negative for ear discharge or swelling Eyes: Negative for other worsening visual disturbances Respiratory: Negative for stridor or other swelling  Gastrointestinal: Negative for worsening distension or other blood Genitourinary: Negative for retention or other  urinary change Musculoskeletal: Negative for other MSK pain or swelling Skin: Negative for color change or other new lesions Neurological: Negative for worsening tremors and other numbness  Psychiatric/Behavioral: Negative for worsening agitation or other fatigue All otherwise neg per pt    Objective:   Physical Exam BP 134/86   Pulse 74   Temp 97.9 F (36.6 C) (Oral)   Ht 5\' 4"  (1.626 m)   Wt 173 lb (78.5 kg)   SpO2 95%   BMI 29.70 kg/m  VS noted,  Constitutional: Pt appears in NAD HENT: Head: NCAT.  Right Ear: External ear normal.  Left Ear: External ear normal.  bilat canals with wax impactions Eyes: . Pupils are equal, round, and reactive to light. Conjunctivae and EOM are normal Nose: without d/c or deformity Neck: Neck supple. Gross normal ROM Cardiovascular: Normal rate and regular rhythm.   Pulmonary/Chest: Effort normal and breath sounds without rales or wheezing.  Abd:  Soft, NT, ND, + BS, no organomegaly Neurological: Pt is alert. At baseline orientation, motor grossly intact Skin: Skin is warm. No rashes, other new lesions, no LE edema Psychiatric: Pt behavior is normal without agitation  All otherwise neg per pt Lab Results  Component Value Date   WBC 5.0 07/13/2018   HGB 12.6 07/13/2018   HCT 38.1 07/13/2018   PLT 234.0 07/13/2018   GLUCOSE 118 (H) 07/13/2018   CHOL 201 (H) 07/13/2018   TRIG 115.0 07/13/2018   HDL 76.20 07/13/2018   LDLDIRECT 93.0 09/16/2015   LDLCALC 102 (H) 07/13/2018   ALT 17 07/13/2018   AST 24 07/13/2018   NA 143 07/13/2018   K 4.0 07/13/2018   CL 109 07/13/2018   CREATININE 0.98 07/13/2018   BUN 19 07/13/2018   CO2 27 07/13/2018   TSH 0.75 07/13/2018   HGBA1C 5.6 01/16/2019      Assessment & Plan:

## 2019-04-26 NOTE — Assessment & Plan Note (Signed)
stable overall by history and exam, recent data reviewed with pt, and pt to continue medical treatment as before,  to f/u any worsening symptoms or concerns  

## 2019-05-04 DIAGNOSIS — H6123 Impacted cerumen, bilateral: Secondary | ICD-10-CM | POA: Diagnosis not present

## 2019-06-04 ENCOUNTER — Other Ambulatory Visit: Payer: Self-pay | Admitting: Internal Medicine

## 2019-06-04 MED ORDER — CITALOPRAM HYDROBROMIDE 10 MG PO TABS: 10.0000 mg | ORAL_TABLET | Freq: Every day | ORAL | 1 refills | Status: DC

## 2019-06-04 NOTE — Telephone Encounter (Signed)
Medication Refill - Medication: citalopram (CELEXA) 10 MG tablet [[id number]]   95 day supply    Preferred Pharmacy (with phone number or street name):  [facility] [place], [place] - [address] AT [address]  [place] [address]  Phone: [phone] Fax: [phone]     Agent: Please be advised that RX refills may take up to 3 business days. We ask that you follow-up with your pharmacy.

## 2019-06-04 NOTE — Telephone Encounter (Signed)
Refill approved according to protocol. 

## 2019-06-05 ENCOUNTER — Other Ambulatory Visit: Payer: Self-pay | Admitting: Internal Medicine

## 2019-06-05 MED ORDER — ALPRAZOLAM 0.25 MG PO TABS: 0.2500 mg | ORAL_TABLET | Freq: Two times a day (BID) | ORAL | 2 refills | Status: DC | PRN

## 2019-06-05 NOTE — Telephone Encounter (Signed)
Requested medication (s) are due for refill today: yes  Requested medication (s) are on the active medication list: yes  Last refill:  10/05/2018  Future visit scheduled: no  Notes to clinic:  Refill cannot be delegated    Requested Prescriptions  Pending Prescriptions Disp Refills   ALPRAZolam (XANAX) 0.25 MG tablet 60 tablet 2    Sig: Take 1 tablet (0.25 mg total) by mouth 2 (two) times daily as needed.     Not Delegated - Psychiatry:  Anxiolytics/Hypnotics Failed - 06/05/2019  8:41 AM      Failed - This refill cannot be delegated      Failed - Urine Drug Screen completed in last 360 days.      Passed - Valid encounter within last 6 months    Recent Outpatient Visits          1 month ago Flu vaccine need   [name], James W, MD   4 months ago Acute dysfunction of left eustachian tube   [name], James W, MD   10 months ago Preventative health care   [facility] -Georges Mouse, [facility]   1 year ago Urinary retention   [facility] -Georges Mouse, [facility]   2 years ago CKD (chronic kidney disease) stage 3, GFR 30-59 ml/min [facility])   [facility] -Georges Mouse, [facility]

## 2019-06-05 NOTE — Telephone Encounter (Signed)
Medication Refill - Medication: ALPRAZolam (XANAX) 0.25 MG tablet    Preferred Pharmacy (with phone number or street name):  [facility] [place], [place] - [address] [phone] (Phone) [phone] (Fax)     Agent: Please be advised that RX refills may take up to 3 business days. We ask that you follow-up with your pharmacy.

## 2019-06-05 NOTE — Telephone Encounter (Signed)
Done erx 

## 2019-07-03 ENCOUNTER — Ambulatory Visit: Payer: Self-pay

## 2019-07-03 NOTE — Telephone Encounter (Signed)
Incoming call from Daughter Levada Dy  Incoming call from daughter of patient Teresa Riley) who states that her Mother has been wheezing lately.   Onset was 7 weeks ago.       It was come and go now constant.  Rated mild to moderate.  Recurent    Hx of high blood Pressure    Denies any other Sx.  Reviewed   Protocol with daughter recommended Patient go to urgent care or ED.  Daughter states that patient wont go tonight       Maybe in the morning.  Tried to Encourage pt to encourage patient to go   To go.  Reason for Disposition . Wheezing can be heard across the room  Answer Assessment - Initial Assessment Questions 1. RESPIRATORY STATUS: "Describe your breathing?" (e.g., wheezing, shortness of breath, unable to speak, severe coughing)     wheezing 2. ONSET: "When did this breathing problem begin?"      7 weeks ago3. PATTERN "Does the difficult breathing come and go, or has it been constant since it  started?"     Come and go now constant 4. SEVERITY: "How bad is your breathing?" (e.g., mild, moderate, severe)    - MILD: No SOB at rest, mild SOB with walking, speaks normally in sentences, can lay down, no retractions, pulse < 100.    - MODERATE: SOB at rest, SOB with minimal exertion and prefers to sit, cannot lie down flat, speaks in phrases, mild retractions, audible wheezing, pulse 100-120.    - SEVERE: Very SOB at rest, speaks in single words, struggling to breathe, sitting hunched forward, retractions, pulse > 120     Mild to modrate 5. RECURRENT SYMPTOM: "Have you had difficulty breathing before?" If so, ask: "When was the last time?" and "What happened that time?"     yes 6. CARDIAC HISTORY: "Do you have any history of heart disease?" (e.g., heart attack, angina, bypass surgery, angioplasty)     High blood preesure 7. LUNG HISTORY: "Do you have any history of lung disease?"  (e.g., pulmonary embolus, asthma, emphysema)   denies 8. CAUSE: "What do you think is causing the breathing problem?"      *No Answer* 9. OTHER SYMPTOMS: "Do you have any other symptoms? (e.g., dizziness, runny nose, cough, chest pain, fever)     *No Answer*denies 10. PREGNANCY: "Is there any chance you are pregnant?" "When was your last menstrual period?"       na 11. TRAVEL: "Have you traveled out of the country in the last month?" (e.g., travel history, exposures)       denies  Protocols used: BREATHING DIFFICULTY-A-AH

## 2019-07-04 ENCOUNTER — Other Ambulatory Visit: Payer: Self-pay

## 2019-07-04 ENCOUNTER — Encounter (HOSPITAL_COMMUNITY): Payer: Self-pay | Admitting: Emergency Medicine

## 2019-07-04 ENCOUNTER — Ambulatory Visit (HOSPITAL_COMMUNITY)
Admission: EM | Admit: 2019-07-04 | Discharge: 2019-07-04 | Disposition: A | Discharge status: Home / Self Care | Payer: Medicare HMO | Attending: Emergency Medicine | Admitting: Emergency Medicine

## 2019-07-04 DIAGNOSIS — J9801 Acute bronchospasm: Secondary | ICD-10-CM | POA: Diagnosis not present

## 2019-07-04 MED ORDER — AEROCHAMBER PLUS FLO-VU LARGE MISC
1.0000 | Freq: Once | Status: AC
  Administered 2019-07-04: 14:00:00 1

## 2019-07-04 MED ORDER — PREDNISONE 10 MG PO TABS: 20.0000 mg | ORAL_TABLET | Freq: Every day | ORAL | 0 refills | Status: AC

## 2019-07-04 MED ORDER — ALBUTEROL SULFATE HFA 108 (90 BASE) MCG/ACT IN AERS: 1.0000 | INHALATION_SPRAY | Freq: Four times a day (QID) | RESPIRATORY_TRACT | 1 refills | Status: DC | PRN

## 2019-07-04 MED ORDER — ALBUTEROL SULFATE HFA 108 (90 BASE) MCG/ACT IN AERS: 2.0000 | INHALATION_SPRAY | Freq: Four times a day (QID) | RESPIRATORY_TRACT | 1 refills | Status: DC | PRN

## 2019-07-04 MED ORDER — AEROCHAMBER PLUS FLO-VU LARGE MISC
Status: AC
  Filled 2019-07-04: qty 1

## 2019-07-04 NOTE — Discharge Instructions (Addendum)
Advised patient to take medication as prescribed To follow-up with primary care To return for worsening of symptoms

## 2019-07-04 NOTE — ED Triage Notes (Signed)
Wheezing for a couple weeks, symptoms have become more consistent  No coughing, runny nose, fever

## 2019-07-04 NOTE — ED Provider Notes (Signed)
[facility]    CSN: [id number] Arrival date & time: 07/04/19  1205      History   Chief Complaint Chief Complaint  Patient presents with  . Wheezing    HPI Teresa Riley is a 84 y.o. female.   Teresa Riley 84 years old female presented to the urgent care with a complaint of wheezing for the past 1 week  It develop gradually and it is heard when she is exercising.  She describes it as constant. She has tried Albuterol without relief because she doesn't have a spacer.  Her symptoms are made worse with upper respiratory infection She denies similar symptoms in the past.        Past Medical History:  Diagnosis Date  . Allergic rhinitis 09/05/2014  . Allergic rhinitis, cause unspecified 10/29/2010  . Chronic constipation 10/29/2010  . CKD (chronic kidney disease) stage 3, GFR 30-59 ml/min 04/28/2017  . Eczema 10/29/2010  . Gait difficulty   . H/O: hysterectomy 10/29/2010  . History of cervical cancer 10/29/2010  . HTN (hypertension) 10/29/2010  . Hyperlipidemia   . Hypertension   . Impaired glucose tolerance 08/16/2012  . Loss of appetite   . Memory loss   . Orthostatic hypotension 06/05/2012  . Osteoporosis   . Right hip pain   . Seizures (HCC)   . Vascular dementia (HCC)   . Vitamin D deficiency 09/05/2014    Patient Active Problem List   Diagnosis Date Noted  . Acute dysfunction of left eustachian tube 01/16/2019  . Hyperglycemia 01/16/2019  . CKD (chronic kidney disease) stage 3, GFR 30-59 ml/min 04/28/2017  . Hemorrhoids 04/28/2017  . Bilateral hearing loss 11/06/2016  . Headache 11/05/2016  . Thrush 11/05/2016  . Paresthesia 09/27/2016  . Vertigo 07/04/2016  . Urinary retention 06/29/2016  . Gait disorder 06/29/2016  . General weakness 09/16/2015  . Seizures (HCC) 08/27/2015  . Dry eyes 05/27/2015  . Dry skin 03/12/2015  . Rash and nonspecific skin eruption 03/12/2015  . Renal insufficiency 03/12/2015  . Esophageal dysmotility 01/15/2015  . Colon  cancer screening - declined 01/15/2015  . Syncope and collapse 10/03/2014  . Constipation 09/05/2014  . Preventative health care 09/05/2014  . Osteoporosis 09/05/2014  . Vitamin D deficiency 09/05/2014  . Allergic rhinitis 09/05/2014  . 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  . Insomnia 10/02/2013  . Weight loss 04/04/2013  . Localization-related focal epilepsy with simple partial seizures (HCC) 11/15/2012  . Dementia (HCC) 11/15/2012  . Hyperlipidemia 08/06/2012  . HTN (hypertension) 10/29/2010  . Eczema 10/29/2010  . History of cervical cancer 10/29/2010    Past Surgical History:  Procedure Laterality Date  . ABDOMINAL HYSTERECTOMY  1970  . BREAST BIOPSY  1980's   benign  . CATARACT EXTRACTION W/ INTRAOCULAR LENS  IMPLANT, BILATERAL  01/2011  . EYE SURGERY      OB History   No obstetric history on file.      Home Medications    Prior to Admission medications   Medication Sig Start Date End Date Taking? Authorizing Provider  ALPRAZolam (XANAX) 0.25 MG tablet Take 1 tablet (0.25 mg total) by mouth 2 (two) times daily as needed. 06/05/19  Yes Corwin Levins, MD  amLODipine (NORVASC) 10 MG tablet Take 1 tablet (10 mg total) by mouth daily. 04/26/19  Yes Corwin Levins, MD  aspirin EC 81 MG tablet Take 81 mg by mouth daily.    Yes [provider]  atorvastatin (LIPITOR) 10 MG tablet Take 1 tablet (10  mg total) by mouth daily at 6 PM. 04/26/19  Yes Biagio Borg, MD  Calcium-Magnesium-Vitamin D (CALCIUM MAGNESIUM PO) Take 1 tablet by mouth daily with lunch.    Yes [provider]  citalopram (CELEXA) 10 MG tablet Take 1 tablet (10 mg total) by mouth daily. 06/04/19 06/03/20 Yes Biagio Borg, MD  labetalol (NORMODYNE) 300 MG tablet Take 1 tablet (300 mg total) by mouth 2 (two) times daily. 04/26/19  Yes Biagio Borg, MD  lisinopril (ZESTRIL) 20 MG tablet Take 1 tablet (20 mg total) by mouth 2 (two) times daily. 04/26/19  Yes Biagio Borg, MD  Omega-3 Fatty Acids  (OMEGA 3 PO) Take 1 capsule by mouth daily with lunch.    Yes [provider]  QUEtiapine (SEROQUEL) 50 MG tablet TAKE 1 TABLET(50 MG) BY MOUTH AT BEDTIME 04/26/19  Yes Biagio Borg, MD  acetaminophen (TYLENOL) 325 MG tablet Take 325-650 mg by mouth 2 (two) times daily as needed (eye pain).    [provider]  albuterol (PROAIR HFA) 108 (90 Base) MCG/ACT inhaler Inhale 2 puffs into the lungs every 6 (six) hours as needed for wheezing or shortness of breath. 07/04/19   Keoki Mchargue, Darrelyn Hillock, FNP  denosumab (PROLIA) 60 MG/ML SOSY injection Inject 60 mg into the skin every 6 (six) months.    [provider]  fluocinonide cream (LIDEX) 0.05 % Apply topically 2 (two) times daily. 04/28/17   Biagio Borg, MD  fluticasone (FLONASE) 50 MCG/ACT nasal spray Place 1 spray into both nostrils daily.    [provider]  guaiFENesin (MUCINEX) 600 MG 12 hr tablet Take 2 tablets (1,200 mg total) by mouth 2 (two) times daily as needed. 01/16/19   Biagio Borg, MD  hydrocortisone cream 1 % Apply 1 application topically 2 (two) times daily as needed for itching (eczema/rash).    [provider]  loratadine (CLARITIN) 10 MG tablet Take 10 mg by mouth daily as needed for allergies or rhinitis.     [provider]  Memantine HCl-Donepezil HCl (NAMZARIC) 28-10 MG CP24 Take 1 capsule by mouth at bedtime. 04/26/19   Biagio Borg, MD  Misc. Devices [facility]) MISC Use as directed daily; 12/11/18   Suzzanne Cloud, NP  Polyethyl Glycol-Propyl Glycol (SYSTANE OP) Place 1 drop into both eyes daily as needed (dry eyes).     [provider]    Family History Family History  Problem Relation Age of Onset  . Hypertension Mother   . Hypertension Father   . Diabetes Father   . High blood pressure Daughter        x2  . Colon cancer Neg Hx   . Colon polyps Neg Hx   . Kidney disease Neg Hx   . Gallbladder disease Neg Hx     Social History Social History   Tobacco Use    . Smoking status: Former Smoker    Types: Cigarettes    Quit date: 07/13/1971    Years since quitting: 48.0  . Smokeless tobacco: Never Used  . Tobacco comment: quit in 1973  Substance Use Topics  . Alcohol use: No    Alcohol/week: 0.0 standard drinks  . Drug use: No     Allergies   Patient has no known allergies.   Review of Systems Review of Systems  Constitutional: Negative.   HENT: Negative.   Respiratory: Negative.   Cardiovascular: Negative.   ROS: All other are negatives   Physical Exam  Triage Vital Signs ED Triage Vitals  Enc Vitals Group     BP 07/04/19 1233 (!) 161/75     Pulse Rate 07/04/19 1233 69     Resp 07/04/19 1233 16     Temp 07/04/19 1233 98 F (36.7 C)     Temp Source 07/04/19 1233 Oral     SpO2 07/04/19 1233 97 %     Weight --      Height --      Head Circumference --      Peak Flow --      Pain Score 07/04/19 1330 0     Pain Loc --      Pain Edu? --      Excl. in GC? --    No data found.  Updated Vital Signs BP (!) 161/75 (BP Location: Right Arm)   Pulse 69   Temp 98 F (36.7 C) (Oral)   Resp 16   SpO2 97%   Visual Acuity Right Eye Distance:   Left Eye Distance:   Bilateral Distance:    Right Eye Near:   Left Eye Near:    Bilateral Near:     Physical Exam Constitutional:      General: She is not in acute distress.    Appearance: Normal appearance. She is normal weight. She is not ill-appearing or toxic-appearing.  Cardiovascular:     Rate and Rhythm: Normal rate and regular rhythm.     Pulses: Normal pulses.     Heart sounds: Normal heart sounds. No murmur.  Pulmonary:     Effort: Pulmonary effort is normal. No respiratory distress.     Breath sounds: No wheezing, rhonchi or rales.  Chest:     Chest wall: No tenderness.  Neurological:     Mental Status: She is alert and oriented to person, place, and time.      UC Treatments / Results  Labs (all labs ordered are listed, but only abnormal results are  displayed) Labs Reviewed - No data to display  EKG   Radiology No results found.  Procedures Procedures (including critical care time)  Medications Ordered in UC Medications  AeroChamber Plus Flo-Vu Large MISC 1 each (has no administration in time range)    Initial Impression / Assessment and Plan / UC Course  I have reviewed the triage vital signs and the nursing notes.  Pertinent labs & imaging results that were available during my care of the patient were reviewed by me and considered in my medical decision making (see chart for details).   Patient stable for discharge.  Benign physical exam.  Patient lung sound are clear.  Prednisone and   Albuterol was refilled. A  Spacer was given at the urgent care. Advised patient to follow-up with primary care if new symptoms of wheezing develop.  To return for worsening of symptoms.  Patient verbalized an understanding  of the plan of care. Final Clinical Impressions(s) / UC Diagnoses   Final diagnoses:  Bronchospasm     Discharge Instructions     Advised patient to take medication as prescribed To follow-up with primary care To return for worsening of symptoms    ED Prescriptions    Medication Sig Dispense Auth. Provider   albuterol (PROAIR HFA) 108 (90 Base) MCG/ACT inhaler Inhale 2 puffs into the lungs every 6 (six) hours as needed for wheezing or shortness of breath. 18 g Durward ParcelAvegno, Jalen Daluz S, FNP     PDMP not reviewed this encounter.   Nena JordanAvegno, Wynette Jersey  S, FNP 07/04/19 1418

## 2019-08-15 ENCOUNTER — Telehealth: Payer: Self-pay | Admitting: Internal Medicine

## 2019-08-15 NOTE — Telephone Encounter (Signed)
   Pt c/o medication issue:  1. Name of Medication: albuterol (VENTOLIN HFA) 108 (90 Base) MCG/ACT inhaler 2. How are you currently taking this medication (dosage and times per day)? As written  3. Are you having a reaction (difficulty breathing--STAT)? no  4. What is your medication issue? Patients daughter calling to request alternative medication. Patient has dementia, unable to properly use

## 2019-08-18 MED ORDER — ALBUTEROL SULFATE HFA 108 (90 BASE) MCG/ACT IN AERS: 1.0000 | INHALATION_SPRAY | Freq: Four times a day (QID) | RESPIRATORY_TRACT | 2 refills | Status: DC | PRN

## 2019-08-18 NOTE — Telephone Encounter (Signed)
Done erx 

## 2019-10-06 ENCOUNTER — Other Ambulatory Visit: Payer: Self-pay | Admitting: Internal Medicine

## 2019-10-06 NOTE — Telephone Encounter (Signed)
Please refill as per office routine med refill policy (all routine meds refilled for 3 mo or monthly per pt preference up to one year from last visit, then month to month grace period for 3 mo, then further med refills will have to be denied)  

## 2019-10-17 ENCOUNTER — Encounter: Payer: Self-pay | Admitting: Internal Medicine

## 2019-10-17 ENCOUNTER — Ambulatory Visit (INDEPENDENT_AMBULATORY_CARE_PROVIDER_SITE_OTHER): Payer: Medicare HMO

## 2019-10-17 ENCOUNTER — Telehealth: Payer: Self-pay | Admitting: Specialist

## 2019-10-17 ENCOUNTER — Other Ambulatory Visit: Payer: Self-pay

## 2019-10-17 ENCOUNTER — Ambulatory Visit (INDEPENDENT_AMBULATORY_CARE_PROVIDER_SITE_OTHER): Payer: Medicare HMO | Admitting: Internal Medicine

## 2019-10-17 VITALS — BP 140/70 | HR 70 | Temp 99.4°F | Ht 64.0 in | Wt 191.8 lb

## 2019-10-17 DIAGNOSIS — N183 Chronic kidney disease, stage 3 unspecified: Secondary | ICD-10-CM

## 2019-10-17 DIAGNOSIS — Z0001 Encounter for general adult medical examination with abnormal findings: Secondary | ICD-10-CM | POA: Diagnosis not present

## 2019-10-17 DIAGNOSIS — R0602 Shortness of breath: Secondary | ICD-10-CM | POA: Diagnosis not present

## 2019-10-17 DIAGNOSIS — R635 Abnormal weight gain: Secondary | ICD-10-CM | POA: Diagnosis not present

## 2019-10-17 DIAGNOSIS — I10 Essential (primary) hypertension: Secondary | ICD-10-CM

## 2019-10-17 DIAGNOSIS — H9193 Unspecified hearing loss, bilateral: Secondary | ICD-10-CM

## 2019-10-17 DIAGNOSIS — R062 Wheezing: Secondary | ICD-10-CM

## 2019-10-17 DIAGNOSIS — F418 Other specified anxiety disorders: Secondary | ICD-10-CM | POA: Diagnosis not present

## 2019-10-17 DIAGNOSIS — E611 Iron deficiency: Secondary | ICD-10-CM | POA: Diagnosis not present

## 2019-10-17 DIAGNOSIS — E538 Deficiency of other specified B group vitamins: Secondary | ICD-10-CM | POA: Diagnosis not present

## 2019-10-17 DIAGNOSIS — R739 Hyperglycemia, unspecified: Secondary | ICD-10-CM

## 2019-10-17 DIAGNOSIS — E559 Vitamin D deficiency, unspecified: Secondary | ICD-10-CM

## 2019-10-17 MED ORDER — LISINOPRIL 40 MG PO TABS: 40.0000 mg | ORAL_TABLET | Freq: Every day | ORAL | 3 refills | Status: DC

## 2019-10-17 MED ORDER — SAXENDA 18 MG/3ML ~~LOC~~ SOPN: 0.6000 mg | PEN_INJECTOR | SUBCUTANEOUS | 3 refills | Status: DC

## 2019-10-17 MED ORDER — LABETALOL HCL 300 MG PO TABS: ORAL_TABLET | ORAL | 3 refills | Status: DC

## 2019-10-17 MED ORDER — NAMZARIC 28-10 MG PO CP24: 1.0000 | ORAL_CAPSULE | Freq: Every day | ORAL | 3 refills | Status: DC

## 2019-10-17 MED ORDER — ALBUTEROL SULFATE (2.5 MG/3ML) 0.083% IN NEBU: 2.5000 mg | INHALATION_SOLUTION | Freq: Four times a day (QID) | RESPIRATORY_TRACT | 1 refills | Status: DC | PRN

## 2019-10-17 MED ORDER — QUETIAPINE FUMARATE 50 MG PO TABS: ORAL_TABLET | ORAL | 3 refills | Status: DC

## 2019-10-17 MED ORDER — ALPRAZOLAM 0.25 MG PO TABS: 0.2500 mg | ORAL_TABLET | Freq: Two times a day (BID) | ORAL | 2 refills | Status: DC | PRN

## 2019-10-17 MED ORDER — AMLODIPINE BESYLATE 10 MG PO TABS: ORAL_TABLET | ORAL | 3 refills | Status: DC

## 2019-10-17 NOTE — Telephone Encounter (Signed)
-----   Message from Nada Maclachlan, CMA sent at 10/17/2019 11:51 AM EDT ----- Regarding: prolia Pt's daughter called about getting pt scheduled for a prolia injection.  Callback # [phone]

## 2019-10-17 NOTE — Assessment & Plan Note (Signed)
stable overall by history and exam, recent data reviewed with pt, and pt to continue medical treatment as before,  to f/u any worsening symptoms or concerns  

## 2019-10-17 NOTE — Progress Notes (Signed)
Subjective:    Patient ID: Teresa Riley, female    DOB: 04-25-1935, 85 y.o.   MRN: [id number]  HPI  Here for wellness and f/u;  Overall doing ok;  Pt denies Chest pain, orthopnea, PND, worsening LE edema, palpitations, dizziness or syncope, but daughter states a kind of wheeziness is heard with pt exertion for several wks.  Pt denies neurological change such as new headache, facial or extremity weakness.  Pt denies polydipsia, polyuria, or low sugar symptoms. Pt states overall good compliance with treatment and medications, good tolerability, and has been trying to follow appropriate diet.  Pt denies worsening depressive symptoms, suicidal ideation or panic. No fever, night sweats, wt loss, loss of appetite, or other constitutional symptoms.  Pt states good ability with ADL's, has low fall risk, home safety reviewed and adequate, no other significant changes in hearing or vision, and has gained significant wt. Wt Readings from Last 3 Encounters:  10/17/19 191 lb 12.8 oz (87 kg)  04/26/19 173 lb (78.5 kg)  01/16/19 171 lb (77.6 kg)  Pt has bilateral wax impactions with reduced hearing and daughter states will have her see ENT as before Past Medical History:  Diagnosis Date  . Allergic rhinitis 09/05/2014  . Allergic rhinitis, cause unspecified 10/29/2010  . Chronic constipation 10/29/2010  . CKD (chronic kidney disease) stage 3, GFR 30-59 ml/min 04/28/2017  . Eczema 10/29/2010  . Gait difficulty   . H/O: hysterectomy 10/29/2010  . History of cervical cancer 10/29/2010  . HTN (hypertension) 10/29/2010  . Hyperlipidemia   . Hypertension   . Impaired glucose tolerance 08/16/2012  . Loss of appetite   . Memory loss   . Orthostatic hypotension 06/05/2012  . Osteoporosis   . Right hip pain   . Seizures (HCC)   . Vascular dementia (HCC)   . Vitamin D deficiency 09/05/2014   Past Surgical History:  Procedure Laterality Date  . ABDOMINAL HYSTERECTOMY  1970  . BREAST BIOPSY  1980's   benign  .  CATARACT EXTRACTION W/ INTRAOCULAR LENS  IMPLANT, BILATERAL  01/2011  . EYE SURGERY      reports that she quit smoking about 48 years ago. Her smoking use included cigarettes. She has never used smokeless tobacco. She reports that she does not drink alcohol or use drugs. family history includes Diabetes in her father; High blood pressure in her daughter; Hypertension in her father and mother. No Known Allergies Current Outpatient Medications on File Prior to Visit  Medication Sig Dispense Refill  . acetaminophen (TYLENOL) 325 MG tablet Take 325-650 mg by mouth 2 (two) times daily as needed (eye pain).    Marland Kitchen aspirin EC 81 MG tablet Take 81 mg by mouth daily.     Marland Kitchen atorvastatin (LIPITOR) 10 MG tablet Take 1 tablet (10 mg total) by mouth daily at 6 PM. 90 tablet 3  . Calcium-Magnesium-Vitamin D (CALCIUM MAGNESIUM PO) Take 1 tablet by mouth daily with lunch.     . citalopram (CELEXA) 10 MG tablet Take 1 tablet (10 mg total) by mouth daily. 90 tablet 1  . denosumab (PROLIA) 60 MG/ML SOSY injection Inject 60 mg into the skin every 6 (six) months.    . fluocinonide cream (LIDEX) 0.05 % Apply topically 2 (two) times daily. 30 g 1  . fluticasone (FLONASE) 50 MCG/ACT nasal spray Place 1 spray into both nostrils daily.    Marland Kitchen guaiFENesin (MUCINEX) 600 MG 12 hr tablet Take 2 tablets (1,200 mg total) by mouth 2 (two)  times daily as needed. 120 tablet 1  . hydrocortisone cream 1 % Apply 1 application topically 2 (two) times daily as needed for itching (eczema/rash).    . loratadine (CLARITIN) 10 MG tablet Take 10 mg by mouth daily as needed for allergies or rhinitis.     . Misc. Devices (CANE) MISC Use as directed daily; 1 each 0  . Omega-3 Fatty Acids (OMEGA 3 PO) Take 1 capsule by mouth daily with lunch.     Vladimir Faster Glycol-Propyl Glycol (SYSTANE OP) Place 1 drop into both eyes daily as needed (dry eyes).      No current facility-administered medications on file prior to visit.   Review of Systems All  otherwise neg per pt    Objective:   Physical Exam BP 140/70   Pulse 70   Temp 99.4 F (37.4 C)   Ht 5\' 4"  (1.626 m)   Wt 191 lb 12.8 oz (87 kg)   SpO2 99%   BMI 32.92 kg/m  VS noted,  Constitutional: Pt appears in NAD HENT: Head: NCAT.  Right Ear: External ear normal.  Left Ear: External ear normal.  Eyes: . Pupils are equal, round, and reactive to light. Conjunctivae and EOM are normal Nose: without d/c or deformity Neck: Neck supple. Gross normal ROM Cardiovascular: Normal rate and regular rhythm.   Pulmonary/Chest: Effort normal and breath sounds without rales or wheezing.  Abd:  Soft, NT, ND, + BS, no organomegaly Neurological: Pt is alert. At baseline orientation, motor grossly intact Skin: Skin is warm. No rashes, other new lesions, no LE edema Psychiatric: Pt behavior is normal without agitation  All otherwise neg per pt Lab Results  Component Value Date   WBC 5.0 07/13/2018   HGB 12.6 07/13/2018   HCT 38.1 07/13/2018   PLT 234.0 07/13/2018   GLUCOSE 118 (H) 07/13/2018   CHOL 201 (H) 07/13/2018   TRIG 115.0 07/13/2018   HDL 76.20 07/13/2018   LDLDIRECT 93.0 09/16/2015   LDLCALC 102 (H) 07/13/2018   ALT 17 07/13/2018   AST 24 07/13/2018   NA 143 07/13/2018   K 4.0 07/13/2018   CL 109 07/13/2018   CREATININE 0.98 07/13/2018   BUN 19 07/13/2018   CO2 27 07/13/2018   TSH 0.75 07/13/2018   HGBA1C 5.6 01/16/2019      Assessment & Plan:

## 2019-10-17 NOTE — Progress Notes (Signed)
   Subjective:    Patient ID: Teresa Riley, female    DOB: 30-Mar-1935, 85 y.o.   MRN: [id number]  HPI   Wt Readings from Last 3 Encounters:  10/17/19 191 lb 12.8 oz (87 kg)  04/26/19 173 lb (78.5 kg)  01/16/19 171 lb (77.6 kg)   BP Readings from Last 3 Encounters:  10/17/19 140/70  07/04/19 (!) 161/75  04/26/19 134/86     Review of Systems     Objective:   Physical Exam BP 140/70   Pulse 70   Temp 99.4 F (37.4 C)   Ht 5\' 4"  (1.626 m)   Wt 191 lb 12.8 oz (87 kg)   SpO2 99%   BMI 32.92 kg/m         Assessment & Plan:

## 2019-10-17 NOTE — Assessment & Plan Note (Signed)
Agree with pt to see ENT per daughter will handle

## 2019-10-17 NOTE — Assessment & Plan Note (Signed)
Improved, cont SSRI

## 2019-10-17 NOTE — Telephone Encounter (Signed)
Left message for patient's daughter to call back.Molli Knock to give today at her visit with Dr Jonny Ruiz if she would like. $265 copay (okay to bill).

## 2019-10-17 NOTE — Assessment & Plan Note (Signed)
Continue oral replacement, for f/u lab

## 2019-10-17 NOTE — Assessment & Plan Note (Signed)
Mild uncontrolled, for increased lisiopril to 40 qd

## 2019-10-17 NOTE — Assessment & Plan Note (Addendum)
Exam benign, suspect probable wt gain and hard breathing with exertions, for cxr, also trial albut nebs per duaghter request  I spent 45 minutes in addition to time for CPX wellness examination in preparing to see the patient by review of recent labs, imaging and procedures, obtaining and reviewing separately obtained history, communicating with the patient and family or caregiver, ordering medications, tests or procedures, and documenting clinical information in the EHR including the differential Dx, treatment, and any further evaluation and other management of wheezing, wt gain, hyperglycemia, HTN, CKD, depression , hearing loss, vit d deficiency

## 2019-10-17 NOTE — Assessment & Plan Note (Signed)
For saxenda asd per pt and daughter request

## 2019-10-17 NOTE — Patient Instructions (Signed)
Please take all new medication as prescribed - the saxenda if ok with the insurance, for wt loss  Ok to increase the lisinopril to 40 mg per day, and start the nebulizer treatments as needed  Please continue all other medications as before, and refills have been done if requested.  Please have the pharmacy call with any other refills you may need.  Please continue your efforts at being more active, low cholesterol diet, and weight control.  You are otherwise up to date with prevention measures today.  Please keep your appointments with your specialists as you may have planned , such as ENT for the ear wax impactions  Please go to the XRAY Department in the first floor for the x-ray testing  Please go to the LAB at the blood drawing area for the tests to be done  You will be contacted by phone if any changes need to be made immediately.  Otherwise, you will receive a letter about your results with an explanation, but please check with MyChart first.  Please remember to sign up for MyChart if you have not done so, as this will be important to you in the future with finding out test results, communicating by private email, and scheduling acute appointments online when needed.  Please make an Appointment to return in 6 months, or sooner if needed

## 2019-10-17 NOTE — Assessment & Plan Note (Signed)

## 2019-10-18 ENCOUNTER — Telehealth: Payer: Self-pay | Admitting: Internal Medicine

## 2019-10-18 DIAGNOSIS — J4520 Mild intermittent asthma, uncomplicated: Secondary | ICD-10-CM

## 2019-10-18 NOTE — Telephone Encounter (Signed)
Done hardcopy to Ms Tatum 

## 2019-10-18 NOTE — Telephone Encounter (Signed)
   FYI  Patient scheduled for 04/15

## 2019-10-18 NOTE — Telephone Encounter (Signed)
    Patient's daughter calling, states a prescription is needed for ned machine. She was able to pick up the solution but has no machine.  Please call daughter

## 2019-10-19 ENCOUNTER — Telehealth: Payer: Self-pay

## 2019-10-19 NOTE — Telephone Encounter (Signed)
Key: [id number]  PA completed and has been denied.   Can you call pt at your convenience and inform of same.

## 2019-10-19 NOTE — Telephone Encounter (Signed)
Called and advised of message below. Would like to know how much the medication would be out of pocket. I advised her to check with the pharmacy. No further questions at this time.

## 2019-10-23 ENCOUNTER — Encounter: Payer: Self-pay | Admitting: Internal Medicine

## 2019-10-25 ENCOUNTER — Ambulatory Visit (INDEPENDENT_AMBULATORY_CARE_PROVIDER_SITE_OTHER): Payer: Medicare HMO | Admitting: *Deleted

## 2019-10-25 ENCOUNTER — Other Ambulatory Visit: Payer: Self-pay

## 2019-10-25 DIAGNOSIS — M810 Age-related osteoporosis without current pathological fracture: Secondary | ICD-10-CM

## 2019-10-25 MED ORDER — DENOSUMAB 60 MG/ML ~~LOC~~ SOSY
60.0000 mg | PREFILLED_SYRINGE | Freq: Once | SUBCUTANEOUS | Status: AC
  Administered 2019-10-25: 60 mg via SUBCUTANEOUS

## 2019-10-25 NOTE — Progress Notes (Signed)
Medical treatment/procedure(s) were performed by non-physician practitioner and as supervising physician I was immediately available for consultation/collaboration. I agree with above. Paullette Mckain A Killian Ress, MD  

## 2019-10-25 NOTE — Progress Notes (Signed)
Pls cosign for prolia since PCP is out of the office today../lmb 

## 2019-10-31 ENCOUNTER — Other Ambulatory Visit: Payer: Self-pay | Admitting: Internal Medicine

## 2019-10-31 NOTE — Telephone Encounter (Signed)
Done erx 

## 2019-11-12 ENCOUNTER — Other Ambulatory Visit: Payer: Self-pay

## 2019-11-12 ENCOUNTER — Ambulatory Visit (INDEPENDENT_AMBULATORY_CARE_PROVIDER_SITE_OTHER): Payer: Medicare HMO | Admitting: Internal Medicine

## 2019-11-12 ENCOUNTER — Ambulatory Visit (INDEPENDENT_AMBULATORY_CARE_PROVIDER_SITE_OTHER): Payer: Medicare HMO

## 2019-11-12 VITALS — BP 146/82 | HR 90 | Temp 98.3°F

## 2019-11-12 DIAGNOSIS — M25561 Pain in right knee: Secondary | ICD-10-CM | POA: Diagnosis not present

## 2019-11-12 DIAGNOSIS — M7989 Other specified soft tissue disorders: Secondary | ICD-10-CM | POA: Diagnosis not present

## 2019-11-12 DIAGNOSIS — M79661 Pain in right lower leg: Secondary | ICD-10-CM | POA: Diagnosis not present

## 2019-11-12 DIAGNOSIS — R269 Unspecified abnormalities of gait and mobility: Secondary | ICD-10-CM | POA: Diagnosis not present

## 2019-11-12 MED ORDER — HYDROCODONE-ACETAMINOPHEN 5-325 MG PO TABS: 1.0000 | ORAL_TABLET | Freq: Four times a day (QID) | ORAL | 0 refills | Status: DC | PRN

## 2019-11-12 NOTE — Progress Notes (Signed)
Subjective:    Patient ID: Teresa Riley, female    DOB: 11/19/1934, 85 y.o.   MRN: [id number]  HPI  Here with sudden onset right knee pain and swelling assoc with right calf pain and swelling x 3 days, no fever, trauma or giveaways or falls but pain 9/10.  Asks for [place].  Pt denies chest pain, increased sob or doe, wheezing, orthopnea, PND, increased LE swelling, palpitations, dizziness or syncope.  Pt denies new neurological symptoms such as new headache, or facial or extremity weakness or numbness   Pt denies polydipsia, polyuria,  Past Medical History:  Diagnosis Date  . Allergic rhinitis 09/05/2014  . Allergic rhinitis, cause unspecified 10/29/2010  . Chronic constipation 10/29/2010  . CKD (chronic kidney disease) stage 3, GFR 30-59 ml/min 04/28/2017  . Eczema 10/29/2010  . Gait difficulty   . H/O: hysterectomy 10/29/2010  . History of cervical cancer 10/29/2010  . HTN (hypertension) 10/29/2010  . Hyperlipidemia   . Hypertension   . Impaired glucose tolerance 08/16/2012  . Loss of appetite   . Memory loss   . Orthostatic hypotension 06/05/2012  . Osteoporosis   . Right hip pain   . Seizures ([place])   . Vascular dementia ([place])   . Vitamin D deficiency 09/05/2014   Past Surgical History:  Procedure Laterality Date  . ABDOMINAL HYSTERECTOMY  1970  . BREAST BIOPSY  1980's   benign  . CATARACT EXTRACTION W/ INTRAOCULAR LENS  IMPLANT, BILATERAL  01/2011  . EYE SURGERY      reports that she quit smoking about 48 years ago. Her smoking use included cigarettes. She has never used smokeless tobacco. She reports that she does not drink alcohol or use drugs. family history includes Diabetes in her father; High blood pressure in her daughter; Hypertension in her father and mother. No Known Allergies Current Outpatient Medications on File Prior to Visit  Medication Sig Dispense Refill  . acetaminophen (TYLENOL) 325 MG tablet Take 325-650 mg by mouth 2 (two) times daily as needed (eye pain).     Marland Kitchen albuterol (PROVENTIL) (2.5 MG/3ML) 0.083% nebulizer solution Take 3 mLs (2.5 mg total) by nebulization every 6 (six) hours as needed for wheezing or shortness of breath. 150 mL 1  . ALPRAZolam (XANAX) 0.25 MG tablet Take 1 tablet (0.25 mg total) by mouth 2 (two) times daily as needed. 60 tablet 2  . amLODipine (NORVASC) 10 MG tablet TAKE 1 TABLET(10 MG) BY MOUTH DAILY 90 tablet 3  . aspirin EC 81 MG tablet Take 81 mg by mouth daily.     Marland Kitchen atorvastatin (LIPITOR) 10 MG tablet Take 1 tablet (10 mg total) by mouth daily at 6 PM. 90 tablet 3  . Calcium-Magnesium-Vitamin D (CALCIUM MAGNESIUM PO) Take 1 tablet by mouth daily with lunch.     . citalopram (CELEXA) 10 MG tablet TAKE 1 TABLET(10 MG) BY MOUTH DAILY 90 tablet 3  . denosumab (PROLIA) 60 MG/ML SOSY injection Inject 60 mg into the skin every 6 (six) months.    . fluocinonide cream (LIDEX) 0.05 % Apply topically 2 (two) times daily. 30 g 1  . fluticasone (FLONASE) 50 MCG/ACT nasal spray Place 1 spray into both nostrils daily.    Marland Kitchen guaiFENesin (MUCINEX) 600 MG 12 hr tablet Take 2 tablets (1,200 mg total) by mouth 2 (two) times daily as needed. 120 tablet 1  . hydrocortisone cream 1 % Apply 1 application topically 2 (two) times daily as needed for itching (eczema/rash).    Marland Kitchen  labetalol (NORMODYNE) 300 MG tablet TAKE 1 TABLET(300 MG) BY MOUTH TWICE DAILY 180 tablet 3  . Liraglutide -Weight Management (SAXENDA) 18 MG/3ML SOPN Inject 0.1 mLs (0.6 mg total) into the skin once a week. 3 mL 3  . lisinopril (ZESTRIL) 40 MG tablet Take 1 tablet (40 mg total) by mouth daily. 90 tablet 3  . loratadine (CLARITIN) 10 MG tablet Take 10 mg by mouth daily as needed for allergies or rhinitis.     . Memantine HCl-Donepezil HCl (NAMZARIC) 28-10 MG CP24 Take 1 capsule by mouth at bedtime. 90 capsule 3  . Misc. Devices (CANE) MISC Use as directed daily; 1 each 0  . Omega-3 Fatty Acids (OMEGA 3 PO) Take 1 capsule by mouth daily with lunch.     Bertram Gala  Glycol-Propyl Glycol (SYSTANE OP) Place 1 drop into both eyes daily as needed (dry eyes).     . QUEtiapine (SEROQUEL) 50 MG tablet TAKE 1 TABLET(50 MG) BY MOUTH AT BEDTIME 90 tablet 3   No current facility-administered medications on file prior to visit.   Review of Systems All otherwise neg per pt     Objective:   Physical Exam BP (!) 146/82 (BP Location: Left Arm, Patient Position: Sitting, Cuff Size: Small)   Pulse 90   Temp 98.3 F (36.8 C) (Oral)   SpO2 96%  VS noted,  Constitutional: Pt appears in NAD HENT: Head: NCAT.  Right Ear: External ear normal.  Left Ear: External ear normal.  Eyes: . Pupils are equal, round, and reactive to light. Conjunctivae and EOM are normal Nose: without d/c or deformity Neck: Neck supple. Gross normal ROM Cardiovascular: Normal rate and regular rhythm.   Pulmonary/Chest: Effort normal and breath sounds without rales or wheezing.  Right knee with 1= effusion with calf swelling tender without cords and neg homans Neurological: Pt is alert. At baseline orientation, motor grossly intact Skin: Skin is warm. No rashes, other new lesions, no LE edema Psychiatric: Pt behavior is normal without agitation  All otherwise neg per pt Lab Results  Component Value Date   WBC 5.0 07/13/2018   HGB 12.6 07/13/2018   HCT 38.1 07/13/2018   PLT 234.0 07/13/2018   GLUCOSE 118 (H) 07/13/2018   CHOL 201 (H) 07/13/2018   TRIG 115.0 07/13/2018   HDL 76.20 07/13/2018   LDLDIRECT 93.0 09/16/2015   LDLCALC 102 (H) 07/13/2018   ALT 17 07/13/2018   AST 24 07/13/2018   NA 143 07/13/2018   K 4.0 07/13/2018   CL 109 07/13/2018   CREATININE 0.98 07/13/2018   BUN 19 07/13/2018   CO2 27 07/13/2018   TSH 0.75 07/13/2018   HGBA1C 5.6 01/16/2019          Assessment & Plan:

## 2019-11-12 NOTE — Patient Instructions (Signed)
Please take all new medication as prescribed - the pain medication  You will be contacted regarding the referral for: venous doppler right leg, and sports medicine appointment  You are given the rx for [place]  Please continue all other medications as before, and refills have been done if requested.  Please have the pharmacy call with any other refills you may need.  Please keep your appointments with your specialists as you may have planned

## 2019-11-14 ENCOUNTER — Encounter: Payer: Self-pay | Admitting: Internal Medicine

## 2019-11-14 NOTE — Assessment & Plan Note (Signed)
Ok for [place]

## 2019-11-14 NOTE — Assessment & Plan Note (Signed)
Suspect ruptured baker cyst; for hydrocodone prn, and sport med referral  I spent 31 minutes in preparing to see the patient by review of recent labs, imaging and procedures, obtaining and reviewing separately obtained history, communicating with the patient and family or caregiver, ordering medications, tests or procedures, and documenting clinical information in the EHR including the differential Dx, treatment, and any further evaluation and other management of right knee pain, right leg pain and swelling, and gait d/o

## 2019-11-14 NOTE — Assessment & Plan Note (Signed)
Cant r/o dvt - for LE venous doppler

## 2019-11-15 ENCOUNTER — Other Ambulatory Visit: Payer: Self-pay

## 2019-11-15 ENCOUNTER — Ambulatory Visit (HOSPITAL_COMMUNITY)
Admission: RE | Admit: 2019-11-15 | Discharge: 2019-11-15 | Disposition: A | Discharge status: Home / Self Care | Payer: Medicare HMO | Source: Ambulatory Visit | Attending: Cardiology | Admitting: Cardiology

## 2019-11-15 DIAGNOSIS — M7989 Other specified soft tissue disorders: Secondary | ICD-10-CM | POA: Insufficient documentation

## 2019-11-15 DIAGNOSIS — M79661 Pain in right lower leg: Secondary | ICD-10-CM | POA: Insufficient documentation

## 2019-11-15 DIAGNOSIS — M25561 Pain in right knee: Secondary | ICD-10-CM | POA: Diagnosis not present

## 2019-11-16 ENCOUNTER — Other Ambulatory Visit: Payer: Self-pay | Admitting: *Deleted

## 2019-11-16 ENCOUNTER — Ambulatory Visit (INDEPENDENT_AMBULATORY_CARE_PROVIDER_SITE_OTHER): Payer: Medicare HMO | Admitting: Family Medicine

## 2019-11-16 ENCOUNTER — Encounter: Payer: Self-pay | Admitting: Family Medicine

## 2019-11-16 ENCOUNTER — Ambulatory Visit: Payer: Self-pay

## 2019-11-16 ENCOUNTER — Other Ambulatory Visit (INDEPENDENT_AMBULATORY_CARE_PROVIDER_SITE_OTHER): Payer: Medicare HMO

## 2019-11-16 VITALS — BP 142/80 | HR 77 | Ht 64.0 in | Wt 186.6 lb

## 2019-11-16 DIAGNOSIS — E559 Vitamin D deficiency, unspecified: Secondary | ICD-10-CM | POA: Diagnosis not present

## 2019-11-16 DIAGNOSIS — E611 Iron deficiency: Secondary | ICD-10-CM

## 2019-11-16 DIAGNOSIS — Z0001 Encounter for general adult medical examination with abnormal findings: Secondary | ICD-10-CM | POA: Diagnosis not present

## 2019-11-16 DIAGNOSIS — M25561 Pain in right knee: Secondary | ICD-10-CM | POA: Diagnosis not present

## 2019-11-16 DIAGNOSIS — E538 Deficiency of other specified B group vitamins: Secondary | ICD-10-CM

## 2019-11-16 DIAGNOSIS — M25461 Effusion, right knee: Secondary | ICD-10-CM | POA: Diagnosis not present

## 2019-11-16 DIAGNOSIS — R739 Hyperglycemia, unspecified: Secondary | ICD-10-CM

## 2019-11-16 DIAGNOSIS — M7121 Synovial cyst of popliteal space [Baker], right knee: Secondary | ICD-10-CM

## 2019-11-16 LAB — CBC WITH DIFFERENTIAL/PLATELET
Basophils Absolute: 0 10*3/uL (ref 0.0–0.1)
Basophils Relative: 0.6 % (ref 0.0–3.0)
Eosinophils Absolute: 0.1 10*3/uL (ref 0.0–0.7)
Eosinophils Relative: 2.5 % (ref 0.0–5.0)
HCT: 38.2 % (ref 36.0–46.0)
Hemoglobin: 12.8 g/dL (ref 12.0–15.0)
Lymphocytes Relative: 44.9 % (ref 12.0–46.0)
Lymphs Abs: 2.5 10*3/uL (ref 0.7–4.0)
MCHC: 33.4 g/dL (ref 30.0–36.0)
MCV: 93.9 fl (ref 78.0–100.0)
Monocytes Absolute: 0.5 10*3/uL (ref 0.1–1.0)
Monocytes Relative: 8.5 % (ref 3.0–12.0)
Neutro Abs: 2.5 10*3/uL (ref 1.4–7.7)
Neutrophils Relative %: 43.5 % (ref 43.0–77.0)
Platelets: 251 10*3/uL (ref 150.0–400.0)
RBC: 4.06 Mil/uL (ref 3.87–5.11)
RDW: 13.8 % (ref 11.5–15.5)
WBC: 5.7 10*3/uL (ref 4.0–10.5)

## 2019-11-16 LAB — IBC PANEL
Iron: 76 ug/dL (ref 42–145)
Saturation Ratios: 21.2 % (ref 20.0–50.0)
Transferrin: 256 mg/dL (ref 212.0–360.0)

## 2019-11-16 LAB — VITAMIN D 25 HYDROXY (VIT D DEFICIENCY, FRACTURES): VITD: 46.98 ng/mL (ref 30.00–100.00)

## 2019-11-16 LAB — BASIC METABOLIC PANEL
BUN: 14 mg/dL (ref 6–23)
CO2: 30 mEq/L (ref 19–32)
Calcium: 9.6 mg/dL (ref 8.4–10.5)
Chloride: 104 mEq/L (ref 96–112)
Creatinine, Ser: 0.95 mg/dL (ref 0.40–1.20)
GFR: 68 mL/min (ref >60.00)
Glucose, Bld: 92 mg/dL (ref 70–99)
Potassium: 4 mEq/L (ref 3.5–5.1)
Sodium: 139 mEq/L (ref 135–145)

## 2019-11-16 LAB — HEPATIC FUNCTION PANEL
ALT: 11 U/L (ref 0–35)
AST: 22 U/L (ref 0–37)
Albumin: 4.2 g/dL (ref 3.5–5.2)
Alkaline Phosphatase: 113 U/L (ref 39–117)
Bilirubin, Direct: 0.1 mg/dL (ref 0.0–0.3)
Total Bilirubin: 0.6 mg/dL (ref 0.2–1.2)
Total Protein: 8.1 g/dL (ref 6.0–8.3)

## 2019-11-16 LAB — LIPID PANEL
Cholesterol: 180 mg/dL (ref 0–200)
HDL: 53.2 mg/dL (ref >39.00)
LDL Cholesterol: 98 mg/dL (ref 0–99)
NonHDL: 126.6
Total CHOL/HDL Ratio: 3
Triglycerides: 142 mg/dL (ref 0.0–149.0)
VLDL: 28.4 mg/dL (ref 0.0–40.0)

## 2019-11-16 LAB — HEMOGLOBIN A1C: Hgb A1c MFr Bld: 6.4 % (ref 4.6–6.5)

## 2019-11-16 LAB — VITAMIN B12: Vitamin B-12: 669 pg/mL (ref 211–911)

## 2019-11-16 LAB — TSH: TSH: 1.55 u[IU]/mL (ref 0.35–4.50)

## 2019-11-16 NOTE — Patient Instructions (Addendum)
Thank you for coming in today.  Use the voltaren gel up to 4x daily.  Use the compression sleeve on the knee during activity.  Ice is ok if feels good.  If not improving let me know.  We will get you back for an injection.    Baker Cyst  A Baker cyst, also called a popliteal cyst, is a growth that forms at the back of the knee. The cyst forms when the fluid-filled sac (bursa) that cushions the knee joint becomes enlarged. What are the causes? In most cases, a Baker cyst results from another knee problem that causes swelling inside the knee. This makes the fluid inside the knee joint (synovial fluid) flow into the bursa behind the knee, causing the bursa to enlarge. What increases the risk? You may be more likely to develop a Baker cyst if you already have a knee problem, such as:  A tear in cartilage that cushions the knee joint (meniscal tear).  A tear in the tissues that connect the bones of the knee joint (ligament tear).  Knee swelling from osteoarthritis, rheumatoid arthritis, or gout. What are the signs or symptoms? The main symptom of this condition is a lump behind the knee. This may be the only symptom of the condition. The lump may be painful, especially when the knee is straightened. If the lump is painful, the pain may come and go. The knee may also be stiff. Symptoms may quickly get more severe if the cyst breaks open (ruptures). If the cyst ruptures, you may feel the following in your knee and calf:  Sudden or worsening pain.  Swelling.  Bruising.  Redness in the calf. A Baker cyst does not always cause symptoms. How is this diagnosed? This condition may be diagnosed based on your symptoms and medical history. Your health care provider will also do a physical exam. This may include:  Feeling the cyst to check whether it is tender.  Checking your knee for signs of another knee condition that causes swelling. You may have imaging tests, such  as:  X-rays.  MRI.  Ultrasound. How is this treated? A Baker cyst that is not painful may go away without treatment. If the cyst gets large or painful, it will likely get better if the underlying knee problem is treated. If needed, treatment for a Baker cyst may include:  Resting.  Keeping weight off of the knee. This means not leaning on the knee to support your body weight.  Taking NSAIDs, such as ibuprofen, to reduce pain and swelling.  Having a procedure to drain the fluid from the cyst with a needle (aspiration). You may also get an injection of a medicine that reduces swelling (steroid).  Having surgery. This may be needed if other treatments do not work. This usually involves correcting knee damage and removing the cyst. Follow these instructions at home:  Activity  Rest as told by your health care provider.  Avoid activities that make pain or swelling worse.  Return to your normal activities as told by your health care provider. Ask your health care provider what activities are safe for you.  Do not use the injured limb to support your body weight until your health care provider says that you can. Use crutches as told by your health care provider. General instructions  Take over-the-counter and prescription medicines only as told by your health care provider.  Keep all follow-up visits as told by your health care provider. This is important. Contact a health  care provider if:  You have knee pain, stiffness, or swelling that does not get better. Get help right away if:  You have sudden or worsening pain and swelling in your calf area. Summary  A Baker cyst, also called a popliteal cyst, is a growth that forms at the back of the knee.  In most cases, a Baker cyst results from another knee problem that causes swelling inside the knee.  A Baker cyst that is not painful may go away without treatment.  If needed, treatment for a Baker cyst may include resting,  keeping weight off of the knee, medicines, or draining fluid from the cyst.  Surgery may be needed if other treatments are not effective. This information is not intended to replace advice given to you by your health care provider. Make sure you discuss any questions you have with your health care provider. Document Revised: 11/10/2018 Document Reviewed: 11/10/2018 Elsevier Patient Education  [place].

## 2019-11-16 NOTE — Progress Notes (Signed)
Subjective:    CC: R knee pain  I, Teresa Riley, LAT, ATC, am serving as scribe for Dr. Lynne Leader.  HPI: Pt is a 85 y/o female presenting w/ c/o R knee pain and swelling w/ associated R calf pain and swelling x 1-2 weeks .  She locates her pain to her R posterior knee.  She rates her pain as mild and describes her pain as "strained.".  Overall she think she is feeling better but still having a little bit of discomfort.  She is not tried much treatment yet.  Radiating pain: No R knee swelling: Unknown R knee mechanical symptoms: No Aggravating factors: walking and weight-bearing through R LE Treatments tried: Hydrocodone-acetaminophen; knee brace  Diagnostic imaging: R knee XR- 11/12/19; R LE venous doppler- 11/12/19  Pertinent review of Systems: No fevers or chills  Relevant historical information: History of seizure disorder.  Mild dementia.  CKD.   Objective:    Vitals:   11/16/19 1417  BP: (!) 142/80  Pulse: 77  SpO2: 96%   General: Well Developed, well nourished, and in no acute distress.   MSK: Right knee moderate effusion present otherwise normal. No particular tender to palpation. Range of motion 0-120 degrees. Stable ligamentous exam. Negative Murray's test. Intact strength.  Lab and Radiology Results No results found for this or any previous visit (from the past 72 hour(s)). DG Knee Complete 4 Views Right  Result Date: 11/13/2019 CLINICAL DATA:  Chronic RIGHT knee pain EXAM: RIGHT KNEE - COMPLETE 4+ VIEW COMPARISON:  None FINDINGS: Osseous demineralization. Minimal joint space narrowing. No fracture, dislocation or bone destruction. No joint effusion. IMPRESSION: Minimal degenerative changes without acute abnormalities. Electronically Signed   By: Lavonia Dana M.D.   On: 11/13/2019 08:24   VAS [place] LOWER EXTREMITY VENOUS (DVT)  Result Date: 11/15/2019  Lower Venous DVTStudy Indications: Pain and swelling of the right knee with calf pain, x 3 days.  Risk  Factors: None identified. Performing Technologist: Mariane Masters RVT  Examination Guidelines: A complete evaluation includes B-mode imaging, spectral Doppler, color Doppler, and power Doppler as needed of all accessible portions of each vessel. Bilateral testing is considered an integral part of a complete examination. Limited examinations for reoccurring indications may be performed as noted. The reflux portion of the exam is performed with the patient in reverse Trendelenburg.  +---------+---------------+---------+-----------+----------+--------------+ RIGHT    CompressibilityPhasicitySpontaneityPropertiesThrombus Aging +---------+---------------+---------+-----------+----------+--------------+ CFV      Full           Yes      Yes                                 +---------+---------------+---------+-----------+----------+--------------+ SFJ      Full           Yes      Yes                                 +---------+---------------+---------+-----------+----------+--------------+ FV Prox  Full           Yes      Yes                                 +---------+---------------+---------+-----------+----------+--------------+ FV Mid   Full           Yes  Yes                                 +---------+---------------+---------+-----------+----------+--------------+ FV DistalFull           Yes      Yes                                 +---------+---------------+---------+-----------+----------+--------------+ PFV      Full                                                        +---------+---------------+---------+-----------+----------+--------------+ POP      Full           Yes      Yes                                 +---------+---------------+---------+-----------+----------+--------------+ PTV      Full           Yes      Yes                                 +---------+---------------+---------+-----------+----------+--------------+ PERO     Full            Yes      Yes                                 +---------+---------------+---------+-----------+----------+--------------+ Gastroc  Full                                                        +---------+---------------+---------+-----------+----------+--------------+ GSV      Full           Yes      Yes                                 +---------+---------------+---------+-----------+----------+--------------+   Right Technical Findings: A non-vascular, cystic structure is visualized in the right popliteal fossa, consistent with Baker's cyst.  +----+---------------+---------+-----------+----------+--------------+ LEFTCompressibilityPhasicitySpontaneityPropertiesThrombus Aging +----+---------------+---------+-----------+----------+--------------+ CFV Full           Yes      Yes                                 +----+---------------+---------+-----------+----------+--------------+    Findings reported to Dr. Jonny Ruiz at 4:30 pm via staff message.  Summary: RIGHT: - No evidence of deep vein thrombosis in the lower extremity. No indirect evidence of obstruction proximal to the inguinal ligament. - A cystic structure is found in the popliteal fossa.  LEFT: - No evidence of common femoral vein obstruction.  *See table(s) above for measurements and observations. Electronically signed by Lance Muss MD on 11/15/2019 at 10:27:45 PM.    Final    I, Teresa Riley,  personally (independently) visualized and performed the interpretation of the x-ray images attached in this note.  Diagnostic Limited MSK Ultrasound of: Right knee Quad tendon intact normal-appearing moderate joint effusion present superior patellar space. Patellar tendon normal-appearing Lateral joint line narrowed with no visible meniscus tear. Medial joint line mildly degenerative without obvious meniscus tear. Posterior medial knee reveals a small Baker's cyst measuring 2.5 x 0.5 cm. Normal bony structures  otherwise Impression: Joint effusion mild DJD small Baker's cyst.   Impression and Recommendations:    Assessment and Plan: 85 y.o. female with right knee pain.  Pain thought to be due to Baker's cyst.  Patient likely had a ruptured Baker's cyst causing her calf pain and swelling.  Fortunately no DVT was seen on vascular ultrasound.  Overall her symptoms are improving and ultrasound examination and recent x-ray do not show severe change.  Her Baker's cyst is quite small typically smaller than would be helpful to attempt to aspirate.  We discussed options.  She would like to avoid injection if possible.  We will have trial Voltaren gel and emphasized use of compressive knee sleeve.  Continue activity as tolerated.  Recheck back in a few weeks especially if not improved.  Would consider injection at that point.Marland Kitchen  PDMP not reviewed this encounter. Orders Placed This Encounter  Procedures  . [place] LIMITED JOINT SPACE STRUCTURES LOW RIGHT(NO LINKED CHARGES)    Order Specific Question:   Reason for Exam (SYMPTOM  OR DIAGNOSIS REQUIRED)    Answer:   R knee pain    Order Specific Question:   Preferred imaging location?    Answer:   [facility]   No orders of the defined types were placed in this encounter.   Discussed warning signs or symptoms. Please see discharge instructions. Patient expresses understanding.   The above documentation has been reviewed and is accurate and complete Teresa Riley

## 2019-11-26 ENCOUNTER — Encounter: Payer: Self-pay | Admitting: Family Medicine

## 2019-11-26 ENCOUNTER — Other Ambulatory Visit (INDEPENDENT_AMBULATORY_CARE_PROVIDER_SITE_OTHER): Payer: Medicare HMO

## 2019-11-26 ENCOUNTER — Other Ambulatory Visit: Payer: Self-pay

## 2019-11-26 ENCOUNTER — Ambulatory Visit: Payer: Self-pay

## 2019-11-26 ENCOUNTER — Ambulatory Visit (INDEPENDENT_AMBULATORY_CARE_PROVIDER_SITE_OTHER): Payer: Medicare HMO | Admitting: Family Medicine

## 2019-11-26 VITALS — BP 150/90 | HR 73 | Ht 64.0 in | Wt 186.0 lb

## 2019-11-26 DIAGNOSIS — M7121 Synovial cyst of popliteal space [Baker], right knee: Secondary | ICD-10-CM

## 2019-11-26 DIAGNOSIS — M25461 Effusion, right knee: Secondary | ICD-10-CM

## 2019-11-26 DIAGNOSIS — Z0001 Encounter for general adult medical examination with abnormal findings: Secondary | ICD-10-CM

## 2019-11-26 NOTE — Patient Instructions (Addendum)
Thank you for coming in today. Continue the compression.  Recheck with me if this come back.  Call or go to the ER if you develop a large red swollen joint with extreme pain or oozing puss.

## 2019-11-26 NOTE — Progress Notes (Signed)
Rito Ehrlich, am serving as a Education administrator for Dr. Lynne Leader.  Teresa Riley is a 85 y.o. female who presents to [place] [facility] today for follow up of R knee pain. Patient was last seen by Dr. Georgina Snell on 11/16/2019 where she was thought to have a Baker's cyst that spontaneously drained into the thigh.  At that time she wanted to avoid trial of steroid injection and was recommended to use Voltaren gel and compressive knee sleeve.  She notes over the last week and a half she has had worsening pain and swelling at the posterior knee and difficulty walking.  She is willing to proceed with injection today if needed.   Pertinent review of systems: No fever or chills  Relevant historical information: Dementia.  History of seizures.   Exam:  BP (!) 150/90 (BP Location: Left Arm, Patient Position: Sitting, Cuff Size: Normal)   Pulse 73   Ht 5\' 4"  (1.626 m)   Wt 186 lb (84.4 kg)   SpO2 94%   BMI 31.93 kg/m  General: Well Developed, well nourished, and in no acute distress.   MSK: Right knee moderate effusion.  Swollen mildly tender posterior medial knee. Range of motion 5-100 degrees. Intact strength flexion extension.   Lab and Radiology Results  Diagnostic Limited MSK Ultrasound of: Right posterior medial knee Large Baker's cyst present measuring 2.3 x 2.5 cm Impression: Baker cyst   Procedure: Real-time Ultrasound Guided aspiration and injection of right knee Baker's cyst Device: Philips Affiniti 50G Images permanently stored and available for review in the ultrasound unit. Verbal informed consent obtained.  Discussed risks and benefits of procedure. Warned about infection bleeding damage to structures skin hypopigmentation and fat atrophy among others. Patient expresses understanding and agreement Time-out conducted.   Noted no overlying erythema, induration, or other signs of local infection.   Skin prepped in a sterile fashion.   Local anesthesia:  Topical Ethyl chloride.   With sterile technique and under real time ultrasound guidance:  3 mL of lidocaine injected subcutaneously and along trend aspiration tract. And was again sterilized with isopropyl alcohol 18-gauge needle was used to access the cyst. 10 mL of slightly cloudy straw-colored fluid aspirated. Syringe exchanged and 40 mg of Kenalog and 1 mL lidocaine injected Cyst was confirmed to be decompressed following aspiration performed injection with ultrasound. Completed without difficulty   Pain immediately resolved suggesting accurate placement of the medication.   Advised to call if fevers/chills, erythema, induration, drainage, or persistent bleeding.   Images permanently stored and available for review in the ultrasound unit.  Impression: Technically successful ultrasound guided injection.       Assessment and Plan: 85 y.o. female with right knee Baker's cyst.  Baker's cyst recurred with trial of conservative management.  Plan for aspiration and injection today.  Continue compressive sleeve and recheck back as needed.  Given cyst recurred very slightly cloudy we will go ahead and send for cell count crystal analysis and differential and culture.   PDMP not reviewed this encounter. Orders Placed This Encounter  Procedures  . Anaerobic and Aerobic Culture    Standing Status:   Future    Number of Occurrences:   1    Standing Expiration Date:   11/25/2020  . [place] LIMITED JOINT SPACE STRUCTURES LOW RIGHT    Standing Status:   Future    Number of Occurrences:   1    Standing Expiration Date:   01/25/2021  Order Specific Question:   Reason for Exam (SYMPTOM  OR DIAGNOSIS REQUIRED)    Answer:   right knee pain    Order Specific Question:   Preferred imaging location?    Answer:   [facility]  . Synovial cell count + diff, w/ crystals    Standing Status:   Future    Number of Occurrences:   1    Standing Expiration Date:   11/25/2020   No  orders of the defined types were placed in this encounter.    Discussed warning signs or symptoms. Please see discharge instructions. Patient expresses understanding.   The above documentation has been reviewed and is accurate and complete [name], M.D.

## 2019-11-27 LAB — URINALYSIS, ROUTINE W REFLEX MICROSCOPIC
Bilirubin Urine: NEGATIVE
Ketones, ur: NEGATIVE
Leukocytes,Ua: NEGATIVE
Nitrite: NEGATIVE
RBC / HPF: NONE SEEN (ref 0–?)
Specific Gravity, Urine: 1.02 (ref 1.000–1.030)
Total Protein, Urine: NEGATIVE
Urine Glucose: NEGATIVE
Urobilinogen, UA: 0.2 (ref 0.0–1.0)
pH: 7 (ref 5.0–8.0)

## 2019-11-27 NOTE — Progress Notes (Signed)
Cell count differential does not show gout or evidence of infection.Preliminary culture is negative however this will be a few days before is finalized.  Doubtful will see an infection here.

## 2019-11-29 NOTE — Progress Notes (Signed)
Culture is still negative.  Not finalized yet.

## 2019-12-02 LAB — SYNOVIAL CELL COUNT + DIFF, W/ CRYSTALS
Basophils, %: 0 %
Eosinophils-Synovial: 0 % (ref 0–2)
Lymphocytes-Synovial Fld: 36 % (ref 0–74)
Monocyte/Macrophage: 45 % (ref 0–69)
Neutrophil, Synovial: 19 % (ref 0–24)
Synoviocytes, %: 0 % (ref 0–15)
WBC, Synovial: 432 cells/uL — ABNORMAL HIGH (ref <150)

## 2019-12-02 LAB — ANAEROBIC AND AEROBIC CULTURE
AER RESULT:: NO GROWTH
MICRO NUMBER:: 10485395
MICRO NUMBER:: 10485396
SPECIMEN QUALITY:: ADEQUATE
SPECIMEN QUALITY:: ADEQUATE

## 2019-12-03 NOTE — Progress Notes (Signed)
Culture is negative and final

## 2019-12-06 ENCOUNTER — Encounter: Payer: Self-pay | Admitting: Family Medicine

## 2019-12-06 ENCOUNTER — Ambulatory Visit (INDEPENDENT_AMBULATORY_CARE_PROVIDER_SITE_OTHER): Payer: Medicare HMO | Admitting: Family Medicine

## 2019-12-06 VITALS — BP 158/88 | HR 69 | Ht 64.0 in | Wt 179.8 lb

## 2019-12-06 DIAGNOSIS — M7121 Synovial cyst of popliteal space [Baker], right knee: Secondary | ICD-10-CM

## 2019-12-06 DIAGNOSIS — M25461 Effusion, right knee: Secondary | ICD-10-CM

## 2019-12-06 NOTE — Progress Notes (Signed)
   I, Christoper Fabian, LAT, ATC, am serving as scribe for Dr. [name].  Teresa Riley is a 85 y.o. female who presents to [facility] at [facility] today for f/u of R knee pain and swelling.  She was last seen by Dr. Denyse Amass on 11/26/19 and had a R knee aspiration and injection.  She was advised to use a compressive knee sleeve and Voltaren gel.  Since her last visit, pt reports that her R knee pain is slowly improving.  She states that her R knee swelling has improved since her last visit.  She locates her R knee pain to her R anterior knee.  Diagnostic imaging: R knee XR- 11/12/19   Pertinent review of systems: No fevers or chills  Relevant historical information: Mild dementia   Exam:  BP (!) 158/88 (BP Location: Left Arm, Patient Position: Sitting, Cuff Size: Normal)   Pulse 69   Ht 5\' 4"  (1.626 m)   Wt 179 lb 12.8 oz (81.6 kg)   SpO2 96%   BMI 30.86 kg/m  General: Well Developed, well nourished, and in no acute distress.   MSK: Right knee mild effusion present otherwise normal-appearing No particular tender palpation. Stable ligaments exam. Intact strength.    Lab and Radiology Results  Diagnostic Limited MSK Ultrasound of: Right knee Quad tendon intact normal-appearing Moderate joint effusion present. Patellar tendon normal-appearing Small Baker's cyst present. Impression: Joint effusion with small Baker's cyst  Procedure: Real-time Ultrasound Guided Injection of the right knee Device: Philips Affiniti 50G Images permanently stored and available for review in the ultrasound unit. Verbal informed consent obtained.  Discussed risks and benefits of procedure. Warned about infection bleeding damage to structures skin hypopigmentation and fat atrophy among others. Patient expresses understanding and agreement Time-out conducted.   Noted no overlying erythema, induration, or other signs of local infection.   Skin prepped in a sterile fashion.   Local  anesthesia: Topical Ethyl chloride.   With sterile technique and under real time ultrasound guidance:  4 mg of Kenalog and 2 mL of Marcaine injected easily.   Completed without difficulty   Pain immediately resolved suggesting accurate placement of the medication.   Advised to call if fevers/chills, erythema, induration, drainage, or persistent bleeding.   Images permanently stored and available for review in the ultrasound unit.  Impression: Technically successful ultrasound guided injection.        Assessment and Plan: 85 y.o. female with right knee pain.  Patient has developed an effusion now which is probably the main thing driving her Baker's cyst.  This is likely the cause of her pain.  Hopeful that with steroid injection in the actual knee joint itself she may have better pain control.  Plan with steroid injection as above and recheck if not improving.  Next step is probably MRI for surgical planning.   PDMP not reviewed this encounter. No orders of the defined types were placed in this encounter.  No orders of the defined types were placed in this encounter.    Discussed warning signs or symptoms. Please see discharge instructions. Patient expresses understanding.   The above documentation has been reviewed and is accurate and complete 97, M.D.

## 2019-12-12 ENCOUNTER — Telehealth: Payer: Self-pay | Admitting: Family Medicine

## 2019-12-12 NOTE — Telephone Encounter (Signed)
Patient's daughter called stating that the patient was here on Thursday, 12/06/2019 and had a Cortizone injection and some fluid drained from her knee. Since then, the fluid and swelling has returned. The pain has gotten better but she is still not able to put pressure on her knee. Any advice on what she should do?

## 2019-12-13 NOTE — Telephone Encounter (Signed)
Left message for patient's daughter to call back to schedule appointment with Dr Denyse Amass.

## 2019-12-13 NOTE — Telephone Encounter (Signed)
Recommend schedule follow-up with me in the near future.

## 2019-12-20 ENCOUNTER — Other Ambulatory Visit: Payer: Self-pay

## 2019-12-20 ENCOUNTER — Ambulatory Visit: Payer: Self-pay

## 2019-12-20 ENCOUNTER — Ambulatory Visit (INDEPENDENT_AMBULATORY_CARE_PROVIDER_SITE_OTHER): Payer: Medicare HMO | Admitting: Family Medicine

## 2019-12-20 ENCOUNTER — Encounter: Payer: Self-pay | Admitting: Family Medicine

## 2019-12-20 VITALS — BP 162/90 | HR 78 | Ht 64.0 in | Wt 181.0 lb

## 2019-12-20 DIAGNOSIS — M25461 Effusion, right knee: Secondary | ICD-10-CM

## 2019-12-20 DIAGNOSIS — M7121 Synovial cyst of popliteal space [Baker], right knee: Secondary | ICD-10-CM

## 2019-12-20 NOTE — Progress Notes (Signed)
Wynema Birch, am serving as a Neurosurgeon for Dr. [name].  AHSLEY ATTWOOD is a 85 y.o. female who presents to [facility] at [facility] today for R knee pain. Patient was last seen by Dr. Denyse Amass on 12/06/2019 and stated R knee pain is slowly improving.  She states that her R knee swelling has improved since her last visit.  She locates her R knee pain to her R anterior knee. Since last visit patient reports still seeing improvement would like to see if she can get another US of the knee as there is still some swelling.  They would be willing to consider surgery to address her knee effusion and pain if needed.   Pertinent review of systems: No fevers or chills  Relevant historical information: Mild dementia   Exam:  BP (!) 162/90 (BP Location: Left Arm, Patient Position: Sitting, Cuff Size: Normal)   Pulse 78   Ht 5\' 4"  (1.626 m)   Wt 181 lb (82.1 kg)   SpO2 96%   BMI 31.07 kg/m  General: Well Developed, well nourished, and in no acute distress.   MSK: Right knee mild effusion otherwise normal-appearing Nontender normal motion.    Lab and Radiology Results Diagnostic Limited MSK Ultrasound of: Right knee Quad tendon intact.  Small joint effusion present. Posterior knee reveals a small Baker's cyst. Impression: Small to moderate joint effusion with small Baker's cyst.     Assessment and Plan: 85 y.o. female with persistent joint effusion and Baker's cyst despite aspiration and steroid injection several times.  Patient only had minimal arthritis/degenerative changes on x-ray in May.  Joint aspirate previously obtained did not show infection or gout crystals.  At this point I am suspicious that she has a degenerative meniscus tear this probably driving this effusion.  After discussion with patient and family will proceed with MRI for potential surgical planning.   PDMP not reviewed this encounter. Orders Placed This Encounter  Procedures  . June LIMITED  JOINT SPACE STRUCTURES LOW RIGHT    Standing Status:   Future    Number of Occurrences:   1    Standing Expiration Date:   12/19/2020    Order Specific Question:   Reason for Exam (SYMPTOM  OR DIAGNOSIS REQUIRED)    Answer:   Right knee pain    Order Specific Question:   Preferred imaging location?    Answer:   02/18/2021 [facility]  . MR Knee Right Wo Contrast    Standing Status:   Future    Standing Expiration Date:   12/19/2020    Order Specific Question:   ** REASON FOR EXAM (FREE TEXT)    Answer:   eval knee pain and swelling and baker cyst    Order Specific Question:   What is the patient's sedation requirement?    Answer:   No Sedation    Order Specific Question:   Does the patient have a pacemaker or implanted devices?    Answer:   No    Order Specific Question:   Preferred imaging location?    Answer:   02/18/2021 (table limit-350lbs)    Order Specific Question:   Radiology Contrast Protocol - do NOT remove file path    Answer:   \\[email or web address]   No orders of the defined types were placed in this encounter.    Discussed warning signs or symptoms. Please see discharge instructions. Patient expresses understanding.   The above documentation has been reviewed  and is accurate and complete Lynne Leader, M.D.

## 2019-12-20 NOTE — Patient Instructions (Addendum)
Plan for MRI.  Recheck with me following MRI to go over the results.  You should hear soon about scheduling.  Phone number to MRI at [place] is [phone]

## 2019-12-22 ENCOUNTER — Other Ambulatory Visit: Payer: Self-pay

## 2019-12-22 ENCOUNTER — Ambulatory Visit (INDEPENDENT_AMBULATORY_CARE_PROVIDER_SITE_OTHER): Payer: Medicare HMO

## 2019-12-22 DIAGNOSIS — M7121 Synovial cyst of popliteal space [Baker], right knee: Secondary | ICD-10-CM

## 2019-12-22 DIAGNOSIS — M25461 Effusion, right knee: Secondary | ICD-10-CM

## 2019-12-22 DIAGNOSIS — M25561 Pain in right knee: Secondary | ICD-10-CM | POA: Diagnosis not present

## 2019-12-24 NOTE — Progress Notes (Signed)
Right shows a large meniscus tear likely were concerned about.  However it does show a small fracture of the end of the femur that was not visible on x-ray.  Fracture is an insufficiency fracture which is probably caused by osteoporosis.  This is also causing some swelling in the knee.  Arthritis is also present.Please schedule follow-up appointment with me in the near future to review MRI findings and discuss what our next steps are going to be.

## 2019-12-27 ENCOUNTER — Ambulatory Visit: Payer: Medicare HMO | Admitting: Family Medicine

## 2019-12-28 ENCOUNTER — Other Ambulatory Visit: Payer: Self-pay

## 2019-12-28 ENCOUNTER — Encounter: Payer: Self-pay | Admitting: Family Medicine

## 2019-12-28 ENCOUNTER — Ambulatory Visit (INDEPENDENT_AMBULATORY_CARE_PROVIDER_SITE_OTHER): Payer: Medicare HMO | Admitting: Family Medicine

## 2019-12-28 VITALS — BP 150/82 | HR 71 | Ht 64.0 in | Wt 178.4 lb

## 2019-12-28 DIAGNOSIS — M25561 Pain in right knee: Secondary | ICD-10-CM | POA: Diagnosis not present

## 2019-12-28 DIAGNOSIS — M84453A Pathological fracture, unspecified femur, initial encounter for fracture: Secondary | ICD-10-CM

## 2019-12-28 NOTE — Patient Instructions (Signed)
Thank you for coming in today. Plan for consultation with orthopedic surgery about total knee replacement.  I think you will have a better outcome with surgery than with non-weight bearing of your knee.    Total Knee Replacement  Total knee replacement is a procedure to replace the damaged knee joint with an artificial (prosthetic) knee joint. The purpose of this surgery is to reduce knee pain and improve knee function. The prosthetic knee joint (prosthesis) may be made of metal, plastic, or ceramic. It replaces parts of the thigh bone (femur), lower leg bone (tibia), and kneecap (patella) that are removed during the procedure. Tell a health care provider about:  Any allergies you have.  All medicines you are taking, including vitamins, herbs, eye drops, creams, and over-the-counter medicines.  Any problems you or family members have had with anesthetic medicines.  Any blood disorders you have.  Any surgeries you have had.  Any medical conditions you have.  Whether you are pregnant or may be pregnant. What are the risks? Generally, this is a safe procedure. However, problems may occur, including:  Infection.  Bleeding.  Blood clot.  Allergic reactions to medicines.  Damage to nerves or other structures.  Decreased range of motion of the knee.  Instability of the knee.  Loosening of the prosthetic joint.  Knee pain that does not go away (chronic pain). What happens before the procedure? Staying hydrated Follow instructions from your health care provider about hydration, which may include:  Up to 2 hours before the procedure - you may continue to drink clear liquids, such as water, clear fruit juice, black coffee, and plain tea.  Eating and drinking restrictions Follow instructions from your health care provider about eating and drinking, which may include:  8 hours before the procedure - stop eating heavy meals or foods, such as meat, fried foods, or fatty  foods.  6 hours before the procedure - stop eating light meals or foods, such as toast or cereal.  6 hours before the procedure - stop drinking milk or drinks that contain milk.  2 hours before the procedure - stop drinking clear liquids. Medicines Ask your health care provider about:  Changing or stopping your regular medicines. This is especially important if you are taking diabetes medicines or blood thinners.  Taking medicines such as aspirin and ibuprofen. These medicines can thin your blood. Do not take these medicines unless your health care provider tells you to take them.  Taking over-the-counter medicines, vitamins, herbs, and supplements. Tests and exams  You may have a physical exam.  You may have tests, such as: ? X-rays. ? MRI. ? CT scan. ? Bone scans.  You may have a blood or urine sample taken. Lifestyle   If your health care provider prescribes physical therapy, do exercises as instructed.  Maintain good body and oral hygiene. Germs from anywhere in your body can travel to your new joint and infect it. Tell your health care provider if you: ? Plan to have dental care and routine cleanings. ? Develop any skin infections.  If you are overweight, work with your health care provider to reach a safe weight. Extra weight can put pressure on your knee.  Do not use any products that contain nicotine or tobacco, such as cigarettes, e-cigarettes, and chewing tobacco. These can delay healing after surgery. If you need help quitting, ask your health care provider. General instructions  Plan to have someone take you home from the hospital or clinic.  Plan  to have a responsible adult care for you for at least 24 hours after you leave the hospital or clinic. This is important. It is recommended that you have someone to help care for you for at least 4-6 weeks after your procedure.  Do not shave your legs just before surgery. If hair removal is needed, it will be done in  the hospital.  Ask your health care provider how your surgical site will be marked or identified.  Ask your health care provider what steps will be taken to prevent infection. These may include: ? Removing hair at the surgery site. ? Washing skin with a germ-killing soap. What happens during the procedure?  An IV will be inserted into one of your veins.  You will be given one or more of the following: ? A medicine to help you relax (sedative). ? A medicine that is injected into an area of your body near the nerves to numb everything below the injection site (peripheral nerve block). ? A medicine that is injected into your spine to numb the area below and slightly above the injection site (spinal anesthetic). ? A medicine to make you fall asleep (general anesthetic).  An incision will be made in your knee.  Damaged cartilage and bone will be removed from your femur, tibia, and patella.  Parts of the prosthesis (liners) will be placed over the areas of bone and cartilage that were removed. A metal liner will be placed over your femur, and plastic liners will be placed over your tibia and the underside of your patella.  One or more small tubes (drains) may be placed near your incision to help drain extra fluid from your surgery site.  Your incision will be closed with stitches (sutures), skin glue, or adhesive strips.  A bandage (dressing) will be placed over your incision. The procedure may vary among health care providers and hospitals. What happens after the procedure?  Your blood pressure, heart rate, breathing rate, and blood oxygen level will be monitored until you leave the hospital or clinic.  You will be given medicines to help manage pain.  You may: ? Continue to receive fluids through an IV. ? Have fluid coming from one or more drains in your incision. ? Have to wear compression stockings. These stockings help to prevent blood clots and reduce swelling in your  legs. ? Be given a continuous passive motion machine to use. You will be shown how to use this machine.  You will be encouraged to move. A physical therapist will teach you how to use crutches or a Viswanathan. He or she will also teach you how to exercise at home.  Do not drive until your health care provider approves. Summary  Total knee replacement is a procedure to replace the knee joint with an artificial (prosthetic) knee joint.  Before the procedure, follow instructions from your health care provider about eating and drinking.  Plan to have someone take you home from the hospital or clinic. This information is not intended to replace advice given to you by your health care provider. Make sure you discuss any questions you have with your health care provider. Document Revised: 02/09/2018 Document Reviewed: 02/09/2018 Elsevier Patient Education  [employer].

## 2019-12-28 NOTE — Progress Notes (Signed)
I, Wendy Poet, LAT, ATC, am serving as scribe for Dr. Lynne Leader.  Teresa Riley is a 85 y.o. female who presents to [facility] today for f/u of R knee pain and R knee MRI review.  She was last seen by Dr. Georgina Snell on 12/20/19 and was referred for a R knee MRI.  She had a prior R knee aspiration and injection on 12/06/19 for a Baker's cyst.  Since her last visit, pt reports that her R knee is about the same.  She has been using her rolling Kiser.  Diagnostic imaging: R knee MRI- 12/22/19; R knee XR- 11/12/19  Pertinent review of systems: No fevers or chills  Relevant historical information: Hypertension, CKD, mild dementia.   Exam:  BP (!) 150/82 (BP Location: Right Arm, Patient Position: Sitting, Cuff Size: Normal)   Pulse 71   Ht 5\' 4"  (1.626 m)   Wt 178 lb 6.4 oz (80.9 kg)   SpO2 98%   BMI 30.62 kg/m  General: Well Developed, well nourished, and in no acute distress.   MSK: Right knee effusion mildly tender palpation medial joint line.    Lab and Radiology Results  EXAM: MRI OF THE RIGHT KNEE WITHOUT CONTRAST  TECHNIQUE: Multiplanar, multisequence MR imaging of the knee was performed. No intravenous contrast was administered.  COMPARISON:  None.  FINDINGS: MENISCI  Medial meniscus: Large complex tear of the posterior horn of the medial meniscus with a radial component and peripheral meniscal extrusion.  Lateral meniscus:  Intact. Degeneration of the lateral meniscus.  LIGAMENTS  Cruciates:  Intact ACL and PCL.  Collaterals: Intact lateral collateral ligament complex. Mild thickening of the proximal MCL with edema superficial to the MCL most concerning for MCL strain without a tear.  CARTILAGE  Patellofemoral: Partial-thickness cartilage loss with areas of high-grade partial-thickness cartilage loss of the lateral patellofemoral compartment.  Medial: High-grade partial-thickness cartilage loss of the  medial femorotibial compartment.  Lateral: Partial thickness cartilage loss of the lateral femorotibial compartment.  Joint: Moderate joint effusion. Mild edema in Hoffa's fat. No plical thickening.  Popliteal Fossa:  Small Baker's cyst. Intact popliteus tendon.  Extensor Mechanism: Intact quadriceps tendon. Intact patellar tendon. Intact medial patellar retinaculum. Intact lateral patellar retinaculum. Intact MPFL.  Bones: Subchondral linear low signal in the weight-bearing surface of the medial femoral condyle with severe surrounding marrow edema most concerning for a nondisplaced, nondepressed subchondral insufficiency fracture.  Other: No fluid collection or hematoma. Muscles are normal.  IMPRESSION: 1. Large complex tear of the posterior horn of the medial meniscus with a radial component and peripheral meniscal extrusion. 2. Nondisplaced, nondepressed subchondral insufficiency fracture of the medial femoral condyle with severe surrounding marrow edema. 3. Tricompartmental cartilage abnormalities as described above. 4. Moderate joint effusion. 5. Mild thickening of the proximal MCL with edema superficial to the MCL most concerning for MCL strain without a tear.   Electronically Signed   By: Kathreen Devoid   On: 12/23/2019 07:25  I, Lynne Leader, personally (independently) visualized and performed the interpretation of the images attached in this note.     Assessment and Plan: 85 y.o. female with right knee pain multifactorial.  Patient does have significant degenerative changes and a posterior medial meniscus tear.  She also has an insufficiency fracture at the medial femoral condyle with marrow edema.  She is definitely failing conservative management at this point.  She has daily pain and difficulty with weightbearing.  In an isolated situation total knee  replacement I think would be her best option however she has mild dementia.  I think it is worth at least  having a surgical consultation to discuss the pros and cons of total knee replacement.  I am concerned about her ability to truly do nonweightbearing to try to get the insufficiency fracture to heal.  I think that is going to be challenging for her and I am not optimistic that it will make a huge difference in her overall pain in her knee.  Plan for surgical consultation referral and recheck back with me as needed.  Discussed the case with the patient and her daughter.   PDMP not reviewed this encounter. Orders Placed This Encounter  Procedures  . Ambulatory referral to Orthopedic Surgery    Referral Priority:   Routine    Referral Type:   Surgical    Referral Reason:   Specialty Services Required    Requested Specialty:   Orthopedic Surgery    Number of Visits Requested:   1   No orders of the defined types were placed in this encounter.    Discussed warning signs or symptoms. Please see discharge instructions. Patient expresses understanding.   The above documentation has been reviewed and is accurate and complete [name], M.D.  Total encounter time 30 minutes including charting time date of service. MRI results plan and options.

## 2019-12-31 ENCOUNTER — Ambulatory Visit: Payer: Medicare HMO | Admitting: Family Medicine

## 2020-01-09 ENCOUNTER — Telehealth: Payer: Self-pay

## 2020-01-09 ENCOUNTER — Ambulatory Visit (INDEPENDENT_AMBULATORY_CARE_PROVIDER_SITE_OTHER): Payer: Medicare HMO | Admitting: Orthopedic Surgery

## 2020-01-09 VITALS — Ht 64.0 in | Wt 178.0 lb

## 2020-01-09 DIAGNOSIS — M1711 Unilateral primary osteoarthritis, right knee: Secondary | ICD-10-CM | POA: Diagnosis not present

## 2020-01-09 NOTE — Telephone Encounter (Signed)
Can we get auth for knee gel injection?

## 2020-01-10 NOTE — Telephone Encounter (Signed)
Submitted for VOB for Monovisc-Right knee 

## 2020-01-11 ENCOUNTER — Telehealth: Payer: Self-pay

## 2020-01-11 NOTE — Telephone Encounter (Signed)
Called and scheduled and pt is aware of copay

## 2020-01-11 NOTE — Telephone Encounter (Signed)
Approved for Monovisc-Right knee Buy and Bill Dr. August Saucer $50 copay No OOP Prior auth required with [employer] auth # [id number] Dates: 01/11/20-07/13/20

## 2020-01-12 ENCOUNTER — Encounter: Payer: Self-pay | Admitting: Orthopedic Surgery

## 2020-01-12 NOTE — Progress Notes (Signed)
Office Visit Note   Patient: Teresa Riley           Date of Birth: February 12, 1935           MRN: [id number] Visit Date: 01/09/2020 Requested by: Rodolph Bong, MD [address] [place],  [place] [address] PCP: Corwin Levins, MD  Subjective: Chief Complaint  Patient presents with  . Right Knee - Pain    HPI: Teresa Riley is a 85 year old patient with right knee pain.  Is been going on for 2 months.  She has had radiographs and MRI scan which are reviewed.  Patient states that the pain started out with a "Baker's cyst".  Reports global pain in the knee.  She has had aspiration and injection which gave her some relief.  She is using a Kennard now.  She was not using a Bumbaugh 3 months ago.  Tylenol does help her symptoms.  She lives with her daughters who are here today.  Patient does not describe any significant limitation of walking endurance.  In general she states that at the time of this clinic visit her symptoms are improving.  Pain does not wake her from sleep at night.  MRI scan of the right knee demonstrates meniscal tear which is not unexpected for patient in her age group along with high-grade loss of medial compartment cartilage and also significant patellofemoral arthritis.              ROS: All systems reviewed are negative as they relate to the chief complaint within the history of present illness.  Patient denies  fevers or chills.   Assessment & Plan: Visit Diagnoses:  1. Arthritis of right knee     Plan: Impression is right knee pain of 2 months duration with mildly improved response to injection but recurrence of pain.  In general Teresa Riley is getting better.  We discussed a lot of different treatment options today.  Discussed the advantages and disadvantages of total knee replacement.  Based on her recent relative improvement in symptoms I think trying a gel injection prior to any operative intervention is indicated.  We will get her preapproved for Synvisc shot try that and then see her  back about 6 weeks after the Synvisc injection.  Follow-Up Instructions: No follow-ups on file.   Orders:  No orders of the defined types were placed in this encounter.  No orders of the defined types were placed in this encounter.     Procedures: No procedures performed   Clinical Data: No additional findings.  Objective: Vital Signs: Ht 5\' 4"  (1.626 m)   Wt 178 lb (80.7 kg)   BMI 30.55 kg/m   Physical Exam:   Constitutional: Patient appears well-developed HEENT:  Head: Normocephalic Eyes:EOM are normal Neck: Normal range of motion Cardiovascular: Normal rate Pulmonary/chest: Effort normal Neurologic: Patient is alert Skin: Skin is warm Psychiatric: Patient has normal mood and affect    Ortho Exam: Ortho exam demonstrates full extension both legs.  She has flexion to about 120 bilaterally.  Collateral cruciate ligaments are stable.  Extensor mechanism is intact on the right.  No effusion in that right knee.  Medial greater than lateral joint line tenderness.  Mild patellofemoral crepitus is present bilaterally.  No other masses lymphadenopathy or skin changes noted in that right knee region particularly no palpable Baker's cyst is present.  Specialty Comments:  No specialty comments available.  Imaging: No results found.   PMFS History: Patient Active Problem List  Diagnosis Date Noted  . Right knee pain 11/12/2019  . Pain and swelling of right lower leg 11/12/2019  . Weight gain 10/17/2019  . Wheezing 10/17/2019  . Acute dysfunction of left eustachian tube 01/16/2019  . Hyperglycemia 01/16/2019  . CKD (chronic kidney disease) stage 3, GFR 30-59 ml/min 04/28/2017  . Hemorrhoids 04/28/2017  . Bilateral hearing loss 11/06/2016  . Headache 11/05/2016  . Thrush 11/05/2016  . Paresthesia 09/27/2016  . Vertigo 07/04/2016  . Urinary retention 06/29/2016  . Gait disorder 06/29/2016  . General weakness 09/16/2015  . Seizures (HCC) 08/27/2015  . Dry eyes  05/27/2015  . Dry skin 03/12/2015  . Rash and nonspecific skin eruption 03/12/2015  . Renal insufficiency 03/12/2015  . Esophageal dysmotility 01/15/2015  . Colon cancer screening - declined 01/15/2015  . Syncope and collapse 10/03/2014  . Constipation 09/05/2014  . Encounter for well adult exam with abnormal findings 09/05/2014  . Osteoporosis 09/05/2014  . Vitamin D deficiency 09/05/2014  . Allergic rhinitis 09/05/2014  . 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  . Insomnia 10/02/2013  . Weight loss 04/04/2013  . Localization-related focal epilepsy with simple partial seizures (HCC) 11/15/2012  . Dementia (HCC) 11/15/2012  . Hyperlipidemia 08/06/2012  . HTN (hypertension) 10/29/2010  . Eczema 10/29/2010  . History of cervical cancer 10/29/2010   Past Medical History:  Diagnosis Date  . Allergic rhinitis 09/05/2014  . Allergic rhinitis, cause unspecified 10/29/2010  . Chronic constipation 10/29/2010  . CKD (chronic kidney disease) stage 3, GFR 30-59 ml/min 04/28/2017  . Eczema 10/29/2010  . Gait difficulty   . H/O: hysterectomy 10/29/2010  . History of cervical cancer 10/29/2010  . HTN (hypertension) 10/29/2010  . Hyperlipidemia   . Hypertension   . Impaired glucose tolerance 08/16/2012  . Loss of appetite   . Memory loss   . Orthostatic hypotension 06/05/2012  . Osteoporosis   . Right hip pain   . Seizures (HCC)   . Vascular dementia (HCC)   . Vitamin D deficiency 09/05/2014    Family History  Problem Relation Age of Onset  . Hypertension Mother   . Hypertension Father   . Diabetes Father   . High blood pressure Daughter        x2  . Colon cancer Neg Hx   . Colon polyps Neg Hx   . Kidney disease Neg Hx   . Gallbladder disease Neg Hx     Past Surgical History:  Procedure Laterality Date  . ABDOMINAL HYSTERECTOMY  1970  . BREAST BIOPSY  1980's   benign  . CATARACT EXTRACTION W/ INTRAOCULAR LENS  IMPLANT, BILATERAL  01/2011  . EYE SURGERY     Social History   Occupational  History  . Occupation: retired Runner, broadcasting/film/video  Tobacco Use  . Smoking status: Former Smoker    Types: Cigarettes    Quit date: 07/13/1971    Years since quitting: 48.5  . Smokeless tobacco: Never Used  . Tobacco comment: quit in 1973  Vaping Use  . Vaping Use: Never used  Substance and Sexual Activity  . Alcohol use: No    Alcohol/week: 0.0 standard drinks  . Drug use: No  . Sexual activity: Never

## 2020-01-19 ENCOUNTER — Other Ambulatory Visit: Payer: Self-pay | Admitting: Internal Medicine

## 2020-01-19 NOTE — Telephone Encounter (Signed)
Ok for refill statin only per routine

## 2020-01-23 ENCOUNTER — Encounter: Payer: Self-pay | Admitting: Orthopedic Surgery

## 2020-01-23 ENCOUNTER — Ambulatory Visit: Payer: Medicare HMO | Admitting: Orthopedic Surgery

## 2020-01-23 DIAGNOSIS — M1711 Unilateral primary osteoarthritis, right knee: Secondary | ICD-10-CM

## 2020-01-26 ENCOUNTER — Encounter: Payer: Self-pay | Admitting: Orthopedic Surgery

## 2020-01-26 DIAGNOSIS — M1711 Unilateral primary osteoarthritis, right knee: Secondary | ICD-10-CM | POA: Diagnosis not present

## 2020-01-26 MED ORDER — HYALURONAN 88 MG/4ML IX SOSY
88.0000 mg | PREFILLED_SYRINGE | INTRA_ARTICULAR | Status: AC | PRN
  Administered 2020-01-26: 88 mg via INTRA_ARTICULAR

## 2020-01-26 MED ORDER — LIDOCAINE HCL 1 % IJ SOLN
5.0000 mL | INTRAMUSCULAR | Status: AC | PRN
  Administered 2020-01-26: 5 mL

## 2020-01-26 NOTE — Progress Notes (Signed)
   Procedure Note  Patient: Teresa Riley             Date of Birth: 1935/05/03           MRN: [id number]             Visit Date: 01/23/2020  Procedures: Visit Diagnoses:  1. Arthritis of right knee     Large Joint Inj: R knee on 01/26/2020 10:45 PM Indications: pain, joint swelling and diagnostic evaluation Details: 18 G 1.5 in needle, superolateral approach  Arthrogram: No  Medications: 5 mL lidocaine 1 %; 88 mg Hyaluronan 88 MG/4ML Outcome: tolerated well, no immediate complications Procedure, treatment alternatives, risks and benefits explained, specific risks discussed. Consent was given by the patient. Immediately prior to procedure a time out was called to verify the correct patient, procedure, equipment, support staff and site/side marked as required. Patient was prepped and draped in the usual sterile fashion.

## 2020-04-09 ENCOUNTER — Ambulatory Visit (INDEPENDENT_AMBULATORY_CARE_PROVIDER_SITE_OTHER): Payer: Medicare HMO | Admitting: Orthopedic Surgery

## 2020-04-09 ENCOUNTER — Encounter: Payer: Self-pay | Admitting: Orthopedic Surgery

## 2020-04-09 DIAGNOSIS — M1711 Unilateral primary osteoarthritis, right knee: Secondary | ICD-10-CM

## 2020-04-09 MED ORDER — METHYLPREDNISOLONE ACETATE 40 MG/ML IJ SUSP
40.0000 mg | INTRAMUSCULAR | Status: AC | PRN
  Administered 2020-04-09: 40 mg via INTRA_ARTICULAR

## 2020-04-09 MED ORDER — LIDOCAINE HCL 1 % IJ SOLN
5.0000 mL | INTRAMUSCULAR | Status: AC | PRN
  Administered 2020-04-09: 5 mL

## 2020-04-09 MED ORDER — BUPIVACAINE HCL 0.25 % IJ SOLN
4.0000 mL | INTRAMUSCULAR | Status: AC | PRN
  Administered 2020-04-09: 4 mL via INTRA_ARTICULAR

## 2020-04-09 NOTE — Progress Notes (Signed)
Office Visit Note   Patient: Teresa Riley           Date of Birth: [birth year]           MRN: [id number] Visit Date: 04/09/2020 Requested by: Corwin Levins, MD [address] [place],  [place] [address] PCP: Corwin Levins, MD  Subjective: Chief Complaint  Patient presents with  . Right Knee - Pain    HPI: Teresa Riley is an 85 year old patient with right knee pain.  She has medial compartment arthritis along with a meniscal tear.  Here with her daughter today.  Has a history of chronic kidney disease as well as early dementia.  Reviewed all of her medical history as well as MRI scans of the knee and ankle with Weda and primarily her daughter today.  Patient had gel injection 01/23/2020 did help for a month.  Cortisone injection helped but not as much and that was done at the end of June.  Patient uses a cane.  Hard for her to walk outside.  She does live on the first floor.  The pain does not wake her from sleep at night and in general Emory is not really reporting whole lot of pain but she does report some occasional sporadic catching in the knee.  She is also having some new balance issues.  Hard for her to walk out to the driveway.              ROS: All systems reviewed are negative as they relate to the chief complaint within the history of present illness.  Patient denies  fevers or chills.   Assessment & Plan: Visit Diagnoses:  1. Arthritis of right knee     Plan: Impression is right knee occasional sporadic catching symptoms as well as some mild pain but Teresa Riley is not really describing a lot of pain.  We aspirated and injected the knee again today.  That was with cortisone.  She is wondering about a total knee replacement but I do not think that is a great idea for somebody in her position.  Especially with new balance issues which are likely related to her progressive dementia total knee replacement is not a terrific idea.  Regarding knee arthroscopy that would also shot down the quad for  several weeks and potentially put her at risk for a fall.  She also has a meniscal tear but in addition some arthritis in that medial compartment.  Would favor nonoperative treatment if at all possible.  Do not recommend knee replacement at this time but whether or not arthroscopy would benefit her is in the gray zone.  I have not had great success with arthroscopy in older patients in her category.  Namiko and her daughter will discuss further options following this clinic visit.  Follow-Up Instructions: Return if symptoms worsen or fail to improve.   Orders:  No orders of the defined types were placed in this encounter.  No orders of the defined types were placed in this encounter.     Procedures: Large Joint Inj: R knee on 04/09/2020 1:13 PM Indications: diagnostic evaluation, joint swelling and pain Details: 18 G 1.5 in needle, superolateral approach  Arthrogram: No  Medications: 5 mL lidocaine 1 %; 40 mg methylPREDNISolone acetate 40 MG/ML; 4 mL bupivacaine 0.25 % Outcome: tolerated well, no immediate complications Procedure, treatment alternatives, risks and benefits explained, specific risks discussed. Consent was given by the patient. Immediately prior to procedure a time out was called to  verify the correct patient, procedure, equipment, support staff and site/side marked as required. Patient was prepped and draped in the usual sterile fashion.       Clinical Data: No additional findings.  Objective: Vital Signs: There were no vitals taken for this visit.  Physical Exam:   Constitutional: Patient appears well-developed HEENT:  Head: Normocephalic Eyes:EOM are normal Neck: Normal range of motion Cardiovascular: Normal rate Pulmonary/chest: Effort normal Neurologic: Patient is alert Skin: Skin is warm Psychiatric: Patient has normal mood and affect    Ortho Exam: Ortho exam demonstrates mild right knee effusion.  Extensor mechanism is intact.  Collateral cruciate  ligaments are stable.  Pedal pulses palpable.  No masses lymphadenopathy or skin changes noted in that right knee region.  She is able to walk reasonably well with a cane.  Specialty Comments:  No specialty comments available.  Imaging: No results found.   PMFS History: Patient Active Problem List   Diagnosis Date Noted  . Right knee pain 11/12/2019  . Pain and swelling of right lower leg 11/12/2019  . Weight gain 10/17/2019  . Wheezing 10/17/2019  . Acute dysfunction of left eustachian tube 01/16/2019  . Hyperglycemia 01/16/2019  . CKD (chronic kidney disease) stage 3, GFR 30-59 ml/min 04/28/2017  . Hemorrhoids 04/28/2017  . Bilateral hearing loss 11/06/2016  . Headache 11/05/2016  . Thrush 11/05/2016  . Paresthesia 09/27/2016  . Vertigo 07/04/2016  . Urinary retention 06/29/2016  . Gait disorder 06/29/2016  . General weakness 09/16/2015  . Seizures (HCC) 08/27/2015  . Dry eyes 05/27/2015  . Dry skin 03/12/2015  . Rash and nonspecific skin eruption 03/12/2015  . Renal insufficiency 03/12/2015  . Esophageal dysmotility 01/15/2015  . Colon cancer screening - declined 01/15/2015  . Syncope and collapse 10/03/2014  . Constipation 09/05/2014  . Encounter for well adult exam with abnormal findings 09/05/2014  . Osteoporosis 09/05/2014  . Vitamin D deficiency 09/05/2014  . Allergic rhinitis 09/05/2014  . 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  . Insomnia 10/02/2013  . Weight loss 04/04/2013  . Localization-related focal epilepsy with simple partial seizures (HCC) 11/15/2012  . Dementia (HCC) 11/15/2012  . Hyperlipidemia 08/06/2012  . HTN (hypertension) 10/29/2010  . Eczema 10/29/2010  . History of cervical cancer 10/29/2010   Past Medical History:  Diagnosis Date  . Allergic rhinitis 09/05/2014  . Allergic rhinitis, cause unspecified 10/29/2010  . Chronic constipation 10/29/2010  . CKD (chronic kidney disease) stage 3, GFR 30-59 ml/min 04/28/2017  . Eczema 10/29/2010  . Gait  difficulty   . H/O: hysterectomy 10/29/2010  . History of cervical cancer 10/29/2010  . HTN (hypertension) 10/29/2010  . Hyperlipidemia   . Hypertension   . Impaired glucose tolerance 08/16/2012  . Loss of appetite   . Memory loss   . Orthostatic hypotension 06/05/2012  . Osteoporosis   . Right hip pain   . Seizures (HCC)   . Vascular dementia (HCC)   . Vitamin D deficiency 09/05/2014    Family History  Problem Relation Age of Onset  . Hypertension Mother   . Hypertension Father   . Diabetes Father   . High blood pressure Daughter        x2  . Colon cancer Neg Hx   . Colon polyps Neg Hx   . Kidney disease Neg Hx   . Gallbladder disease Neg Hx     Past Surgical History:  Procedure Laterality Date  . ABDOMINAL HYSTERECTOMY  1970  . BREAST BIOPSY  1980's   benign  .  CATARACT EXTRACTION W/ INTRAOCULAR LENS  IMPLANT, BILATERAL  01/2011  . EYE SURGERY     Social History   Occupational History  . Occupation: retired Runner, broadcasting/film/video  Tobacco Use  . Smoking status: Former Smoker    Types: Cigarettes    Quit date: 07/13/1971    Years since quitting: 48.7  . Smokeless tobacco: Never Used  . Tobacco comment: quit in 1973  Vaping Use  . Vaping Use: Never used  Substance and Sexual Activity  . Alcohol use: No    Alcohol/week: 0.0 standard drinks  . Drug use: No  . Sexual activity: Never

## 2020-05-29 ENCOUNTER — Ambulatory Visit (INDEPENDENT_AMBULATORY_CARE_PROVIDER_SITE_OTHER): Payer: Medicare HMO | Admitting: Orthopedic Surgery

## 2020-05-29 ENCOUNTER — Telehealth: Payer: Self-pay

## 2020-05-29 DIAGNOSIS — M1711 Unilateral primary osteoarthritis, right knee: Secondary | ICD-10-CM

## 2020-05-29 NOTE — Telephone Encounter (Signed)
auth needed for right knee gel injection

## 2020-05-29 NOTE — Telephone Encounter (Signed)
Noted  

## 2020-06-02 ENCOUNTER — Telehealth: Payer: Self-pay

## 2020-06-02 NOTE — Telephone Encounter (Signed)
Submitted VOB, Monovisc, right knee. 

## 2020-06-10 ENCOUNTER — Telehealth: Payer: Self-pay

## 2020-06-10 NOTE — Telephone Encounter (Signed)
Approved, Monovisc, right knee. [facility] Once OOP has been met, patient is covered at 100%. Co-pay of $25.00 No PA required  Appt.06/12/2020 with Dr. Marlou Sa

## 2020-06-12 ENCOUNTER — Ambulatory Visit: Payer: Medicare HMO | Admitting: Orthopedic Surgery

## 2020-06-13 ENCOUNTER — Other Ambulatory Visit: Payer: Self-pay | Admitting: Internal Medicine

## 2020-06-13 NOTE — Telephone Encounter (Signed)
Please refill as per office routine med refill policy (all routine meds refilled for 3 mo or monthly per pt preference up to one year from last visit, then month to month grace period for 3 mo, then further med refills will have to be denied)  

## 2020-06-13 NOTE — Telephone Encounter (Signed)
Sorry no refill needed 

## 2020-06-17 ENCOUNTER — Encounter: Payer: Self-pay | Admitting: Orthopedic Surgery

## 2020-06-17 DIAGNOSIS — M1711 Unilateral primary osteoarthritis, right knee: Secondary | ICD-10-CM | POA: Diagnosis not present

## 2020-06-17 MED ORDER — METHYLPREDNISOLONE ACETATE 40 MG/ML IJ SUSP
40.0000 mg | INTRAMUSCULAR | Status: AC | PRN
  Administered 2020-06-17: 40 mg via INTRA_ARTICULAR

## 2020-06-17 MED ORDER — BUPIVACAINE HCL 0.25 % IJ SOLN
4.0000 mL | INTRAMUSCULAR | Status: AC | PRN
  Administered 2020-06-17: 4 mL via INTRA_ARTICULAR

## 2020-06-17 MED ORDER — LIDOCAINE HCL 1 % IJ SOLN
5.0000 mL | INTRAMUSCULAR | Status: AC | PRN
  Administered 2020-06-17: 5 mL

## 2020-06-17 NOTE — Progress Notes (Signed)
Office Visit Note   Patient: Teresa Riley           Date of Birth: 1935/02/27           MRN: [id number] Visit Date: 05/29/2020 Requested by: Teresa Levins, MD [address] [place],  [place] [address] PCP: Teresa Levins, MD  Subjective: Chief Complaint  Patient presents with  . Right Knee - Pain    HPI: Teresa Riley is an 85 year old patient with multiple medical problems who is here with her daughter today to evaluate for knee replacement versus continued conservative management. She does have dementia along with other medical problems. In general she has been managing reasonably well with injections. Denies any interval history of injury or trauma. Does report pain with weightbearing. Does use a Teresa Riley for ambulation.              ROS: All systems reviewed are negative as they relate to the chief complaint within the history of present illness.  Patient denies  fevers or chills.   Assessment & Plan: Visit Diagnoses:  1. Arthritis of right knee     Plan: Impression is right knee arthritis. Talked with the daughter at length today. In general I think Teresa Riley would have to have higher level of commitment to the rehabilitative process in order to have a successful outcome. Would be risky for somebody in her age group as well. After long discussion we will try an injection today and continue with nonoperative management.  Follow-Up Instructions: Return if symptoms worsen or fail to improve.   Orders:  No orders of the defined types were placed in this encounter.  No orders of the defined types were placed in this encounter.     Procedures: Large Joint Inj: R knee on 06/17/2020 4:02 PM Indications: diagnostic evaluation, joint swelling and pain Details: 18 G 1.5 in needle, superolateral approach  Arthrogram: No  Medications: 5 mL lidocaine 1 %; 40 mg methylPREDNISolone acetate 40 MG/ML; 4 mL bupivacaine 0.25 % Outcome: tolerated well, no immediate complications Procedure, treatment  alternatives, risks and benefits explained, specific risks discussed. Consent was given by the patient. Immediately prior to procedure a time out was called to verify the correct patient, procedure, equipment, support staff and site/side marked as required. Patient was prepped and draped in the usual sterile fashion.       Clinical Data: No additional findings.  Objective: Vital Signs: There were no vitals taken for this visit.  Physical Exam:   Constitutional: Patient appears well-developed HEENT:  Head: Normocephalic Eyes:EOM are normal Neck: Normal range of motion Cardiovascular: Normal rate Pulmonary/chest: Effort normal Neurologic: Patient is alert Skin: Skin is warm Psychiatric: Patient has normal mood and affect    Ortho Exam: Ortho exam demonstrates full active and passive range of motion of the hip and ankle on the right-hand side. Patient has palpable pedal pulses. Medial great lateral joint line tenderness but intact extensor mechanism. Has about a 5 degree flexion contracture. No other masses lymphadenopathy or skin changes noted in that right knee region.  Specialty Comments:  No specialty comments available.  Imaging: No results found.   PMFS History: Patient Active Problem List   Diagnosis Date Noted  . Right knee pain 11/12/2019  . Pain and swelling of right lower leg 11/12/2019  . Weight gain 10/17/2019  . Wheezing 10/17/2019  . Acute dysfunction of left eustachian tube 01/16/2019  . Hyperglycemia 01/16/2019  . CKD (chronic kidney disease) stage 3, GFR 30-59 ml/min (  HCC) 04/28/2017  . Hemorrhoids 04/28/2017  . Bilateral hearing loss 11/06/2016  . Headache 11/05/2016  . Thrush 11/05/2016  . Paresthesia 09/27/2016  . Vertigo 07/04/2016  . Urinary retention 06/29/2016  . Gait disorder 06/29/2016  . General weakness 09/16/2015  . Seizures (HCC) 08/27/2015  . Dry eyes 05/27/2015  . Dry skin 03/12/2015  . Rash and nonspecific skin eruption  03/12/2015  . Renal insufficiency 03/12/2015  . Esophageal dysmotility 01/15/2015  . Colon cancer screening - declined 01/15/2015  . Syncope and collapse 10/03/2014  . Constipation 09/05/2014  . Encounter for well adult exam with abnormal findings 09/05/2014  . Osteoporosis 09/05/2014  . Vitamin D deficiency 09/05/2014  . Allergic rhinitis 09/05/2014  . 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  . Insomnia 10/02/2013  . Weight loss 04/04/2013  . Localization-related focal epilepsy with simple partial seizures (HCC) 11/15/2012  . Dementia (HCC) 11/15/2012  . Hyperlipidemia 08/06/2012  . HTN (hypertension) 10/29/2010  . Eczema 10/29/2010  . History of cervical cancer 10/29/2010   Past Medical History:  Diagnosis Date  . Allergic rhinitis 09/05/2014  . Allergic rhinitis, cause unspecified 10/29/2010  . Chronic constipation 10/29/2010  . CKD (chronic kidney disease) stage 3, GFR 30-59 ml/min (HCC) 04/28/2017  . Eczema 10/29/2010  . Gait difficulty   . H/O: hysterectomy 10/29/2010  . History of cervical cancer 10/29/2010  . HTN (hypertension) 10/29/2010  . Hyperlipidemia   . Hypertension   . Impaired glucose tolerance 08/16/2012  . Loss of appetite   . Memory loss   . Orthostatic hypotension 06/05/2012  . Osteoporosis   . Right hip pain   . Seizures (HCC)   . Vascular dementia (HCC)   . Vitamin D deficiency 09/05/2014    Family History  Problem Relation Age of Onset  . Hypertension Mother   . Hypertension Father   . Diabetes Father   . High blood pressure Daughter        x2  . Colon cancer Neg Hx   . Colon polyps Neg Hx   . Kidney disease Neg Hx   . Gallbladder disease Neg Hx     Past Surgical History:  Procedure Laterality Date  . ABDOMINAL HYSTERECTOMY  1970  . BREAST BIOPSY  1980's   benign  . CATARACT EXTRACTION W/ INTRAOCULAR LENS  IMPLANT, BILATERAL  01/2011  . EYE SURGERY     Social History   Occupational History  . Occupation: retired Runner, broadcasting/film/video  Tobacco Use  . Smoking  status: Former Smoker    Types: Cigarettes    Quit date: 07/13/1971    Years since quitting: 48.9  . Smokeless tobacco: Never Used  . Tobacco comment: quit in 1973  Vaping Use  . Vaping Use: Never used  Substance and Sexual Activity  . Alcohol use: No    Alcohol/week: 0.0 standard drinks  . Drug use: No  . Sexual activity: Never

## 2020-06-20 ENCOUNTER — Encounter: Payer: Self-pay | Admitting: Orthopedic Surgery

## 2020-06-20 ENCOUNTER — Ambulatory Visit (INDEPENDENT_AMBULATORY_CARE_PROVIDER_SITE_OTHER): Payer: Medicare HMO | Admitting: Orthopedic Surgery

## 2020-06-20 DIAGNOSIS — M1711 Unilateral primary osteoarthritis, right knee: Secondary | ICD-10-CM

## 2020-06-21 ENCOUNTER — Encounter: Payer: Self-pay | Admitting: Orthopedic Surgery

## 2020-06-21 DIAGNOSIS — M1711 Unilateral primary osteoarthritis, right knee: Secondary | ICD-10-CM | POA: Diagnosis not present

## 2020-06-21 MED ORDER — LIDOCAINE HCL 1 % IJ SOLN
5.0000 mL | INTRAMUSCULAR | Status: AC | PRN
  Administered 2020-06-21: 07:00:00 5 mL

## 2020-06-21 MED ORDER — HYALURONAN 88 MG/4ML IX SOSY
88.0000 mg | PREFILLED_SYRINGE | INTRA_ARTICULAR | Status: AC | PRN
  Administered 2020-06-21: 07:00:00 88 mg via INTRA_ARTICULAR

## 2020-06-21 NOTE — Progress Notes (Signed)
   Procedure Note  Patient: Teresa Riley             Date of Birth: 04/01/1935           MRN: [id number]             Visit Date: 06/20/2020  Procedures: Visit Diagnoses:  1. Arthritis of right knee     Large Joint Inj: R knee on 06/21/2020 7:25 AM Indications: diagnostic evaluation, joint swelling and pain Details: 18 G 1.5 in needle, superolateral approach  Arthrogram: No  Medications: 5 mL lidocaine 1 %; 88 mg Hyaluronan 88 MG/4ML Aspirate: 30 mL yellow Outcome: tolerated well, no immediate complications Procedure, treatment alternatives, risks and benefits explained, specific risks discussed. Consent was given by the patient. Immediately prior to procedure a time out was called to verify the correct patient, procedure, equipment, support staff and site/side marked as required. Patient was prepped and draped in the usual sterile fashion.

## 2020-06-27 ENCOUNTER — Encounter: Payer: Self-pay | Admitting: Internal Medicine

## 2020-06-27 ENCOUNTER — Ambulatory Visit (INDEPENDENT_AMBULATORY_CARE_PROVIDER_SITE_OTHER): Payer: Medicare HMO | Admitting: Internal Medicine

## 2020-06-27 ENCOUNTER — Other Ambulatory Visit: Payer: Self-pay

## 2020-06-27 VITALS — BP 130/82 | HR 76 | Temp 98.3°F | Ht 64.0 in | Wt 193.0 lb

## 2020-06-27 DIAGNOSIS — N183 Chronic kidney disease, stage 3 unspecified: Secondary | ICD-10-CM

## 2020-06-27 DIAGNOSIS — R739 Hyperglycemia, unspecified: Secondary | ICD-10-CM | POA: Diagnosis not present

## 2020-06-27 DIAGNOSIS — Z23 Encounter for immunization: Secondary | ICD-10-CM | POA: Diagnosis not present

## 2020-06-27 DIAGNOSIS — F0390 Unspecified dementia without behavioral disturbance: Secondary | ICD-10-CM

## 2020-06-27 DIAGNOSIS — G8929 Other chronic pain: Secondary | ICD-10-CM

## 2020-06-27 DIAGNOSIS — M810 Age-related osteoporosis without current pathological fracture: Secondary | ICD-10-CM

## 2020-06-27 DIAGNOSIS — I10 Essential (primary) hypertension: Secondary | ICD-10-CM

## 2020-06-27 DIAGNOSIS — E559 Vitamin D deficiency, unspecified: Secondary | ICD-10-CM

## 2020-06-27 DIAGNOSIS — F418 Other specified anxiety disorders: Secondary | ICD-10-CM | POA: Diagnosis not present

## 2020-06-27 DIAGNOSIS — E785 Hyperlipidemia, unspecified: Secondary | ICD-10-CM | POA: Diagnosis not present

## 2020-06-27 DIAGNOSIS — M25561 Pain in right knee: Secondary | ICD-10-CM

## 2020-06-27 MED ORDER — AMLODIPINE BESYLATE 10 MG PO TABS: ORAL_TABLET | ORAL | 3 refills | Status: DC

## 2020-06-27 MED ORDER — ALPRAZOLAM 0.25 MG PO TABS
0.2500 mg | ORAL_TABLET | Freq: Two times a day (BID) | ORAL | 2 refills | Status: DC | PRN
Start: 2020-06-27 — End: 2020-12-26

## 2020-06-27 MED ORDER — ATORVASTATIN CALCIUM 10 MG PO TABS: ORAL_TABLET | ORAL | 3 refills | Status: DC

## 2020-06-27 MED ORDER — CITALOPRAM HYDROBROMIDE 10 MG PO TABS: ORAL_TABLET | ORAL | 3 refills | Status: DC

## 2020-06-27 NOTE — Patient Instructions (Signed)
You had the flu shot today  We will need to reschedule you for the prolia shot  Please continue all other medications as before, and refills have been done if requested.  Please have the pharmacy call with any other refills you may need.  Please continue your efforts at being more active, low cholesterol diet, and weight control.  You are otherwise up to date with prevention measures today.  Please keep your appointments with your specialists as you may have planned - orthopedic  Please call for Home Health and Physical Therapy referral if you change your mind about this in the next 90 days  Please make an Appointment to return in 6 months, or sooner if needed

## 2020-06-27 NOTE — Progress Notes (Signed)
Subjective:    Patient ID: Teresa Riley, female    DOB: [birth year], 85 y.o.   MRN: [id number]  HPI  Here to f/u with daughter; overall doing ok,  Pt denies chest pain, increasing sob or doe, wheezing, orthopnea, PND, increased LE swelling, palpitations, dizziness or syncope.  Pt denies new neurological symptoms such as new headache, or facial or extremity weakness or numbness.  Pt denies polydipsia, polyuria, or low sugar episode.  Pt states overall good compliance with meds, mostly trying to follow appropriate diet, with wt overall stable,  but little exercise however, due to chronic right knee pain, has been counseled she is high risk surgical candidate due to dementia and unlikley good rehab candidate, plans to f/u ortho.  Has ongoing gait d/o as result it seems , trying to walk less and seems to be getting overall weaker, but declines HH and PT for now.  Daughter states memory overall some worse.  Anxiety has overall increaed as well, asks for xanax refill.  Tends to help with sleep, and due for Prolia shot soon. Past Medical History:  Diagnosis Date  . Allergic rhinitis 09/05/2014  . Allergic rhinitis, cause unspecified 10/29/2010  . Chronic constipation 10/29/2010  . CKD (chronic kidney disease) stage 3, GFR 30-59 ml/min (HCC) 04/28/2017  . Eczema 10/29/2010  . Gait difficulty   . H/O: hysterectomy 10/29/2010  . History of cervical cancer 10/29/2010  . HTN (hypertension) 10/29/2010  . Hyperlipidemia   . Hypertension   . Impaired glucose tolerance 08/16/2012  . Loss of appetite   . Memory loss   . Orthostatic hypotension 06/05/2012  . Osteoporosis   . Right hip pain   . Seizures (HCC)   . Vascular dementia (HCC)   . Vitamin D deficiency 09/05/2014   Past Surgical History:  Procedure Laterality Date  . ABDOMINAL HYSTERECTOMY  1970  . BREAST BIOPSY  1980's   benign  . CATARACT EXTRACTION W/ INTRAOCULAR LENS  IMPLANT, BILATERAL  01/2011  . EYE SURGERY      reports that she quit smoking  about 48 years ago. Her smoking use included cigarettes. She has never used smokeless tobacco. She reports that she does not drink alcohol and does not use drugs. family history includes Diabetes in her father; High blood pressure in her daughter; Hypertension in her father and mother. No Known Allergies Current Outpatient Medications on File Prior to Visit  Medication Sig Dispense Refill  . acetaminophen (TYLENOL) 325 MG tablet Take 325-650 mg by mouth 2 (two) times daily as needed (eye pain).    Marland Kitchen albuterol (PROVENTIL) (2.5 MG/3ML) 0.083% nebulizer solution Take 3 mLs (2.5 mg total) by nebulization every 6 (six) hours as needed for wheezing or shortness of breath. 150 mL 1  . aspirin EC 81 MG tablet Take 81 mg by mouth daily.     . Calcium-Magnesium-Vitamin D (CALCIUM MAGNESIUM PO) Take 1 tablet by mouth daily with lunch.     . denosumab (PROLIA) 60 MG/ML SOSY injection Inject 60 mg into the skin every 6 (six) months.    . fluocinonide cream (LIDEX) 0.05 % Apply topically 2 (two) times daily. 30 g 1  . fluticasone (FLONASE) 50 MCG/ACT nasal spray Place 1 spray into both nostrils daily.    Marland Kitchen guaiFENesin (MUCINEX) 600 MG 12 hr tablet Take 2 tablets (1,200 mg total) by mouth 2 (two) times daily as needed. 120 tablet 1  . HYDROcodone-acetaminophen (NORCO/VICODIN) 5-325 MG tablet Take 1 tablet by mouth every 6 (  six) hours as needed for moderate pain. 30 tablet 0  . hydrocortisone cream 1 % Apply 1 application topically 2 (two) times daily as needed for itching (eczema/rash).    . labetalol (NORMODYNE) 300 MG tablet TAKE 1 TABLET(300 MG) BY MOUTH TWICE DAILY 180 tablet 3  . Liraglutide -Weight Management (SAXENDA) 18 MG/3ML SOPN Inject 0.1 mLs (0.6 mg total) into the skin once a week. 3 mL 3  . lisinopril (ZESTRIL) 40 MG tablet Take 1 tablet (40 mg total) by mouth daily. 90 tablet 3  . loratadine (CLARITIN) 10 MG tablet Take 10 mg by mouth daily as needed for allergies or rhinitis.     . Memantine  HCl-Donepezil HCl (NAMZARIC) 28-10 MG CP24 Take 1 capsule by mouth at bedtime. 90 capsule 3  . Misc. Devices (CANE) MISC Use as directed daily; 1 each 0  . Multiple Vitamins-Minerals (SUPER THERA VITE M) TABS Take by mouth.    . Omega-3 Fatty Acids (OMEGA 3 PO) Take 1 capsule by mouth daily with lunch.     Bertram Gala Glycol-Propyl Glycol (SYSTANE OP) Place 1 drop into both eyes daily as needed (dry eyes).     . QUEtiapine (SEROQUEL) 50 MG tablet TAKE 1 TABLET(50 MG) BY MOUTH AT BEDTIME 90 tablet 3   No current facility-administered medications on file prior to visit.   Review of Systems All otherwise neg per pt    Objective:   Physical Exam BP 130/82 (BP Location: Left Arm, Patient Position: Sitting, Cuff Size: Large)   Pulse 76   Temp 98.3 F (36.8 C) (Oral)   Ht 5\' 4"  (1.626 m)   Wt 193 lb (87.5 kg)   SpO2 96%   BMI 33.13 kg/m  VS noted,  Constitutional: Pt appears in NAD HENT: Head: NCAT.  Right Ear: External ear normal.  Left Ear: External ear normal.  Eyes: . Pupils are equal, round, and reactive to light. Conjunctivae and EOM are normal Nose: without d/c or deformity Neck: Neck supple. Gross normal ROM Cardiovascular: Normal rate and regular rhythm.   Pulmonary/Chest: Effort normal and breath sounds without rales or wheezing.  Abd:  Soft, NT, ND, + BS, no organomegaly Neurological: Pt is alert. At baseline orientation, motor grossly intact Skin: Skin is warm. No rashes, other new lesions, no LE edema Psychiatric: Pt behavior is normal without agitation  All otherwise neg per pt Lab Results  Component Value Date   WBC 5.7 11/16/2019   HGB 12.8 11/16/2019   HCT 38.2 11/16/2019   PLT 251.0 11/16/2019   GLUCOSE 92 11/16/2019   CHOL 180 11/16/2019   TRIG 142.0 11/16/2019   HDL 53.20 11/16/2019   LDLDIRECT 93.0 09/16/2015   LDLCALC 98 11/16/2019   ALT 11 11/16/2019   AST 22 11/16/2019   NA 139 11/16/2019   K 4.0 11/16/2019   CL 104 11/16/2019   CREATININE 0.95  11/16/2019   BUN 14 11/16/2019   CO2 30 11/16/2019   TSH 1.55 11/16/2019   HGBA1C 6.4 11/16/2019      Assessment & Plan:

## 2020-06-29 ENCOUNTER — Encounter: Payer: Self-pay | Admitting: Internal Medicine

## 2020-06-29 NOTE — Assessment & Plan Note (Signed)
stable overall by history and exam, recent data reviewed with pt, and pt to continue medical treatment as before,  to f/u any worsening symptoms or concerns  

## 2020-06-29 NOTE — Assessment & Plan Note (Signed)
D/w daughter, prolia injection needs to be coordinated as we do not stock the med for admin at todays visit

## 2020-06-29 NOTE — Assessment & Plan Note (Signed)
Lab Results  Component Value Date   LDLCALC 98 11/16/2019  stable overall by history and exam, recent data reviewed with pt, and pt to continue medical treatment as before,  to f/u any worsening symptoms or concerns

## 2020-06-29 NOTE — Assessment & Plan Note (Signed)
Cont oral replacement 

## 2020-06-29 NOTE — Assessment & Plan Note (Signed)
With end stage DJD , no surgical, delcines PT for now,  to f/u any worsening symptoms or concerns

## 2020-06-29 NOTE — Assessment & Plan Note (Signed)
Mild worse per daughter, cont namenda/donepizil,  to f/u any worsening symptoms or concerns

## 2020-06-29 NOTE — Assessment & Plan Note (Addendum)
stable overall by history and exam, recent data reviewed with pt, and pt to continue medical treatment as before,  to f/u any worsening symptoms or concerns  I spent 41 minutes in preparing to see the patient by review of recent labs, imaging and procedures, obtaining and reviewing separately obtained history, communicating with the patient and family or caregiver, ordering medications, tests or procedures, and documenting clinical information in the EHR including the differential Dx, treatment, and any further evaluation and other management of htn, dementia, hld, anxiety, ckd, hyperglycemia, right knee pain, osteoporosis, vit d def

## 2020-06-29 NOTE — Assessment & Plan Note (Signed)
Ok for xanax refill

## 2020-08-04 DIAGNOSIS — H6123 Impacted cerumen, bilateral: Secondary | ICD-10-CM | POA: Diagnosis not present

## 2020-08-27 ENCOUNTER — Ambulatory Visit (INDEPENDENT_AMBULATORY_CARE_PROVIDER_SITE_OTHER): Payer: Medicare HMO | Admitting: Orthopedic Surgery

## 2020-08-27 ENCOUNTER — Telehealth: Payer: Self-pay

## 2020-08-27 DIAGNOSIS — M1711 Unilateral primary osteoarthritis, right knee: Secondary | ICD-10-CM | POA: Diagnosis not present

## 2020-08-27 NOTE — Telephone Encounter (Signed)
Noted  

## 2020-08-27 NOTE — Telephone Encounter (Signed)
Can we get auth for gel injection for patient to be done in May for her right knee?  Thanks.

## 2020-08-30 ENCOUNTER — Encounter: Payer: Self-pay | Admitting: Orthopedic Surgery

## 2020-08-30 NOTE — Progress Notes (Signed)
Office Visit Note   Patient: Teresa Riley           Date of Birth: July 31, 1934           MRN: [id number] Visit Date: 08/27/2020 Requested by: Corwin Levins, MD [address] [place],  [place] [address] PCP: Corwin Levins, MD  Subjective: Chief Complaint  Patient presents with  . Right Knee - Pain    HPI: Briahnna is a patient with known right knee arthritis.  She has had aspiration and injections done in the past.  She ambulates with a cane.  It is painful for her to weight-bear.  She is here with her daughter.  In general she states her right knee is feeling better.  She did have an injection in December.  She is concerned because she thinks she may have fluid on her knee.              ROS: All systems reviewed are negative as they relate to the chief complaint within the history of present illness.  Patient denies  fevers or chills.   Assessment & Plan: Visit Diagnoses:  1. Arthritis of right knee     Plan: Impression is right knee pain with history of arthritis but overall trajectory is stable.  We talked about knee replacement in the past and she is not really a great candidate at this time for knee replacement.  The effusion in her knee is not too significant today.  Probably 10 to 15 cc at most.  Plan to preapproved for gel injection for may.  Daughter is working on art history degree  This patient is diagnosed with osteoarthritis of the knee(s).    Radiographs show evidence of joint space narrowing, osteophytes, subchondral sclerosis and/or subchondral cysts.  This patient has knee pain which interferes with functional and activities of daily living.    This patient has experienced inadequate response, adverse effects and/or intolerance with conservative treatments such as acetaminophen, NSAIDS, topical creams, physical therapy or regular exercise, knee bracing and/or weight loss.   This patient has experienced inadequate response or has a contraindication to intra articular  steroid injections for at least 3 months.   This patient is not scheduled to have a total knee replacement within 6 months of starting treatment with viscosupplementation. .  Follow-Up Instructions: No follow-ups on file.   Orders:  No orders of the defined types were placed in this encounter.  No orders of the defined types were placed in this encounter.     Procedures: No procedures performed   Clinical Data: No additional findings.  Objective: Vital Signs: There were no vitals taken for this visit.  Physical Exam:   Constitutional: Patient appears well-developed HEENT:  Head: Normocephalic Eyes:EOM are normal Neck: Normal range of motion Cardiovascular: Normal rate Pulmonary/chest: Effort normal Neurologic: Patient is alert Skin: Skin is warm Psychiatric: Patient has normal mood and affect    Ortho Exam: Ortho exam demonstrates pretty good quad tone for patient in this age group.  Ankle dorsiflexion plantarflexion intact.  Pedal pulses palpable.  No masses lymphadenopathy or skin changes noted in that right knee region.  Trace to mild effusion is present 10 cc at most.  Extensor mechanism intact.  Specialty Comments:  No specialty comments available.  Imaging: No results found.   PMFS History: Patient Active Problem List   Diagnosis Date Noted  . Right knee pain 11/12/2019  . Pain and swelling of right lower leg 11/12/2019  .  Weight gain 10/17/2019  . Wheezing 10/17/2019  . Acute dysfunction of left eustachian tube 01/16/2019  . Hyperglycemia 01/16/2019  . CKD (chronic kidney disease) stage 3, GFR 30-59 ml/min (HCC) 04/28/2017  . Hemorrhoids 04/28/2017  . Bilateral hearing loss 11/06/2016  . Headache 11/05/2016  . Thrush 11/05/2016  . Paresthesia 09/27/2016  . Vertigo 07/04/2016  . Urinary retention 06/29/2016  . Gait disorder 06/29/2016  . General weakness 09/16/2015  . Seizures (HCC) 08/27/2015  . Dry eyes 05/27/2015  . Dry skin 03/12/2015  .  Rash and nonspecific skin eruption 03/12/2015  . Esophageal dysmotility 01/15/2015  . Colon cancer screening - declined 01/15/2015  . Syncope and collapse 10/03/2014  . Constipation 09/05/2014  . Encounter for well adult exam with abnormal findings 09/05/2014  . Osteoporosis 09/05/2014  . Vitamin D deficiency 09/05/2014  . Allergic rhinitis 09/05/2014  . 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  . Insomnia 10/02/2013  . Weight loss 04/04/2013  . Localization-related focal epilepsy with simple partial seizures (HCC) 11/15/2012  . Dementia (HCC) 11/15/2012  . Hyperlipidemia 08/06/2012  . HTN (hypertension) 10/29/2010  . Eczema 10/29/2010  . History of cervical cancer 10/29/2010   Past Medical History:  Diagnosis Date  . Allergic rhinitis 09/05/2014  . Allergic rhinitis, cause unspecified 10/29/2010  . Chronic constipation 10/29/2010  . CKD (chronic kidney disease) stage 3, GFR 30-59 ml/min (HCC) 04/28/2017  . Eczema 10/29/2010  . Gait difficulty   . H/O: hysterectomy 10/29/2010  . History of cervical cancer 10/29/2010  . HTN (hypertension) 10/29/2010  . Hyperlipidemia   . Hypertension   . Impaired glucose tolerance 08/16/2012  . Loss of appetite   . Memory loss   . Orthostatic hypotension 06/05/2012  . Osteoporosis   . Right hip pain   . Seizures (HCC)   . Vascular dementia (HCC)   . Vitamin D deficiency 09/05/2014    Family History  Problem Relation Age of Onset  . Hypertension Mother   . Hypertension Father   . Diabetes Father   . High blood pressure Daughter        x2  . Colon cancer Neg Hx   . Colon polyps Neg Hx   . Kidney disease Neg Hx   . Gallbladder disease Neg Hx     Past Surgical History:  Procedure Laterality Date  . ABDOMINAL HYSTERECTOMY  1970  . BREAST BIOPSY  1980's   benign  . CATARACT EXTRACTION W/ INTRAOCULAR LENS  IMPLANT, BILATERAL  01/2011  . EYE SURGERY     Social History   Occupational History  . Occupation: retired Runner, broadcasting/film/video  Tobacco Use  . Smoking  status: Former Smoker    Types: Cigarettes    Quit date: 07/13/1971    Years since quitting: 49.1  . Smokeless tobacco: Never Used  . Tobacco comment: quit in 1973  Vaping Use  . Vaping Use: Never used  Substance and Sexual Activity  . Alcohol use: No    Alcohol/week: 0.0 standard drinks  . Drug use: No  . Sexual activity: Never

## 2020-09-05 ENCOUNTER — Telehealth: Payer: Self-pay

## 2020-09-05 NOTE — Telephone Encounter (Signed)
Next available gel injection would need to be after 12/19/2020, due to last Monovisc, right knee being done on 06/20/2020.   Will submit for VOB in MAY, 2022

## 2020-10-27 ENCOUNTER — Other Ambulatory Visit: Payer: Self-pay

## 2020-10-27 MED ORDER — LISINOPRIL 40 MG PO TABS: 40.0000 mg | ORAL_TABLET | Freq: Every day | ORAL | 0 refills | Status: DC

## 2020-11-12 ENCOUNTER — Ambulatory Visit: Payer: Medicare HMO | Admitting: Orthopedic Surgery

## 2020-11-13 ENCOUNTER — Encounter: Payer: Self-pay | Admitting: Orthopedic Surgery

## 2020-11-13 ENCOUNTER — Ambulatory Visit (INDEPENDENT_AMBULATORY_CARE_PROVIDER_SITE_OTHER): Payer: Medicare HMO | Admitting: Orthopedic Surgery

## 2020-11-13 DIAGNOSIS — N183 Chronic kidney disease, stage 3 unspecified: Secondary | ICD-10-CM | POA: Diagnosis not present

## 2020-11-13 DIAGNOSIS — M1711 Unilateral primary osteoarthritis, right knee: Secondary | ICD-10-CM | POA: Diagnosis not present

## 2020-11-13 MED ORDER — BUPIVACAINE HCL 0.25 % IJ SOLN
4.0000 mL | INTRAMUSCULAR | Status: AC | PRN
  Administered 2020-11-13: 4 mL via INTRA_ARTICULAR

## 2020-11-13 MED ORDER — LIDOCAINE HCL 1 % IJ SOLN
5.0000 mL | INTRAMUSCULAR | Status: AC | PRN
  Administered 2020-11-13: 5 mL

## 2020-11-13 NOTE — Progress Notes (Signed)
Office Visit Note   Patient: Teresa Riley           Date of Birth: Dec 06, 1934           MRN: [id number] Visit Date: 11/13/2020 Requested by: Corwin Levins, MD [address] [place],  [place] [address] PCP: Corwin Levins, MD  Subjective: Chief Complaint  Patient presents with  . Right Knee - Pain    HPI: Teresa Riley is a 86 y.o. female who presents to the office complaining of right knee pain.  She returns for reevaluation of right knee pain.  Reports her right knee is "still not 100%".  Complains of stiffness and swelling.  Occasionally gives way on her but no frank instability or mechanical symptoms.  She is ambulating with a cane.  She reports her knee feels "80% today".  She is able to do stairs.  She is walking around the house.  Not eligible for gel injection until 12/19/2020.  Would like some sort of intervention to tide her over until the gel injection..                ROS: All systems reviewed are negative as they relate to the chief complaint within the history of present illness.  Patient denies fevers or chills.  Assessment & Plan: Visit Diagnoses: No diagnosis found.  Plan: Patient is an 86 year old female who presents complaint of right knee pain.  Has history of right knee osteoarthritis.  Here today for right knee pain but not eligible for gel injection until mid June.  Plan for aspiration of small joint effusion with injection of Toradol to bridge the gap until June.  Patient and daughter agreed with this plan.  She tolerated the procedure well.  20 cc of inflammatory joint fluid was aspirated.  Follow-up in June for gel injection.  Follow-Up Instructions: No follow-ups on file.   Orders:  No orders of the defined types were placed in this encounter.  No orders of the defined types were placed in this encounter.     Procedures: Large Joint Inj: R knee on 11/13/2020 9:05 AM Indications: diagnostic evaluation, joint swelling and pain Details: 18 G 1.5 in needle,  superolateral approach  Arthrogram: No  Medications: 5 mL lidocaine 1 %; 4 mL bupivacaine 0.25 % (1 cc of Toradol.) Aspirate: 20 mL yellow Outcome: tolerated well, no immediate complications Procedure, treatment alternatives, risks and benefits explained, specific risks discussed. Consent was given by the patient. Immediately prior to procedure a time out was called to verify the correct patient, procedure, equipment, support staff and site/side marked as required. Patient was prepped and draped in the usual sterile fashion.       Clinical Data: No additional findings.  Objective: Vital Signs: There were no vitals taken for this visit.  Physical Exam:  Constitutional: Patient appears well-developed HEENT:  Head: Normocephalic Eyes:EOM are normal Neck: Normal range of motion Cardiovascular: Normal rate Pulmonary/chest: Effort normal Neurologic: Patient is alert Skin: Skin is warm Psychiatric: Patient has normal mood and affect  Ortho Exam: Ortho exam demonstrates right knee with intact extensor mechanism.  Small effusion that is about 15 to 20 cc.  Medial and lateral joint line tenderness is present.  Extends to 0 degrees.  No calf tenderness.  Negative Homans' sign.  Specialty Comments:  No specialty comments available.  Imaging: No results found.   PMFS History: Patient Active Problem List   Diagnosis Date Noted  . Right knee pain 11/12/2019  .  Pain and swelling of right lower leg 11/12/2019  . Weight gain 10/17/2019  . Wheezing 10/17/2019  . Acute dysfunction of left eustachian tube 01/16/2019  . Hyperglycemia 01/16/2019  . CKD (chronic kidney disease) stage 3, GFR 30-59 ml/min (HCC) 04/28/2017  . Hemorrhoids 04/28/2017  . Bilateral hearing loss 11/06/2016  . Headache 11/05/2016  . Thrush 11/05/2016  . Paresthesia 09/27/2016  . Vertigo 07/04/2016  . Urinary retention 06/29/2016  . Gait disorder 06/29/2016  . General weakness 09/16/2015  . Seizures (HCC)  08/27/2015  . Dry eyes 05/27/2015  . Dry skin 03/12/2015  . Rash and nonspecific skin eruption 03/12/2015  . Esophageal dysmotility 01/15/2015  . Colon cancer screening - declined 01/15/2015  . Syncope and collapse 10/03/2014  . Constipation 09/05/2014  . Encounter for well adult exam with abnormal findings 09/05/2014  . Osteoporosis 09/05/2014  . Vitamin D deficiency 09/05/2014  . Allergic rhinitis 09/05/2014  . 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  . Insomnia 10/02/2013  . Weight loss 04/04/2013  . Localization-related focal epilepsy with simple partial seizures (HCC) 11/15/2012  . Dementia (HCC) 11/15/2012  . Hyperlipidemia 08/06/2012  . HTN (hypertension) 10/29/2010  . Eczema 10/29/2010  . History of cervical cancer 10/29/2010   Past Medical History:  Diagnosis Date  . Allergic rhinitis 09/05/2014  . Allergic rhinitis, cause unspecified 10/29/2010  . Chronic constipation 10/29/2010  . CKD (chronic kidney disease) stage 3, GFR 30-59 ml/min (HCC) 04/28/2017  . Eczema 10/29/2010  . Gait difficulty   . H/O: hysterectomy 10/29/2010  . History of cervical cancer 10/29/2010  . HTN (hypertension) 10/29/2010  . Hyperlipidemia   . Hypertension   . Impaired glucose tolerance 08/16/2012  . Loss of appetite   . Memory loss   . Orthostatic hypotension 06/05/2012  . Osteoporosis   . Right hip pain   . Seizures (HCC)   . Vascular dementia (HCC)   . Vitamin D deficiency 09/05/2014    Family History  Problem Relation Age of Onset  . Hypertension Mother   . Hypertension Father   . Diabetes Father   . High blood pressure Daughter        x2  . Colon cancer Neg Hx   . Colon polyps Neg Hx   . Kidney disease Neg Hx   . Gallbladder disease Neg Hx     Past Surgical History:  Procedure Laterality Date  . ABDOMINAL HYSTERECTOMY  1970  . BREAST BIOPSY  1980's   benign  . CATARACT EXTRACTION W/ INTRAOCULAR LENS  IMPLANT, BILATERAL  01/2011  . EYE SURGERY     Social History   Occupational  History  . Occupation: retired Runner, broadcasting/film/video  Tobacco Use  . Smoking status: Former Smoker    Types: Cigarettes    Quit date: 07/13/1971    Years since quitting: 49.3  . Smokeless tobacco: Never Used  . Tobacco comment: quit in 1973  Vaping Use  . Vaping Use: Never used  Substance and Sexual Activity  . Alcohol use: No    Alcohol/week: 0.0 standard drinks  . Drug use: No  . Sexual activity: Never

## 2020-11-27 ENCOUNTER — Telehealth: Payer: Self-pay

## 2020-11-27 NOTE — Telephone Encounter (Signed)
VOB has been submitted for Monovisc, right knee. Pending BV.  Submitted PA for Monovisc, right knee online through [facility] Pending PA# [phone]

## 2020-12-02 ENCOUNTER — Telehealth: Payer: Self-pay

## 2020-12-02 DIAGNOSIS — H524 Presbyopia: Secondary | ICD-10-CM | POA: Diagnosis not present

## 2020-12-02 DIAGNOSIS — H04203 Unspecified epiphora, bilateral lacrimal glands: Secondary | ICD-10-CM | POA: Diagnosis not present

## 2020-12-02 NOTE — Telephone Encounter (Signed)
PA still pending for Monovisc, right knee.

## 2020-12-03 ENCOUNTER — Telehealth: Payer: Self-pay | Admitting: Orthopedic Surgery

## 2020-12-03 NOTE — Telephone Encounter (Signed)
Teresa Riley with [facility] called stating she needs a call from April to discuss getting further documentation. She states we also have the options to do a P2P or withdraw; and we only have 24 hrs to respond.   CB# [phone]

## 2020-12-04 NOTE — Telephone Encounter (Signed)
Additional information has been uploaded online through [facility].

## 2020-12-09 ENCOUNTER — Telehealth: Payer: Self-pay

## 2020-12-09 NOTE — Telephone Encounter (Signed)
Called and left a VM advising patient to CB to schedule with Dr. August Saucer for gel injection after 12/19/2020.  Approved for Monovisc, right knee. Buy & Bill Patient will be responsible for 20% OOP. Co-pay of $20.00 PA Approval# [id number] Valid 11/27/2020- 02/25/2021

## 2020-12-15 DIAGNOSIS — Z0100 Encounter for examination of eyes and vision without abnormal findings: Secondary | ICD-10-CM | POA: Diagnosis not present

## 2020-12-25 ENCOUNTER — Other Ambulatory Visit: Payer: Self-pay

## 2020-12-25 ENCOUNTER — Encounter: Payer: Self-pay | Admitting: Orthopedic Surgery

## 2020-12-25 ENCOUNTER — Ambulatory Visit (INDEPENDENT_AMBULATORY_CARE_PROVIDER_SITE_OTHER): Payer: Medicare HMO | Admitting: Orthopedic Surgery

## 2020-12-25 DIAGNOSIS — M1711 Unilateral primary osteoarthritis, right knee: Secondary | ICD-10-CM | POA: Diagnosis not present

## 2020-12-25 MED ORDER — LIDOCAINE HCL 1 % IJ SOLN
5.0000 mL | INTRAMUSCULAR | Status: AC | PRN
  Administered 2020-12-25: 5 mL

## 2020-12-25 MED ORDER — HYALURONAN 88 MG/4ML IX SOSY
88.0000 mg | PREFILLED_SYRINGE | INTRA_ARTICULAR | Status: AC | PRN
  Administered 2020-12-25: 88 mg via INTRA_ARTICULAR

## 2020-12-25 NOTE — Progress Notes (Signed)
   Procedure Note  Patient: Teresa Riley             Date of Birth: 04/08/1935           MRN: [id number]             Visit Date: 12/25/2020  Procedures: Visit Diagnoses:  1. Arthritis of right knee     Large Joint Inj: R knee on 12/25/2020 8:56 AM Indications: pain, joint swelling and diagnostic evaluation Details: 18 G 1.5 in needle, superolateral approach  Arthrogram: No  Medications: 5 mL lidocaine 1 %; 88 mg Hyaluronan 88 MG/4ML Outcome: tolerated well, no immediate complications Procedure, treatment alternatives, risks and benefits explained, specific risks discussed. Consent was given by the patient. Immediately prior to procedure a time out was called to verify the correct patient, procedure, equipment, support staff and site/side marked as required. Patient was prepped and draped in the usual sterile fashion.

## 2020-12-26 ENCOUNTER — Other Ambulatory Visit: Payer: Self-pay | Admitting: Internal Medicine

## 2020-12-29 ENCOUNTER — Other Ambulatory Visit: Payer: Self-pay | Admitting: Internal Medicine

## 2020-12-29 MED ORDER — QUETIAPINE FUMARATE 50 MG PO TABS: ORAL_TABLET | ORAL | 1 refills | Status: DC

## 2021-01-07 ENCOUNTER — Other Ambulatory Visit: Payer: Self-pay | Admitting: Internal Medicine

## 2021-01-07 NOTE — Telephone Encounter (Signed)
Please refill as per office routine med refill policy (all routine meds refilled for 3 mo or monthly per pt preference up to one year from last visit, then month to month grace period for 3 mo, then further med refills will have to be denied)  

## 2021-01-10 ENCOUNTER — Other Ambulatory Visit: Payer: Self-pay | Admitting: Internal Medicine

## 2021-01-28 IMAGING — MR MR KNEE*R* W/O CM
7 series · 40 of 40 positions shown · non-contrast
Comparison: None.

CLINICAL DATA: Right knee pain and weakness. Instability.

EXAM:
MRI OF THE RIGHT KNEE WITHOUT CONTRAST
TECHNIQUE: Multiplanar, multisequence MR imaging of the knee was performed. No
intravenous contrast was administered.

[Series 10: T2 fat-sat · axial · 4.0mm · 0.53mm/px · z∈[-93,+67]mm · 6 of 33 slices shown (1 of 3)]
[im 1/33]
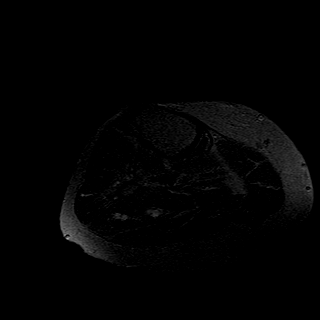
[im 7/33]
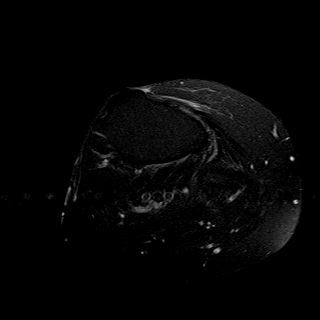
[im 13/33]
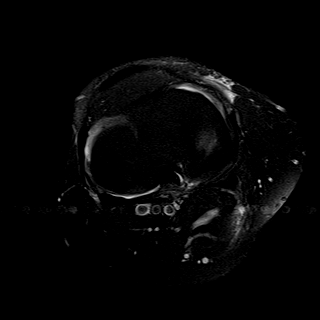
[im 20/33]
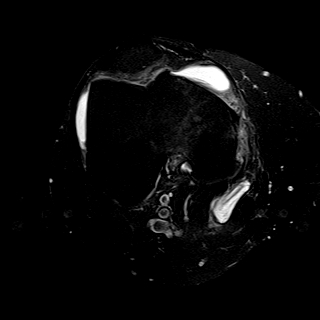
[im 26/33]
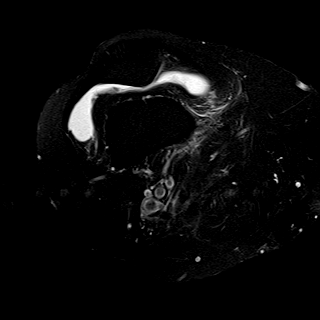
[im 33/33]
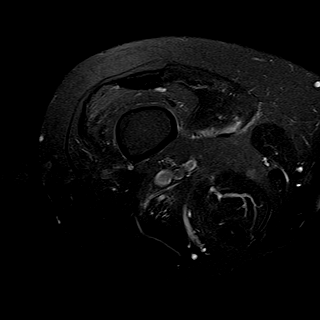

[Series 11: T1 · coronal · 4.0mm · 0.62mm/px · 6 of 31 slices shown]
[im 1/31]
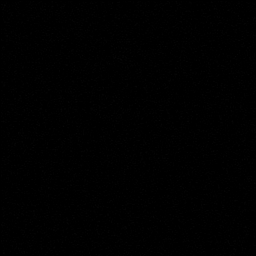
[im 7/31]
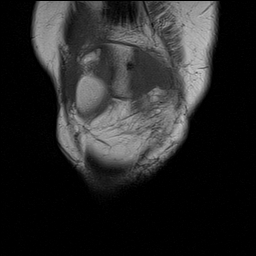
[im 13/31]
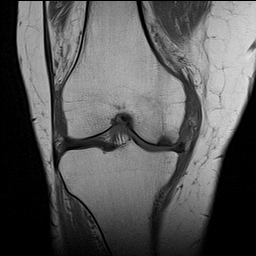
[im 19/31]
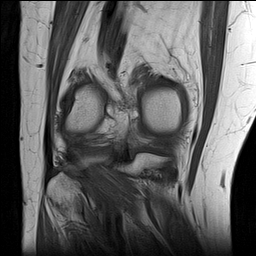
[im 25/31]
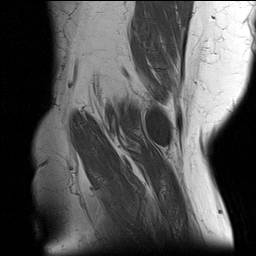
[im 31/31]
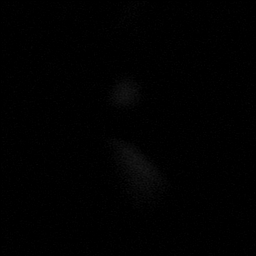

[Series 12: T2 fat-sat · coronal · 4.0mm · 0.62mm/px · 6 of 30 slices shown (2 of 3)]
[im 1/30]
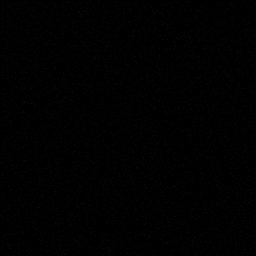
[im 6/30]
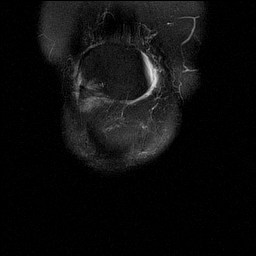
[im 12/30]
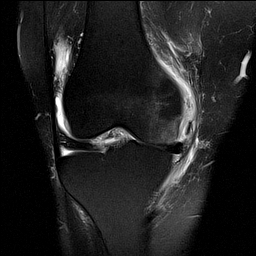
[im 18/30]
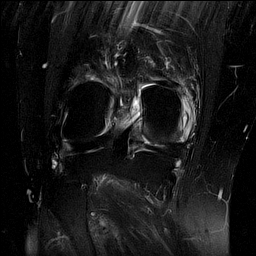
[im 24/30]
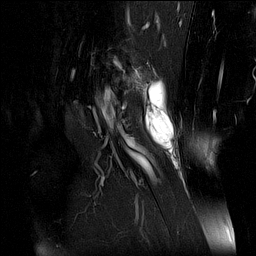
[im 30/30]
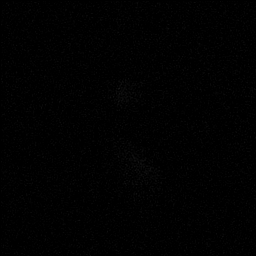

[Series 13: PD fat-sat · coronal · 4.0mm · 0.62mm/px · 6 of 29 slices shown (1 of 3)]
[im 1/29]
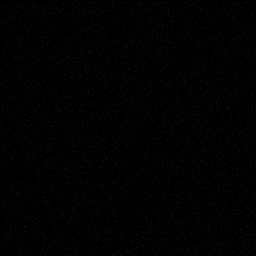
[im 6/29]
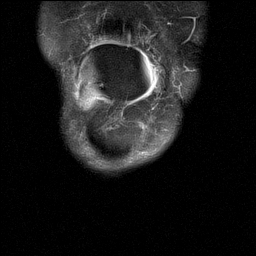
[im 12/29]
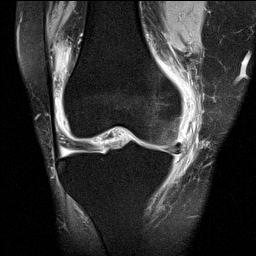
[im 17/29]
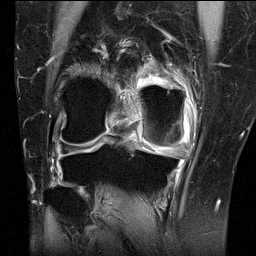
[im 23/29]
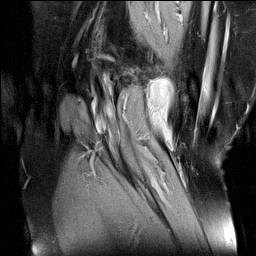
[im 29/29]
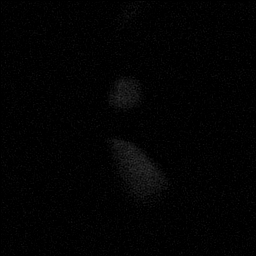

[Series 14: PD fat-sat · sagittal · 3.0mm · 0.62mm/px · 6 of 28 slices shown (2 of 3)]
[im 1/28]
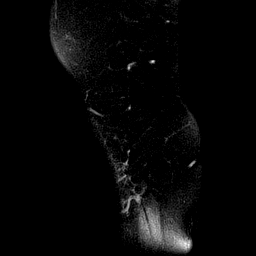
[im 6/28]
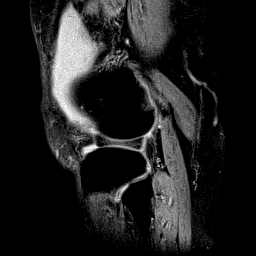
[im 11/28]
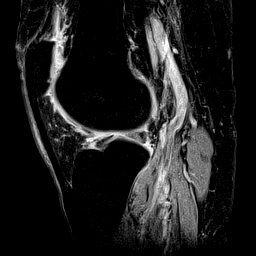
[im 17/28]
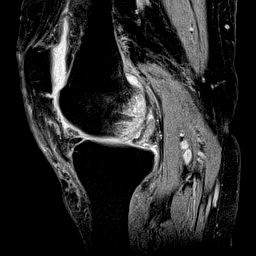
[im 22/28]
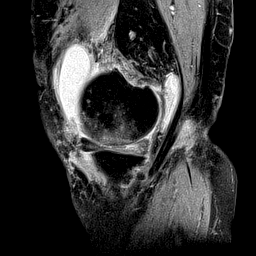
[im 28/28]
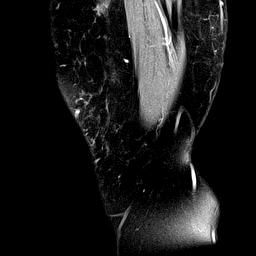

[Series 15: T2 fat-sat · sagittal · 3.0mm · 0.62mm/px · 6 of 28 slices shown (3 of 3)]
[im 1/28]
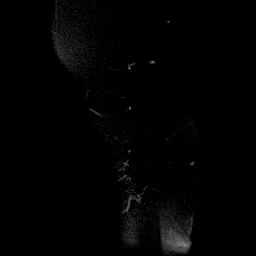
[im 6/28]
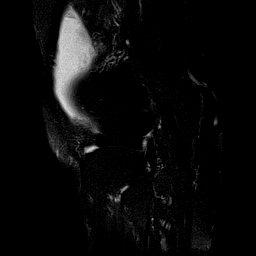
[im 11/28]
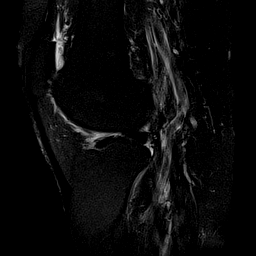
[im 17/28]
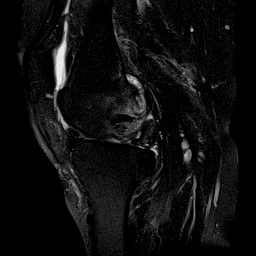
[im 22/28]
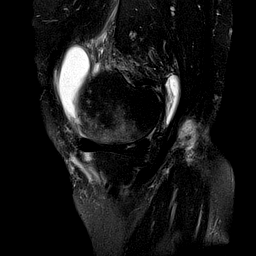
[im 28/28]
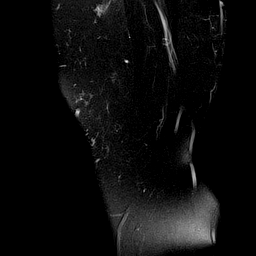

[Series 16: PD fat-sat · oblique · 2.0mm · 0.62mm/px · 4 of 19 slices shown (3 of 3)]
[im 1/19]
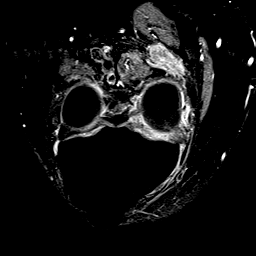
[im 7/19]
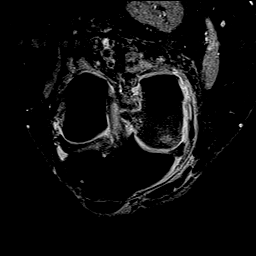
[im 13/19]
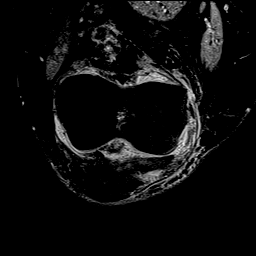
[im 19/19]
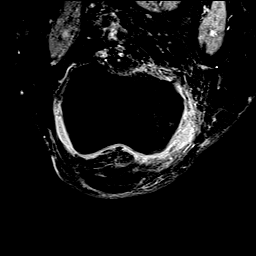

[40 of 40 positions shown; findings below may reference images not displayed]

FINDINGS: MENISCI

Medial meniscus: Large complex tear of the posterior horn of the
medial meniscus with a radial component and peripheral meniscal
extrusion.

Lateral meniscus:  Intact. Degeneration of the lateral meniscus.

LIGAMENTS

Cruciates:  Intact ACL and PCL.

Collaterals: Intact lateral collateral ligament complex. Mild
thickening of the proximal MCL with edema superficial to the MCL
most concerning for MCL strain without a tear.

CARTILAGE

Patellofemoral: Partial-thickness cartilage loss with areas of
high-grade partial-thickness cartilage loss of the lateral
patellofemoral compartment.

Medial: High-grade partial-thickness cartilage loss of the medial
femorotibial compartment.

Lateral: Partial thickness cartilage loss of the lateral
femorotibial compartment.

Joint: Moderate joint effusion. Mild edema in Hoffa's fat. No plical
thickening.

Popliteal Fossa:  Small Baker's cyst. Intact popliteus tendon.

Extensor Mechanism: Intact quadriceps tendon. Intact patellar
tendon. Intact medial patellar retinaculum. Intact lateral patellar
retinaculum. Intact MPFL.

Bones: Subchondral linear low signal in the weight-bearing surface
of the medial femoral condyle with severe surrounding marrow edema
most concerning for a nondisplaced, nondepressed subchondral
insufficiency fracture.

Other: No fluid collection or hematoma. Muscles are normal.
IMPRESSION: 1. Large complex tear of the posterior horn of the medial meniscus
with a radial component and peripheral meniscal extrusion.
2. Nondisplaced, nondepressed subchondral insufficiency fracture of
the medial femoral condyle with severe surrounding marrow edema.
3. Tricompartmental cartilage abnormalities as described above.
4. Moderate joint effusion.
5. Mild thickening of the proximal MCL with edema superficial to the
MCL most concerning for MCL strain without a tear.

## 2021-02-02 ENCOUNTER — Other Ambulatory Visit: Payer: Self-pay | Admitting: Internal Medicine

## 2021-02-24 ENCOUNTER — Ambulatory Visit (INDEPENDENT_AMBULATORY_CARE_PROVIDER_SITE_OTHER): Payer: Medicare HMO

## 2021-02-24 DIAGNOSIS — Z0000 Encounter for general adult medical examination without abnormal findings: Secondary | ICD-10-CM | POA: Diagnosis not present

## 2021-02-24 NOTE — Progress Notes (Signed)
I connected with Teresa Riley today by telephone and verified that I am speaking with the correct person using two identifiers. Location patient: home Location provider: work Persons participating in the virtual visit: Prisca Gearing and [employer], LPN.   I discussed the limitations, risks, security and privacy concerns of performing an evaluation and management service by telephone and the availability of in person appointments. I also discussed with the patient that there may be a patient responsible charge related to this service. The patient expressed understanding and verbally consented to this telephonic visit.    Interactive audio and video telecommunications were attempted between this provider and patient, however failed, due to patient having technical difficulties OR patient did not have access to video capability.  We continued and completed visit with audio only.  Some vital signs may be absent or patient reported.   Time Spent with patient on telephone encounter: 30 minutes  Subjective:   Teresa Riley is a 86 y.o. female who presents for Medicare Annual (Subsequent) preventive examination.  Review of Systems     Cardiac Risk Factors include: advanced age (>60men, >71 women);dyslipidemia;family history of premature cardiovascular disease;hypertension     Objective:    There were no vitals filed for this visit. There is no height or weight on file to calculate BMI.  Advanced Directives 02/24/2021 06/23/2014 05/08/2014 05/02/2014  Does Patient Have a Medical Advance Directive? No No No No  Would patient like information on creating a medical advance directive? No - Patient declined - No - patient declined information -    Current Medications (verified) Outpatient Encounter Medications as of 02/24/2021  Medication Sig   acetaminophen (TYLENOL) 325 MG tablet Take 325-650 mg by mouth 2 (two) times daily as needed (eye pain).   albuterol (PROVENTIL) (2.5 MG/3ML) 0.083%  nebulizer solution Take 3 mLs (2.5 mg total) by nebulization every 6 (six) hours as needed for wheezing or shortness of breath.   ALPRAZolam (XANAX) 0.25 MG tablet TAKE 1 TABLET(0.25 MG) BY MOUTH TWICE DAILY AS NEEDED   amLODipine (NORVASC) 10 MG tablet TAKE 1 TABLET(10 MG) BY MOUTH DAILY   aspirin EC 81 MG tablet Take 81 mg by mouth daily.    atorvastatin (LIPITOR) 10 MG tablet TAKE 1 TABLET(10 MG) BY MOUTH DAILY AT 6 PM   Calcium-Magnesium-Vitamin D (CALCIUM MAGNESIUM PO) Take 1 tablet by mouth daily with lunch.    citalopram (CELEXA) 10 MG tablet TAKE 1 TABLET(10 MG) BY MOUTH DAILY   denosumab (PROLIA) 60 MG/ML SOSY injection Inject 60 mg into the skin every 6 (six) months.   fluocinonide cream (LIDEX) 0.05 % Apply topically 2 (two) times daily.   fluticasone (FLONASE) 50 MCG/ACT nasal spray Place 1 spray into both nostrils daily.   guaiFENesin (MUCINEX) 600 MG 12 hr tablet Take 2 tablets (1,200 mg total) by mouth 2 (two) times daily as needed.   HYDROcodone-acetaminophen (NORCO/VICODIN) 5-325 MG tablet Take 1 tablet by mouth every 6 (six) hours as needed for moderate pain.   hydrocortisone cream 1 % Apply 1 application topically 2 (two) times daily as needed for itching (eczema/rash).   labetalol (NORMODYNE) 300 MG tablet TAKE 1 TABLET(300 MG) BY MOUTH TWICE DAILY   Liraglutide -Weight Management (SAXENDA) 18 MG/3ML SOPN Inject 0.1 mLs (0.6 mg total) into the skin once a week.   lisinopril (ZESTRIL) 40 MG tablet Take 1 tablet (40 mg total) by mouth daily.   loratadine (CLARITIN) 10 MG tablet Take 10 mg by mouth daily as needed  for allergies or rhinitis.    Memantine HCl-Donepezil HCl (NAMZARIC) 28-10 MG CP24 TAKE 1 CAPSULE BY MOUTH AT BEDTIME   Misc. Devices (CANE) MISC Use as directed daily;   Multiple Vitamins-Minerals (SUPER THERA VITE M) TABS Take by mouth.   Omega-3 Fatty Acids (OMEGA 3 PO) Take 1 capsule by mouth daily with lunch.    Polyethyl Glycol-Propyl Glycol (SYSTANE OP) Place 1  drop into both eyes daily as needed (dry eyes).    QUEtiapine (SEROQUEL) 50 MG tablet TAKE 1 TABLET(50 MG) BY MOUTH AT BEDTIME   No facility-administered encounter medications on file as of 02/24/2021.    Allergies (verified) Patient has no known allergies.   History: Past Medical History:  Diagnosis Date   Allergic rhinitis 09/05/2014   Allergic rhinitis, cause unspecified 10/29/2010   Chronic constipation 10/29/2010   CKD (chronic kidney disease) stage 3, GFR 30-59 ml/min (HCC) 04/28/2017   Eczema 10/29/2010   Gait difficulty    H/O: hysterectomy 10/29/2010   History of cervical cancer 10/29/2010   HTN (hypertension) 10/29/2010   Hyperlipidemia    Hypertension    Impaired glucose tolerance 08/16/2012   Loss of appetite    Memory loss    Orthostatic hypotension 06/05/2012   Osteoporosis    Right hip pain    Seizures (HCC)    Vascular dementia (HCC)    Vitamin D deficiency 09/05/2014   Past Surgical History:  Procedure Laterality Date   ABDOMINAL HYSTERECTOMY  1970   BREAST BIOPSY  1980's   benign   CATARACT EXTRACTION W/ INTRAOCULAR LENS  IMPLANT, BILATERAL  01/2011   EYE SURGERY     Family History  Problem Relation Age of Onset   Hypertension Mother    Hypertension Father    Diabetes Father    High blood pressure Daughter        x2   Colon cancer Neg Hx    Colon polyps Neg Hx    Kidney disease Neg Hx    Gallbladder disease Neg Hx    Social History   Socioeconomic History   Marital status: Divorced    Spouse name: Not on file   Number of children: 3   Years of education: 2   Highest education level: Not on file  Occupational History   Occupation: retired Runner, broadcasting/film/videoteacher  Tobacco Use   Smoking status: Former    Types: Cigarettes    Quit date: 07/13/1971    Years since quitting: 49.6   Smokeless tobacco: Never   Tobacco comments:    quit in 1973  Vaping Use   Vaping Use: Never used  Substance and Sexual Activity   Alcohol use: No    Alcohol/week: 0.0 standard  drinks   Drug use: No   Sexual activity: Never  Other Topics Concern   Not on file  Social History Narrative   Patient lives at home with two daughters Teresa Riley(Teresa Riley) Teresa Riley(Teresa Riley). Patient is retired. Patient has two years college.   Social Determinants of Health   Financial Resource Strain: Low Risk    Difficulty of Paying Living Expenses: Not hard at all  Food Insecurity: No Food Insecurity   Worried About Programme researcher, broadcasting/film/videounning Out of Food in the Last Year: Never true   Ran Out of Food in the Last Year: Never true  Transportation Needs: No Transportation Needs   Lack of Transportation (Medical): No   Lack of Transportation (Non-Medical): No  Physical Activity: Inactive   Days of Exercise per Week: 0 days  Minutes of Exercise per Session: 0 min  Stress: No Stress Concern Present   Feeling of Stress : Not at all  Social Connections: Socially Isolated   Frequency of Communication with Friends and Family: More than three times a week   Frequency of Social Gatherings with Friends and Family: More than three times a week   Attends Religious Services: Never   Database administrator or Organizations: No   Attends Banker Meetings: Never   Marital Status: Widowed    Tobacco Counseling Counseling given: Not Answered Tobacco comments: quit in 1973   Clinical Intake:  Pre-visit preparation completed: Yes  Pain : No/denies pain     Nutritional Risks: None Diabetes: No  How often do you need to have someone help you when you read instructions, pamphlets, or other written materials from your doctor or pharmacy?: 1 - Never What is the last grade level you completed in school?: HSG; 2 years of college  Diabetic? no  Interpreter Needed?: No  Information entered by :: Susie Cassette, LPN   Activities of Daily Living In your present state of health, do you have any difficulty performing the following activities: 02/24/2021 06/27/2020  Hearing? N N  Vision? N N   Difficulty concentrating or making decisions? N N  Walking or climbing stairs? Y N  Dressing or bathing? N N  Doing errands, shopping? Y N  Preparing Food and eating ? N -  Using the Toilet? N -  In the past six months, have you accidently leaked urine? N -  Do you have problems with loss of bowel control? N -  Managing your Medications? N -  Managing your Finances? N -  Housekeeping or managing your Housekeeping? N -  Some recent data might be hidden    Patient Care Team: Corwin Levins, MD as PCP - General (Internal Medicine)  Indicate any recent Medical Services you may have received from other than [facility] providers in the past year (date may be approximate).     Assessment:   This is a routine wellness examination for Angelique.  Hearing/Vision screen Hearing Screening - Comments:: Patient denied any hearing difficulty.  No hearing aids needed. Vision Screening - Comments:: Patient wears glasses.  Dietary issues and exercise activities discussed: Current Exercise Habits: The patient does not participate in regular exercise at present   Goals Addressed   None   Depression Screen PHQ 2/9 Scores 02/24/2021 06/27/2020 11/12/2019 11/12/2019 10/17/2019 07/13/2018 10/06/2016  PHQ - 2 Score 0 0 0 0 1 0 0  PHQ- 9 Score - 0 0 - - - -    Fall Risk Fall Risk  02/24/2021 11/12/2019 10/17/2019 07/13/2018 10/06/2016  Falls in the past year? 0 0 0 1 No  Number falls in past yr: 0 0 - 0 -  Comment - - - - -  Injury with Fall? 0 0 - 0 -  Risk for fall due to : No Fall Risks No Fall Risks - - -  Follow up Falls evaluation completed - - - -    FALL RISK PREVENTION PERTAINING TO THE HOME:  Any stairs in or around the home? Yes  If so, are there any without handrails? No  Home free of loose throw rugs in walkways, pet beds, electrical cords, etc? Yes  Adequate lighting in your home to reduce risk of falls? Yes   ASSISTIVE DEVICES UTILIZED TO PREVENT FALLS:  Life alert? No  Use of a cane, [place] or  w/c? Yes  Grab bars in the bathroom? Yes  Shower chair or bench in shower? Yes  Elevated toilet seat or a handicapped toilet? No   TIMED UP AND GO:  Was the test performed? No .  Length of time to ambulate 10 feet: n/a sec.   Gait slow and steady with assistive device (patient uses a cane for unsteady gait)  Cognitive Function: MMSE - Mini Mental State Exam 08/10/2018 08/15/2017 03/31/2017 09/27/2016 02/24/2016  Orientation to time 3 5 4 4 4   Orientation to Place 5 5 5 5 5   Registration 3 3 3 3 3   Attention/ Calculation 3 0 5 5 5   Recall 2 3 2 1 2   Language- name 2 objects 2 2 2 2 2   Language- repeat 1 1 1 1 1   Language- follow 3 step command 3 3 3 3 3   Language- read & follow direction 1 1 1 1 1   Write a sentence 1 1 1 1 1   Copy design 1 0 1 1 1   Total score 25 24 28 27 28         Immunizations Immunization History  Administered Date(s) Administered   DTaP 07/13/2007   Fluad Quad(high Dose 65+) 04/26/2019, 06/27/2020   Influenza Split 06/25/2011   Influenza, High Dose Seasonal PF 05/27/2015, 05/28/2016, 04/28/2017, 05/02/2018   Influenza, Seasonal, Injecte, Preservative Fre 07/11/2012   Influenza,inj,Quad PF,6+ Mos 04/04/2013   Influenza-Unspecified 03/12/2014, 04/11/2018   PFIZER(Purple Top)SARS-COV-2 Vaccination 09/22/2019, 10/16/2019   PPD Test 01/19/2011   Pneumococcal Conjugate-13 07/12/2001, 10/02/2013   Pneumococcal Polysaccharide-23 10/29/2010   Varicella 02/10/2008    TDAP status: Due, Education has been provided regarding the importance of this vaccine. Advised may receive this vaccine at local pharmacy or Health Dept. Aware to provide a copy of the vaccination record if obtained from local pharmacy or Health Dept. Verbalized acceptance and understanding.  Flu Vaccine status: Up to date  Pneumococcal vaccine status: Up to date  Covid-19 vaccine status: Completed vaccines  Qualifies for Shingles Vaccine? Yes   Zostavax completed No   Shingrix Completed?:  No.    Education has been provided regarding the importance of this vaccine. Patient has been advised to call insurance company to determine out of pocket expense if they have not yet received this vaccine. Advised may also receive vaccine at local pharmacy or Health Dept. Verbalized acceptance and understanding.  Screening Tests Health Maintenance  Topic Date Due   Zoster Vaccines- Shingrix (1 of 2) Never done   TETANUS/TDAP  07/12/2018   COVID-19 Vaccine (3 - Booster for 07/13/2012 series) 03/17/2020   INFLUENZA VACCINE  02/09/2021   DEXA SCAN  Completed   PNA vac Low Risk Adult  Completed   HPV VACCINES  Aged Out    Health Maintenance  Health Maintenance Due  Topic Date Due   Zoster Vaccines- Shingrix (1 of 2) Never done   TETANUS/TDAP  07/12/2018   COVID-19 Vaccine (3 - Booster for Pfizer series) 03/17/2020   INFLUENZA VACCINE  02/09/2021    Colorectal cancer screening: No longer required.   Mammogram status: No longer required due to age/patient refusal.  Bone Density status: Completed 04/23/2016. Results reflect: Bone density results: OSTEOPOROSIS. Repeat every 2 years.  Lung Cancer Screening: (Low Dose CT Chest recommended if Age 33-80 years, 30 pack-year currently smoking OR have quit w/in 15years.) does not qualify.   Lung Cancer Screening Referral: no  Additional Screening:  Hepatitis C Screening: does not qualify; Completed no  Vision Screening: Recommended annual  ophthalmology exams for early detection of glaucoma and other disorders of the eye. Is the patient up to date with their annual eye exam?  No  Who is the provider or what is the name of the office in which the patient attends annual eye exams? N/A  If pt is not established with a provider, would they like to be referred to a provider to establish care? No .   Dental Screening: Recommended annual dental exams for proper oral hygiene  Community Resource Referral / Chronic Care Management: CRR required this  visit?  No   CCM required this visit?  No      Plan:     I have personally reviewed and noted the following in the patient's chart:   Medical and social history Use of alcohol, tobacco or illicit drugs  Current medications and supplements including opioid prescriptions.  Functional ability and status Nutritional status Physical activity Advanced directives List of other physicians Hospitalizations, surgeries, and ER visits in previous 12 months Vitals Screenings to include cognitive, depression, and falls Referrals and appointments  In addition, I have reviewed and discussed with patient certain preventive protocols, quality metrics, and best practice recommendations. A written personalized care plan for preventive services as well as general preventive health recommendations were provided to patient.     Mickeal Needy, LPN   [id number]   Nurse Notes: n/a

## 2021-02-24 NOTE — Patient Instructions (Signed)
Teresa Riley , Thank you for taking time to come for your Medicare Wellness Visit. I appreciate your ongoing commitment to your health goals. Please review the following plan we discussed and let me know if I can assist you in the future.   Screening recommendations/referrals: Colonoscopy: Not a candidate for screening due to age Mammogram: Not a candidate for screening due to age Bone Density: Last done 04/23/2016; patient gets Prolia injections every 6 months Recommended yearly ophthalmology/optometry visit for glaucoma screening and checkup Recommended yearly dental visit for hygiene and checkup  Vaccinations: Influenza vaccine: 06/27/2020; due Fall 2022 Pneumococcal vaccine: 10/29/2010, 10/02/2013 Tdap vaccine: declined Shingles vaccine: declined   Covid-19: 09/22/2019, 10/16/2019  Advanced directives: Advance directive discussed with you today. Even though you declined this today please call our office should you change your mind and we can give you the proper paperwork for you to fill out.  Conditions/risks identified: Yes; no goals at this time.  Next appointment: Please schedule your next Medicare Wellness Visit with your Nurse Health Advisor in 1 year by calling [phone].   Preventive Care 84 Years and Older, Female Preventive care refers to lifestyle choices and visits with your health care provider that can promote health and wellness. What does preventive care include? A yearly physical exam. This is also called an annual well check. Dental exams once or twice a year. Routine eye exams. Ask your health care provider how often you should have your eyes checked. Personal lifestyle choices, including: Daily care of your teeth and gums. Regular physical activity. Eating a healthy diet. Avoiding tobacco and drug use. Limiting alcohol use. Practicing safe sex. Taking low-dose aspirin every day. Taking vitamin and mineral supplements as recommended by your health care  provider. What happens during an annual well check? The services and screenings done by your health care provider during your annual well check will depend on your age, overall health, lifestyle risk factors, and family history of disease. Counseling  Your health care provider may ask you questions about your: Alcohol use. Tobacco use. Drug use. Emotional well-being. Home and relationship well-being. Sexual activity. Eating habits. History of falls. Memory and ability to understand (cognition). Work and work Astronomer. Reproductive health. Screening  You may have the following tests or measurements: Height, weight, and BMI. Blood pressure. Lipid and cholesterol levels. These may be checked every 5 years, or more frequently if you are over 89 years old. Skin check. Lung cancer screening. You may have this screening every year starting at age 62 if you have a 30-pack-year history of smoking and currently smoke or have quit within the past 15 years. Fecal occult blood test (FOBT) of the stool. You may have this test every year starting at age 39. Flexible sigmoidoscopy or colonoscopy. You may have a sigmoidoscopy every 5 years or a colonoscopy every 10 years starting at age 52. Hepatitis C blood test. Hepatitis B blood test. Sexually transmitted disease (STD) testing. Diabetes screening. This is done by checking your blood sugar (glucose) after you have not eaten for a while (fasting). You may have this done every 1-3 years. Bone density scan. This is done to screen for osteoporosis. You may have this done starting at age 29. Mammogram. This may be done every 1-2 years. Talk to your health care provider about how often you should have regular mammograms. Talk with your health care provider about your test results, treatment options, and if necessary, the need for more tests. Vaccines  Your health care provider  may recommend certain vaccines, such as: Influenza vaccine. This is  recommended every year. Tetanus, diphtheria, and acellular pertussis (Tdap, Td) vaccine. You may need a Td booster every 10 years. Zoster vaccine. You may need this after age 89. Pneumococcal 13-valent conjugate (PCV13) vaccine. One dose is recommended after age 52. Pneumococcal polysaccharide (PPSV23) vaccine. One dose is recommended after age 67. Talk to your health care provider about which screenings and vaccines you need and how often you need them. This information is not intended to replace advice given to you by your health care provider. Make sure you discuss any questions you have with your health care provider. Document Released: 07/25/2015 Document Revised: 03/17/2016 Document Reviewed: 04/29/2015 Elsevier Interactive Patient Education  2017 [place] Prevention in the Home Falls can cause injuries. They can happen to people of all ages. There are many things you can do to make your home safe and to help prevent falls. What can I do on the outside of my home? Regularly fix the edges of walkways and driveways and fix any cracks. Remove anything that might make you trip as you walk through a door, such as a raised step or threshold. Trim any bushes or trees on the path to your home. Use bright outdoor lighting. Clear any walking paths of anything that might make someone trip, such as rocks or tools. Regularly check to see if handrails are loose or broken. Make sure that both sides of any steps have handrails. Any raised decks and porches should have guardrails on the edges. Have any leaves, snow, or ice cleared regularly. Use sand or salt on walking paths during winter. Clean up any spills in your garage right away. This includes oil or grease spills. What can I do in the bathroom? Use night lights. Install grab bars by the toilet and in the tub and shower. Do not use towel bars as grab bars. Use non-skid mats or decals in the tub or shower. If you need to sit down in  the shower, use a plastic, non-slip stool. Keep the floor dry. Clean up any water that spills on the floor as soon as it happens. Remove soap buildup in the tub or shower regularly. Attach bath mats securely with double-sided non-slip rug tape. Do not have throw rugs and other things on the floor that can make you trip. What can I do in the bedroom? Use night lights. Make sure that you have a light by your bed that is easy to reach. Do not use any sheets or blankets that are too big for your bed. They should not hang down onto the floor. Have a firm chair that has side arms. You can use this for support while you get dressed. Do not have throw rugs and other things on the floor that can make you trip. What can I do in the kitchen? Clean up any spills right away. Avoid walking on wet floors. Keep items that you use a lot in easy-to-reach places. If you need to reach something above you, use a strong step stool that has a grab bar. Keep electrical cords out of the way. Do not use floor polish or wax that makes floors slippery. If you must use wax, use non-skid floor wax. Do not have throw rugs and other things on the floor that can make you trip. What can I do with my stairs? Do not leave any items on the stairs. Make sure that there are handrails on both sides  of the stairs and use them. Fix handrails that are broken or loose. Make sure that handrails are as long as the stairways. Check any carpeting to make sure that it is firmly attached to the stairs. Fix any carpet that is loose or worn. Avoid having throw rugs at the top or bottom of the stairs. If you do have throw rugs, attach them to the floor with carpet tape. Make sure that you have a light switch at the top of the stairs and the bottom of the stairs. If you do not have them, ask someone to add them for you. What else can I do to help prevent falls? Wear shoes that: Do not have high heels. Have rubber bottoms. Are comfortable  and fit you well. Are closed at the toe. Do not wear sandals. If you use a stepladder: Make sure that it is fully opened. Do not climb a closed stepladder. Make sure that both sides of the stepladder are locked into place. Ask someone to hold it for you, if possible. Clearly mark and make sure that you can see: Any grab bars or handrails. First and last steps. Where the edge of each step is. Use tools that help you move around (mobility aids) if they are needed. These include: Canes. Walkers. Scooters. Crutches. Turn on the lights when you go into a dark area. Replace any light bulbs as soon as they burn out. Set up your furniture so you have a clear path. Avoid moving your furniture around. If any of your floors are uneven, fix them. If there are any pets around you, be aware of where they are. Review your medicines with your doctor. Some medicines can make you feel dizzy. This can increase your chance of falling. Ask your doctor what other things that you can do to help prevent falls. This information is not intended to replace advice given to you by your health care provider. Make sure you discuss any questions you have with your health care provider. Document Released: 04/24/2009 Document Revised: 12/04/2015 Document Reviewed: 08/02/2014 Elsevier Interactive Patient Education  2017 [employer].

## 2021-03-05 ENCOUNTER — Other Ambulatory Visit: Payer: Self-pay

## 2021-03-05 MED ORDER — LISINOPRIL 40 MG PO TABS: 40.0000 mg | ORAL_TABLET | Freq: Every day | ORAL | 1 refills | Status: DC

## 2021-03-05 NOTE — Telephone Encounter (Signed)
Last OV 06/27/20. Upcoming visit on 03/23/21

## 2021-03-09 ENCOUNTER — Telehealth: Payer: Self-pay

## 2021-03-09 NOTE — Telephone Encounter (Signed)
PA completed in Cover My Meds Key [id number] Outcome - "available without authorization"  [facility] Medical Precertification Request Form completed & faxed to [phone]

## 2021-03-23 ENCOUNTER — Encounter: Payer: Self-pay | Admitting: Internal Medicine

## 2021-03-23 ENCOUNTER — Other Ambulatory Visit: Payer: Self-pay

## 2021-03-23 ENCOUNTER — Other Ambulatory Visit: Payer: Self-pay | Admitting: Internal Medicine

## 2021-03-23 ENCOUNTER — Ambulatory Visit (INDEPENDENT_AMBULATORY_CARE_PROVIDER_SITE_OTHER): Payer: Medicare HMO | Admitting: Internal Medicine

## 2021-03-23 VITALS — BP 158/80 | HR 73 | Temp 98.9°F | Ht 64.0 in | Wt 199.0 lb

## 2021-03-23 DIAGNOSIS — Z23 Encounter for immunization: Secondary | ICD-10-CM | POA: Diagnosis not present

## 2021-03-23 DIAGNOSIS — E559 Vitamin D deficiency, unspecified: Secondary | ICD-10-CM | POA: Diagnosis not present

## 2021-03-23 DIAGNOSIS — E7800 Pure hypercholesterolemia, unspecified: Secondary | ICD-10-CM

## 2021-03-23 DIAGNOSIS — F0390 Unspecified dementia without behavioral disturbance: Secondary | ICD-10-CM

## 2021-03-23 DIAGNOSIS — Z0001 Encounter for general adult medical examination with abnormal findings: Secondary | ICD-10-CM | POA: Diagnosis not present

## 2021-03-23 DIAGNOSIS — R739 Hyperglycemia, unspecified: Secondary | ICD-10-CM

## 2021-03-23 DIAGNOSIS — I10 Essential (primary) hypertension: Secondary | ICD-10-CM | POA: Diagnosis not present

## 2021-03-23 DIAGNOSIS — N183 Chronic kidney disease, stage 3 unspecified: Secondary | ICD-10-CM | POA: Diagnosis not present

## 2021-03-23 DIAGNOSIS — E538 Deficiency of other specified B group vitamins: Secondary | ICD-10-CM | POA: Diagnosis not present

## 2021-03-23 LAB — LIPID PANEL
Cholesterol: 193 mg/dL (ref 0–200)
HDL: 40.7 mg/dL (ref >39.00)
NonHDL: 152.65
Total CHOL/HDL Ratio: 5
Triglycerides: 217 mg/dL — ABNORMAL HIGH (ref 0.0–149.0)
VLDL: 43.4 mg/dL — ABNORMAL HIGH (ref 0.0–40.0)

## 2021-03-23 LAB — CBC WITH DIFFERENTIAL/PLATELET
Basophils Absolute: 0 10*3/uL (ref 0.0–0.1)
Basophils Relative: 0.5 % (ref 0.0–3.0)
Eosinophils Absolute: 0.1 10*3/uL (ref 0.0–0.7)
Eosinophils Relative: 2.2 % (ref 0.0–5.0)
HCT: 38.7 % (ref 36.0–46.0)
Hemoglobin: 12.6 g/dL (ref 12.0–15.0)
Lymphocytes Relative: 42.1 % (ref 12.0–46.0)
Lymphs Abs: 2.1 10*3/uL (ref 0.7–4.0)
MCHC: 32.7 g/dL (ref 30.0–36.0)
MCV: 93.3 fl (ref 78.0–100.0)
Monocytes Absolute: 0.5 10*3/uL (ref 0.1–1.0)
Monocytes Relative: 10.1 % (ref 3.0–12.0)
Neutro Abs: 2.3 10*3/uL (ref 1.4–7.7)
Neutrophils Relative %: 45.1 % (ref 43.0–77.0)
Platelets: 255 10*3/uL (ref 150.0–400.0)
RBC: 4.15 Mil/uL (ref 3.87–5.11)
RDW: 13.8 % (ref 11.5–15.5)
WBC: 5.1 10*3/uL (ref 4.0–10.5)

## 2021-03-23 LAB — HEPATIC FUNCTION PANEL
ALT: 13 U/L (ref 0–35)
AST: 20 U/L (ref 0–37)
Albumin: 3.9 g/dL (ref 3.5–5.2)
Alkaline Phosphatase: 138 U/L — ABNORMAL HIGH (ref 39–117)
Bilirubin, Direct: 0.1 mg/dL (ref 0.0–0.3)
Total Bilirubin: 0.7 mg/dL (ref 0.2–1.2)
Total Protein: 7.6 g/dL (ref 6.0–8.3)

## 2021-03-23 LAB — BASIC METABOLIC PANEL
BUN: 13 mg/dL (ref 6–23)
CO2: 27 mEq/L (ref 19–32)
Calcium: 9.7 mg/dL (ref 8.4–10.5)
Chloride: 106 mEq/L (ref 96–112)
Creatinine, Ser: 0.92 mg/dL (ref 0.40–1.20)
GFR: 57.29 mL/min — ABNORMAL LOW (ref >60.00)
Glucose, Bld: 89 mg/dL (ref 70–99)
Potassium: 3.8 mEq/L (ref 3.5–5.1)
Sodium: 140 mEq/L (ref 135–145)

## 2021-03-23 LAB — LDL CHOLESTEROL, DIRECT: Direct LDL: 123 mg/dL

## 2021-03-23 LAB — TSH: TSH: 1.58 u[IU]/mL (ref 0.35–5.50)

## 2021-03-23 LAB — HEMOGLOBIN A1C: Hgb A1c MFr Bld: 6.8 % — ABNORMAL HIGH (ref 4.6–6.5)

## 2021-03-23 LAB — VITAMIN D 25 HYDROXY (VIT D DEFICIENCY, FRACTURES): VITD: 34.72 ng/mL (ref 30.00–100.00)

## 2021-03-23 LAB — VITAMIN B12: Vitamin B-12: 425 pg/mL (ref 211–911)

## 2021-03-23 MED ORDER — ATORVASTATIN CALCIUM 20 MG PO TABS: 20.0000 mg | ORAL_TABLET | Freq: Every day | ORAL | 3 refills | Status: DC

## 2021-03-23 NOTE — Progress Notes (Signed)
Patient ID: Teresa Riley, female   DOB: 01-Feb-1935, 86 y.o.   MRN: [id number]         Chief Complaint:: wellness exam and htn, vit df def, hld, hyperglycemia, ckd, dementia       HPI:  Teresa Riley is a 86 y.o. female here alone for wellness exam; overall doing ok but decliens covid booster shingrix, and tdap, o/w up to date with preventive referrals and immunizations                        Also BP at home most often < 140/90, not taking Vit D.  Pt denies chest pain, increased sob or doe, wheezing, orthopnea, PND, increased LE swelling, palpitations, dizziness or syncope.   Pt denies polydipsia, polyuria, or new focal neuro s/s.   Pt denies fever, wt loss, night sweats, loss of appetite, or other constitutional symptoms  Dementia overall stable symptomatically, and not assoc with behavioral changes such as hallucinations, paranoia, or agitation.     Wt Readings from Last 3 Encounters:  03/23/21 199 lb (90.3 kg)  06/27/20 193 lb (87.5 kg)  01/09/20 178 lb (80.7 kg)   BP Readings from Last 3 Encounters:  03/23/21 (!) 158/80  06/27/20 130/82  12/28/19 (!) 150/82   Immunization History  Administered Date(s) Administered   DTaP 07/13/2007   Fluad Quad(high Dose 65+) 04/26/2019, 06/27/2020, 03/23/2021   Influenza Split 06/25/2011   Influenza, High Dose Seasonal PF 05/27/2015, 05/28/2016, 04/28/2017, 05/02/2018   Influenza, Seasonal, Injecte, Preservative Fre 07/11/2012   Influenza,inj,Quad PF,6+ Mos 04/04/2013   Influenza-Unspecified 03/12/2014, 04/11/2018   PFIZER(Purple Top)SARS-COV-2 Vaccination 09/22/2019, 10/16/2019   PPD Test 01/19/2011   Pneumococcal Conjugate-13 07/12/2001, 10/02/2013   Pneumococcal Polysaccharide-23 10/29/2010   Varicella 02/10/2008   There are no preventive care reminders to display for this patient.     Past Medical History:  Diagnosis Date   Allergic rhinitis 09/05/2014   Allergic rhinitis, cause unspecified 10/29/2010   Chronic constipation 10/29/2010    CKD (chronic kidney disease) stage 3, GFR 30-59 ml/min (HCC) 04/28/2017   Eczema 10/29/2010   Gait difficulty    H/O: hysterectomy 10/29/2010   History of cervical cancer 10/29/2010   HTN (hypertension) 10/29/2010   Hyperlipidemia    Hypertension    Impaired glucose tolerance 08/16/2012   Loss of appetite    Memory loss    Orthostatic hypotension 06/05/2012   Osteoporosis    Right hip pain    Seizures (HCC)    Vascular dementia (HCC)    Vitamin D deficiency 09/05/2014   Past Surgical History:  Procedure Laterality Date   ABDOMINAL HYSTERECTOMY  1970   BREAST BIOPSY  1980's   benign   CATARACT EXTRACTION W/ INTRAOCULAR LENS  IMPLANT, BILATERAL  01/2011   EYE SURGERY      reports that she quit smoking about 49 years ago. Her smoking use included cigarettes. She has never used smokeless tobacco. She reports that she does not drink alcohol and does not use drugs. family history includes Diabetes in her father; High blood pressure in her daughter; Hypertension in her father and mother. No Known Allergies Current Outpatient Medications on File Prior to Visit  Medication Sig Dispense Refill   acetaminophen (TYLENOL) 325 MG tablet Take 325-650 mg by mouth 2 (two) times daily as needed (eye pain).     albuterol (PROVENTIL) (2.5 MG/3ML) 0.083% nebulizer solution Take 3 mLs (2.5 mg total) by nebulization every 6 (six) hours as needed  for wheezing or shortness of breath. 150 mL 1   ALPRAZolam (XANAX) 0.25 MG tablet TAKE 1 TABLET(0.25 MG) BY MOUTH TWICE DAILY AS NEEDED 60 tablet 2   amLODipine (NORVASC) 10 MG tablet TAKE 1 TABLET(10 MG) BY MOUTH DAILY 90 tablet 3   aspirin EC 81 MG tablet Take 81 mg by mouth daily.      Calcium-Magnesium-Vitamin D (CALCIUM MAGNESIUM PO) Take 1 tablet by mouth daily with lunch.      citalopram (CELEXA) 10 MG tablet TAKE 1 TABLET(10 MG) BY MOUTH DAILY 90 tablet 3   denosumab (PROLIA) 60 MG/ML SOSY injection Inject 60 mg into the skin every 6 (six) months.      fluocinonide cream (LIDEX) 0.05 % Apply topically 2 (two) times daily. 30 g 1   fluticasone (FLONASE) 50 MCG/ACT nasal spray Place 1 spray into both nostrils daily.     guaiFENesin (MUCINEX) 600 MG 12 hr tablet Take 2 tablets (1,200 mg total) by mouth 2 (two) times daily as needed. 120 tablet 1   HYDROcodone-acetaminophen (NORCO/VICODIN) 5-325 MG tablet Take 1 tablet by mouth every 6 (six) hours as needed for moderate pain. 30 tablet 0   hydrocortisone cream 1 % Apply 1 application topically 2 (two) times daily as needed for itching (eczema/rash).     labetalol (NORMODYNE) 300 MG tablet TAKE 1 TABLET(300 MG) BY MOUTH TWICE DAILY 180 tablet 3   Liraglutide -Weight Management (SAXENDA) 18 MG/3ML SOPN Inject 0.1 mLs (0.6 mg total) into the skin once a week. 3 mL 3   lisinopril (ZESTRIL) 40 MG tablet Take 1 tablet (40 mg total) by mouth daily. 90 tablet 1   loratadine (CLARITIN) 10 MG tablet Take 10 mg by mouth daily as needed for allergies or rhinitis.      Memantine HCl-Donepezil HCl (NAMZARIC) 28-10 MG CP24 TAKE 1 CAPSULE BY MOUTH AT BEDTIME 30 capsule 0   Misc. Devices (CANE) MISC Use as directed daily; 1 each 0   Multiple Vitamins-Minerals (SUPER THERA VITE M) TABS Take by mouth.     Omega-3 Fatty Acids (OMEGA 3 PO) Take 1 capsule by mouth daily with lunch.      Polyethyl Glycol-Propyl Glycol (SYSTANE OP) Place 1 drop into both eyes daily as needed (dry eyes).      QUEtiapine (SEROQUEL) 50 MG tablet TAKE 1 TABLET(50 MG) BY MOUTH AT BEDTIME 90 tablet 1   No current facility-administered medications on file prior to visit.        ROS:  All others reviewed and negative.  Objective        PE:  BP (!) 158/80 (BP Location: Left Arm, Patient Position: Sitting, Cuff Size: Large)   Pulse 73   Temp 98.9 F (37.2 C) (Oral)   Ht 5\' 4"  (1.626 m)   Wt 199 lb (90.3 kg)   SpO2 96%   BMI 34.16 kg/m                 Constitutional: Pt appears in NAD               HENT: Head: NCAT.                 Right Ear: External ear normal.                 Left Ear: External ear normal.                Eyes: . Pupils are equal, round, and reactive to light. Conjunctivae and  EOM are normal               Nose: without d/c or deformity               Neck: Neck supple. Gross normal ROM               Cardiovascular: Normal rate and regular rhythm.                 Pulmonary/Chest: Effort normal and breath sounds without rales or wheezing.                Abd:  Soft, NT, ND, + BS, no organomegaly               Neurological: Pt is alert. At baseline orientation, motor grossly intact               Skin: Skin is warm. No rashes, no other new lesions, LE edema - none               Psychiatric: Pt behavior is normal without agitation   Micro: none  Cardiac tracings I have personally interpreted today:  none  Pertinent Radiological findings (summarize): none   Lab Results  Component Value Date   WBC 5.1 03/23/2021   HGB 12.6 03/23/2021   HCT 38.7 03/23/2021   PLT 255.0 03/23/2021   GLUCOSE 89 03/23/2021   CHOL 193 03/23/2021   TRIG 217.0 (H) 03/23/2021   HDL 40.70 03/23/2021   LDLDIRECT 123.0 03/23/2021   LDLCALC 98 11/16/2019   ALT 13 03/23/2021   AST 20 03/23/2021   NA 140 03/23/2021   K 3.8 03/23/2021   CL 106 03/23/2021   CREATININE 0.92 03/23/2021   BUN 13 03/23/2021   CO2 27 03/23/2021   TSH 1.58 03/23/2021   HGBA1C 6.8 (H) 03/23/2021   Assessment/Plan:  Teresa Riley DoneLucy M Riley is a 86 y.o. Black or African American [2] female with  has a past medical history of Allergic rhinitis (09/05/2014), Allergic rhinitis, cause unspecified (10/29/2010), Chronic constipation (10/29/2010), CKD (chronic kidney disease) stage 3, GFR 30-59 ml/min (HCC) (04/28/2017), Eczema (10/29/2010), Gait difficulty, H/O: hysterectomy (10/29/2010), History of cervical cancer (10/29/2010), HTN (hypertension) (10/29/2010), Hyperlipidemia, Hypertension, Impaired glucose tolerance (08/16/2012), Loss of appetite, Memory loss, Orthostatic  hypotension (06/05/2012), Osteoporosis, Right hip pain, Seizures (HCC), Vascular dementia (HCC), and Vitamin D deficiency (09/05/2014).  Vitamin D deficiency Last vitamin D Lab Results  Component Value Date   VD25OH 34.72 03/23/2021   Low normal, to start oral replacement  Encounter for well adult exam with abnormal findings Age and sex appropriate education and counseling updated with regular exercise and diet Referrals for preventative services - none needed Immunizations addressed - declines covid booster, shingrix, tdap Smoking counseling  - none needed Evidence for depression or other mood disorder - none significant Most recent labs reviewed. I have personally reviewed and have noted: 1) the patient's medical and social history 2) The patient's current medications and supplements 3) The patient's height, weight, and BMI have been recorded in the chart   Hyperlipidemia Lab Results  Component Value Date   LDLCALC 98 11/16/2019   Goal ldl < `00, pt to continue current statin liptior 20   Hyperglycemia Lab Results  Component Value Date   HGBA1C 6.8 (H) 03/23/2021   Stable, pt to continue current medical treatment  - diet   HTN (hypertension) BP Readings from Last 3 Encounters:  03/23/21 (!) 158/80  06/27/20 130/82  12/28/19 (!) 150/82  Mild uncontrolled here today, states BP at home < 140/90, pt to continue medical treatment norvasc, normodyne, zestril as decliens change today   CKD (chronic kidney disease) stage 3, GFR 30-59 ml/min (HCC) Lab Results  Component Value Date   CREATININE 0.92 03/23/2021   Stable overall, cont to avoid nephrotoxins   Dementia (HCC) .overall stable, cont same tx - namzaric, seroquel  Followup: Return in about 6 months (around 09/20/2021).  Oliver Barre, MD 03/29/2021 10:48 AM [facility] Internal Medicine

## 2021-03-23 NOTE — Patient Instructions (Addendum)
Please consider the new COVID vaccine from novavax next month  Please take OTC Vitamin D3 at 2000 units per day, indefinitely  Please continue all other medications as before, and refills have been done if requested.  Please have the pharmacy call with any other refills you may need.  Please continue your efforts at being more active, low cholesterol diet, and weight control.  You are otherwise up to date with prevention measures today.  Please keep your appointments with your specialists as you may have planned  Please go to the LAB at the blood drawing area for the tests to be done  You will be contacted by phone if any changes need to be made immediately.  Otherwise, you will receive a letter about your results with an explanation, but please check with MyChart first.  Please remember to sign up for MyChart if you have not done so, as this will be important to you in the future with finding out test results, communicating by private email, and scheduling acute appointments online when needed.  Please make an Appointment to return in 6 months, or sooner if needed

## 2021-03-25 ENCOUNTER — Encounter: Payer: Self-pay | Admitting: Internal Medicine

## 2021-03-25 LAB — URINALYSIS, ROUTINE W REFLEX MICROSCOPIC
Bilirubin Urine: NEGATIVE
Hgb urine dipstick: NEGATIVE
Ketones, ur: NEGATIVE
Leukocytes,Ua: NEGATIVE
Nitrite: NEGATIVE
RBC / HPF: NONE SEEN (ref 0–?)
Specific Gravity, Urine: 1.01 (ref 1.000–1.030)
Total Protein, Urine: NEGATIVE
Urine Glucose: NEGATIVE
Urobilinogen, UA: 0.2 (ref 0.0–1.0)
pH: 7 (ref 5.0–8.0)

## 2021-03-29 ENCOUNTER — Encounter: Payer: Self-pay | Admitting: Internal Medicine

## 2021-03-29 MED ORDER — CITALOPRAM HYDROBROMIDE 10 MG PO TABS: ORAL_TABLET | ORAL | 3 refills | Status: DC

## 2021-03-29 MED ORDER — AMLODIPINE BESYLATE 10 MG PO TABS: ORAL_TABLET | ORAL | 3 refills | Status: DC

## 2021-03-29 MED ORDER — ALBUTEROL SULFATE (2.5 MG/3ML) 0.083% IN NEBU: 2.5000 mg | INHALATION_SOLUTION | Freq: Four times a day (QID) | RESPIRATORY_TRACT | 1 refills | Status: AC | PRN

## 2021-03-29 MED ORDER — NAMZARIC 28-10 MG PO CP24: 1.0000 | ORAL_CAPSULE | Freq: Every day | ORAL | 3 refills | Status: DC

## 2021-03-29 MED ORDER — ATORVASTATIN CALCIUM 20 MG PO TABS: 20.0000 mg | ORAL_TABLET | Freq: Every day | ORAL | 3 refills | Status: DC

## 2021-03-29 MED ORDER — LABETALOL HCL 300 MG PO TABS: ORAL_TABLET | ORAL | 3 refills | Status: DC

## 2021-03-29 MED ORDER — ALPRAZOLAM 0.25 MG PO TABS: ORAL_TABLET | ORAL | 2 refills | Status: DC

## 2021-03-29 MED ORDER — QUETIAPINE FUMARATE 50 MG PO TABS: ORAL_TABLET | ORAL | 1 refills | Status: DC

## 2021-03-29 MED ORDER — LISINOPRIL 40 MG PO TABS: 40.0000 mg | ORAL_TABLET | Freq: Every day | ORAL | 1 refills | Status: DC

## 2021-03-29 NOTE — Assessment & Plan Note (Signed)
.  overall stable, cont same tx - namzaric, seroquel

## 2021-03-29 NOTE — Assessment & Plan Note (Signed)
Age and sex appropriate education and counseling updated with regular exercise and diet Referrals for preventative services - none needed Immunizations addressed - declines covid booster, shingrix, tdap Smoking counseling  - none needed Evidence for depression or other mood disorder - none significant Most recent labs reviewed. I have personally reviewed and have noted: 1) the patient's medical and social history 2) The patient's current medications and supplements 3) The patient's height, weight, and BMI have been recorded in the chart  

## 2021-03-29 NOTE — Assessment & Plan Note (Signed)
BP Readings from Last 3 Encounters:  03/23/21 (!) 158/80  06/27/20 130/82  12/28/19 (!) 150/82   Mild uncontrolled here today, states BP at home < 140/90, pt to continue medical treatment norvasc, normodyne, zestril as decliens change today

## 2021-03-29 NOTE — Assessment & Plan Note (Signed)
Last vitamin D Lab Results  Component Value Date   VD25OH 34.72 03/23/2021   Low normal, to start oral replacement

## 2021-03-29 NOTE — Assessment & Plan Note (Signed)
Lab Results  Component Value Date   CREATININE 0.92 03/23/2021   Stable overall, cont to avoid nephrotoxins

## 2021-03-29 NOTE — Assessment & Plan Note (Signed)
Lab Results  Component Value Date   LDLCALC 98 11/16/2019   Goal ldl < `00, pt to continue current statin liptior 20

## 2021-03-29 NOTE — Assessment & Plan Note (Signed)
Lab Results  Component Value Date   HGBA1C 6.8 (H) 03/23/2021   Stable, pt to continue current medical treatment  - diet

## 2021-04-07 ENCOUNTER — Telehealth: Payer: Self-pay | Admitting: Neurology

## 2021-04-07 ENCOUNTER — Other Ambulatory Visit: Payer: Self-pay | Admitting: Emergency Medicine

## 2021-04-07 MED ORDER — NAMZARIC 28-10 MG PO CP24: 1.0000 | ORAL_CAPSULE | Freq: Every day | ORAL | 3 refills | Status: DC

## 2021-04-07 NOTE — Telephone Encounter (Signed)
Looks like pt has Medicaid as secondary, will need to update and re-run VOB.

## 2021-04-07 NOTE — Telephone Encounter (Signed)
  Prior Auth on file

## 2021-04-07 NOTE — Telephone Encounter (Signed)
Refill sent in by PCP on 03/29/21 by Corwin Levins, MD

## 2021-04-07 NOTE — Telephone Encounter (Signed)
Pt's daughter [name] called requesting refill for Memantine HCl-Donepezil HCl (NAMZARIC) 28-10 MG CP24. Pharmacy [facility] [phone].

## 2021-04-16 NOTE — Telephone Encounter (Signed)
Prior Auth required for Prolia  Primary PA PROCESS DETAILS: rior authorization is required & is currently not on file. To initiate verbally or check status please contact [phone]. Requests can be submitted online thru [employer] or at: [email or web address]  Secondary PA PROCESS DETAILS: rior authorization is required & is currently not on file. To initiate verbally or check status please contact [phone]. Requests can be submitted online thru [employer] or at: [email or web address]

## 2021-04-16 NOTE — Telephone Encounter (Signed)
error 

## 2021-04-20 ENCOUNTER — Other Ambulatory Visit: Payer: Self-pay

## 2021-04-20 ENCOUNTER — Ambulatory Visit (INDEPENDENT_AMBULATORY_CARE_PROVIDER_SITE_OTHER): Payer: Medicare HMO | Admitting: Orthopedic Surgery

## 2021-04-20 ENCOUNTER — Telehealth: Payer: Self-pay

## 2021-04-20 DIAGNOSIS — M25561 Pain in right knee: Secondary | ICD-10-CM | POA: Diagnosis not present

## 2021-04-20 DIAGNOSIS — M1711 Unilateral primary osteoarthritis, right knee: Secondary | ICD-10-CM | POA: Diagnosis not present

## 2021-04-20 NOTE — Telephone Encounter (Signed)
An we get auth for right knee gel injection

## 2021-04-21 ENCOUNTER — Encounter: Payer: Self-pay | Admitting: Orthopedic Surgery

## 2021-04-21 NOTE — Progress Notes (Signed)
Office Visit Note   Patient: Teresa Riley           Date of Birth: 12-04-1934           MRN: [id number] Visit Date: 04/20/2021 Requested by: Corwin Levins, MD [address] [place],  [place] [address] PCP: Corwin Levins, MD  Subjective: Chief Complaint  Patient presents with   Knee Pain    HPI: Teresa Riley is a 86 y.o. female who presents to the office complaining of right knee swelling.  Patient has history of right knee osteoarthritis.  She complains of discomfort in the right knee with ambulation as well as increased swelling.  Does not really have a lot of pain.  Does not take any medications for pain.  No recent falls or injuries.  She has had good relief with the right knee gel injection that she had on 12/25/2020..                ROS: All systems reviewed are negative as they relate to the chief complaint within the history of present illness.  Patient denies fevers or chills.  Assessment & Plan: Visit Diagnoses:  1. Arthritis of right knee     Plan: Patient is an 86 year old female who presents with complaint of right knee discomfort.  She has history of right knee osteoarthritis.  She has had good relief with alternating cortisone and gel injections.  Last gel injection was on 12/25/2020 that provided excellent relief.  She would like to have the knee aspirated and try cortisone injection.  Injection successfully administered and 10 cc was aspirated.  Plan for her to follow-up in mid December for repeat gel injection.   This patient is diagnosed with osteoarthritis of the knee(s).    Radiographs show evidence of joint space narrowing, osteophytes, subchondral sclerosis and/or subchondral cysts.  This patient has knee pain which interferes with functional and activities of daily living.    This patient has experienced inadequate response, adverse effects and/or intolerance with conservative treatments such as acetaminophen, NSAIDS, topical creams, physical therapy or  regular exercise, knee bracing and/or weight loss.   This patient has experienced inadequate response or has a contraindication to intra articular steroid injections for at least 3 months.   This patient is not scheduled to have a total knee replacement within 6 months of starting treatment with viscosupplementation.   Follow-Up Instructions: No follow-ups on file.   Orders:  No orders of the defined types were placed in this encounter.  No orders of the defined types were placed in this encounter.     Procedures: Large Joint Inj: R knee on 04/20/2021 12:13 PM Indications: diagnostic evaluation, joint swelling and pain Details: 18 G 1.5 in needle, superolateral approach  Arthrogram: No  Medications: 5 mL lidocaine 1 %; 40 mg methylPREDNISolone acetate 40 MG/ML; 4 mL bupivacaine 0.25 % Aspirate: 10 mL Outcome: tolerated well, no immediate complications Procedure, treatment alternatives, risks and benefits explained, specific risks discussed. Consent was given by the patient. Immediately prior to procedure a time out was called to verify the correct patient, procedure, equipment, support staff and site/side marked as required. Patient was prepped and draped in the usual sterile fashion.      Clinical Data: No additional findings.  Objective: Vital Signs: There were no vitals taken for this visit.  Physical Exam:  Constitutional: Patient appears well-developed HEENT:  Head: Normocephalic Eyes:EOM are normal Neck: Normal range of motion Cardiovascular: Normal rate Pulmonary/chest: Effort  normal Neurologic: Patient is alert Skin: Skin is warm Psychiatric: Patient has normal mood and affect  Ortho Exam: Ortho exam demonstrates right knee with small effusion.  Tenderness over the medial joint line primarily as well as lateral joint line.  Able to perform straight leg raise.  0 degrees extension and about 100 degrees of knee flexion.  No calf tenderness.  Negative Homans'  sign.  No pain with hip range of motion.  Stable to anterior and posterior drawer.  Specialty Comments:  No specialty comments available.  Imaging: No results found.   PMFS History: Patient Active Problem List   Diagnosis Date Noted   Right knee pain 11/12/2019   Pain and swelling of right lower leg 11/12/2019   Weight gain 10/17/2019   Wheezing 10/17/2019   Acute dysfunction of left eustachian tube 01/16/2019   Hyperglycemia 01/16/2019   CKD (chronic kidney disease) stage 3, GFR 30-59 ml/min (HCC) 04/28/2017   Hemorrhoids 04/28/2017   Bilateral hearing loss 11/06/2016   Headache 11/05/2016   Thrush 11/05/2016   Paresthesia 09/27/2016   Vertigo 07/04/2016   Urinary retention 06/29/2016   Gait disorder 06/29/2016   General weakness 09/16/2015   Seizures (HCC) 08/27/2015   Dry eyes 05/27/2015   Dry skin 03/12/2015   Rash and nonspecific skin eruption 03/12/2015   Esophageal dysmotility 01/15/2015   Colon cancer screening - declined 01/15/2015   Syncope and collapse 10/03/2014   Constipation 09/05/2014   Encounter for well adult exam with abnormal findings 09/05/2014   Osteoporosis 09/05/2014   Vitamin D deficiency 09/05/2014   Allergic rhinitis 09/05/2014   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   Insomnia 10/02/2013   Weight loss 04/04/2013   Localization-related focal epilepsy with simple partial seizures (HCC) 11/15/2012   Dementia (HCC) 11/15/2012   Hyperlipidemia 08/06/2012   HTN (hypertension) 10/29/2010   Eczema 10/29/2010   History of cervical cancer 10/29/2010   Past Medical History:  Diagnosis Date   Allergic rhinitis 09/05/2014   Allergic rhinitis, cause unspecified 10/29/2010   Chronic constipation 10/29/2010   CKD (chronic kidney disease) stage 3, GFR 30-59 ml/min (HCC) 04/28/2017   Eczema 10/29/2010   Gait difficulty    H/O: hysterectomy 10/29/2010   History of cervical cancer 10/29/2010   HTN (hypertension) 10/29/2010   Hyperlipidemia    Hypertension     Impaired glucose tolerance 08/16/2012   Loss of appetite    Memory loss    Orthostatic hypotension 06/05/2012   Osteoporosis    Right hip pain    Seizures (HCC)    Vascular dementia (HCC)    Vitamin D deficiency 09/05/2014    Family History  Problem Relation Age of Onset   Hypertension Mother    Hypertension Father    Diabetes Father    High blood pressure Daughter        x2   Colon cancer Neg Hx    Colon polyps Neg Hx    Kidney disease Neg Hx    Gallbladder disease Neg Hx     Past Surgical History:  Procedure Laterality Date   ABDOMINAL HYSTERECTOMY  1970   BREAST BIOPSY  1980's   benign   CATARACT EXTRACTION W/ INTRAOCULAR LENS  IMPLANT, BILATERAL  01/2011   EYE SURGERY     Social History   Occupational History   Occupation: retired Runner, broadcasting/film/video  Tobacco Use   Smoking status: Former    Types: Cigarettes    Quit date: 07/13/1971    Years since quitting: 49.8   Smokeless tobacco: Never  Tobacco comments:    quit in 1973  Vaping Use   Vaping Use: Never used  Substance and Sexual Activity   Alcohol use: No    Alcohol/week: 0.0 standard drinks   Drug use: No   Sexual activity: Never

## 2021-04-21 NOTE — Telephone Encounter (Signed)
Noted  

## 2021-04-22 ENCOUNTER — Encounter: Payer: Self-pay | Admitting: Orthopedic Surgery

## 2021-04-22 MED ORDER — METHYLPREDNISOLONE ACETATE 40 MG/ML IJ SUSP
40.0000 mg | INTRAMUSCULAR | Status: AC | PRN
  Administered 2021-04-20: 40 mg via INTRA_ARTICULAR

## 2021-04-22 MED ORDER — BUPIVACAINE HCL 0.25 % IJ SOLN
4.0000 mL | INTRAMUSCULAR | Status: AC | PRN
  Administered 2021-04-20: 4 mL via INTRA_ARTICULAR

## 2021-04-22 MED ORDER — LIDOCAINE HCL 1 % IJ SOLN
5.0000 mL | INTRAMUSCULAR | Status: AC | PRN
  Administered 2021-04-20: 5 mL

## 2021-04-24 NOTE — Telephone Encounter (Signed)
Next available gel injection would need to be after 06/26/2021, due to last gel injection being received on 12/25/2020. Will submit in November, 2022.

## 2021-05-18 NOTE — Telephone Encounter (Signed)
Pt ready for scheduling on or after 04/26/20  Out-of-pocket cost due at time of visit: $295  Primary: [employer] [place] co-insurance: 20% (approximately $255) Admin fee co-insurance: 20% (approximately $25)  Secondary: Medicaid [place] co-insurance:  Admin fee co-insurance:   Deductible: does not apply  Prior Auth: APPROVED PA# [id number] Valid: 12/21/2018-07/11/2021  ** This summary of benefits is an estimation of the patient's out-of-pocket cost. Exact cost may vary based on individual plan coverage.

## 2021-05-25 ENCOUNTER — Other Ambulatory Visit: Payer: Self-pay

## 2021-05-25 ENCOUNTER — Emergency Department (HOSPITAL_COMMUNITY): Payer: Medicare HMO

## 2021-05-25 ENCOUNTER — Encounter (HOSPITAL_COMMUNITY): Payer: Self-pay

## 2021-05-25 ENCOUNTER — Inpatient Hospital Stay (HOSPITAL_COMMUNITY)
Admission: EM | Admit: 2021-05-25 | Discharge: 2021-05-29 | DRG: 177 | Disposition: A | Discharge status: Home Health | Payer: Medicare HMO | Attending: Internal Medicine | Admitting: Internal Medicine

## 2021-05-25 ENCOUNTER — Inpatient Hospital Stay (HOSPITAL_COMMUNITY): Payer: Medicare HMO

## 2021-05-25 DIAGNOSIS — U071 COVID-19: Secondary | ICD-10-CM | POA: Diagnosis not present

## 2021-05-25 DIAGNOSIS — N183 Chronic kidney disease, stage 3 unspecified: Secondary | ICD-10-CM | POA: Diagnosis present

## 2021-05-25 DIAGNOSIS — Z833 Family history of diabetes mellitus: Secondary | ICD-10-CM | POA: Diagnosis not present

## 2021-05-25 DIAGNOSIS — J9811 Atelectasis: Secondary | ICD-10-CM | POA: Diagnosis not present

## 2021-05-25 DIAGNOSIS — J80 Acute respiratory distress syndrome: Secondary | ICD-10-CM | POA: Diagnosis not present

## 2021-05-25 DIAGNOSIS — J9600 Acute respiratory failure, unspecified whether with hypoxia or hypercapnia: Secondary | ICD-10-CM | POA: Diagnosis present

## 2021-05-25 DIAGNOSIS — N1831 Chronic kidney disease, stage 3a: Secondary | ICD-10-CM | POA: Diagnosis present

## 2021-05-25 DIAGNOSIS — J9601 Acute respiratory failure with hypoxia: Secondary | ICD-10-CM | POA: Diagnosis present

## 2021-05-25 DIAGNOSIS — Z8541 Personal history of malignant neoplasm of cervix uteri: Secondary | ICD-10-CM

## 2021-05-25 DIAGNOSIS — F0390 Unspecified dementia without behavioral disturbance: Secondary | ICD-10-CM | POA: Diagnosis present

## 2021-05-25 DIAGNOSIS — S199XXA Unspecified injury of neck, initial encounter: Secondary | ICD-10-CM | POA: Diagnosis not present

## 2021-05-25 DIAGNOSIS — E785 Hyperlipidemia, unspecified: Secondary | ICD-10-CM | POA: Diagnosis not present

## 2021-05-25 DIAGNOSIS — I517 Cardiomegaly: Secondary | ICD-10-CM | POA: Diagnosis not present

## 2021-05-25 DIAGNOSIS — R0602 Shortness of breath: Secondary | ICD-10-CM | POA: Diagnosis not present

## 2021-05-25 DIAGNOSIS — R296 Repeated falls: Secondary | ICD-10-CM | POA: Diagnosis not present

## 2021-05-25 DIAGNOSIS — R531 Weakness: Secondary | ICD-10-CM | POA: Diagnosis present

## 2021-05-25 DIAGNOSIS — F0150 Vascular dementia without behavioral disturbance: Secondary | ICD-10-CM | POA: Diagnosis not present

## 2021-05-25 DIAGNOSIS — Z79899 Other long term (current) drug therapy: Secondary | ICD-10-CM

## 2021-05-25 DIAGNOSIS — I10 Essential (primary) hypertension: Secondary | ICD-10-CM | POA: Diagnosis present

## 2021-05-25 DIAGNOSIS — M810 Age-related osteoporosis without current pathological fracture: Secondary | ICD-10-CM | POA: Diagnosis not present

## 2021-05-25 DIAGNOSIS — S0990XA Unspecified injury of head, initial encounter: Secondary | ICD-10-CM | POA: Diagnosis not present

## 2021-05-25 DIAGNOSIS — M47812 Spondylosis without myelopathy or radiculopathy, cervical region: Secondary | ICD-10-CM | POA: Diagnosis not present

## 2021-05-25 DIAGNOSIS — I129 Hypertensive chronic kidney disease with stage 1 through stage 4 chronic kidney disease, or unspecified chronic kidney disease: Secondary | ICD-10-CM | POA: Diagnosis not present

## 2021-05-25 DIAGNOSIS — E7800 Pure hypercholesterolemia, unspecified: Secondary | ICD-10-CM | POA: Diagnosis not present

## 2021-05-25 DIAGNOSIS — Z8249 Family history of ischemic heart disease and other diseases of the circulatory system: Secondary | ICD-10-CM | POA: Diagnosis not present

## 2021-05-25 DIAGNOSIS — M419 Scoliosis, unspecified: Secondary | ICD-10-CM | POA: Diagnosis not present

## 2021-05-25 DIAGNOSIS — Z87891 Personal history of nicotine dependence: Secondary | ICD-10-CM | POA: Diagnosis not present

## 2021-05-25 DIAGNOSIS — R0902 Hypoxemia: Secondary | ICD-10-CM | POA: Diagnosis not present

## 2021-05-25 DIAGNOSIS — R062 Wheezing: Secondary | ICD-10-CM | POA: Diagnosis not present

## 2021-05-25 DIAGNOSIS — I672 Cerebral atherosclerosis: Secondary | ICD-10-CM | POA: Diagnosis not present

## 2021-05-25 DIAGNOSIS — K449 Diaphragmatic hernia without obstruction or gangrene: Secondary | ICD-10-CM | POA: Diagnosis not present

## 2021-05-25 LAB — COMPREHENSIVE METABOLIC PANEL
ALT: 21 U/L (ref 0–44)
AST: 61 U/L — ABNORMAL HIGH (ref 15–41)
Albumin: 4.1 g/dL (ref 3.5–5.0)
Alkaline Phosphatase: 120 U/L (ref 38–126)
Anion gap: 12 (ref 5–15)
BUN: 23 mg/dL (ref 8–23)
CO2: 23 mmol/L (ref 22–32)
Calcium: 9.4 mg/dL (ref 8.9–10.3)
Chloride: 100 mmol/L (ref 98–111)
Creatinine, Ser: 1.17 mg/dL — ABNORMAL HIGH (ref 0.44–1.00)
GFR, Estimated: 46 mL/min — ABNORMAL LOW (ref >60)
Glucose, Bld: 145 mg/dL — ABNORMAL HIGH (ref 70–99)
Potassium: 3.7 mmol/L (ref 3.5–5.1)
Sodium: 135 mmol/L (ref 135–145)
Total Bilirubin: 0.9 mg/dL (ref 0.3–1.2)
Total Protein: 8.7 g/dL — ABNORMAL HIGH (ref 6.5–8.1)

## 2021-05-25 LAB — URINALYSIS, ROUTINE W REFLEX MICROSCOPIC
Bacteria, UA: NONE SEEN
Bilirubin Urine: NEGATIVE
Glucose, UA: NEGATIVE mg/dL
Hgb urine dipstick: NEGATIVE
Ketones, ur: 5 mg/dL — AB
Leukocytes,Ua: NEGATIVE
Nitrite: NEGATIVE
Protein, ur: 100 mg/dL — AB
Specific Gravity, Urine: 1.025 (ref 1.005–1.030)
pH: 5 (ref 5.0–8.0)

## 2021-05-25 LAB — CBC
HCT: 37.2 % (ref 36.0–46.0)
Hemoglobin: 12.1 g/dL (ref 12.0–15.0)
MCH: 30.5 pg (ref 26.0–34.0)
MCHC: 32.5 g/dL (ref 30.0–36.0)
MCV: 93.7 fL (ref 80.0–100.0)
Platelets: 273 10*3/uL (ref 150–400)
RBC: 3.97 MIL/uL (ref 3.87–5.11)
RDW: 13.8 % (ref 11.5–15.5)
WBC: 6.4 10*3/uL (ref 4.0–10.5)
nRBC: 0 % (ref 0.0–0.2)

## 2021-05-25 LAB — RESP PANEL BY RT-PCR (FLU A&B, COVID) ARPGX2
Influenza A by PCR: NEGATIVE
Influenza B by PCR: NEGATIVE
SARS Coronavirus 2 by RT PCR: POSITIVE — AB

## 2021-05-25 LAB — CBC WITH DIFFERENTIAL/PLATELET
Abs Immature Granulocytes: 0.05 10*3/uL (ref 0.00–0.07)
Basophils Absolute: 0 10*3/uL (ref 0.0–0.1)
Basophils Relative: 0 %
Eosinophils Absolute: 0 10*3/uL (ref 0.0–0.5)
Eosinophils Relative: 0 %
HCT: 38.5 % (ref 36.0–46.0)
Hemoglobin: 12.8 g/dL (ref 12.0–15.0)
Immature Granulocytes: 1 %
Lymphocytes Relative: 22 %
Lymphs Abs: 2.3 10*3/uL (ref 0.7–4.0)
MCH: 30.8 pg (ref 26.0–34.0)
MCHC: 33.2 g/dL (ref 30.0–36.0)
MCV: 92.8 fL (ref 80.0–100.0)
Monocytes Absolute: 0.8 10*3/uL (ref 0.1–1.0)
Monocytes Relative: 7 %
Neutro Abs: 7.2 10*3/uL (ref 1.7–7.7)
Neutrophils Relative %: 70 %
Platelets: 264 10*3/uL (ref 150–400)
RBC: 4.15 MIL/uL (ref 3.87–5.11)
RDW: 13.7 % (ref 11.5–15.5)
WBC: 10.3 10*3/uL (ref 4.0–10.5)
nRBC: 0 % (ref 0.0–0.2)

## 2021-05-25 LAB — CREATININE, SERUM
Creatinine, Ser: 1.13 mg/dL — ABNORMAL HIGH (ref 0.44–1.00)
GFR, Estimated: 48 mL/min — ABNORMAL LOW (ref >60)

## 2021-05-25 LAB — D-DIMER, QUANTITATIVE
D-Dimer, Quant: 5.72 ug/mL-FEU — ABNORMAL HIGH (ref 0.00–0.50)
D-Dimer, Quant: 3.47 ug/mL-FEU — ABNORMAL HIGH (ref 0.00–0.50)

## 2021-05-25 LAB — LACTATE DEHYDROGENASE: LDH: 253 U/L — ABNORMAL HIGH (ref 98–192)

## 2021-05-25 LAB — FIBRINOGEN: Fibrinogen: 543 mg/dL — ABNORMAL HIGH (ref 210–475)

## 2021-05-25 LAB — FERRITIN: Ferritin: 89 ng/mL (ref 11–307)

## 2021-05-25 LAB — TROPONIN I (HIGH SENSITIVITY): Troponin I (High Sensitivity): 11 ng/L (ref <18)

## 2021-05-25 LAB — LACTIC ACID, PLASMA
Lactic Acid, Venous: 2 mmol/L (ref 0.5–1.9)
Lactic Acid, Venous: 1.3 mmol/L (ref 0.5–1.9)

## 2021-05-25 LAB — BRAIN NATRIURETIC PEPTIDE: B Natriuretic Peptide: 123.4 pg/mL — ABNORMAL HIGH (ref 0.0–100.0)

## 2021-05-25 LAB — PROCALCITONIN
Procalcitonin: 0.21 ng/mL
Procalcitonin: 0.22 ng/mL

## 2021-05-25 LAB — TRIGLYCERIDES: Triglycerides: 77 mg/dL (ref <150)

## 2021-05-25 LAB — C-REACTIVE PROTEIN: CRP: 7.1 mg/dL — ABNORMAL HIGH (ref <1.0)

## 2021-05-25 MED ORDER — HYDROCOD POLST-CPM POLST ER 10-8 MG/5ML PO SUER: 5.0000 mL | Freq: Two times a day (BID) | ORAL | Status: DC | PRN

## 2021-05-25 MED ORDER — ONDANSETRON HCL 4 MG/2ML IJ SOLN: 4.0000 mg | Freq: Four times a day (QID) | INTRAMUSCULAR | Status: DC | PRN

## 2021-05-25 MED ORDER — AMLODIPINE BESYLATE 10 MG PO TABS
10.0000 mg | ORAL_TABLET | Freq: Every day | ORAL | Status: DC
  Administered 2021-05-25 – 2021-05-28 (×4): 10 mg via ORAL
  Filled 2021-05-25 (×4): qty 1

## 2021-05-25 MED ORDER — ACETAMINOPHEN 325 MG PO TABS
650.0000 mg | ORAL_TABLET | Freq: Four times a day (QID) | ORAL | Status: DC | PRN
  Administered 2021-05-25 – 2021-05-26 (×2): 650 mg via ORAL
  Filled 2021-05-25 (×2): qty 2

## 2021-05-25 MED ORDER — MEMANTINE HCL-DONEPEZIL HCL ER 28-10 MG PO CP24: 1.0000 | ORAL_CAPSULE | Freq: Every morning | ORAL | Status: DC

## 2021-05-25 MED ORDER — SODIUM CHLORIDE 0.9 % IV SOLN
200.0000 mg | Freq: Once | INTRAVENOUS | Status: AC
  Administered 2021-05-25: 200 mg via INTRAVENOUS
  Filled 2021-05-25: qty 40

## 2021-05-25 MED ORDER — ENOXAPARIN SODIUM 40 MG/0.4ML IJ SOSY
40.0000 mg | PREFILLED_SYRINGE | INTRAMUSCULAR | Status: DC
  Administered 2021-05-25 – 2021-05-28 (×4): 40 mg via SUBCUTANEOUS
  Filled 2021-05-25 (×4): qty 0.4

## 2021-05-25 MED ORDER — IPRATROPIUM-ALBUTEROL 20-100 MCG/ACT IN AERS
1.0000 | INHALATION_SPRAY | Freq: Four times a day (QID) | RESPIRATORY_TRACT | Status: DC
  Administered 2021-05-25 – 2021-05-27 (×9): 1 via RESPIRATORY_TRACT
  Filled 2021-05-25: qty 4

## 2021-05-25 MED ORDER — ALPRAZOLAM 0.25 MG PO TABS
0.1250 mg | ORAL_TABLET | Freq: Every evening | ORAL | Status: DC | PRN
  Administered 2021-05-25 – 2021-05-26 (×2): 0.125 mg via ORAL
  Filled 2021-05-25 (×2): qty 1

## 2021-05-25 MED ORDER — LABETALOL HCL 300 MG PO TABS
300.0000 mg | ORAL_TABLET | Freq: Two times a day (BID) | ORAL | Status: DC
  Administered 2021-05-25 – 2021-05-29 (×8): 300 mg via ORAL
  Filled 2021-05-25 (×9): qty 1

## 2021-05-25 MED ORDER — POLYVINYL ALCOHOL 1.4 % OP SOLN
1.0000 [drp] | Freq: Every day | OPHTHALMIC | Status: DC | PRN
  Filled 2021-05-25: qty 15

## 2021-05-25 MED ORDER — LISINOPRIL 20 MG PO TABS
40.0000 mg | ORAL_TABLET | Freq: Every day | ORAL | Status: DC
  Administered 2021-05-25 – 2021-05-28 (×4): 40 mg via ORAL
  Filled 2021-05-25 (×4): qty 2

## 2021-05-25 MED ORDER — ONDANSETRON HCL 4 MG PO TABS
4.0000 mg | ORAL_TABLET | Freq: Four times a day (QID) | ORAL | Status: DC | PRN
  Administered 2021-05-25: 4 mg via ORAL
  Filled 2021-05-25: qty 1

## 2021-05-25 MED ORDER — CITALOPRAM HYDROBROMIDE 20 MG PO TABS
10.0000 mg | ORAL_TABLET | Freq: Every morning | ORAL | Status: DC
  Administered 2021-05-26 – 2021-05-29 (×4): 10 mg via ORAL
  Filled 2021-05-25 (×3): qty 1

## 2021-05-25 MED ORDER — METHYLPREDNISOLONE SODIUM SUCC 125 MG IJ SOLR
90.0000 mg | INTRAMUSCULAR | Status: DC
  Administered 2021-05-25: 90 mg via INTRAVENOUS
  Filled 2021-05-25: qty 2

## 2021-05-25 MED ORDER — IOHEXOL 350 MG/ML SOLN
80.0000 mL | Freq: Once | INTRAVENOUS | Status: AC | PRN
  Administered 2021-05-25: 80 mL via INTRAVENOUS

## 2021-05-25 MED ORDER — SODIUM CHLORIDE 0.9 % IV SOLN
100.0000 mg | Freq: Every day | INTRAVENOUS | Status: AC
  Administered 2021-05-26 – 2021-05-29 (×4): 100 mg via INTRAVENOUS
  Filled 2021-05-25 (×4): qty 20

## 2021-05-25 MED ORDER — MEMANTINE HCL ER 28 MG PO CP24
28.0000 mg | ORAL_CAPSULE | Freq: Every day | ORAL | Status: DC
  Administered 2021-05-25 – 2021-05-28 (×4): 28 mg via ORAL
  Filled 2021-05-25 (×5): qty 1

## 2021-05-25 MED ORDER — ASCORBIC ACID 500 MG PO TABS
500.0000 mg | ORAL_TABLET | Freq: Every day | ORAL | Status: DC
  Administered 2021-05-25 – 2021-05-29 (×5): 500 mg via ORAL
  Filled 2021-05-25 (×5): qty 1

## 2021-05-25 MED ORDER — POLYETHYL GLYCOL-PROPYL GLYCOL 0.4-0.3 % OP SOLN: 1.0000 [drp] | Freq: Every day | OPHTHALMIC | Status: DC | PRN

## 2021-05-25 MED ORDER — SODIUM CHLORIDE 0.9 % IV BOLUS
500.0000 mL | Freq: Once | INTRAVENOUS | Status: AC
  Administered 2021-05-25: 500 mL via INTRAVENOUS

## 2021-05-25 MED ORDER — DONEPEZIL HCL 10 MG PO TABS
10.0000 mg | ORAL_TABLET | Freq: Every day | ORAL | Status: DC
  Administered 2021-05-25 – 2021-05-28 (×4): 10 mg via ORAL
  Filled 2021-05-25 (×4): qty 1

## 2021-05-25 MED ORDER — PREDNISONE 50 MG PO TABS: 50.0000 mg | ORAL_TABLET | Freq: Every day | ORAL | Status: DC

## 2021-05-25 MED ORDER — QUETIAPINE FUMARATE 25 MG PO TABS
50.0000 mg | ORAL_TABLET | Freq: Every day | ORAL | Status: DC
  Administered 2021-05-25 – 2021-05-28 (×4): 50 mg via ORAL
  Filled 2021-05-25 (×4): qty 2

## 2021-05-25 MED ORDER — GUAIFENESIN-DM 100-10 MG/5ML PO SYRP
10.0000 mL | ORAL_SOLUTION | ORAL | Status: DC | PRN
  Administered 2021-05-25 – 2021-05-29 (×2): 10 mL via ORAL
  Filled 2021-05-25 (×2): qty 10

## 2021-05-25 MED ORDER — ATORVASTATIN CALCIUM 10 MG PO TABS
20.0000 mg | ORAL_TABLET | Freq: Every day | ORAL | Status: DC
  Administered 2021-05-25 – 2021-05-29 (×5): 20 mg via ORAL
  Filled 2021-05-25 (×5): qty 2

## 2021-05-25 MED ORDER — ZINC SULFATE 220 (50 ZN) MG PO CAPS
220.0000 mg | ORAL_CAPSULE | Freq: Every day | ORAL | Status: DC
  Administered 2021-05-25 – 2021-05-29 (×5): 220 mg via ORAL
  Filled 2021-05-25 (×6): qty 1

## 2021-05-25 NOTE — ED Triage Notes (Signed)
Came from home via EMS. Increased weakness and sob over last 3x days. Two falls in last three days as well. Via EMS audible wheezes in upper, diminished lower. Hx of dementia.

## 2021-05-25 NOTE — ED Provider Notes (Signed)
[facility] DEPT Provider Note   CSN: [place]:[id number] Arrival date & time: 05/25/21  1006     History Chief Complaint  Patient presents with   Respiratory Distress   Fall   Weakness    Teresa Riley is a 86 y.o. female.  Level 5 caveat due to dementia.  For the most part patient has no complaints.  States that she remembers sliding off her bed today and unable to get up.  Uses a [employer].  Lives at home.  Does not remember if she hit her head or not.  Per EMS patient not on blood thinners.  Breathing treatments were given by EMS.  However no history of asthma or COPD.  The history is provided by the patient.  Fall This is a new problem. The current episode started 1 to 2 hours ago. Pertinent negatives include no chest pain, no abdominal pain, no headaches and no shortness of breath. Nothing aggravates the symptoms. Nothing relieves the symptoms. She has tried nothing for the symptoms. The treatment provided no relief.  Weakness Associated symptoms: no abdominal pain, no arthralgias, no chest pain, no cough, no dysuria, no fever, no headaches, no seizures, no shortness of breath and no vomiting       Past Medical History:  Diagnosis Date   Allergic rhinitis 09/05/2014   Allergic rhinitis, cause unspecified 10/29/2010   Chronic constipation 10/29/2010   CKD (chronic kidney disease) stage 3, GFR 30-59 ml/min (HCC) 04/28/2017   Eczema 10/29/2010   Gait difficulty    H/O: hysterectomy 10/29/2010   History of cervical cancer 10/29/2010   HTN (hypertension) 10/29/2010   Hyperlipidemia    Hypertension    Impaired glucose tolerance 08/16/2012   Loss of appetite    Memory loss    Orthostatic hypotension 06/05/2012   Osteoporosis    Right hip pain    Seizures ([place])    Vascular dementia ([place])    Vitamin D deficiency 09/05/2014    Patient Active Problem List   Diagnosis Date Noted   Right knee pain 11/12/2019   Pain and swelling of right lower leg 11/12/2019    Weight gain 10/17/2019   Wheezing 10/17/2019   Acute dysfunction of left eustachian tube 01/16/2019   Hyperglycemia 01/16/2019   CKD (chronic kidney disease) stage 3, GFR 30-59 ml/min (HCC) 04/28/2017   Hemorrhoids 04/28/2017   Bilateral hearing loss 11/06/2016   Headache 11/05/2016   Thrush 11/05/2016   Paresthesia 09/27/2016   Vertigo 07/04/2016   Urinary retention 06/29/2016   Gait disorder 06/29/2016   General weakness 09/16/2015   Seizures ([place]) 08/27/2015   Dry eyes 05/27/2015   Dry skin 03/12/2015   Rash and nonspecific skin eruption 03/12/2015   Esophageal dysmotility 01/15/2015   Colon cancer screening - declined 01/15/2015   Syncope and collapse 10/03/2014   Constipation 09/05/2014   Encounter for well adult exam with abnormal findings 09/05/2014   Osteoporosis 09/05/2014   Vitamin D deficiency 09/05/2014   Allergic rhinitis 09/05/2014   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     Family History  Problem Relation Age of Onset   Hypertension Mother    Hypertension Father    Diabetes Father    High blood pressure Daughter        x2   Colon cancer Neg Hx    Colon polyps Neg Hx    Kidney disease Neg Hx    Gallbladder disease Neg Hx     Social History   Tobacco Use   Smoking status: Former    Types: Cigarettes    Quit date: 07/13/1971    Years since quitting: 49.9   Smokeless tobacco: Never   Tobacco comments:    quit in 1973   Vaping Use   Vaping Use: Never used  Substance Use Topics   Alcohol use: No    Alcohol/week: 0.0 standard drinks   Drug use: No    Home Medications Prior to Admission medications   Medication Sig Start Date End Date Taking? Authorizing Provider  acetaminophen (TYLENOL) 325 MG tablet Take 325-650 mg by mouth 2 (two) times daily as needed (eye pain).    [provider]  albuterol (PROVENTIL) (2.5 MG/3ML) 0.083% nebulizer solution Take 3 mLs (2.5 mg total) by nebulization every 6 (six) hours as needed for wheezing or shortness of breath. 03/29/21   Biagio Borg, MD  ALPRAZolam Duanne Moron) 0.25 MG tablet TAKE 1 TABLET(0.25 MG) BY MOUTH TWICE DAILY AS NEEDED 03/29/21   Biagio Borg, MD  amLODipine (NORVASC) 10 MG tablet TAKE 1 TABLET(10 MG) BY MOUTH DAILY 03/29/21   Biagio Borg, MD  aspirin EC 81 MG tablet Take 81 mg by mouth daily.     [provider]  atorvastatin (LIPITOR) 20 MG tablet Take 1 tablet (20 mg total) by mouth daily. 03/29/21 03/29/22  Biagio Borg, MD  Calcium-Magnesium-Vitamin D (CALCIUM MAGNESIUM PO) Take 1 tablet by mouth daily with lunch.     [provider]  citalopram (CELEXA) 10 MG tablet TAKE 1 TABLET(10 MG) BY MOUTH DAILY 03/29/21   Biagio Borg, MD  denosumab (PROLIA) 60 MG/ML SOSY injection Inject 60 mg into the skin every 6 (six) months.    [provider]  fluocinonide cream (LIDEX) 0.05 % Apply topically 2 (two) times daily. 04/28/17   Biagio Borg, MD  fluticasone (FLONASE) 50 MCG/ACT nasal spray Place 1 spray into both nostrils daily.    [provider]  guaiFENesin (MUCINEX) 600 MG 12 hr tablet Take 2 tablets (1,200 mg total) by mouth 2 (two) times daily as needed. 01/16/19   Biagio Borg, MD  HYDROcodone-acetaminophen (NORCO/VICODIN) 5-325 MG tablet Take 1 tablet by mouth every 6 (six) hours as needed for moderate pain. 11/12/19   Biagio Borg, MD  hydrocortisone cream 1 % Apply 1 application topically 2 (two) times daily  as needed for itching (eczema/rash).    [provider]  labetalol (NORMODYNE) 300 MG tablet TAKE 1 TABLET(300 MG) BY MOUTH TWICE DAILY 03/29/21   Biagio Borg, MD  Liraglutide -Weight Management (SAXENDA) 18 MG/3ML SOPN Inject 0.1 mLs (0.6 mg total) into the skin once a week. 10/17/19   Biagio Borg, MD  lisinopril (ZESTRIL) 40 MG tablet Take 1 tablet (40 mg total) by mouth daily. 03/29/21   Biagio Borg, MD  loratadine (CLARITIN) 10 MG tablet Take 10 mg by mouth daily as needed for  allergies or rhinitis.     [provider]  Multiple Vitamins-Minerals (SUPER THERA VITE M) TABS Take by mouth.    [provider]  Omega-3 Fatty Acids (OMEGA 3 PO) Take 1 capsule by mouth daily with lunch.     [provider]  Polyethyl Glycol-Propyl Glycol (SYSTANE OP) Place 1 drop into both eyes daily as needed (dry eyes).     [provider]  QUEtiapine (SEROQUEL) 50 MG tablet TAKE 1 TABLET(50 MG) BY MOUTH AT BEDTIME 03/29/21   Biagio Borg, MD    Allergies    Patient has no known allergies.  Review of Systems   Review of Systems  Constitutional:  Negative for chills and fever.  HENT:  Negative for ear pain and sore throat.   Eyes:  Negative for pain and visual disturbance.  Respiratory:  Negative for cough and shortness of breath.   Cardiovascular:  Negative for chest pain and palpitations.  Gastrointestinal:  Negative for abdominal pain and vomiting.  Genitourinary:  Negative for dysuria and hematuria.  Musculoskeletal:  Negative for arthralgias and back pain.  Skin:  Negative for color change and rash.  Neurological:  Positive for weakness. Negative for seizures, syncope and headaches.  All other systems reviewed and are negative.  Physical Exam Updated Vital Signs BP (!) 178/97 (BP Location: Right Arm)   Pulse (!) 102   Temp 97.7 F (36.5 C) (Rectal)   Resp (!) 22   Ht 5\' 3"  (1.6 m)   Wt 90 kg   SpO2 95%   BMI 35.15 kg/m   Physical Exam Vitals  and nursing note reviewed.  Constitutional:      General: She is not in acute distress.    Appearance: She is well-developed. She is not ill-appearing.  HENT:     Head: Normocephalic and atraumatic.     Nose: Nose normal.     Mouth/Throat:     Mouth: Mucous membranes are moist.  Eyes:     Extraocular Movements: Extraocular movements intact.     Conjunctiva/sclera: Conjunctivae normal.     Pupils: Pupils are equal, round, and reactive to light.  Cardiovascular:     Rate and Rhythm: Normal rate and regular rhythm.     Heart sounds: No murmur heard. Pulmonary:     Effort: Pulmonary effort is normal. No respiratory distress.     Breath sounds: Normal breath sounds.  Abdominal:     Palpations: Abdomen is soft.     Tenderness: There is no abdominal tenderness.  Musculoskeletal:        General: No tenderness.     Cervical back: Normal range of motion and neck supple. No tenderness.     Comments: No midline spinal tenderness  Skin:    General: Skin is warm and dry.     Capillary Refill: Capillary refill takes less than 2 seconds.  Neurological:     General: No focal deficit present.     Mental Status: She is alert.     Comments: Moves all extremities, follows commands appears to have good strength and sensation    ED Results / Procedures / Treatments   Labs (all labs ordered are listed, but only abnormal results are displayed) Labs Reviewed  RESP PANEL BY RT-PCR (FLU A&B, COVID) ARPGX2 - Abnormal; Notable for the following components:      Result Value   SARS Coronavirus 2 by RT PCR POSITIVE (*)    All other components within normal limits  COMPREHENSIVE METABOLIC PANEL -  Abnormal; Notable for the following components:   Glucose, Bld 145 (*)    Creatinine, Ser 1.17 (*)    Total Protein 8.7 (*)    AST 61 (*)    GFR, Estimated 46 (*)    All other components within normal limits  BRAIN NATRIURETIC PEPTIDE - Abnormal; Notable for the following components:   B Natriuretic  Peptide 123.4 (*)    All other components within normal limits  URINALYSIS, ROUTINE W REFLEX MICROSCOPIC - Abnormal; Notable for the following components:   Ketones, ur 5 (*)    Protein, ur 100 (*)    All other components within normal limits  URINE CULTURE  CBC WITH DIFFERENTIAL/PLATELET  TROPONIN I (HIGH SENSITIVITY)    EKG EKG Interpretation  Date/Time:  Monday May 25 2021 10:48:11 EST Ventricular Rate:  94 PR Interval:  178 QRS Duration: 96 QT Interval:  371 QTC Calculation: 464 R Axis:   8 Text Interpretation: Sinus rhythm Low voltage, precordial leads Consider anterior infarct Confirmed by Lennice Sites ([id number]) on 05/25/2021 11:20:54 AM  Radiology CT Head Wo Contrast  Result Date: 05/25/2021 CLINICAL DATA:  Head trauma, moderate to severe.  Fell from bed. EXAM: CT HEAD WITHOUT CONTRAST TECHNIQUE: Contiguous axial images were obtained from the base of the skull through the vertex without intravenous contrast. COMPARISON:  MRI 11/18/2016.  CT 05/08/2014. FINDINGS: Brain: No CT evidence of acute infarction. There is chronic brain atrophy with extensive chronic small-vessel ischemic changes throughout the hemispheric white matter. There is ventriculomegaly, similar to the MRI of 2018 but progressive when compared to the CT of 2015. Whereas ventriculomegaly may relate to central atrophy, normal pressure hydrocephalus is not excluded on the basis of the imaging. No sign of mass, hemorrhage or extra-axial collection. Vascular: There is atherosclerotic calcification of the major vessels at the base of the brain. Skull: Negative Sinuses/Orbits: Mild mucosal thickening of the maxillary sinuses. Tiny amount of fluid in the sphenoid sinus. Orbits negative. Other: None IMPRESSION: No acute CT finding. Generalized atrophy. Advanced chronic small-vessel ischemic changes of the white matter. Chronic ventriculomegaly, similar to the MRI of 2018 but advanced compared to the CT of 2015. Findings  may be due to central atrophy, but chronic normal pressure hydrocephalus is not excluded by the imaging. Electronically Signed   By: Nelson Chimes M.D.   On: 05/25/2021 11:41   CT Cervical Spine Wo Contrast  Result Date: 05/25/2021 CLINICAL DATA:  [place] from the bed.  Trauma to the head and neck. EXAM: CT CERVICAL SPINE WITHOUT CONTRAST TECHNIQUE: Multidetector CT imaging of the cervical spine was performed without intravenous contrast. Multiplanar CT image reconstructions were also generated. COMPARISON:  None. FINDINGS: Alignment: Mild scoliotic curvature.  No traumatic malalignment. Skull base and vertebrae: No fracture or focal bone lesion. Soft tissues and spinal canal: No significant soft tissue neck finding. Disc levels: Ordinary osteoarthritis at the C1-2 articulation. No encroachment upon the neural structures. Degenerative spondylosis at C5-6 and C6-7 but without apparent compressive bony narrowing of the canal or foramina. Upper chest: Negative Other: None IMPRESSION: No acute or traumatic finding. Ordinary chronic degenerative spondylosis at C5-6 and C6-7. Electronically Signed   By: Nelson Chimes M.D.   On: 05/25/2021 11:43   DG Pelvis Portable  Result Date: 05/25/2021 CLINICAL DATA:  Fall. Weakness and shortness of breath over the last 3 days. EXAM: PORTABLE PELVIS 1-2 VIEWS COMPARISON:  None. FINDINGS: There is no evidence of pelvic fracture or diastasis. No pelvic bone lesions are seen. IMPRESSION:  Negative. Electronically Signed   By: Norva Pavlov M.D.   On: 05/25/2021 11:01   DG Chest Portable 1 View  Result Date: 05/25/2021 CLINICAL DATA:  Shortness of breath.  Increased weakness. EXAM: PORTABLE CHEST 1 VIEW COMPARISON:  10/17/2019 and chest CT 11/06/2014 FINDINGS: Again noted is elevation of the right hemidiaphragm. Increased right basilar chest densities. Otherwise, the lungs are clear. Retrocardiac opacity could be related to thoracic aorta or a hiatal hernia. Heart size is  within normal limits and stable. Atherosclerotic calcifications at the aortic arch. IMPRESSION: Increased right basilar densities are most compatible with atelectasis. Otherwise, no acute chest findings. Electronically Signed   By: Richarda Overlie M.D.   On: 05/25/2021 10:49    Procedures .Critical Care Performed by: Virgina Norfolk, DO Authorized by: Virgina Norfolk, DO   Critical care provider statement:    Critical care time (minutes):  35   Critical care was necessary to treat or prevent imminent or life-threatening deterioration of the following conditions:  Respiratory failure   Critical care was time spent personally by me on the following activities:  Blood draw for specimens, development of treatment plan with patient or surrogate, discussions with primary provider, evaluation of patient's response to treatment, examination of patient, obtaining history from patient or surrogate, ordering and performing treatments and interventions, ordering and review of laboratory studies, ordering and review of radiographic studies, pulse oximetry, re-evaluation of patient's condition and review of old charts   I assumed direction of critical care for this patient from another provider in my specialty: no     Care discussed with: admitting provider     Medications Ordered in ED Medications  remdesivir 200 mg in sodium chloride 0.9% 250 mL IVPB (has no administration in time range)    Followed by  remdesivir 100 mg in sodium chloride 0.9 % 100 mL IVPB (has no administration in time range)  sodium chloride 0.9 % bolus 500 mL (500 mLs Intravenous New Bag/Given 05/25/21 1226)    ED Course  I have reviewed the triage vital signs and the nursing notes.  Pertinent labs & imaging results that were available during my care of the patient were reviewed by me and considered in my medical decision making (see chart for details).    MDM Rules/Calculators/A&P                           Teresa Riley is here  with generalized weakness, fall.  Unremarkable vitals.  No fever.  Viral type symptoms per family last several days.  Congestion cough.  Family member with the same.  Has not taken COVID test.  Slipped out of bed today.  Not sure if she hit her head or lost consciousness.  She neurologically appears to be at her baseline.  She does have history of dementia, high blood pressure.  Not on blood thinners.  She denies any chest pain or shortness of breath or extremity pain.  She is supposed to use a Rempel at baseline.  Family concern for possible dehydration.  Concerned about possibly COVID.  CT scan of her head, neck, showed no acute findings.  She does have enlarged ventricles but this is unchanged from MRI from several years ago.  She follows with neurology for her dementia.  Have very low suspicion for normal pressure hydrocephalus but encouraged continued outpatient work-up with neurology.  Chest x-ray and pelvic x-ray unremarkable.  No focal pneumonia on chest x-ray.  White  count is 10.3.  Creatinine mildly elevated but otherwise no significant electrolyte abnormality.  Troponin within normal limits.  EKG shows sinus rhythm.  She is not having any chest pain and doubt cardiac process.  Awaiting viral panel and urinalysis.  Suspect viral process.  COVID test is positive.  Urinalysis negative.  She has had more persistent drops in her oxygen to the mid 80s.  We will place her on 2 L of oxygen.  Has already gotten IV steroids with EMS.  We will start her on IV remdesivir.  To be admitted to medicine service for further care.  This chart was dictated using voice recognition software.  Despite best efforts to proofread,  errors can occur which can change the documentation meaning.   Final Clinical Impression(s) / ED Diagnoses Final diagnoses:  COVID-19  Acute respiratory failure with hypoxia [facility])    Rx / DC Orders ED Discharge Orders     None        Lennice Sites, DO 05/25/21 1339

## 2021-05-25 NOTE — H&P (Signed)
History and Physical    Teresa Riley [id number] DOB: 01/10/1935 DOA: 05/25/2021  PCP: Biagio Borg, MD  Patient coming from: home  Chief Complaint: multiple falls, weakness  HPI: Teresa Riley is a 86 y.o. female with medical history significant of HTN, dementia, CKD, HLD. Presenting with multiple falls and weakness. History is from daughter at bedside as patient has dementia and is a poor historian. She had runny nose and cough for the last 3 days. She took robitussin and this help a little. Her daughter noticed that she was wheezy yesterday and seemed weaker. She had a small fall while using her [place] in the afternoon. She stumbled and lost her footing, but did not pass out or have head injury. She was able to go to bed without incident. Her daughter check on her this morning and found her on the floor. She had labored breathing. The patient said she was trying to get out of bed and slid to the ground. She did not pass out or hit her head. Her family lifted her up and called for EMS.   ED Course: She was noted to be hypoxic on RA. She was placed on O2. She was noted to be COVID positive. She was started on remdes. [facility] was called for admission.   Review of Systems:  Review of systems is otherwise negative for all not mentioned in HPI.   PMHx Past Medical History:  Diagnosis Date   Allergic rhinitis 09/05/2014   Allergic rhinitis, cause unspecified 10/29/2010   Chronic constipation 10/29/2010   CKD (chronic kidney disease) stage 3, GFR 30-59 ml/min (HCC) 04/28/2017   Eczema 10/29/2010   Gait difficulty    H/O: hysterectomy 10/29/2010   History of cervical cancer 10/29/2010   HTN (hypertension) 10/29/2010   Hyperlipidemia    Hypertension    Impaired glucose tolerance 08/16/2012   Loss of appetite    Memory loss    Orthostatic hypotension 06/05/2012   Osteoporosis    Right hip pain    Seizures (HCC)    Vascular dementia ([place])    Vitamin D deficiency 09/05/2014    PSHx Past  Surgical History:  Procedure Laterality Date   ABDOMINAL HYSTERECTOMY  1970   BREAST BIOPSY  1980's   benign   CATARACT EXTRACTION W/ INTRAOCULAR LENS  IMPLANT, BILATERAL  01/2011   EYE SURGERY      SocHx  reports that she quit smoking about 49 years ago. Her smoking use included cigarettes. She has never used smokeless tobacco. She reports that she does not drink alcohol and does not use drugs.  No Known Allergies  FamHx Family History  Problem Relation Age of Onset   Hypertension Mother    Hypertension Father    Diabetes Father    High blood pressure Daughter        x2   Colon cancer Neg Hx    Colon polyps Neg Hx    Kidney disease Neg Hx    Gallbladder disease Neg Hx     Prior to Admission medications   Medication Sig Start Date End Date Taking? Authorizing Provider  acetaminophen (TYLENOL) 500 MG tablet Take 1,000 mg by mouth every 6 (six) hours as needed for headache or fever (pain).   Yes [provider]  albuterol (VENTOLIN HFA) 108 (90 Base) MCG/ACT inhaler Inhale 2 puffs into the lungs every 6 (six) hours as needed for wheezing or shortness of breath.   Yes [provider]  ALPRAZolam Duanne Moron) 0.25 MG  tablet TAKE 1 TABLET(0.25 MG) BY MOUTH TWICE DAILY AS NEEDED Patient taking differently: Take 0.125 mg by mouth at bedtime. 03/29/21  Yes Corwin Levins, MD  amLODipine (NORVASC) 10 MG tablet TAKE 1 TABLET(10 MG) BY MOUTH DAILY Patient taking differently: Take 10 mg by mouth at bedtime. 03/29/21  Yes Corwin Levins, MD  atorvastatin (LIPITOR) 20 MG tablet Take 1 tablet (20 mg total) by mouth daily. 03/29/21 03/29/22 Yes Corwin Levins, MD  citalopram (CELEXA) 10 MG tablet TAKE 1 TABLET(10 MG) BY MOUTH DAILY Patient taking differently: Take 10 mg by mouth every morning. 03/29/21  Yes Corwin Levins, MD  guaiFENesin (ROBITUSSIN) 100 MG/5ML liquid Take 15 mLs by mouth every 4 (four) hours as needed for cough or to loosen phlegm.   Yes [provider]   labetalol (NORMODYNE) 300 MG tablet TAKE 1 TABLET(300 MG) BY MOUTH TWICE DAILY Patient taking differently: Take 300 mg by mouth 2 (two) times daily. 03/29/21  Yes Corwin Levins, MD  lisinopril (ZESTRIL) 40 MG tablet Take 1 tablet (40 mg total) by mouth daily. Patient taking differently: Take 40 mg by mouth at bedtime. 03/29/21  Yes Corwin Levins, MD  Memantine HCl-Donepezil HCl [facility]) 28-10 MG CP24 Take 1 capsule by mouth every morning.   Yes [provider]  Polyethyl Glycol-Propyl Glycol 0.4-0.3 % SOLN Place 1 drop into both eyes daily as needed (dry eyes).    Yes [provider]  QUEtiapine (SEROQUEL) 50 MG tablet TAKE 1 TABLET(50 MG) BY MOUTH AT BEDTIME Patient taking differently: Take 50 mg by mouth at bedtime. 03/29/21  Yes Corwin Levins, MD  albuterol (PROVENTIL) (2.5 MG/3ML) 0.083% nebulizer solution Take 3 mLs (2.5 mg total) by nebulization every 6 (six) hours as needed for wheezing or shortness of breath. Patient not taking: No sig reported 03/29/21   Corwin Levins, MD    Physical Exam: Vitals:   05/25/21 1022 05/25/21 1023 05/25/21 1050 05/25/21 1330  BP: (!) 178/97   (!) 175/95  Pulse:    91  Resp: (!) 22   20  Temp:   97.7 F (36.5 C)   TempSrc:   Rectal   SpO2:    93%  Weight:  90 kg    Height:  5\' 3"  (1.6 m)      General: 86 y.o. female resting in bed in NAD Eyes: PERRL, normal sclera ENMT: Nares patent w/o discharge, orophaynx clear, dentition normal, ears w/o discharge/lesions/ulcers Neck: Supple, trachea midline Cardiovascular: RRR, +S1, S2, no m/g/r, equal pulses throughout Respiratory: decreased at bases, no w/r/r, increased WOB on 3L [place] GI: BS+, NDNT, no masses noted, no organomegaly noted MSK: No e/c/c Neuro: A&O x 2 (name, president), no focal deficits Psyc: Appropriate interaction and affect, calm/cooperative  Labs on Admission: I have personally reviewed following labs and imaging studies  CBC: Recent Labs  Lab 05/25/21 1017  WBC  10.3  NEUTROABS 7.2  HGB 12.8  HCT 38.5  MCV 92.8  PLT 264   Basic Metabolic Panel: Recent Labs  Lab 05/25/21 1017  NA 135  K 3.7  CL 100  CO2 23  GLUCOSE 145*  BUN 23  CREATININE 1.17*  CALCIUM 9.4   GFR: Estimated Creatinine Clearance: 38.1 mL/min (A) (by C-G formula based on SCr of 1.17 mg/dL (H)). Liver Function Tests: Recent Labs  Lab 05/25/21 1017  AST 61*  ALT 21  ALKPHOS 120  BILITOT 0.9  PROT 8.7*  ALBUMIN 4.1   No  results for input(s): LIPASE, AMYLASE in the last 168 hours. No results for input(s): AMMONIA in the last 168 hours. Coagulation Profile: No results for input(s): INR, PROTIME in the last 168 hours. Cardiac Enzymes: No results for input(s): CKTOTAL, CKMB, CKMBINDEX, TROPONINI in the last 168 hours. BNP (last 3 results) No results for input(s): PROBNP in the last 8760 hours. HbA1C: No results for input(s): HGBA1C in the last 72 hours. CBG: No results for input(s): GLUCAP in the last 168 hours. Lipid Profile: Recent Labs    05/25/21 1017  TRIG 77   Thyroid Function Tests: No results for input(s): TSH, T4TOTAL, FREET4, T3FREE, THYROIDAB in the last 72 hours. Anemia Panel: Recent Labs    05/25/21 1017  FERRITIN 89   Urine analysis:    Component Value Date/Time   COLORURINE YELLOW 05/25/2021 [place] 05/25/2021 1154   LABSPEC 1.025 05/25/2021 1154   PHURINE 5.0 05/25/2021 1154   GLUCOSEU NEGATIVE 05/25/2021 1154   GLUCOSEU NEGATIVE 03/23/2021 1435   HGBUR NEGATIVE 05/25/2021 1154   BILIRUBINUR NEGATIVE 05/25/2021 1154   KETONESUR 5 (A) 05/25/2021 1154   PROTEINUR 100 (A) 05/25/2021 1154   UROBILINOGEN 0.2 03/23/2021 1435   NITRITE NEGATIVE 05/25/2021 1154   LEUKOCYTESUR NEGATIVE 05/25/2021 1154    Radiological Exams on Admission: CT Head Wo Contrast  Result Date: 05/25/2021 CLINICAL DATA:  Head trauma, moderate to severe.  Fell from bed. EXAM: CT HEAD WITHOUT CONTRAST TECHNIQUE: Contiguous axial images  were obtained from the base of the skull through the vertex without intravenous contrast. COMPARISON:  MRI 11/18/2016.  CT 05/08/2014. FINDINGS: Brain: No CT evidence of acute infarction. There is chronic brain atrophy with extensive chronic small-vessel ischemic changes throughout the hemispheric white matter. There is ventriculomegaly, similar to the MRI of 2018 but progressive when compared to the CT of 2015. Whereas ventriculomegaly may relate to central atrophy, normal pressure hydrocephalus is not excluded on the basis of the imaging. No sign of mass, hemorrhage or extra-axial collection. Vascular: There is atherosclerotic calcification of the major vessels at the base of the brain. Skull: Negative Sinuses/Orbits: Mild mucosal thickening of the maxillary sinuses. Tiny amount of fluid in the sphenoid sinus. Orbits negative. Other: None IMPRESSION: No acute CT finding. Generalized atrophy. Advanced chronic small-vessel ischemic changes of the white matter. Chronic ventriculomegaly, similar to the MRI of 2018 but advanced compared to the CT of 2015. Findings may be due to central atrophy, but chronic normal pressure hydrocephalus is not excluded by the imaging. Electronically Signed   By: Nelson Chimes M.D.   On: 05/25/2021 11:41   CT Cervical Spine Wo Contrast  Result Date: 05/25/2021 CLINICAL DATA:  [place] from the bed.  Trauma to the head and neck. EXAM: CT CERVICAL SPINE WITHOUT CONTRAST TECHNIQUE: Multidetector CT imaging of the cervical spine was performed without intravenous contrast. Multiplanar CT image reconstructions were also generated. COMPARISON:  None. FINDINGS: Alignment: Mild scoliotic curvature.  No traumatic malalignment. Skull base and vertebrae: No fracture or focal bone lesion. Soft tissues and spinal canal: No significant soft tissue neck finding. Disc levels: Ordinary osteoarthritis at the C1-2 articulation. No encroachment upon the neural structures. Degenerative spondylosis at C5-6 and  C6-7 but without apparent compressive bony narrowing of the canal or foramina. Upper chest: Negative Other: None IMPRESSION: No acute or traumatic finding. Ordinary chronic degenerative spondylosis at C5-6 and C6-7. Electronically Signed   By: Nelson Chimes M.D.   On: 05/25/2021 11:43   DG Pelvis Portable  Result Date: 05/25/2021 CLINICAL DATA:  Fall. Weakness and shortness of breath over the last 3 days. EXAM: PORTABLE PELVIS 1-2 VIEWS COMPARISON:  None. FINDINGS: There is no evidence of pelvic fracture or diastasis. No pelvic bone lesions are seen. IMPRESSION: Negative. Electronically Signed   By: Nolon Nations M.D.   On: 05/25/2021 11:01   DG Chest Portable 1 View  Result Date: 05/25/2021 CLINICAL DATA:  Shortness of breath.  Increased weakness. EXAM: PORTABLE CHEST 1 VIEW COMPARISON:  10/17/2019 and chest CT 11/06/2014 FINDINGS: Again noted is elevation of the right hemidiaphragm. Increased right basilar chest densities. Otherwise, the lungs are clear. Retrocardiac opacity could be related to thoracic aorta or a hiatal hernia. Heart size is within normal limits and stable. Atherosclerotic calcifications at the aortic arch. IMPRESSION: Increased right basilar densities are most compatible with atelectasis. Otherwise, no acute chest findings. Electronically Signed   By: Markus Daft M.D.   On: 05/25/2021 10:49    EKG: Independently reviewed. Sinus, no st elevations  Assessment/Plan COVID 19 Acute hypoxic respiratory failure     - admit to inpt, tele     - remdes, steroids, combivent, guaifenesin, IS, flutter     - follow inflammatory markers     - on 3L [place]; wean as able     - d-dimer is 5.7; check CTA chest  HTN     - resume home regimen  Dementia     - resume home regimen  HLD     - resume home statin  CKD3a     - she is at baseline, follow  DVT prophylaxis: lovenox  Code Status: FULL  Family Communication: w/ dtr at bedside  Consults called: None   Status is:  Inpatient  Remains inpatient appropriate because: severity of illness  Jonnie Finner DO [facility]  If 7PM-7AM, please contact night-coverage [email or web address]  05/25/2021, 3:26 PM

## 2021-05-26 DIAGNOSIS — R531 Weakness: Secondary | ICD-10-CM

## 2021-05-26 DIAGNOSIS — N183 Chronic kidney disease, stage 3 unspecified: Secondary | ICD-10-CM

## 2021-05-26 DIAGNOSIS — F0390 Unspecified dementia without behavioral disturbance: Secondary | ICD-10-CM

## 2021-05-26 DIAGNOSIS — U071 COVID-19: Secondary | ICD-10-CM | POA: Diagnosis present

## 2021-05-26 DIAGNOSIS — J9600 Acute respiratory failure, unspecified whether with hypoxia or hypercapnia: Secondary | ICD-10-CM

## 2021-05-26 DIAGNOSIS — E7800 Pure hypercholesterolemia, unspecified: Secondary | ICD-10-CM

## 2021-05-26 DIAGNOSIS — I10 Essential (primary) hypertension: Secondary | ICD-10-CM

## 2021-05-26 LAB — COMPREHENSIVE METABOLIC PANEL
ALT: 24 U/L (ref 0–44)
AST: 67 U/L — ABNORMAL HIGH (ref 15–41)
Albumin: 3.4 g/dL — ABNORMAL LOW (ref 3.5–5.0)
Alkaline Phosphatase: 103 U/L (ref 38–126)
Anion gap: 9 (ref 5–15)
BUN: 35 mg/dL — ABNORMAL HIGH (ref 8–23)
CO2: 22 mmol/L (ref 22–32)
Calcium: 8.9 mg/dL (ref 8.9–10.3)
Chloride: 105 mmol/L (ref 98–111)
Creatinine, Ser: 1.15 mg/dL — ABNORMAL HIGH (ref 0.44–1.00)
GFR, Estimated: 47 mL/min — ABNORMAL LOW (ref >60)
Glucose, Bld: 199 mg/dL — ABNORMAL HIGH (ref 70–99)
Potassium: 3.6 mmol/L (ref 3.5–5.1)
Sodium: 136 mmol/L (ref 135–145)
Total Bilirubin: 0.6 mg/dL (ref 0.3–1.2)
Total Protein: 7.6 g/dL (ref 6.5–8.1)

## 2021-05-26 LAB — CBC WITH DIFFERENTIAL/PLATELET
Abs Immature Granulocytes: 0.02 10*3/uL (ref 0.00–0.07)
Basophils Absolute: 0 10*3/uL (ref 0.0–0.1)
Basophils Relative: 0 %
Eosinophils Absolute: 0 10*3/uL (ref 0.0–0.5)
Eosinophils Relative: 0 %
HCT: 37.5 % (ref 36.0–46.0)
Hemoglobin: 12.4 g/dL (ref 12.0–15.0)
Immature Granulocytes: 0 %
Lymphocytes Relative: 17 %
Lymphs Abs: 1.2 10*3/uL (ref 0.7–4.0)
MCH: 31.1 pg (ref 26.0–34.0)
MCHC: 33.1 g/dL (ref 30.0–36.0)
MCV: 94 fL (ref 80.0–100.0)
Monocytes Absolute: 0.2 10*3/uL (ref 0.1–1.0)
Monocytes Relative: 3 %
Neutro Abs: 5.4 10*3/uL (ref 1.7–7.7)
Neutrophils Relative %: 80 %
Platelets: 228 10*3/uL (ref 150–400)
RBC: 3.99 MIL/uL (ref 3.87–5.11)
RDW: 13.8 % (ref 11.5–15.5)
WBC: 6.8 10*3/uL (ref 4.0–10.5)
nRBC: 0 % (ref 0.0–0.2)

## 2021-05-26 LAB — URINE CULTURE: Culture: NO GROWTH

## 2021-05-26 LAB — D-DIMER, QUANTITATIVE: D-Dimer, Quant: 2.9 ug/mL-FEU — ABNORMAL HIGH (ref 0.00–0.50)

## 2021-05-26 LAB — C-REACTIVE PROTEIN: CRP: 6.2 mg/dL — ABNORMAL HIGH (ref <1.0)

## 2021-05-26 LAB — FERRITIN: Ferritin: 109 ng/mL (ref 11–307)

## 2021-05-26 MED ORDER — MOMETASONE FURO-FORMOTEROL FUM 200-5 MCG/ACT IN AERO
2.0000 | INHALATION_SPRAY | Freq: Two times a day (BID) | RESPIRATORY_TRACT | Status: DC
  Administered 2021-05-26 – 2021-05-29 (×7): 2 via RESPIRATORY_TRACT
  Filled 2021-05-26: qty 8.8

## 2021-05-26 MED ORDER — PANTOPRAZOLE SODIUM 40 MG PO TBEC
40.0000 mg | DELAYED_RELEASE_TABLET | Freq: Every day | ORAL | Status: DC
  Administered 2021-05-26 – 2021-05-29 (×4): 40 mg via ORAL
  Filled 2021-05-26 (×3): qty 1

## 2021-05-26 MED ORDER — METHYLPREDNISOLONE SODIUM SUCC 125 MG IJ SOLR
60.0000 mg | Freq: Two times a day (BID) | INTRAMUSCULAR | Status: DC
  Administered 2021-05-26 – 2021-05-27 (×3): 60 mg via INTRAVENOUS
  Filled 2021-05-26 (×3): qty 2

## 2021-05-26 NOTE — Evaluation (Signed)
Occupational Therapy Evaluation Patient Details Name: Teresa Riley MRN: [id number] DOB: 1935-06-25 Today's Date: 05/26/2021   History of Present Illness XANTHE COUILLARD is a 86 y.o. female with medical history significant of HTN, dementia, CKD, HLD. Presenting with multiple falls and weakness, hypoxia, positive for Covid. ambulatory at baseline, lives with daughter   Clinical Impression   Patient was previouly living at home with children support with independence in ADLs with RW. Patient currently is able to stand for about 45 seconds with unsteady posture with increased cues for safety to participate in ADL tasks. Patient was limited by increased fatigue with participation in oral care tasks. Patient would continue to benefit from skilled OT services at this time while admitted and after d/c to address noted deficits in order to improve overall safety and independence in ADLs.        Recommendations for follow up therapy are one component of a multi-disciplinary discharge planning process, led by the attending physician.  Recommendations may be updated based on patient status, additional functional criteria and insurance authorization.   Follow Up Recommendations  Home health OT    Assistance Recommended at Discharge Frequent or constant Supervision/Assistance  Functional Status Assessment  Patient has had a recent decline in their functional status and demonstrates the ability to make significant improvements in function in a reasonable and predictable amount of time.  Equipment Recommendations  Other (comment) (patient has needed items at home)    Recommendations for Other Services       Precautions / Restrictions Precautions Precautions: Fall Precaution Comments: monitor sats Restrictions Weight Bearing Restrictions: No      Mobility Bed Mobility Overal bed mobility: Needs Assistance Bed Mobility: Supine to Sit     Supine to sit: Mod assist     General bed mobility  comments: patient was up in recliner with PT just prior to OT session    Transfers Overall transfer level: Needs assistance Equipment used: Rolling [place] (2 wheels) Transfers: Sit to/from Stand Sit to Stand: Mod assist Stand pivot transfers: Mod assist         General transfer comment: multimodal cues to stand from bed, asmall shuffle steps to pivot to [facility] then to recliner, hand over hand to reach for armrests.      Balance Overall balance assessment: History of Falls;Needs assistance Sitting-balance support: Feet supported;No upper extremity supported Sitting balance-Leahy Scale: Fair     Standing balance support: During functional activity;Single extremity supported;Reliant on assistive device for balance Standing balance-Leahy Scale: Poor Standing balance comment: reliant on support                           ADL either performed or assessed with clinical judgement   ADL Overall ADL's : Needs assistance/impaired Eating/Feeding: Minimal assistance;Sitting   Grooming: Wash/dry face;Standing Grooming Details (indicate cue type and reason): min A for standing balance with patient making impulsive movements during session. patient needed increased sequencing cues for all tasks. patient was able to participate in rinsing mouth standing for about 45 seconds x2 Upper Body Bathing: Sitting;Moderate assistance   Lower Body Bathing: Sit to/from stand;Sitting/lateral leans;Maximal assistance   Upper Body Dressing : Moderate assistance;Sitting   Lower Body Dressing: Sitting/lateral leans;Sit to/from stand;Maximal assistance Lower Body Dressing Details (indicate cue type and reason): patient was unable to adjust sock sitting in recliner chair with increased time. Toilet Transfer: Moderate assistance Toilet Transfer Details (indicate cue type and reason): patient was recently to  recliner with PT. trasnfer was not attempted but min A for sit to stand from recliner with  moments of unsteadiness with RW and continued education on proper hand and foot placement. Toileting- Clothing Manipulation and Hygiene: Maximal assistance;Sit to/from stand;Sitting/lateral lean       Functional mobility during ADLs: Moderate assistance;Rolling [place] (2 wheels)       Vision Patient Visual Report: No change from baseline       Perception     Praxis      Pertinent Vitals/Pain Pain Assessment: No/denies pain Faces Pain Scale: No hurt     Hand Dominance Right   Extremity/Trunk Assessment Upper Extremity Assessment Upper Extremity Assessment: Difficult to assess due to impaired cognition   Lower Extremity Assessment Lower Extremity Assessment: Generalized weakness (tremulous standing)   Cervical / Trunk Assessment Cervical / Trunk Assessment: Normal   Communication Communication Communication: No difficulties   Cognition Arousal/Alertness: Awake/alert Behavior During Therapy: WFL for tasks assessed/performed Overall Cognitive Status: History of cognitive impairments - at baseline                                 General Comments: Follows simple directions and tactile cues for activity     General Comments       Exercises     Shoulder Instructions      Home Living Family/patient expects to be discharged to:: Private residence Living Arrangements: Children Available Help at Discharge: Available 24 hours/day Type of Home: House Home Access: Stairs to enter [employer] of Steps: 4-5 Entrance Stairs-Rails: Right Home Layout: Able to live on main level with bedroom/bathroom     Bathroom Shower/Tub: Producer, television/film/video: Standard     Home Equipment: Agricultural consultant (2 wheels);Shower seat - built in          Prior Functioning/Environment Prior Level of Function : History of Falls (last six months);Needs assist  Cognitive Assist : ADLs (cognitive)   ADLs (Cognitive): Intermittent cues Physical Assist  : Mobility (physical)     Mobility Comments: per daughter,up with RW ADLs Comments: patient was able to complete toileting, bathing and dressing herself at home PLOF        OT Problem List: Decreased activity tolerance;Impaired balance (sitting and/or standing);Decreased coordination;Decreased cognition;Decreased safety awareness;Decreased knowledge of precautions;Decreased knowledge of use of DME or AE;Cardiopulmonary status limiting activity      OT Treatment/Interventions: Self-care/ADL training;Therapeutic exercise;Neuromuscular education;DME and/or AE instruction;Energy conservation;Therapeutic activities;Patient/family education;Balance training    OT Goals(Current goals can be found in the care plan section) Acute Rehab OT Goals Patient Stated Goal: to go home OT Goal Formulation: With patient Time For Goal Achievement: 06/09/21 Potential to Achieve Goals: Good  OT Frequency: Min 2X/week   Barriers to D/C:            Co-evaluation              AM-PAC OT "6 Clicks" Daily Activity     Outcome Measure Help from another person eating meals?: A Little Help from another person taking care of personal grooming?: A Little Help from another person toileting, which includes using toliet, bedpan, or urinal?: A Lot Help from another person bathing (including washing, rinsing, drying)?: A Lot Help from another person to put on and taking off regular upper body clothing?: A Lot Help from another person to put on and taking off regular lower body clothing?: A Lot 6 Click Score: 14  End of Session Equipment Utilized During Treatment: Rolling [place] (2 wheels) Nurse Communication: Mobility status  Activity Tolerance: Patient limited by fatigue;Patient limited by lethargy Patient left: in chair;with call bell/phone within reach;with chair alarm set;with family/visitor present  OT Visit Diagnosis: Unsteadiness on feet (R26.81);Other abnormalities of gait and mobility  (R26.89);History of falling (Z91.81)                Time: 5597-4163 OT Time Calculation (min): 20 min Charges:  OT General Charges $OT Visit: 1 Visit OT Evaluation $OT Eval Low Complexity: 1 Low  Sharyn Blitz OTR/L, MS Acute Rehabilitation Department Office# [phone] Pager# [phone]   Chalmers Guest Murdock Jellison 05/26/2021, 4:51 PM

## 2021-05-26 NOTE — TOC Initial Note (Addendum)
Transition of Care [facility]) - Initial/Assessment Note    Patient Details  Name: Teresa Riley MRN: [id number] Date of Birth: 08-31-1934  Transition [facility]) CM/SW Contact:    Ida Rogue, LCSW Phone Number: 05/26/2021, 3:55 PM  Clinical Narrative:   Called patient's daughter in follow up to PT recommendation of HH PT as patient is COVID positive.  Ms Orson Aloe stated her mother has all needed DME, is open to [facility] PT, no preference of agency.  I explained I would also be the one who would set her mother up with O2 if needed at d/c.  TOC will continue to follow during the course of hospitalization.  Addendum:  Cindie with [facility] agrees to provide order [facility] services                 Expected Discharge Plan: Home w Home Health Services Barriers to Discharge: No Barriers Identified   Patient Goals and CMS Choice     Choice offered to / list presented to : Adult Children  Expected Discharge Plan and Services Expected Discharge Plan: Home w Home Health Services   Discharge Planning Services: CM Consult Post Acute Care Choice: Home Health Living arrangements for the past 2 months: Single Family Home                                      Prior Living Arrangements/Services Living arrangements for the past 2 months: Single Family Home Lives with:: Adult Children Patient language and need for interpreter reviewed:: Yes        Need for Family Participation in Patient Care: Yes (Comment) Care giver support system in place?: Yes (comment) Current home services: DME Criminal Activity/Legal Involvement Pertinent to Current Situation/Hospitalization: No - Comment as needed  Activities of Daily Living Home Assistive Devices/Equipment: Shower chair with back ADL Screening (condition at time of admission) Patient's cognitive ability adequate to safely complete daily activities?: Yes Is the patient deaf or have difficulty hearing?: No Does the patient have difficulty seeing,  even when wearing glasses/contacts?: No Does the patient have difficulty concentrating, remembering, or making decisions?: No Patient able to express need for assistance with ADLs?: Yes Does the patient have difficulty dressing or bathing?: No Independently performs ADLs?: Yes (appropriate for developmental age) Does the patient have difficulty walking or climbing stairs?: No Weakness of Legs: None Weakness of Arms/Hands: None  Permission Sought/Granted Permission sought to share information with : Family Supports Permission granted to share information with : No  Share Information with NAME: Juanito Doom (Daughter)   [phone]           Emotional Assessment       Orientation: : Oriented to Self, Oriented to Place Alcohol / Substance Use: Not Applicable Psych Involvement: No (comment)  Admission diagnosis:  Acute respiratory failure with hypoxia (HCC) [J96.01] COVID-19 [U07.1] Patient Active Problem List   Diagnosis Date Noted   COVID-19 05/25/2021   Right knee pain 11/12/2019   Pain and swelling of right lower leg 11/12/2019   Weight gain 10/17/2019   Wheezing 10/17/2019   Acute dysfunction of left eustachian tube 01/16/2019   Hyperglycemia 01/16/2019   CKD (chronic kidney disease) stage 3, GFR 30-59 ml/min (HCC) 04/28/2017   Hemorrhoids 04/28/2017   Bilateral hearing loss 11/06/2016   Headache 11/05/2016   Thrush 11/05/2016   Paresthesia 09/27/2016   Vertigo 07/04/2016   Urinary retention 06/29/2016  Gait disorder 06/29/2016   General weakness 09/16/2015   Seizures (HCC) 08/27/2015   Dry eyes 05/27/2015   Dry skin 03/12/2015   Rash and nonspecific skin eruption 03/12/2015   Esophageal dysmotility 01/15/2015   Colon cancer screening - declined 01/15/2015   Syncope and collapse 10/03/2014   Constipation 09/05/2014   Encounter for well adult exam with abnormal findings 09/05/2014   Osteoporosis 09/05/2014   Vitamin D deficiency 09/05/2014   Allergic  rhinitis 09/05/2014   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 [address] [facility] [address] [place] [address] Phone: [phone] Fax: [phone]     Social Determinants of Health (SDOH) Interventions    Readmission Risk Interventions No flowsheet data found.

## 2021-05-26 NOTE — Progress Notes (Signed)
PROGRESS NOTE    Teresa Riley  XTA:[id number] DOB: 09-Nov-1934 DOA: 05/25/2021 PCP: Corwin Levins, MD    Chief Complaint  Patient presents with   Respiratory Distress   Fall   Weakness    Brief Narrative:  Patient is a 86 year old female history of hypertension, dementia, CKD, hyperlipidemia presented to the ED for multiple falls, generalized weakness.  Patient noted to have had a runny nose, cough, some wheezing for 3 days prior to admission.  Patient noted to have stumbled and lost her footing and had a fall prior to admission, did not have any syncopal episode or any head injury.  Patient daughter checked on patient the morning of admission and patient found on the floor with some labored breathing.  Patient seen in the ED noted to be hypoxic on room air, placed on O2.  COVID-19 PCR obtained was positive.  Patient started on IV remdesivir and IV steroids.  Patient admitted.   Assessment & Plan:   Principal Problem:   Acute respiratory failure due to COVID-19 [facility]) Active Problems:   COVID-19   HTN (hypertension)   Hyperlipidemia   Dementia (HCC)   General weakness   CKD (chronic kidney disease) stage 3, GFR 30-59 ml/min (HCC)  1 acute hypoxic respiratory failure secondary to acute COVID-19 infection -Patient presented with generalized weakness, noted to have upper respiratory symptoms for 3 days prior to admission. -COVID-19 PCR done was positive.  Influenza PCR done was negative -Patient noted to be hypoxic on room air with new O2 requirements. -Patient with some clinical improvement -Continue IV remdesivir. -Change IV Solu-Medrol to 60 mg every 12 hours. -Continue vitamin C, zinc, Combivent. -Placed on Dulera, Protonix. -Supportive care.  2.  Hypertension -Continue home regimen labetalol, lisinopril.  3.  Dementia -Stable. -Continue Namenda, Aricept. -Seroquel nightly.  4.  Hyperlipidemia -Continue statin.  5.  CKD stage IIIa -Currently stable at  baseline.  6.  Generalized weakness -Likely secondary to problem #1. -PT/OT.   DVT prophylaxis: Lovenox Code Status: Full Family Communication: Updated daughter [name] outside room. Disposition:   Status is: Inpatient  Remains inpatient appropriate because: Severity of illness.       Consultants:  None  Procedures:  CT angiogram chest 05/25/2021 CT head CT C-spine 05/25/2021 Chest x-ray 05/25/2021 Plain films of the pelvis 05/25/2021  Antimicrobials:  IV remdesivir 05/25/2021>>>> 05/30/2021   Subjective: Sitting up in bed.  Denies chest pain.  No shortness of breath.  Abdominal pain.  Pleasantly confused.  Objective: Vitals:   05/25/21 1933 05/25/21 2019 05/26/21 0454 05/26/21 1421  BP: (!) 163/87 (!) 153/76 (!) 149/77 (!) 174/86  Pulse: 96  74 76  Resp: 20  17 14   Temp: 98.3 F (36.8 C)  97.8 F (36.6 C) 98.6 F (37 C)  TempSrc: Oral  Oral Oral  SpO2: 95%  95% 97%  Weight:      Height:        Intake/Output Summary (Last 24 hours) at 05/26/2021 1817 Last data filed at 05/26/2021 1727 Gross per 24 hour  Intake 100 ml  Output --  Net 100 ml   Filed Weights   05/25/21 1023  Weight: 90 kg    Examination:  General exam: Appears calm and comfortable  Respiratory system: Fair air movement.  No wheezes, no crackles, no rhonchi.  Speaking in full sentences. Cardiovascular system: Regular rate rhythm no gallops.  No JVD.  No lower extremity edema.  Gastrointestinal system: Abdomen is nondistended, soft and nontender. No organomegaly  or masses felt. Normal bowel sounds heard. Central nervous system: Alert and oriented. No focal neurological deficits. Extremities: Symmetric 5 x 5 power. Skin: No rashes, lesions or ulcers Psychiatry: Judgement and insight appear normal. Mood & affect appropriate.     Data Reviewed: I have personally reviewed following labs and imaging studies  CBC: Recent Labs  Lab 05/25/21 1017 05/25/21 1805 05/26/21 0542  WBC  10.3 6.4 6.8  NEUTROABS 7.2  --  5.4  HGB 12.8 12.1 12.4  HCT 38.5 37.2 37.5  MCV 92.8 93.7 94.0  PLT 264 273 [id number]    Basic Metabolic Panel: Recent Labs  Lab 05/25/21 1017 05/25/21 1805 05/26/21 0542  NA 135  --  136  K 3.7  --  3.6  CL 100  --  105  CO2 23  --  22  GLUCOSE 145*  --  199*  BUN 23  --  35*  CREATININE 1.17* 1.13* 1.15*  CALCIUM 9.4  --  8.9    GFR: Estimated Creatinine Clearance: 38.7 mL/min (A) (by C-G formula based on SCr of 1.15 mg/dL (H)).  Liver Function Tests: Recent Labs  Lab 05/25/21 1017 05/26/21 0542  AST 61* 67*  ALT 21 24  ALKPHOS 120 103  BILITOT 0.9 0.6  PROT 8.7* 7.6  ALBUMIN 4.1 3.4*    CBG: No results for input(s): GLUCAP in the last 168 hours.   Recent Results (from the past 240 hour(s))  Resp Panel by RT-PCR (Flu A&B, Covid) Nasopharyngeal Swab     Status: Abnormal   Collection Time: 05/25/21 11:34 AM   Specimen: Nasopharyngeal Swab; Nasopharyngeal(NP) swabs in vial transport medium  Result Value Ref Range Status   SARS Coronavirus 2 by RT PCR POSITIVE (A) NEGATIVE Final    Comment: RESULT CALLED TO, READ BACK BY AND VERIFIED WITH: Lynford Humphrey RN ON 05/25/2021 @1319  BY MECIAL J. (NOTE) SARS-CoV-2 target nucleic acids are DETECTED.  The SARS-CoV-2 RNA is generally detectable in upper respiratory specimens during the acute phase of infection. Positive results are indicative of the presence of the identified virus, but do not rule out bacterial infection or co-infection with other pathogens not detected by the test. Clinical correlation with patient history and other diagnostic information is necessary to determine patient infection status. The expected result is Negative.  Fact Sheet for Patients: EntrepreneurPulse.com.au  Fact Sheet for Healthcare Providers: IncredibleEmployment.be  This test is not yet approved or cleared by the [place] FDA and  has been authorized for  detection and/or diagnosis of SARS-CoV-2 by FDA under an Emergency Use Authorization (EUA).  This EUA will remain in effect (meaning t his test can be used) for the duration of  the COVID-19 declaration under Section 564(b)(1) of the Act, 21 U.S.C. section 360bbb-3(b)(1), unless the authorization is terminated or revoked sooner.     Influenza A by PCR NEGATIVE NEGATIVE Final   Influenza B by PCR NEGATIVE NEGATIVE Final    Comment: (NOTE) The Xpert Xpress SARS-CoV-2/FLU/RSV plus assay is intended as an aid in the diagnosis of influenza from Nasopharyngeal swab specimens and should not be used as a sole basis for treatment. Nasal washings and aspirates are unacceptable for Xpert Xpress SARS-CoV-2/FLU/RSV testing.  Fact Sheet for Patients: EntrepreneurPulse.com.au  Fact Sheet for Healthcare Providers: IncredibleEmployment.be  This test is not yet approved or cleared by the [place] FDA and has been authorized for detection and/or diagnosis of SARS-CoV-2 by FDA under an Emergency Use Authorization (EUA). This EUA will remain in effect (  meaning this test can be used) for the duration of the COVID-19 declaration under Section 564(b)(1) of the Act, 21 U.S.C. section 360bbb-3(b)(1), unless the authorization is terminated or revoked.  Performed at [facility], [address] [place], [place] [address]   Urine Culture     Status: None   Collection Time: 05/25/21 11:54 AM   Specimen: In/Out Cath Urine  Result Value Ref Range Status   Specimen Description   Final    IN/OUT CATH URINE Performed at [facility], [address] [place], [place] [address]    Special Requests   Final    NONE Performed at [facility], [address] [place], [place] [address]    Culture   Final    NO GROWTH Performed at [facility], [address] [place], [place] [address]    Report Status  05/26/2021 FINAL  Final  Blood Culture (routine x 2)     Status: None (Preliminary result)   Collection Time: 05/25/21  1:58 PM   Specimen: BLOOD  Result Value Ref Range Status   Specimen Description   Final    BLOOD LEFT ANTECUBITAL Performed at [facility], [address] [place], [place] [address]    Special Requests   Final    BOTTLES DRAWN AEROBIC AND ANAEROBIC Blood Culture adequate volume Performed at [facility] [place], [address] [place], [place] [address]    Culture   Final    NO GROWTH < 24 HOURS Performed at [facility], [address] [place], [place] [address]    Report Status PENDING  Incomplete  Blood Culture (routine x 2)     Status: None (Preliminary result)   Collection Time: 05/25/21  2:03 PM   Specimen: BLOOD  Result Value Ref Range Status   Specimen Description   Final    BLOOD BLOOD RIGHT HAND Performed at [facility], [address] [place], [place] [address]    Special Requests   Final    BOTTLES DRAWN AEROBIC AND ANAEROBIC Blood Culture adequate volume Performed at [facility], [address] [place], [place] [address]    Culture   Final    NO GROWTH < 24 HOURS Performed at [facility], [address] [place], [place] [address]    Report Status PENDING  Incomplete         Radiology Studies: CT Head Wo Contrast  Result Date: 05/25/2021 CLINICAL DATA:  Head trauma, moderate to severe.  Fell from bed. EXAM: CT HEAD WITHOUT CONTRAST TECHNIQUE: Contiguous axial images were obtained from the base of the skull through the vertex without intravenous contrast. COMPARISON:  MRI 11/18/2016.  CT 05/08/2014. FINDINGS: Brain: No CT evidence of acute infarction. There is chronic brain atrophy with extensive chronic small-vessel ischemic changes throughout the hemispheric white matter. There is ventriculomegaly, similar to the MRI of 2018 but progressive when  compared to the CT of 2015. Whereas ventriculomegaly may relate to central atrophy, normal pressure hydrocephalus is not excluded on the basis of the imaging. No sign of mass, hemorrhage or extra-axial collection. Vascular: There is atherosclerotic calcification of the major vessels at the base of the brain. Skull: Negative Sinuses/Orbits: Mild mucosal thickening of the maxillary sinuses. Tiny amount of fluid in the sphenoid sinus. Orbits negative. Other: None IMPRESSION: No acute CT finding. Generalized atrophy. Advanced chronic small-vessel ischemic changes of the white matter. Chronic ventriculomegaly, similar  to the MRI of 2018 but advanced compared to the CT of 2015. Findings may be due to central atrophy, but chronic normal pressure hydrocephalus is not excluded by the imaging. Electronically Signed   By: Paulina Fusi M.D.   On: 05/25/2021 11:41   CT Angio Chest Pulmonary Embolism (PE) W or WO Contrast  Result Date: 05/25/2021 CLINICAL DATA:  Shortness of breath, COVID positive. EXAM: CT ANGIOGRAPHY CHEST WITH CONTRAST TECHNIQUE: Multidetector CT imaging of the chest was performed using the standard protocol during bolus administration of intravenous contrast. Multiplanar CT image reconstructions and MIPs were obtained to evaluate the vascular anatomy. CONTRAST:  68mL OMNIPAQUE IOHEXOL 350 MG/ML SOLN COMPARISON:  None. FINDINGS: Cardiovascular: No filling defects in the pulmonary arteries to suggest pulmonary emboli. Cardio may. Coronary artery and aortic calcifications. No aneurysm. Mediastinum/Nodes: No mediastinal, hilar, or axillary adenopathy. Trachea and esophagus are unremarkable. Thyroid unremarkable. Moderate-sized hiatal hernia. Lungs/Pleura: Dependent bibasilar atelectasis. No effusions or other confluent airspace opacity. Upper Abdomen: Imaging into the upper abdomen demonstrates no acute findings. Musculoskeletal: Chest wall soft tissues are unremarkable. No acute bony abnormality. Review of  the MIP images confirms the above findings. IMPRESSION: No evidence of pulmonary embolus. Cardiomegaly.  Coronary artery disease. Dependent bibasilar atelectasis. Moderate-sized hiatal hernia. Aortic Atherosclerosis (ICD10-I70.0). Electronically Signed   By: Charlett Nose M.D.   On: 05/25/2021 22:03   CT Cervical Spine Wo Contrast  Result Date: 05/25/2021 CLINICAL DATA:  Larey Seat from the bed.  Trauma to the head and neck. EXAM: CT CERVICAL SPINE WITHOUT CONTRAST TECHNIQUE: Multidetector CT imaging of the cervical spine was performed without intravenous contrast. Multiplanar CT image reconstructions were also generated. COMPARISON:  None. FINDINGS: Alignment: Mild scoliotic curvature.  No traumatic malalignment. Skull base and vertebrae: No fracture or focal bone lesion. Soft tissues and spinal canal: No significant soft tissue neck finding. Disc levels: Ordinary osteoarthritis at the C1-2 articulation. No encroachment upon the neural structures. Degenerative spondylosis at C5-6 and C6-7 but without apparent compressive bony narrowing of the canal or foramina. Upper chest: Negative Other: None IMPRESSION: No acute or traumatic finding. Ordinary chronic degenerative spondylosis at C5-6 and C6-7. Electronically Signed   By: Paulina Fusi M.D.   On: 05/25/2021 11:43   DG Pelvis Portable  Result Date: 05/25/2021 CLINICAL DATA:  Fall. Weakness and shortness of breath over the last 3 days. EXAM: PORTABLE PELVIS 1-2 VIEWS COMPARISON:  None. FINDINGS: There is no evidence of pelvic fracture or diastasis. No pelvic bone lesions are seen. IMPRESSION: Negative. Electronically Signed   By: Norva Pavlov M.D.   On: 05/25/2021 11:01   DG Chest Portable 1 View  Result Date: 05/25/2021 CLINICAL DATA:  Shortness of breath.  Increased weakness. EXAM: PORTABLE CHEST 1 VIEW COMPARISON:  10/17/2019 and chest CT 11/06/2014 FINDINGS: Again noted is elevation of the right hemidiaphragm. Increased right basilar chest densities.  Otherwise, the lungs are clear. Retrocardiac opacity could be related to thoracic aorta or a hiatal hernia. Heart size is within normal limits and stable. Atherosclerotic calcifications at the aortic arch. IMPRESSION: Increased right basilar densities are most compatible with atelectasis. Otherwise, no acute chest findings. Electronically Signed   By: Richarda Overlie M.D.   On: 05/25/2021 10:49        Scheduled Meds:  amLODipine  10 mg Oral QHS   vitamin C  500 mg Oral Daily   atorvastatin  20 mg Oral Daily   citalopram  10 mg Oral q morning   memantine  28 mg  Oral QHS   And   donepezil  10 mg Oral QHS   enoxaparin (LOVENOX) injection  40 mg Subcutaneous Q24H   Ipratropium-Albuterol  1 puff Inhalation Q6H   labetalol  300 mg Oral BID   lisinopril  40 mg Oral QHS   methylPREDNISolone (SOLU-MEDROL) injection  60 mg Intravenous Q12H   mometasone-formoterol  2 puff Inhalation BID   pantoprazole  40 mg Oral Q0600   QUEtiapine  50 mg Oral QHS   zinc sulfate  220 mg Oral Daily   Continuous Infusions:  remdesivir 100 mg in NS 100 mL 100 mg (05/26/21 1025)     LOS: 1 day    Time spent: 35 minutes    Irine Seal, MD [facility]   To contact the attending provider between 7A-7P or the covering provider during after hours 7P-7A, please log into the web site [email or web address] and access using universal [place] password for that web site. If you do not have the password, please call the hospital operator.  05/26/2021, 6:17 PM

## 2021-05-26 NOTE — Evaluation (Signed)
Physical Therapy Evaluation Patient Details Name: Teresa Riley MRN: [id number] DOB: [birth year] Today's Date: 05/26/2021  History of Present Illness  Teresa Riley is a 86 y.o. female with medical history significant of HTN, dementia, CKD, HLD. Presenting with multiple falls and weakness, hypoxia, positive for Covid. ambulatory at baseline, lives with daughter  Clinical Impression  Patient pleasant and resting in bed, Orient ed to self and in hospital. Daughter in room to provide functional history.  At baseline, patient ambulatory in home with RW, toilets and showers with only supervision. Daughter plans for return home.  Patient on 2 L [place] at 97%. During mobility, on RA SPO2  915, mild wheeze and 32-4 dyspnea. Patient should progress to return home with 24/7 caregivers.Continue PT for progressive mobility       Recommendations for follow up therapy are one component of a multi-disciplinary discharge planning process, led by the attending physician.  Recommendations may be updated based on patient status, additional functional criteria and insurance authorization.  Follow Up Recommendations Home health PT    Assistance Recommended at Discharge Frequent or constant Supervision/Assistance  Functional Status Assessment Patient has had a recent decline in their functional status and demonstrates the ability to make significant improvements in function in a reasonable and predictable amount of time.  Equipment Recommendations  None recommended by PT    Recommendations for Other Services       Precautions / Restrictions Precautions Precautions: Fall Precaution Comments: monitor sats      Mobility  Bed Mobility Overal bed mobility: Needs Assistance Bed Mobility: Supine to Sit     Supine to sit: Mod assist     General bed mobility comments: multimodal cues to move legs over bed edge, mod assist for trunk to upright, patient noted with tremors of  extrmities.    Transfers Overall  transfer level: Needs assistance Equipment used: 1 person hand held assist Transfers: Sit to/from Stand;Bed to chair/wheelchair/BSC Sit to Stand: Mod assist Stand pivot transfers: Mod assist         General transfer comment: multimodal cues to stand from bed, asmall shuffle steps to pivot to [facility] then to recliner, hand over hand to reach for armrests.    Ambulation/Gait                  Stairs            Wheelchair Mobility    Modified Rankin (Stroke Patients Only)       Balance Overall balance assessment: History of Falls;Needs assistance Sitting-balance support: Feet supported;No upper extremity supported Sitting balance-Leahy Scale: Fair     Standing balance support: During functional activity;Single extremity supported Standing balance-Leahy Scale: Poor Standing balance comment: reliant on support                             Pertinent Vitals/Pain Pain Assessment: Faces Faces Pain Scale: No hurt    Home Living Family/patient expects to be discharged to:: Private residence Living Arrangements: Children Available Help at Discharge: Available 24 hours/day Type of Home: House Home Access: Stairs to enter Entrance Stairs-Rails: Right Entrance Stairs-Number of Steps: 4-5   Home Layout: Able to live on main level with bedroom/bathroom Home Equipment: Agricultural consultant (2 wheels);Shower seat - built in      Prior Function Prior Level of Function : History of Falls (last six months);Needs assist  Cognitive Assist : ADLs (cognitive)   ADLs (Cognitive): Intermittent cues Physical Assist :  Mobility (physical)     Mobility Comments: per daughter,up with RW ad lib, touilets independnetly, takes on shower.       Hand Dominance        Extremity/Trunk Assessment        Lower Extremity Assessment Lower Extremity Assessment: Generalized weakness (tremulous standing)    Cervical / Trunk Assessment Cervical / Trunk Assessment: Normal   Communication      Cognition Arousal/Alertness: Awake/alert Behavior During Therapy: WFL for tasks assessed/performed Overall Cognitive Status: History of cognitive impairments - at baseline                                 General Comments: Follows simple directions and tactile cues for activity        General Comments      Exercises     Assessment/Plan    PT Assessment Patient needs continued PT services  PT Problem List Decreased strength;Decreased mobility;Decreased safety awareness;Decreased knowledge of precautions;Decreased activity tolerance;Decreased cognition;Decreased balance       PT Treatment Interventions DME instruction;Therapeutic activities;Cognitive remediation;Gait training;Therapeutic exercise;Patient/family education;Functional mobility training;Stair training    PT Goals (Current goals can be found in the Care Plan section)  Acute Rehab PT Goals Patient Stated Goal: to return home PT Goal Formulation: With patient/family Time For Goal Achievement: 06/09/21 Potential to Achieve Goals: Good    Frequency Min 3X/week   Barriers to discharge        Co-evaluation               AM-PAC PT "6 Clicks" Mobility  Outcome Measure Help needed turning from your back to your side while in a flat bed without using bedrails?: A Lot Help needed moving from lying on your back to sitting on the side of a flat bed without using bedrails?: A Lot Help needed moving to and from a bed to a chair (including a wheelchair)?: A Lot Help needed standing up from a chair using your arms (e.g., wheelchair or bedside chair)?: A Lot Help needed to walk in hospital room?: Total Help needed climbing 3-5 steps with a railing? : Total 6 Click Score: 10    End of Session   Activity Tolerance: Patient tolerated treatment well Patient left: in chair;with call bell/phone within reach;with family/visitor present;with chair alarm set Nurse Communication: Mobility  status PT Visit Diagnosis: Unsteadiness on feet (R26.81);Difficulty in walking, not elsewhere classified (R26.2);Repeated falls (R29.6)    Time: 4481-8563 PT Time Calculation (min) (ACUTE ONLY): 29 min   Charges:   PT Evaluation $PT Eval Low Complexity: 1 Low PT Treatments $Therapeutic Activity: 8-22 mins        Blanchard Kelch PT Acute Rehabilitation Services Pager [phone] Office [phone]    Rada Hay 05/26/2021, 3:30 PM

## 2021-05-27 LAB — CBC WITH DIFFERENTIAL/PLATELET
Abs Immature Granulocytes: 0.05 10*3/uL (ref 0.00–0.07)
Basophils Absolute: 0 10*3/uL (ref 0.0–0.1)
Basophils Relative: 0 %
Eosinophils Absolute: 0 10*3/uL (ref 0.0–0.5)
Eosinophils Relative: 0 %
HCT: 36.2 % (ref 36.0–46.0)
Hemoglobin: 11.7 g/dL — ABNORMAL LOW (ref 12.0–15.0)
Immature Granulocytes: 1 %
Lymphocytes Relative: 12 %
Lymphs Abs: 1.2 10*3/uL (ref 0.7–4.0)
MCH: 30.5 pg (ref 26.0–34.0)
MCHC: 32.3 g/dL (ref 30.0–36.0)
MCV: 94.3 fL (ref 80.0–100.0)
Monocytes Absolute: 0.3 10*3/uL (ref 0.1–1.0)
Monocytes Relative: 3 %
Neutro Abs: 8.2 10*3/uL — ABNORMAL HIGH (ref 1.7–7.7)
Neutrophils Relative %: 84 %
Platelets: 246 10*3/uL (ref 150–400)
RBC: 3.84 MIL/uL — ABNORMAL LOW (ref 3.87–5.11)
RDW: 14.1 % (ref 11.5–15.5)
WBC: 9.7 10*3/uL (ref 4.0–10.5)
nRBC: 0.2 % (ref 0.0–0.2)

## 2021-05-27 LAB — COMPREHENSIVE METABOLIC PANEL
ALT: 24 U/L (ref 0–44)
AST: 57 U/L — ABNORMAL HIGH (ref 15–41)
Albumin: 3.2 g/dL — ABNORMAL LOW (ref 3.5–5.0)
Alkaline Phosphatase: 96 U/L (ref 38–126)
Anion gap: 8 (ref 5–15)
BUN: 36 mg/dL — ABNORMAL HIGH (ref 8–23)
CO2: 25 mmol/L (ref 22–32)
Calcium: 8.9 mg/dL (ref 8.9–10.3)
Chloride: 105 mmol/L (ref 98–111)
Creatinine, Ser: 1.22 mg/dL — ABNORMAL HIGH (ref 0.44–1.00)
GFR, Estimated: 44 mL/min — ABNORMAL LOW (ref >60)
Glucose, Bld: 197 mg/dL — ABNORMAL HIGH (ref 70–99)
Potassium: 3.9 mmol/L (ref 3.5–5.1)
Sodium: 138 mmol/L (ref 135–145)
Total Bilirubin: 0.6 mg/dL (ref 0.3–1.2)
Total Protein: 7 g/dL (ref 6.5–8.1)

## 2021-05-27 LAB — D-DIMER, QUANTITATIVE: D-Dimer, Quant: 1.98 ug/mL-FEU — ABNORMAL HIGH (ref 0.00–0.50)

## 2021-05-27 LAB — FERRITIN: Ferritin: 105 ng/mL (ref 11–307)

## 2021-05-27 LAB — C-REACTIVE PROTEIN: CRP: 1.7 mg/dL — ABNORMAL HIGH (ref <1.0)

## 2021-05-27 LAB — MAGNESIUM: Magnesium: 2.4 mg/dL (ref 1.7–2.4)

## 2021-05-27 MED ORDER — HYDRALAZINE HCL 25 MG PO TABS: 25.0000 mg | ORAL_TABLET | Freq: Three times a day (TID) | ORAL | Status: DC | PRN

## 2021-05-27 MED ORDER — METHYLPREDNISOLONE SODIUM SUCC 125 MG IJ SOLR
60.0000 mg | INTRAMUSCULAR | Status: DC
Start: 2021-05-28 — End: 2021-05-29
  Administered 2021-05-28 – 2021-05-29 (×2): 60 mg via INTRAVENOUS
  Filled 2021-05-27 (×2): qty 2

## 2021-05-27 NOTE — Evaluation (Signed)
Occupational Therapy Evaluation Patient Details Name: Teresa Riley MRN: [id number] DOB: 1934-07-19 Today's Date: 05/27/2021   History of Present Illness Teresa Riley is a 86 y.o. female with medical history significant of HTN, dementia, CKD, HLD. Presenting with multiple falls and weakness, hypoxia, positive for Covid. ambulatory at baseline, lives with daughter   Clinical Impression   Patient was noted to have improved on transfers on this date with remaining balance deficits with attempted one UE support in standing. Patient was able to transfer to [facility] with RW with min A with continued cues for sequencing. Patient to work on standing balance and tolerance during next session to increase independence in ADLs. Patient's discharge plan remains appropriate at this time. OT will continue to follow acutely.        Recommendations for follow up therapy are one component of a multi-disciplinary discharge planning process, led by the attending physician.  Recommendations may be updated based on patient status, additional functional criteria and insurance authorization.   Follow Up Recommendations  Home health OT    Assistance Recommended at Discharge Frequent or constant Supervision/Assistance  Functional Status Assessment     Equipment Recommendations  Other (comment)    Recommendations for Other Services       Precautions / Restrictions Precautions Precautions: Fall Precaution Comments: monitor sats Restrictions Weight Bearing Restrictions: No      Mobility Bed Mobility Overal bed mobility: Needs Assistance Bed Mobility: Supine to Sit     Supine to sit: Min guard     General bed mobility comments: min guard with cues for bed mobility.    Transfers Overall transfer level: Needs assistance Equipment used: Rolling Fielder (2 wheels) Transfers: Sit to/from Stand Sit to Stand: Min assist Stand pivot transfers: Min assist         General transfer comment: with  continuous cues for sequencing of task with RW.      Balance Overall balance assessment: History of Falls;Needs assistance Sitting-balance support: Feet supported;No upper extremity supported Sitting balance-Leahy Scale: Fair     Standing balance support: During functional activity;Single extremity supported;Reliant on assistive device for balance Standing balance-Leahy Scale: Poor                             ADL either performed or assessed with clinical judgement   ADL Overall ADL's : Needs assistance/impaired                         Toilet Transfer: Minimal assistance;Rolling Kazmierczak (2 wheels);BSC/3in1 Toilet Transfer Details (indicate cue type and reason): patient was able to transfer to [facility] with min A with verbal cues for sequencing of all tasks with RW. Toileting- Clothing Manipulation and Hygiene: Moderate assistance Toileting - Clothing Manipulation Details (indicate cue type and reason): patient attempted to help with hygiene in standing but reported typically sits to complete task at home. patient needed mod A to complete task with min A for standing balance with one UE support.       General ADL Comments: patient was able to complete transfer from [facility] to recliner with full turn with RW with min A to increase independence in ADLs.     Vision Patient Visual Report: No change from baseline       Perception     Praxis      Pertinent Vitals/Pain Pain Assessment: No/denies pain     Hand Dominance  Extremity/Trunk Assessment Upper Extremity Assessment Upper Extremity Assessment: Overall WFL for tasks assessed   Lower Extremity Assessment Lower Extremity Assessment: Defer to PT evaluation   Cervical / Trunk Assessment Cervical / Trunk Assessment: Normal   Communication     Cognition   Behavior During Therapy: WFL for tasks assessed/performed Overall Cognitive Status: History of cognitive impairments - at baseline                                  General Comments: Follows simple directions and tactile cues for activity     General Comments       Exercises     Shoulder Instructions      Home Living                                          Prior Functioning/Environment                          OT Problem List:        OT Treatment/Interventions:      OT Goals(Current goals can be found in the care plan section) Acute Rehab OT Goals Patient Stated Goal: to go home  OT Frequency: Min 2X/week   Barriers to D/C:            Co-evaluation              AM-PAC OT "6 Clicks" Daily Activity     Outcome Measure Help from another person eating meals?: A Little Help from another person taking care of personal grooming?: A Little Help from another person toileting, which includes using toliet, bedpan, or urinal?: A Little Help from another person bathing (including washing, rinsing, drying)?: A Lot Help from another person to put on and taking off regular upper body clothing?: A Little Help from another person to put on and taking off regular lower body clothing?: A Lot 6 Click Score: 16   End of Session Equipment Utilized During Treatment: Rolling Kosak (2 wheels);Gait belt Nurse Communication: Mobility status  Activity Tolerance: Patient tolerated treatment well Patient left: in chair;with call bell/phone within reach;with chair alarm set;with family/visitor present  OT Visit Diagnosis: Unsteadiness on feet (R26.81);Other abnormalities of gait and mobility (R26.89);History of falling (Z91.81)                Time: 8469-6295 OT Time Calculation (min): 38 min Charges:  OT General Charges $OT Visit: 1 Visit OT Treatments $Self Care/Home Management : 38-52 mins  Sharyn Blitz OTR/L, MS Acute Rehabilitation Department Office# [phone] Pager# [phone]   Chalmers Guest Mussa Groesbeck 05/27/2021, 12:02 PM

## 2021-05-27 NOTE — Progress Notes (Signed)
PROGRESS NOTE    Teresa Riley  [id number] DOB: [birth year] DOA: 05/25/2021 PCP: Biagio Borg, MD    Chief Complaint  Patient presents with   Respiratory Distress   Fall   Weakness    Brief Narrative:    Patient is a 86 year old female history of hypertension, dementia, CKD, hyperlipidemia presented to the ED for multiple falls, generalized weakness.  Patient noted to have had a runny nose, cough, some wheezing for 3 days prior to admission.  Patient noted to have stumbled and lost her footing and had a fall prior to admission, did not have any syncopal episode or any head injury.  Patient daughter checked on patient the morning of admission and patient found on the floor with some labored breathing.  Patient seen in the ED noted to be hypoxic on room air, placed on O2.  COVID-19 PCR obtained was positive.  Patient started on IV remdesivir and IV steroids.   Assessment & Plan:   Principal Problem:   Acute respiratory failure due to COVID-19 [facility]) Active Problems:   HTN (hypertension)   Hyperlipidemia   Dementia (HCC)   General weakness   CKD (chronic kidney disease) stage 3, GFR 30-59 ml/min (HCC)   COVID-19   Acute hypoxic respiratory failure secondary to acute COVID-19 infection COVID-19 PCR is positive, influenza PCR is negative.  Patient initially required oxygen and currently weaned off oxygen. Continue with IV remdesivir to complete the course.  Patient was started on IV Solu-Medrol 60 mg twice daily, transition to 60 mg daily.  Bronchodilators as needed.  Patient is alert and answering questions appropriately. Improving CRP levels.  Hypertension Blood pressure parameters appear to be optimal.    Dementia can Continue with Namenda and Aricept.    Stage IIIa CKD Creatinine appears to be at base Baseline creatinine ranging from 1-1.3. Creatinine at 1.2 today. Continue to monitor    Generalized weakness probably secondary to COVID infection Therapy  evaluations recommending home health PT and OT   DVT prophylaxis: (Lovenox/) Code Status: full code.  Family Communication: none at bedside.  Disposition:   Status is: Inpatient  Remains inpatient appropriate because: IV REMDESEVIR,        Consultants:  NONE.   Procedures: NONE.   Antimicrobials: Antibiotics Given (last 72 hours)     Date/Time Action Medication Dose Rate   05/25/21 1506 New Bag/Given   remdesivir 200 mg in sodium chloride 0.9% 250 mL IVPB 200 mg 580 mL/hr   05/26/21 1025 New Bag/Given   remdesivir 100 mg in sodium chloride 0.9 % 100 mL IVPB 100 mg 200 mL/hr   05/27/21 0920 New Bag/Given   remdesivir 100 mg in sodium chloride 0.9 % 100 mL IVPB 100 mg 200 mL/hr        Subjective: Breathing better.   Objective: Vitals:   05/26/21 1421 05/26/21 2023 05/27/21 0534 05/27/21 1121  BP: (!) 174/86 (!) 171/85 (!) 178/94   Pulse: 76 73    Resp: 14 20 20    Temp: 98.6 F (37 C) 98.7 F (37.1 C) 97.8 F (36.6 C)   TempSrc: Oral Oral Oral   SpO2: 97% 98% 96% 91%  Weight:      Height:        Intake/Output Summary (Last 24 hours) at 05/27/2021 1123 Last data filed at 05/27/2021 0901 Gross per 24 hour  Intake 100 ml  Output 900 ml  Net -800 ml   Filed Weights   05/25/21 1023  Weight: 90 kg  Examination:  General exam: Appears calm and comfortable  Respiratory system: Clear to auscultation. Respiratory effort normal. On RA. No wheezing heard.  Cardiovascular system: S1 & S2 heard, RRR. No JVD,  No pedal edema. Gastrointestinal system: Abdomen is nondistended, soft and nontender.  Normal bowel sounds heard. Central nervous system: Alert and oriented TO PERSON and place,  Extremities: Symmetric 5 x 5 power. Skin: No rashes, lesions or ulcers Psychiatry: Mood & affect appropriate.     Data Reviewed: I have personally reviewed following labs and imaging studies  CBC: Recent Labs  Lab 05/25/21 1017 05/25/21 1805 05/26/21 0542  05/27/21 0508  WBC 10.3 6.4 6.8 9.7  NEUTROABS 7.2  --  5.4 8.2*  HGB 12.8 12.1 12.4 11.7*  HCT 38.5 37.2 37.5 36.2  MCV 92.8 93.7 94.0 94.3  PLT 264 273 228 0000000    Basic Metabolic Panel: Recent Labs  Lab 05/25/21 1017 05/25/21 1805 05/26/21 0542 05/27/21 0508  NA 135  --  136 138  K 3.7  --  3.6 3.9  CL 100  --  105 105  CO2 23  --  22 25  GLUCOSE 145*  --  199* 197*  BUN 23  --  35* 36*  CREATININE 1.17* 1.13* 1.15* 1.22*  CALCIUM 9.4  --  8.9 8.9  MG  --   --   --  2.4    GFR: Estimated Creatinine Clearance: 36.5 mL/min (A) (by C-G formula based on SCr of 1.22 mg/dL (H)).  Liver Function Tests: Recent Labs  Lab 05/25/21 1017 05/26/21 0542 05/27/21 0508  AST 61* 67* 57*  ALT 21 24 24   ALKPHOS 120 103 96  BILITOT 0.9 0.6 0.6  PROT 8.7* 7.6 7.0  ALBUMIN 4.1 3.4* 3.2*    CBG: No results for input(s): GLUCAP in the last 168 hours.   Recent Results (from the past 240 hour(s))  Resp Panel by RT-PCR (Flu A&B, Covid) Nasopharyngeal Swab     Status: Abnormal   Collection Time: 05/25/21 11:34 AM   Specimen: Nasopharyngeal Swab; Nasopharyngeal(NP) swabs in vial transport medium  Result Value Ref Range Status   SARS Coronavirus 2 by RT PCR POSITIVE (A) NEGATIVE Final    Comment: RESULT CALLED TO, READ BACK BY AND VERIFIED WITH: Lynford Humphrey RN ON 05/25/2021 @1319  BY MECIAL J. (NOTE) SARS-CoV-2 target nucleic acids are DETECTED.  The SARS-CoV-2 RNA is generally detectable in upper respiratory specimens during the acute phase of infection. Positive results are indicative of the presence of the identified virus, but do not rule out bacterial infection or co-infection with other pathogens not detected by the test. Clinical correlation with patient history and other diagnostic information is necessary to determine patient infection status. The expected result is Negative.  Fact Sheet for Patients: EntrepreneurPulse.com.au  Fact Sheet for  Healthcare Providers: IncredibleEmployment.be  This test is not yet approved or cleared by the [place] FDA and  has been authorized for detection and/or diagnosis of SARS-CoV-2 by FDA under an Emergency Use Authorization (EUA).  This EUA will remain in effect (meaning t his test can be used) for the duration of  the COVID-19 declaration under Section 564(b)(1) of the Act, 21 U.S.C. section 360bbb-3(b)(1), unless the authorization is terminated or revoked sooner.     Influenza A by PCR NEGATIVE NEGATIVE Final   Influenza B by PCR NEGATIVE NEGATIVE Final    Comment: (NOTE) The Xpert Xpress SARS-CoV-2/FLU/RSV plus assay is intended as an aid in the diagnosis of influenza from  Nasopharyngeal swab specimens and should not be used as a sole basis for treatment. Nasal washings and aspirates are unacceptable for Xpert Xpress SARS-CoV-2/FLU/RSV testing.  Fact Sheet for Patients: BloggerCourse.com  Fact Sheet for Healthcare Providers: SeriousBroker.it  This test is not yet approved or cleared by the [place] FDA and has been authorized for detection and/or diagnosis of SARS-CoV-2 by FDA under an Emergency Use Authorization (EUA). This EUA will remain in effect (meaning this test can be used) for the duration of the COVID-19 declaration under Section 564(b)(1) of the Act, 21 U.S.C. section 360bbb-3(b)(1), unless the authorization is terminated or revoked.  Performed at [facility], [address], [place], [place] [address]   Urine Culture     Status: None   Collection Time: 05/25/21 11:54 AM   Specimen: In/Out Cath Urine  Result Value Ref Range Status   Specimen Description   Final    IN/OUT CATH URINE Performed at [facility], [address], [place], [place] [address]    Special Requests   Final    NONE Performed at [facility], [address] [place], [place] [address]    Culture   Final    NO GROWTH Performed at [facility], [address] [place], [place] [address]    Report Status 05/26/2021 FINAL  Final  Blood Culture (routine x 2)     Status: None (Preliminary result)   Collection Time: 05/25/21  1:58 PM   Specimen: BLOOD  Result Value Ref Range Status   Specimen Description   Final    BLOOD LEFT ANTECUBITAL Performed at [facility], [address], [place], [place] [address]    Special Requests   Final    BOTTLES DRAWN AEROBIC AND ANAEROBIC Blood Culture adequate volume Performed at [facility], [address], [place], [place] [address]    Culture   Final    NO GROWTH 2 DAYS Performed at [facility], [address] [place], [place] [address]    Report Status PENDING  Incomplete  Blood Culture (routine x 2)     Status: None (Preliminary result)   Collection Time: 05/25/21  2:03 PM   Specimen: BLOOD  Result Value Ref Range Status   Specimen Description   Final    BLOOD BLOOD RIGHT HAND Performed at [facility], [address] [place], [place] [address]    Special Requests   Final    BOTTLES DRAWN AEROBIC AND ANAEROBIC Blood Culture adequate volume Performed at [facility], [address], [place], [place] [address]    Culture   Final    NO GROWTH 2 DAYS Performed at [facility], [address], [place], [place] [address]    Report Status PENDING  Incomplete         Radiology Studies: CT Head Wo Contrast  Result Date: 05/25/2021 CLINICAL DATA:  Head trauma, moderate to severe.  Fell from bed. EXAM: CT HEAD WITHOUT CONTRAST TECHNIQUE: Contiguous axial images were obtained from the base of the skull through the vertex without intravenous contrast. COMPARISON:  MRI 11/18/2016.  CT 05/08/2014. FINDINGS: Brain: No CT evidence of acute infarction. There is chronic brain atrophy with extensive  chronic small-vessel ischemic changes throughout the hemispheric white matter. There is ventriculomegaly, similar to the MRI of 2018 but progressive when compared to the CT of 2015. Whereas ventriculomegaly may relate to central atrophy, normal pressure  hydrocephalus is not excluded on the basis of the imaging. No sign of mass, hemorrhage or extra-axial collection. Vascular: There is atherosclerotic calcification of the major vessels at the base of the brain. Skull: Negative Sinuses/Orbits: Mild mucosal thickening of the maxillary sinuses. Tiny amount of fluid in the sphenoid sinus. Orbits negative. Other: None IMPRESSION: No acute CT finding. Generalized atrophy. Advanced chronic small-vessel ischemic changes of the white matter. Chronic ventriculomegaly, similar to the MRI of 2018 but advanced compared to the CT of 2015. Findings may be due to central atrophy, but chronic normal pressure hydrocephalus is not excluded by the imaging. Electronically Signed   By: Nelson Chimes M.D.   On: 05/25/2021 11:41   CT Angio Chest Pulmonary Embolism (PE) W or WO Contrast  Result Date: 05/25/2021 CLINICAL DATA:  Shortness of breath, COVID positive. EXAM: CT ANGIOGRAPHY CHEST WITH CONTRAST TECHNIQUE: Multidetector CT imaging of the chest was performed using the standard protocol during bolus administration of intravenous contrast. Multiplanar CT image reconstructions and MIPs were obtained to evaluate the vascular anatomy. CONTRAST:  2mL OMNIPAQUE IOHEXOL 350 MG/ML SOLN COMPARISON:  None. FINDINGS: Cardiovascular: No filling defects in the pulmonary arteries to suggest pulmonary emboli. Cardio may. Coronary artery and aortic calcifications. No aneurysm. Mediastinum/Nodes: No mediastinal, hilar, or axillary adenopathy. Trachea and esophagus are unremarkable. Thyroid unremarkable. Moderate-sized hiatal hernia. Lungs/Pleura: Dependent bibasilar atelectasis. No effusions or other confluent airspace opacity. Upper Abdomen:  Imaging into the upper abdomen demonstrates no acute findings. Musculoskeletal: Chest wall soft tissues are unremarkable. No acute bony abnormality. Review of the MIP images confirms the above findings. IMPRESSION: No evidence of pulmonary embolus. Cardiomegaly.  Coronary artery disease. Dependent bibasilar atelectasis. Moderate-sized hiatal hernia. Aortic Atherosclerosis (ICD10-I70.0). Electronically Signed   By: Rolm Baptise M.D.   On: 05/25/2021 22:03   CT Cervical Spine Wo Contrast  Result Date: 05/25/2021 CLINICAL DATA:  [place] from the bed.  Trauma to the head and neck. EXAM: CT CERVICAL SPINE WITHOUT CONTRAST TECHNIQUE: Multidetector CT imaging of the cervical spine was performed without intravenous contrast. Multiplanar CT image reconstructions were also generated. COMPARISON:  None. FINDINGS: Alignment: Mild scoliotic curvature.  No traumatic malalignment. Skull base and vertebrae: No fracture or focal bone lesion. Soft tissues and spinal canal: No significant soft tissue neck finding. Disc levels: Ordinary osteoarthritis at the C1-2 articulation. No encroachment upon the neural structures. Degenerative spondylosis at C5-6 and C6-7 but without apparent compressive bony narrowing of the canal or foramina. Upper chest: Negative Other: None IMPRESSION: No acute or traumatic finding. Ordinary chronic degenerative spondylosis at C5-6 and C6-7. Electronically Signed   By: Nelson Chimes M.D.   On: 05/25/2021 11:43        Scheduled Meds:  amLODipine  10 mg Oral QHS   vitamin C  500 mg Oral Daily   atorvastatin  20 mg Oral Daily   citalopram  10 mg Oral q morning   memantine  28 mg Oral QHS   And   donepezil  10 mg Oral QHS   enoxaparin (LOVENOX) injection  40 mg Subcutaneous Q24H   Ipratropium-Albuterol  1 puff Inhalation Q6H   labetalol  300 mg Oral BID   lisinopril  40 mg Oral QHS   methylPREDNISolone (SOLU-MEDROL) injection  60 mg Intravenous Q12H   mometasone-formoterol  2 puff Inhalation  BID   pantoprazole  40 mg Oral Q0600   QUEtiapine  50 mg Oral QHS   zinc sulfate  220 mg Oral Daily   Continuous Infusions:  remdesivir 100 mg in NS 100 mL 100 mg (05/27/21 0920)     LOS: 2 days        Hosie Poisson, MD [facility]   To contact the attending provider between 7A-7P or the covering provider during after hours 7P-7A, please log into the web site [email or web address] and access using universal [place] password for that web site. If you do not have the password, please call the hospital operator.  05/27/2021, 11:23 AM

## 2021-05-28 LAB — COMPREHENSIVE METABOLIC PANEL
ALT: 25 U/L (ref 0–44)
AST: 41 U/L (ref 15–41)
Albumin: 3 g/dL — ABNORMAL LOW (ref 3.5–5.0)
Alkaline Phosphatase: 83 U/L (ref 38–126)
Anion gap: 5 (ref 5–15)
BUN: 35 mg/dL — ABNORMAL HIGH (ref 8–23)
CO2: 26 mmol/L (ref 22–32)
Calcium: 8.6 mg/dL — ABNORMAL LOW (ref 8.9–10.3)
Chloride: 107 mmol/L (ref 98–111)
Creatinine, Ser: 1.05 mg/dL — ABNORMAL HIGH (ref 0.44–1.00)
GFR, Estimated: 52 mL/min — ABNORMAL LOW (ref >60)
Glucose, Bld: 134 mg/dL — ABNORMAL HIGH (ref 70–99)
Potassium: 3.7 mmol/L (ref 3.5–5.1)
Sodium: 138 mmol/L (ref 135–145)
Total Bilirubin: 0.4 mg/dL (ref 0.3–1.2)
Total Protein: 6.7 g/dL (ref 6.5–8.1)

## 2021-05-28 LAB — CBC WITH DIFFERENTIAL/PLATELET
Abs Immature Granulocytes: 0.06 10*3/uL (ref 0.00–0.07)
Basophils Absolute: 0 10*3/uL (ref 0.0–0.1)
Basophils Relative: 0 %
Eosinophils Absolute: 0 10*3/uL (ref 0.0–0.5)
Eosinophils Relative: 0 %
HCT: 36.5 % (ref 36.0–46.0)
Hemoglobin: 11.8 g/dL — ABNORMAL LOW (ref 12.0–15.0)
Immature Granulocytes: 1 %
Lymphocytes Relative: 19 %
Lymphs Abs: 1.7 10*3/uL (ref 0.7–4.0)
MCH: 30.3 pg (ref 26.0–34.0)
MCHC: 32.3 g/dL (ref 30.0–36.0)
MCV: 93.8 fL (ref 80.0–100.0)
Monocytes Absolute: 0.7 10*3/uL (ref 0.1–1.0)
Monocytes Relative: 7 %
Neutro Abs: 6.5 10*3/uL (ref 1.7–7.7)
Neutrophils Relative %: 73 %
Platelets: 243 10*3/uL (ref 150–400)
RBC: 3.89 MIL/uL (ref 3.87–5.11)
RDW: 14 % (ref 11.5–15.5)
WBC: 8.9 10*3/uL (ref 4.0–10.5)
nRBC: 0.2 % (ref 0.0–0.2)

## 2021-05-28 LAB — GLUCOSE, CAPILLARY: Glucose-Capillary: 196 mg/dL — ABNORMAL HIGH (ref 70–99)

## 2021-05-28 LAB — FERRITIN: Ferritin: 107 ng/mL (ref 11–307)

## 2021-05-28 LAB — C-REACTIVE PROTEIN: CRP: 0.7 mg/dL (ref <1.0)

## 2021-05-28 LAB — D-DIMER, QUANTITATIVE: D-Dimer, Quant: 1.67 ug/mL-FEU — ABNORMAL HIGH (ref 0.00–0.50)

## 2021-05-28 MED ORDER — IPRATROPIUM-ALBUTEROL 20-100 MCG/ACT IN AERS
1.0000 | INHALATION_SPRAY | Freq: Three times a day (TID) | RESPIRATORY_TRACT | Status: DC
  Administered 2021-05-28 – 2021-05-29 (×4): 1 via RESPIRATORY_TRACT

## 2021-05-28 NOTE — Progress Notes (Signed)
Pt had a 8 beat run of V-Tach per central telemetry. MD Blake Divine notified. VSS. Pt denied any chest pain, SOB, lightheadedness, and dizziness. EKG completed and placed in pt chart for MD to review. MD Blake Divine notified, via in person, of EKG strip.

## 2021-05-28 NOTE — Progress Notes (Signed)
Physical Therapy Treatment Patient Details Name: Teresa Riley MRN: [id number] DOB: October 26, 1934 Today's Date: 05/28/2021   History of Present Illness Teresa Riley is a 86 y.o. female with medical history significant of HTN, dementia, CKD, HLD. Presenting with multiple falls and weakness, hypoxia, positive for Covid. ambulatory at baseline, lives with daughter    PT Comments    Pt is progressing well with mobility, she ambulated 68' with RW with min/guard assist and VCs to maneuver RW around obstacles. SaO2 97% on room air with ambulation, no loss of balance. Pt's 2 daughters present during PT session.     Recommendations for follow up therapy are one component of a multi-disciplinary discharge planning process, led by the attending physician.  Recommendations may be updated based on patient status, additional functional criteria and insurance authorization.  Follow Up Recommendations  Home health PT     Assistance Recommended at Discharge Frequent or constant Supervision/Assistance  Equipment Recommendations  None recommended by PT    Recommendations for Other Services       Precautions / Restrictions Precautions Precautions: Fall Precaution Comments: monitor sats Restrictions Weight Bearing Restrictions: No     Mobility  Bed Mobility               General bed mobility comments: up in recliner    Transfers Overall transfer level: Needs assistance Equipment used: Rolling [place] (2 wheels) Transfers: Sit to/from Stand Sit to Stand: Min guard           General transfer comment: VCs hand placement    Ambulation/Gait Ambulation/Gait assistance: Min guard Gait Distance (Feet): 70 Feet Assistive device: Rolling [place] (2 wheels) Gait Pattern/deviations: Step-through pattern;Decreased stride length Gait velocity: WFL     General Gait Details: steady with RW, VCs to negotiate obstacles, SaO2 97% on room air, no loss of balance   Stairs              Wheelchair Mobility    Modified Rankin (Stroke Patients Only)       Balance Overall balance assessment: History of Falls;Needs assistance Sitting-balance support: Feet supported;No upper extremity supported Sitting balance-Leahy Scale: Fair     Standing balance support: During functional activity;Single extremity supported;Reliant on assistive device for balance Standing balance-Leahy Scale: Poor Standing balance comment: reliant on support                            Cognition Arousal/Alertness: Awake/alert Behavior During Therapy: WFL for tasks assessed/performed Overall Cognitive Status: History of cognitive impairments - at baseline                                 General Comments: Follows simple directions and tactile cues for activity        Exercises      General Comments        Pertinent Vitals/Pain Pain Assessment: No/denies pain    Home Living                          Prior Function            PT Goals (current goals can now be found in the care plan section) Acute Rehab PT Goals Patient Stated Goal: to return home PT Goal Formulation: With patient/family Time For Goal Achievement: 06/09/21 Potential to Achieve Goals: Good Progress towards PT goals: Progressing toward goals  Frequency    Min 3X/week      PT Plan Current plan remains appropriate    Co-evaluation              AM-PAC PT "6 Clicks" Mobility   Outcome Measure  Help needed turning from your back to your side while in a flat bed without using bedrails?: A Little Help needed moving from lying on your back to sitting on the side of a flat bed without using bedrails?: A Little Help needed moving to and from a bed to a chair (including a wheelchair)?: A Little Help needed standing up from a chair using your arms (e.g., wheelchair or bedside chair)?: A Little Help needed to walk in hospital room?: A Little Help needed climbing 3-5  steps with a railing? : A Lot 6 Click Score: 17    End of Session Equipment Utilized During Treatment: Gait belt Activity Tolerance: Patient tolerated treatment well Patient left: in chair;with call bell/phone within reach;with family/visitor present Nurse Communication: Mobility status PT Visit Diagnosis: Unsteadiness on feet (R26.81);Difficulty in walking, not elsewhere classified (R26.2);Repeated falls (R29.6)     Time: 1219-7588 PT Time Calculation (min) (ACUTE ONLY): 17 min  Charges:  $Gait Training: 8-22 mins                     Ralene Bathe Kistler PT 05/28/2021  Acute Rehabilitation Services Pager [phone] Office [phone]

## 2021-05-28 NOTE — Care Management Important Message (Signed)
Important Message  Patient Details IM Letter placed in Patients room for legal guardian Wadie Lessen. Name: Teresa Riley MRN: [id number] Date of Birth: 05/07/1935   Medicare Important Message Given:  Yes     Caren Macadam 05/28/2021, 10:06 AM

## 2021-05-28 NOTE — Progress Notes (Signed)
PROGRESS NOTE    Teresa Riley  [id number] DOB: 05-26-1935 DOA: 05/25/2021 PCP: Biagio Borg, MD    Chief Complaint  Patient presents with   Respiratory Distress   Fall   Weakness    Brief Narrative:    Patient is a 86 year old female history of hypertension, dementia, CKD, hyperlipidemia presented to the ED for multiple falls, generalized weakness.  Patient noted to have had a runny nose, cough, some wheezing for 3 days prior to admission.  Patient noted to have stumbled and lost her footing and had a fall prior to admission, did not have any syncopal episode or any head injury.  Patient daughter checked on patient the morning of admission and patient found on the floor with some labored breathing.  Patient seen in the ED noted to be hypoxic on room air, placed on O2.  COVID-19 PCR obtained was positive.  Patient started on IV remdesivir and IV steroids.   Assessment & Plan:   Principal Problem:   Acute respiratory failure due to COVID-19 [facility]) Active Problems:   HTN (hypertension)   Hyperlipidemia   Dementia (HCC)   General weakness   CKD (chronic kidney disease) stage 3, GFR 30-59 ml/min (HCC)   COVID-19   Acute hypoxic respiratory failure secondary to acute COVID-19 infection COVID-19 PCR is positive, influenza PCR is negative.  Patient initially required oxygen and currently weaned off oxygen. Continue with IV remdesivir to complete the course.  Patient was started on IV Solu-Medrol 60 mg twice daily, transition to 60 mg daily.  Bronchodilators as needed.  Patient is alert and answering questions appropriately. Crp Levels have normalized.   Hypertension Blood pressure parameters are well controlled.     Dementia no behavioral abnormalities.  Continue with Namenda and Aricept.    Stage IIIa CKD Creatinine appears to be at base Baseline creatinine ranging from 1-1.3. Creatinine improving.   Hyperlipidemia:  Resume statin.     Generalized weakness  probably secondary to COVID infection Therapy evaluations recommending home health PT and OT   DVT prophylaxis: (Lovenox/) Code Status: full code.  Family Communication: discussed with family at bedside.  Disposition:   Status is: Inpatient  Remains inpatient appropriate because: IV REMDESEVIR,        Consultants:  NONE.   Procedures: NONE.   Antimicrobials: Antibiotics Given (last 72 hours)     Date/Time Action Medication Dose Rate   05/26/21 1025 New Bag/Given   remdesivir 100 mg in sodium chloride 0.9 % 100 mL IVPB 100 mg 200 mL/hr   05/27/21 0920 New Bag/Given   remdesivir 100 mg in sodium chloride 0.9 % 100 mL IVPB 100 mg 200 mL/hr   05/28/21 1006 New Bag/Given   remdesivir 100 mg in sodium chloride 0.9 % 100 mL IVPB 100 mg 200 mL/hr        Subjective: No chest pain or sob, no nausea, vomiting.   Objective: Vitals:   05/28/21 0527 05/28/21 0956 05/28/21 1127 05/28/21 1535  BP: (!) 150/75 132/72 (!) 154/79   Pulse: 65 70 70   Resp: 19     Temp: 98.4 F (36.9 C)  98.1 F (36.7 C)   TempSrc:   Oral   SpO2: 94%  92% 97%  Weight:      Height:        Intake/Output Summary (Last 24 hours) at 05/28/2021 1634 Last data filed at 05/28/2021 1430 Gross per 24 hour  Intake 350 ml  Output --  Net 350 ml  Filed Weights   05/25/21 1023  Weight: 90 kg    Examination:  General exam: Appears calm and comfortable  Respiratory system: Clear to auscultation. Respiratory effort normal. Cardiovascular system: S1 & S2 heard, RRR. No JVD,  No pedal edema. Gastrointestinal system: Abdomen is nondistended, soft and nontender. Normal bowel sounds heard. Central nervous system: Alert and oriented. No focal neurological deficits. Extremities: Symmetric 5 x 5 power. Skin: No rashes, lesions or ulcers Psychiatry:  Mood & affect appropriate.      Data Reviewed: I have personally reviewed following labs and imaging studies  CBC: Recent Labs  Lab  05/25/21 1017 05/25/21 1805 05/26/21 0542 05/27/21 0508 05/28/21 0509  WBC 10.3 6.4 6.8 9.7 8.9  NEUTROABS 7.2  --  5.4 8.2* 6.5  HGB 12.8 12.1 12.4 11.7* 11.8*  HCT 38.5 37.2 37.5 36.2 36.5  MCV 92.8 93.7 94.0 94.3 93.8  PLT 264 273 228 246 243     Basic Metabolic Panel: Recent Labs  Lab 05/25/21 1017 05/25/21 1805 05/26/21 0542 05/27/21 0508 05/28/21 0509  NA 135  --  136 138 138  K 3.7  --  3.6 3.9 3.7  CL 100  --  105 105 107  CO2 23  --  22 25 26   GLUCOSE 145*  --  199* 197* 134*  BUN 23  --  35* 36* 35*  CREATININE 1.17* 1.13* 1.15* 1.22* 1.05*  CALCIUM 9.4  --  8.9 8.9 8.6*  MG  --   --   --  2.4  --      GFR: Estimated Creatinine Clearance: 42.4 mL/min (A) (by C-G formula based on SCr of 1.05 mg/dL (H)).  Liver Function Tests: Recent Labs  Lab 05/25/21 1017 05/26/21 0542 05/27/21 0508 05/28/21 0509  AST 61* 67* 57* 41  ALT 21 24 24 25   ALKPHOS 120 103 96 83  BILITOT 0.9 0.6 0.6 0.4  PROT 8.7* 7.6 7.0 6.7  ALBUMIN 4.1 3.4* 3.2* 3.0*     CBG: Recent Labs  Lab 05/27/21 1701  GLUCAP 196*     Recent Results (from the past 240 hour(s))  Resp Panel by RT-PCR (Flu A&B, Covid) Nasopharyngeal Swab     Status: Abnormal   Collection Time: 05/25/21 11:34 AM   Specimen: Nasopharyngeal Swab; Nasopharyngeal(NP) swabs in vial transport medium  Result Value Ref Range Status   SARS Coronavirus 2 by RT PCR POSITIVE (A) NEGATIVE Final    Comment: RESULT CALLED TO, READ BACK BY AND VERIFIED WITH: Lynford Humphrey RN ON 05/25/2021 @1319  BY MECIAL J. (NOTE) SARS-CoV-2 target nucleic acids are DETECTED.  The SARS-CoV-2 RNA is generally detectable in upper respiratory specimens during the acute phase of infection. Positive results are indicative of the presence of the identified virus, but do not rule out bacterial infection or co-infection with other pathogens not detected by the test. Clinical correlation with patient history and other diagnostic information  is necessary to determine patient infection status. The expected result is Negative.  Fact Sheet for Patients: EntrepreneurPulse.com.au  Fact Sheet for Healthcare Providers: IncredibleEmployment.be  This test is not yet approved or cleared by the [place] FDA and  has been authorized for detection and/or diagnosis of SARS-CoV-2 by FDA under an Emergency Use Authorization (EUA).  This EUA will remain in effect (meaning t his test can be used) for the duration of  the COVID-19 declaration under Section 564(b)(1) of the Act, 21 U.S.C. section 360bbb-3(b)(1), unless the authorization is terminated or revoked sooner.  Influenza A by PCR NEGATIVE NEGATIVE Final   Influenza B by PCR NEGATIVE NEGATIVE Final    Comment: (NOTE) The Xpert Xpress SARS-CoV-2/FLU/RSV plus assay is intended as an aid in the diagnosis of influenza from Nasopharyngeal swab specimens and should not be used as a sole basis for treatment. Nasal washings and aspirates are unacceptable for Xpert Xpress SARS-CoV-2/FLU/RSV testing.  Fact Sheet for Patients: EntrepreneurPulse.com.au  Fact Sheet for Healthcare Providers: IncredibleEmployment.be  This test is not yet approved or cleared by the [place] FDA and has been authorized for detection and/or diagnosis of SARS-CoV-2 by FDA under an Emergency Use Authorization (EUA). This EUA will remain in effect (meaning this test can be used) for the duration of the COVID-19 declaration under Section 564(b)(1) of the Act, 21 U.S.C. section 360bbb-3(b)(1), unless the authorization is terminated or revoked.  Performed at [facility], [place] [address] [place], [place] [address]   Urine Culture     Status: None   Collection Time: 05/25/21 11:54 AM   Specimen: In/Out Cath Urine  Result Value Ref Range Status   Specimen Description   Final    IN/OUT CATH URINE Performed at  [place] [address], [place], [place] [address]    Special Requests   Final    NONE Performed at [facility], [place] [address] [place], [place] [address]    Culture   Final    NO GROWTH Performed at [facility], [place] [address] [place], [place] [address]    Report Status 05/26/2021 FINAL  Final  Blood Culture (routine x 2)     Status: None (Preliminary result)   Collection Time: 05/25/21  1:58 PM   Specimen: BLOOD  Result Value Ref Range Status   Specimen Description   Final    BLOOD LEFT ANTECUBITAL Performed at [place] [address] [place], [place] [address]    Special Requests   Final    BOTTLES DRAWN AEROBIC AND ANAEROBIC Blood Culture adequate volume Performed at [place] [address], [place], [place] [address]    Culture   Final    NO GROWTH 3 DAYS Performed at [facility], [place] [address] [place], [place] [address]    Report Status PENDING  Incomplete  Blood Culture (routine x 2)     Status: None (Preliminary result)   Collection Time: 05/25/21  2:03 PM   Specimen: BLOOD  Result Value Ref Range Status   Specimen Description   Final    BLOOD BLOOD RIGHT HAND Performed at [facility] [address] [place], [place] [address]    Special Requests   Final    BOTTLES DRAWN AEROBIC AND ANAEROBIC Blood Culture adequate volume Performed at [facility] [address] [place], [place] [address]    Culture   Final    NO GROWTH 3 DAYS Performed at [facility], [place] [address], [place], [place] [address]    Report Status PENDING  Incomplete          Radiology Studies: No results found.      Scheduled Meds:  amLODipine  10 mg Oral QHS   vitamin C  500 mg Oral Daily   atorvastatin  20 mg Oral Daily   citalopram  10 mg Oral q morning   memantine  28 mg Oral QHS   And   donepezil  10 mg Oral QHS  enoxaparin (LOVENOX) injection  40 mg Subcutaneous Q24H   Ipratropium-Albuterol  1 puff Inhalation TID   labetalol  300 mg Oral BID   lisinopril  40 mg Oral QHS   methylPREDNISolone (SOLU-MEDROL) injection  60 mg Intravenous Q24H   mometasone-formoterol  2 puff Inhalation BID   pantoprazole  40 mg Oral Q0600   QUEtiapine  50 mg Oral QHS   zinc sulfate  220 mg Oral Daily   Continuous Infusions:  remdesivir 100 mg in NS 100 mL Stopped (05/28/21 1036)     LOS: 3 days        Kathlen Mody, MD [facility]   To contact the attending provider between 7A-7P or the covering provider during after hours 7P-7A, please log into the web site [email or web address] and access using universal [place] password for that web site. If you do not have the password, please call the hospital operator.  05/28/2021, 4:34 PM

## 2021-05-29 LAB — CBC WITH DIFFERENTIAL/PLATELET
Abs Immature Granulocytes: 0.12 10*3/uL — ABNORMAL HIGH (ref 0.00–0.07)
Basophils Absolute: 0 10*3/uL (ref 0.0–0.1)
Basophils Relative: 0 %
Eosinophils Absolute: 0 10*3/uL (ref 0.0–0.5)
Eosinophils Relative: 0 %
HCT: 38.6 % (ref 36.0–46.0)
Hemoglobin: 12.5 g/dL (ref 12.0–15.0)
Immature Granulocytes: 2 %
Lymphocytes Relative: 25 %
Lymphs Abs: 2 10*3/uL (ref 0.7–4.0)
MCH: 30.7 pg (ref 26.0–34.0)
MCHC: 32.4 g/dL (ref 30.0–36.0)
MCV: 94.8 fL (ref 80.0–100.0)
Monocytes Absolute: 0.6 10*3/uL (ref 0.1–1.0)
Monocytes Relative: 8 %
Neutro Abs: 5.2 10*3/uL (ref 1.7–7.7)
Neutrophils Relative %: 65 %
Platelets: 257 10*3/uL (ref 150–400)
RBC: 4.07 MIL/uL (ref 3.87–5.11)
RDW: 13.5 % (ref 11.5–15.5)
WBC: 7.9 10*3/uL (ref 4.0–10.5)
nRBC: 0 % (ref 0.0–0.2)

## 2021-05-29 LAB — C-REACTIVE PROTEIN: CRP: 0.5 mg/dL (ref <1.0)

## 2021-05-29 LAB — COMPREHENSIVE METABOLIC PANEL
ALT: 36 U/L (ref 0–44)
AST: 40 U/L (ref 15–41)
Albumin: 3.2 g/dL — ABNORMAL LOW (ref 3.5–5.0)
Alkaline Phosphatase: 87 U/L (ref 38–126)
Anion gap: 9 (ref 5–15)
BUN: 20 mg/dL (ref 8–23)
CO2: 27 mmol/L (ref 22–32)
Calcium: 8.7 mg/dL — ABNORMAL LOW (ref 8.9–10.3)
Chloride: 104 mmol/L (ref 98–111)
Creatinine, Ser: 0.89 mg/dL (ref 0.44–1.00)
GFR, Estimated: >60 mL/min (ref >60)
Glucose, Bld: 131 mg/dL — ABNORMAL HIGH (ref 70–99)
Potassium: 3.3 mmol/L — ABNORMAL LOW (ref 3.5–5.1)
Sodium: 140 mmol/L (ref 135–145)
Total Bilirubin: 0.6 mg/dL (ref 0.3–1.2)
Total Protein: 7 g/dL (ref 6.5–8.1)

## 2021-05-29 LAB — D-DIMER, QUANTITATIVE: D-Dimer, Quant: 1.6 ug/mL-FEU — ABNORMAL HIGH (ref 0.00–0.50)

## 2021-05-29 LAB — FERRITIN: Ferritin: 169 ng/mL (ref 11–307)

## 2021-05-29 MED ORDER — GUAIFENESIN-DM 100-10 MG/5ML PO SYRP: 10.0000 mL | ORAL_SOLUTION | ORAL | 0 refills | Status: DC | PRN

## 2021-05-29 MED ORDER — ALUM & MAG HYDROXIDE-SIMETH 200-200-20 MG/5ML PO SUSP: 30.0000 mL | Freq: Four times a day (QID) | ORAL | Status: DC | PRN

## 2021-05-29 MED ORDER — PANTOPRAZOLE SODIUM 40 MG PO TBEC: 40.0000 mg | DELAYED_RELEASE_TABLET | Freq: Every day | ORAL | 0 refills | Status: DC

## 2021-05-29 MED ORDER — DEXAMETHASONE 6 MG PO TABS: 6.0000 mg | ORAL_TABLET | Freq: Every day | ORAL | 0 refills | Status: AC

## 2021-05-29 MED ORDER — POTASSIUM CHLORIDE CRYS ER 20 MEQ PO TBCR
40.0000 meq | EXTENDED_RELEASE_TABLET | Freq: Once | ORAL | Status: AC
  Administered 2021-05-29: 40 meq via ORAL
  Filled 2021-05-29: qty 2

## 2021-05-29 MED ORDER — IPRATROPIUM-ALBUTEROL 20-100 MCG/ACT IN AERS: 1.0000 | INHALATION_SPRAY | Freq: Three times a day (TID) | RESPIRATORY_TRACT | 1 refills | Status: DC

## 2021-05-29 MED ORDER — ALUM & MAG HYDROXIDE-SIMETH 200-200-20 MG/5ML PO SUSP: 30.0000 mL | Freq: Four times a day (QID) | ORAL | 0 refills | Status: DC | PRN

## 2021-05-29 NOTE — Discharge Summary (Signed)
Physician Discharge Summary  Teresa Riley [id number] DOB: December 14, 1934 DOA: 05/25/2021  PCP: Biagio Borg, MD  Admit date: 05/25/2021 Discharge date: 05/29/2021  Admitted From: Home.  Disposition:  Home.   Recommendations for Outpatient Follow-up:  Follow up with PCP in 1-2 weeks Please obtain BMP/CBC in one week   Discharge Condition: stable.  CODE STATUS:full code.  Diet recommendation: Heart Healthy   Brief/Interim Summary: Patient is a 86 year old female history of hypertension, dementia, CKD, hyperlipidemia presented to the ED for multiple falls, generalized weakness.  Patient noted to have had a runny nose, cough, some wheezing for 3 days prior to admission.  Patient noted to have stumbled and lost her footing and had a fall prior to admission, did not have any syncopal episode or any head injury.  Patient daughter checked on patient the morning of admission and patient found on the floor with some labored breathing.  Patient seen in the ED noted to be hypoxic on room air, placed on O2.  COVID-19 PCR obtained was positive.  Patient started on IV remdesivir and IV steroids.  Discharge Diagnoses:  Principal Problem:   Acute respiratory failure due to COVID-19 [facility]) Active Problems:   HTN (hypertension)   Hyperlipidemia   Dementia (HCC)   General weakness   CKD (chronic kidney disease) stage 3, GFR 30-59 ml/min (HCC)   COVID-19  Acute hypoxic respiratory failure secondary to acute COVID-19 infection COVID-19 PCR is positive, influenza PCR is negative.  Patient initially required oxygen and currently weaned off oxygen. Completed  IV remdesivir course.  Patient was started on IV Solu-Medrol 60 mg twice daily, transitioned to decadon on discharge. Bronchodilators as needed.  Patient is alert and answering questions appropriately. Crp Levels have normalized.    Hypertension Blood pressure parameters are well controlled.        Dementia no behavioral abnormalities.   Continue with Namenda and Aricept.       Stage IIIa CKD Creatinine appears to be at base Baseline creatinine ranging from 1-1.3. Creatinine  much improved.    Hyperlipidemia:  Resume statin.       Generalized weakness probably secondary to COVID infection Therapy evaluations recommending home health PT and OT    Discharge Instructions  Discharge Instructions     Diet - low sodium heart healthy   Complete by: As directed    Increase activity slowly   Complete by: As directed       Allergies as of 05/29/2021   No Known Allergies      Medication List     STOP taking these medications    guaiFENesin 100 MG/5ML liquid Commonly known as: ROBITUSSIN       TAKE these medications    acetaminophen 500 MG tablet Commonly known as: TYLENOL Take 1,000 mg by mouth every 6 (six) hours as needed for headache or fever (pain).   albuterol 108 (90 Base) MCG/ACT inhaler Commonly known as: VENTOLIN HFA Inhale 2 puffs into the lungs every 6 (six) hours as needed for wheezing or shortness of breath.   albuterol (2.5 MG/3ML) 0.083% nebulizer solution Commonly known as: PROVENTIL Take 3 mLs (2.5 mg total) by nebulization every 6 (six) hours as needed for wheezing or shortness of breath.   ALPRAZolam 0.25 MG tablet Commonly known as: XANAX TAKE 1 TABLET(0.25 MG) BY MOUTH TWICE DAILY AS NEEDED What changed:  how much to take how to take this when to take this additional instructions   alum & mag hydroxide-simeth 200-200-20 MG/5ML suspension  Commonly known as: MAALOX/MYLANTA Take 30 mLs by mouth every 6 (six) hours as needed for indigestion or heartburn.   amLODipine 10 MG tablet Commonly known as: NORVASC TAKE 1 TABLET(10 MG) BY MOUTH DAILY What changed:  how much to take how to take this when to take this additional instructions   atorvastatin 20 MG tablet Commonly known as: Lipitor Take 1 tablet (20 mg total) by mouth daily.   citalopram 10 MG  tablet Commonly known as: CELEXA TAKE 1 TABLET(10 MG) BY MOUTH DAILY What changed:  how much to take how to take this when to take this additional instructions   dexamethasone 6 MG tablet Commonly known as: DECADRON Take 1 tablet (6 mg total) by mouth daily for 5 days. Start taking on: May 30, 2021   guaiFENesin-dextromethorphan 100-10 MG/5ML syrup Commonly known as: ROBITUSSIN DM Take 10 mLs by mouth every 4 (four) hours as needed for cough.   Ipratropium-Albuterol 20-100 MCG/ACT Aers respimat Commonly known as: COMBIVENT Inhale 1 puff into the lungs 3 (three) times daily.   labetalol 300 MG tablet Commonly known as: NORMODYNE TAKE 1 TABLET(300 MG) BY MOUTH TWICE DAILY What changed:  how much to take how to take this when to take this additional instructions   lisinopril 40 MG tablet Commonly known as: ZESTRIL Take 1 tablet (40 mg total) by mouth daily. What changed: when to take this   Namzaric 28-10 MG Cp24 Generic drug: Memantine HCl-Donepezil HCl Take 1 capsule by mouth every morning.   pantoprazole 40 MG tablet Commonly known as: PROTONIX Take 1 tablet (40 mg total) by mouth daily at 6 (six) AM. Start taking on: May 30, 2021   Polyethyl Glycol-Propyl Glycol 0.4-0.3 % Soln Place 1 drop into both eyes daily as needed (dry eyes).   QUEtiapine 50 MG tablet Commonly known as: SEROQUEL TAKE 1 TABLET(50 MG) BY MOUTH AT BEDTIME What changed:  how much to take how to take this when to take this additional instructions        Follow-up Information     [facility] Follow up.   Specialty: Home Health Services Why: They will contact you about setting up a time to come out for physical therapy Contact information: [address] [place] [address] [phone]         Biagio Borg, MD. Schedule an appointment as soon as possible for a visit in 1 week(s).   Specialties: Internal Medicine, Radiology Contact  information: [place] [address] [phone]                No Known Allergies  Consultations: None.    Procedures/Studies: CT Head Wo Contrast  Result Date: 05/25/2021 CLINICAL DATA:  Head trauma, moderate to severe.  Fell from bed. EXAM: CT HEAD WITHOUT CONTRAST TECHNIQUE: Contiguous axial images were obtained from the base of the skull through the vertex without intravenous contrast. COMPARISON:  MRI 11/18/2016.  CT 05/08/2014. FINDINGS: Brain: No CT evidence of acute infarction. There is chronic brain atrophy with extensive chronic small-vessel ischemic changes throughout the hemispheric white matter. There is ventriculomegaly, similar to the MRI of 2018 but progressive when compared to the CT of 2015. Whereas ventriculomegaly may relate to central atrophy, normal pressure hydrocephalus is not excluded on the basis of the imaging. No sign of mass, hemorrhage or extra-axial collection. Vascular: There is atherosclerotic calcification of the major vessels at the base of the brain. Skull: Negative Sinuses/Orbits: Mild mucosal thickening  of the maxillary sinuses. Tiny amount of fluid in the sphenoid sinus. Orbits negative. Other: None IMPRESSION: No acute CT finding. Generalized atrophy. Advanced chronic small-vessel ischemic changes of the white matter. Chronic ventriculomegaly, similar to the MRI of 2018 but advanced compared to the CT of 2015. Findings may be due to central atrophy, but chronic normal pressure hydrocephalus is not excluded by the imaging. Electronically Signed   By: Nelson Chimes M.D.   On: 05/25/2021 11:41   CT Angio Chest Pulmonary Embolism (PE) W or WO Contrast  Result Date: 05/25/2021 CLINICAL DATA:  Shortness of breath, COVID positive. EXAM: CT ANGIOGRAPHY CHEST WITH CONTRAST TECHNIQUE: Multidetector CT imaging of the chest was performed using the standard protocol during bolus administration of intravenous contrast. Multiplanar CT image  reconstructions and MIPs were obtained to evaluate the vascular anatomy. CONTRAST:  62mL OMNIPAQUE IOHEXOL 350 MG/ML SOLN COMPARISON:  None. FINDINGS: Cardiovascular: No filling defects in the pulmonary arteries to suggest pulmonary emboli. Cardio may. Coronary artery and aortic calcifications. No aneurysm. Mediastinum/Nodes: No mediastinal, hilar, or axillary adenopathy. Trachea and esophagus are unremarkable. Thyroid unremarkable. Moderate-sized hiatal hernia. Lungs/Pleura: Dependent bibasilar atelectasis. No effusions or other confluent airspace opacity. Upper Abdomen: Imaging into the upper abdomen demonstrates no acute findings. Musculoskeletal: Chest wall soft tissues are unremarkable. No acute bony abnormality. Review of the MIP images confirms the above findings. IMPRESSION: No evidence of pulmonary embolus. Cardiomegaly.  Coronary artery disease. Dependent bibasilar atelectasis. Moderate-sized hiatal hernia. Aortic Atherosclerosis (ICD10-I70.0). Electronically Signed   By: Rolm Baptise M.D.   On: 05/25/2021 22:03   CT Cervical Spine Wo Contrast  Result Date: 05/25/2021 CLINICAL DATA:  [place] from the bed.  Trauma to the head and neck. EXAM: CT CERVICAL SPINE WITHOUT CONTRAST TECHNIQUE: Multidetector CT imaging of the cervical spine was performed without intravenous contrast. Multiplanar CT image reconstructions were also generated. COMPARISON:  None. FINDINGS: Alignment: Mild scoliotic curvature.  No traumatic malalignment. Skull base and vertebrae: No fracture or focal bone lesion. Soft tissues and spinal canal: No significant soft tissue neck finding. Disc levels: Ordinary osteoarthritis at the C1-2 articulation. No encroachment upon the neural structures. Degenerative spondylosis at C5-6 and C6-7 but without apparent compressive bony narrowing of the canal or foramina. Upper chest: Negative Other: None IMPRESSION: No acute or traumatic finding. Ordinary chronic degenerative spondylosis at C5-6 and  C6-7. Electronically Signed   By: Nelson Chimes M.D.   On: 05/25/2021 11:43   DG Pelvis Portable  Result Date: 05/25/2021 CLINICAL DATA:  Fall. Weakness and shortness of breath over the last 3 days. EXAM: PORTABLE PELVIS 1-2 VIEWS COMPARISON:  None. FINDINGS: There is no evidence of pelvic fracture or diastasis. No pelvic bone lesions are seen. IMPRESSION: Negative. Electronically Signed   By: Nolon Nations M.D.   On: 05/25/2021 11:01   DG Chest Portable 1 View  Result Date: 05/25/2021 CLINICAL DATA:  Shortness of breath.  Increased weakness. EXAM: PORTABLE CHEST 1 VIEW COMPARISON:  10/17/2019 and chest CT 11/06/2014 FINDINGS: Again noted is elevation of the right hemidiaphragm. Increased right basilar chest densities. Otherwise, the lungs are clear. Retrocardiac opacity could be related to thoracic aorta or a hiatal hernia. Heart size is within normal limits and stable. Atherosclerotic calcifications at the aortic arch. IMPRESSION: Increased right basilar densities are most compatible with atelectasis. Otherwise, no acute chest findings. Electronically Signed   By: Markus Daft M.D.   On: 05/25/2021 10:49      Subjective:  No new complaints.  Discharge Exam:  Vitals:   05/28/21 2123 05/29/21 0457  BP: (!) 166/84 (!) 163/80  Pulse: 67 67  Resp: 17 19  Temp: 98 F (36.7 C) 98.1 F (36.7 C)  SpO2: 96% 93%   Vitals:   05/28/21 1127 05/28/21 1535 05/28/21 2123 05/29/21 0457  BP: (!) 154/79  (!) 166/84 (!) 163/80  Pulse: 70  67 67  Resp:   17 19  Temp: 98.1 F (36.7 C)  98 F (36.7 C) 98.1 F (36.7 C)  TempSrc: Oral  Oral Oral  SpO2: 92% 97% 96% 93%  Weight:      Height:        General: Pt is alert, awake, not in acute distress Cardiovascular: RRR, S1/S2 +, no rubs, no gallops Respiratory: CTA bilaterally, no wheezing, no rhonchi Abdominal: Soft, NT, ND, bowel sounds + Extremities: no edema, no cyanosis    The results of significant diagnostics from this hospitalization  (including imaging, microbiology, ancillary and laboratory) are listed below for reference.     Microbiology: Recent Results (from the past 240 hour(s))  Resp Panel by RT-PCR (Flu A&B, Covid) Nasopharyngeal Swab     Status: Abnormal   Collection Time: 05/25/21 11:34 AM   Specimen: Nasopharyngeal Swab; Nasopharyngeal(NP) swabs in vial transport medium  Result Value Ref Range Status   SARS Coronavirus 2 by RT PCR POSITIVE (A) NEGATIVE Final    Comment: RESULT CALLED TO, READ BACK BY AND VERIFIED WITH: Lind Covert RN ON 05/25/2021 @1319  BY MECIAL J. (NOTE) SARS-CoV-2 target nucleic acids are DETECTED.  The SARS-CoV-2 RNA is generally detectable in upper respiratory specimens during the acute phase of infection. Positive results are indicative of the presence of the identified virus, but do not rule out bacterial infection or co-infection with other pathogens not detected by the test. Clinical correlation with patient history and other diagnostic information is necessary to determine patient infection status. The expected result is Negative.  Fact Sheet for Patients:  Fact Sheet for Healthcare Providers: BloggerCourse.com  This test is not yet approved or cleared by the [place] FDA and  has been authorized for detection and/or diagnosis of SARS-CoV-2 by FDA under an Emergency Use Authorization (EUA).  This EUA will remain in effect (meaning t his test can be used) for the duration of  the COVID-19 declaration under Section 564(b)(1) of the Act, 21 U.S.C. section 360bbb-3(b)(1), unless the authorization is terminated or revoked sooner.     Influenza A by PCR NEGATIVE NEGATIVE Final   Influenza B by PCR NEGATIVE NEGATIVE Final    Comment: (NOTE) The Xpert Xpress SARS-CoV-2/FLU/RSV plus assay is intended as an aid in the diagnosis of influenza from Nasopharyngeal swab specimens and should not be used as a  sole basis for treatment. Nasal washings and aspirates are unacceptable for Xpert Xpress SARS-CoV-2/FLU/RSV testing.  Fact Sheet for Patients: [place]  Fact Sheet for Healthcare Providers: BloggerCourse.com  This test is not yet approved or cleared by the [place] FDA and has been authorized for detection and/or diagnosis of SARS-CoV-2 by FDA under an Emergency Use Authorization (EUA). This EUA will remain in effect (meaning this test can be used) for the duration of the COVID-19 declaration under Section 564(b)(1) of the Act, 21 U.S.C. section 360bbb-3(b)(1), unless the authorization is terminated or revoked.  Performed at [facility], [address] [place], [place]   Urine Culture     Status: None   Collection Time: 05/25/21 11:54 AM   Specimen: In/Out Cath  Urine  Result Value Ref Range Status   Specimen Description   Final    IN/OUT CATH URINE Performed at [facility], [place] [address] [place], [place] [address]    Special Requests   Final    NONE Performed at [name], [place] [address] [place], [place] [address]    Culture   Final    NO GROWTH Performed at [facility], [place] [address], [place], [place] [address]    Report Status 05/26/2021 FINAL  Final  Blood Culture (routine x 2)     Status: None (Preliminary result)   Collection Time: 05/25/21  1:58 PM   Specimen: BLOOD  Result Value Ref Range Status   Specimen Description   Final    BLOOD LEFT ANTECUBITAL Performed at [place] [address] [place], [place] [address]    Special Requests   Final    BOTTLES DRAWN AEROBIC AND ANAEROBIC Blood Culture adequate volume Performed at [place] [address], [place], [place] [address]    Culture   Final    NO GROWTH 4 DAYS Performed at [facility], [place] [address],  [place], [place] [address]    Report Status PENDING  Incomplete  Blood Culture (routine x 2)     Status: None (Preliminary result)   Collection Time: 05/25/21  2:03 PM   Specimen: BLOOD  Result Value Ref Range Status   Specimen Description   Final    BLOOD BLOOD RIGHT HAND Performed at [place] [address], [place], [place] [address]    Special Requests   Final    BOTTLES DRAWN AEROBIC AND ANAEROBIC Blood Culture adequate volume Performed at [place] [address], [place], [place] [address]    Culture   Final    NO GROWTH 4 DAYS Performed at [facility], [place] [address] [place], [place] [address]    Report Status PENDING  Incomplete     Labs: BNP (last 3 results) Recent Labs    05/25/21 1017  BNP [id number]*   Basic Metabolic Panel: Recent Labs  Lab 05/25/21 1017 05/25/21 1805 05/26/21 0542 05/27/21 0508 05/28/21 0509 05/29/21 0453  NA 135  --  136 138 138 140  K 3.7  --  3.6 3.9 3.7 3.3*  CL 100  --  105 105 107 104  CO2 23  --  22 25 26 27   GLUCOSE 145*  --  199* 197* 134* 131*  BUN 23  --  35* 36* 35* 20  CREATININE 1.17* 1.13* 1.15* 1.22* 1.05* 0.89  CALCIUM 9.4  --  8.9 8.9 8.6* 8.7*  MG  --   --   --  2.4  --   --    Liver Function Tests: Recent Labs  Lab 05/25/21 1017 05/26/21 0542 05/27/21 0508 05/28/21 0509 05/29/21 0453  AST 61* 67* 57* 41 40  ALT 21 24 24 25  36  ALKPHOS 120 103 96 83 87  BILITOT 0.9 0.6 0.6 0.4 0.6  PROT 8.7* 7.6 7.0 6.7 7.0  ALBUMIN 4.1 3.4* 3.2* 3.0* 3.2*   No results for input(s): LIPASE, AMYLASE in the last 168 hours. No results for input(s): AMMONIA in the last 168 hours. CBC: Recent Labs  Lab 05/25/21 1017 05/25/21 1805 05/26/21 0542 05/27/21 0508 05/28/21 0509 05/29/21 0453  WBC 10.3 6.4 6.8 9.7 8.9 7.9  NEUTROABS 7.2  --  5.4 8.2* 6.5  5.2  HGB 12.8 12.1 12.4 11.7* 11.8* 12.5  HCT 38.5 37.2 37.5 36.2 36.5 38.6  MCV 92.8 93.7 94.0 94.3 93.8 94.8  PLT 264  273 228 246 243 257   Cardiac Enzymes: No results for input(s): CKTOTAL, CKMB, CKMBINDEX, TROPONINI in the last 168 hours. BNP: Invalid input(s): POCBNP CBG: Recent Labs  Lab 05/27/21 1701  GLUCAP 196*   D-Dimer Recent Labs    05/28/21 0509 05/29/21 0453  DDIMER 1.67* 1.60*   Hgb A1c No results for input(s): HGBA1C in the last 72 hours. Lipid Profile No results for input(s): CHOL, HDL, LDLCALC, TRIG, CHOLHDL, LDLDIRECT in the last 72 hours. Thyroid function studies No results for input(s): TSH, T4TOTAL, T3FREE, THYROIDAB in the last 72 hours.  Invalid input(s): FREET3 Anemia work up Recent Labs    05/28/21 0509 05/29/21 0453  FERRITIN 107 169   Urinalysis    Component Value Date/Time   COLORURINE YELLOW 05/25/2021 [place] 05/25/2021 1154   LABSPEC 1.025 05/25/2021 1154   PHURINE 5.0 05/25/2021 1154   GLUCOSEU NEGATIVE 05/25/2021 1154   GLUCOSEU NEGATIVE 03/23/2021 1435   HGBUR NEGATIVE 05/25/2021 1154   BILIRUBINUR NEGATIVE 05/25/2021 1154   KETONESUR 5 (A) 05/25/2021 1154   PROTEINUR 100 (A) 05/25/2021 1154   UROBILINOGEN 0.2 03/23/2021 1435   NITRITE NEGATIVE 05/25/2021 1154   LEUKOCYTESUR NEGATIVE 05/25/2021 1154   Sepsis Labs Invalid input(s): PROCALCITONIN,  WBC,  LACTICIDVEN Microbiology Recent Results (from the past 240 hour(s))  Resp Panel by RT-PCR (Flu A&B, Covid) Nasopharyngeal Swab     Status: Abnormal   Collection Time: 05/25/21 11:34 AM   Specimen: Nasopharyngeal Swab; Nasopharyngeal(NP) swabs in vial transport medium  Result Value Ref Range Status   SARS Coronavirus 2 by RT PCR POSITIVE (A) NEGATIVE Final    Comment: RESULT CALLED TO, READ BACK BY AND VERIFIED WITH: Lynford Humphrey RN ON 05/25/2021 @1319  BY MECIAL J. (NOTE) SARS-CoV-2 target nucleic acids are DETECTED.  The SARS-CoV-2 RNA is generally detectable in upper respiratory specimens during the acute phase of infection. Positive results are indicative of the  presence of the identified virus, but do not rule out bacterial infection or co-infection with other pathogens not detected by the test. Clinical correlation with patient history and other diagnostic information is necessary to determine patient infection status. The expected result is Negative.  Fact Sheet for Patients: EntrepreneurPulse.com.au  Fact Sheet for Healthcare Providers: IncredibleEmployment.be  This test is not yet approved or cleared by the [place] FDA and  has been authorized for detection and/or diagnosis of SARS-CoV-2 by FDA under an Emergency Use Authorization (EUA).  This EUA will remain in effect (meaning t his test can be used) for the duration of  the COVID-19 declaration under Section 564(b)(1) of the Act, 21 U.S.C. section 360bbb-3(b)(1), unless the authorization is terminated or revoked sooner.     Influenza A by PCR NEGATIVE NEGATIVE Final   Influenza B by PCR NEGATIVE NEGATIVE Final    Comment: (NOTE) The Xpert Xpress SARS-CoV-2/FLU/RSV plus assay is intended as an aid in the diagnosis of influenza from Nasopharyngeal swab specimens and should not be used as a sole basis for treatment. Nasal washings and aspirates are unacceptable for Xpert Xpress SARS-CoV-2/FLU/RSV testing.  Fact Sheet for Patients: EntrepreneurPulse.com.au  Fact Sheet for Healthcare Providers: IncredibleEmployment.be  This test is not yet approved or cleared by the [place] FDA and has been authorized for detection and/or diagnosis of SARS-CoV-2 by FDA under an Emergency Use  Authorization (EUA). This EUA will remain in effect (meaning this test can be used) for the duration of the COVID-19 declaration under Section 564(b)(1) of the Act, 21 U.S.C. section 360bbb-3(b)(1), unless the authorization is terminated or revoked.  Performed at [facility], [place] [address], [place], [place] [address]   Urine Culture     Status: None   Collection Time: 05/25/21 11:54 AM   Specimen: In/Out Cath Urine  Result Value Ref Range Status   Specimen Description   Final    IN/OUT CATH URINE Performed at [place] [address] [place], [place] [address]    Special Requests   Final    NONE Performed at [facility], [place] [address], [place], [place] [address]    Culture   Final    NO GROWTH Performed at [facility], [place] [address] [place], [place] [address]    Report Status 05/26/2021 FINAL  Final  Blood Culture (routine x 2)     Status: None (Preliminary result)   Collection Time: 05/25/21  1:58 PM   Specimen: BLOOD  Result Value Ref Range Status   Specimen Description   Final    BLOOD LEFT ANTECUBITAL Performed at [place] [address], [place], [place] [address]    Special Requests   Final    BOTTLES DRAWN AEROBIC AND ANAEROBIC Blood Culture adequate volume Performed at [place] [address] [place], [place] [address]    Culture   Final    NO GROWTH 4 DAYS Performed at [facility], [place] [address] [place], [place] [address]    Report Status PENDING  Incomplete  Blood Culture (routine x 2)     Status: None (Preliminary result)   Collection Time: 05/25/21  2:03 PM   Specimen: BLOOD  Result Value Ref Range Status   Specimen Description   Final    BLOOD BLOOD RIGHT HAND Performed at [place] [address] [place], [place] [address]    Special Requests   Final    BOTTLES DRAWN AEROBIC AND ANAEROBIC Blood Culture adequate volume Performed at [place] [address], [place], [place] [address]    Culture   Final    NO GROWTH 4 DAYS Performed at [facility], [place] [address] [place], [place] [address]    Report Status PENDING  Incomplete     Time coordinating discharge: 33 minutes.    SIGNED:   Hosie Poisson, MD  [facility] 05/29/2021, 1:19 PM

## 2021-05-30 LAB — CULTURE, BLOOD (ROUTINE X 2)
Culture: NO GROWTH
Culture: NO GROWTH
Special Requests: ADEQUATE
Special Requests: ADEQUATE

## 2021-06-08 ENCOUNTER — Telehealth: Payer: Self-pay

## 2021-06-08 NOTE — Telephone Encounter (Signed)
VOB submitted for Monovisc, right knee BV pending  

## 2021-06-17 ENCOUNTER — Telehealth: Payer: Self-pay

## 2021-06-17 NOTE — Telephone Encounter (Signed)
Approved for Monovisc, right knee. Buy & Bill Patient will be responsible for 20% OOP. Co-pay of $20.00 No PA required per [facility]  Appt. 06/22/2021

## 2021-06-17 NOTE — Telephone Encounter (Signed)
Called patient back to reschedule appointment due to gel injection needing to be done after 06/26/2021.

## 2021-06-22 ENCOUNTER — Ambulatory Visit: Payer: Medicare HMO | Admitting: Orthopedic Surgery

## 2021-06-29 ENCOUNTER — Ambulatory Visit: Payer: Medicare HMO | Admitting: Orthopedic Surgery

## 2021-07-01 ENCOUNTER — Ambulatory Visit: Payer: Medicare HMO | Admitting: Internal Medicine

## 2021-07-01 ENCOUNTER — Other Ambulatory Visit: Payer: Self-pay | Admitting: Internal Medicine

## 2021-07-01 NOTE — Telephone Encounter (Signed)
Please refill as per office routine med refill policy (all routine meds to be refilled for 3 mo or monthly (per pt preference) up to one year from last visit, then month to month grace period for 3 mo, then further med refills will have to be denied) ? ?

## 2021-07-07 ENCOUNTER — Other Ambulatory Visit: Payer: Self-pay

## 2021-07-07 ENCOUNTER — Ambulatory Visit (INDEPENDENT_AMBULATORY_CARE_PROVIDER_SITE_OTHER): Payer: Medicare HMO | Admitting: Internal Medicine

## 2021-07-07 ENCOUNTER — Encounter: Payer: Self-pay | Admitting: Internal Medicine

## 2021-07-07 DIAGNOSIS — E559 Vitamin D deficiency, unspecified: Secondary | ICD-10-CM

## 2021-07-07 DIAGNOSIS — U071 COVID-19: Secondary | ICD-10-CM

## 2021-07-07 DIAGNOSIS — J9600 Acute respiratory failure, unspecified whether with hypoxia or hypercapnia: Secondary | ICD-10-CM | POA: Diagnosis not present

## 2021-07-07 DIAGNOSIS — F0390 Unspecified dementia without behavioral disturbance: Secondary | ICD-10-CM | POA: Diagnosis not present

## 2021-07-07 DIAGNOSIS — I10 Essential (primary) hypertension: Secondary | ICD-10-CM | POA: Diagnosis not present

## 2021-07-07 MED ORDER — ATORVASTATIN CALCIUM 20 MG PO TABS: 20.0000 mg | ORAL_TABLET | Freq: Every day | ORAL | 3 refills | Status: DC

## 2021-07-07 MED ORDER — IPRATROPIUM-ALBUTEROL 20-100 MCG/ACT IN AERS: 1.0000 | INHALATION_SPRAY | Freq: Three times a day (TID) | RESPIRATORY_TRACT | 11 refills | Status: DC

## 2021-07-07 NOTE — Progress Notes (Signed)
Patient ID: Teresa Riley, female   DOB: 24-Jul-1934, 86 y.o.   MRN: [id number]        Chief Complaint: follow up hospn nov 14-18 covid +, generalized weakness       HPI:  Teresa Riley is a 86 y.o. female here with daughter, pt overall doing quite well at home given her comorbids, has very good family support, still has some post hospn fatigue and lower stamina but ow Pt denies chest pain, increased sob or doe, wheezing, orthopnea, PND, increased LE swelling, palpitations, dizziness or syncope. In fact , the combivent has helped very much post hospn over albuterol hfa prn,  Pt denies polydipsia, polyuria, or  new focal neuro s/s.  Dementia overall stable symptomatically, and not assoc with behavioral changes such as hallucinations, paranoia, or agitation.  No noted siezure activity.  Not taking Vit D.  Denies worsening depressive symptoms, suicidal ideation, or panic.         Wt Readings from Last 3 Encounters:  07/07/21 196 lb (88.9 kg)  05/25/21 198 lb 6.6 oz (90 kg)  03/23/21 199 lb (90.3 kg)   BP Readings from Last 3 Encounters:  07/07/21 (!) 142/70  05/29/21 140/85  03/23/21 (!) 158/80         Past Medical History:  Diagnosis Date   Allergic rhinitis 09/05/2014   Allergic rhinitis, cause unspecified 10/29/2010   Chronic constipation 10/29/2010   CKD (chronic kidney disease) stage 3, GFR 30-59 ml/min (HCC) 04/28/2017   Eczema 10/29/2010   Gait difficulty    H/O: hysterectomy 10/29/2010   History of cervical cancer 10/29/2010   HTN (hypertension) 10/29/2010   Hyperlipidemia    Hypertension    Impaired glucose tolerance 08/16/2012   Loss of appetite    Memory loss    Orthostatic hypotension 06/05/2012   Osteoporosis    Right hip pain    Seizures ([place])    Vascular dementia ([place])    Vitamin D deficiency 09/05/2014   Past Surgical History:  Procedure Laterality Date   ABDOMINAL HYSTERECTOMY  1970   BREAST BIOPSY  1980's   benign   CATARACT EXTRACTION W/ INTRAOCULAR LENS  IMPLANT,  BILATERAL  01/2011   EYE SURGERY      reports that she quit smoking about 50 years ago. Her smoking use included cigarettes. She has never used smokeless tobacco. She reports that she does not drink alcohol and does not use drugs. family history includes Diabetes in her father; High blood pressure in her daughter; Hypertension in her father and mother. No Known Allergies Current Outpatient Medications on File Prior to Visit  Medication Sig Dispense Refill   acetaminophen (TYLENOL) 500 MG tablet Take 1,000 mg by mouth every 6 (six) hours as needed for headache or fever (pain).     albuterol (PROVENTIL) (2.5 MG/3ML) 0.083% nebulizer solution Take 3 mLs (2.5 mg total) by nebulization every 6 (six) hours as needed for wheezing or shortness of breath. 150 mL 1   albuterol (VENTOLIN HFA) 108 (90 Base) MCG/ACT inhaler Inhale 2 puffs into the lungs every 6 (six) hours as needed for wheezing or shortness of breath.     ALPRAZolam (XANAX) 0.25 MG tablet TAKE 1 TABLET(0.25 MG) BY MOUTH TWICE DAILY AS NEEDED (Patient taking differently: Take 0.125 mg by mouth at bedtime.) 60 tablet 2   alum & mag hydroxide-simeth (MAALOX/MYLANTA) 200-200-20 MG/5ML suspension Take 30 mLs by mouth every 6 (six) hours as needed for indigestion or heartburn. 355 mL 0  amLODipine (NORVASC) 10 MG tablet TAKE 1 TABLET(10 MG) BY MOUTH DAILY 90 tablet 2   citalopram (CELEXA) 10 MG tablet TAKE 1 TABLET(10 MG) BY MOUTH DAILY (Patient taking differently: Take 10 mg by mouth every morning.) 90 tablet 3   guaiFENesin-dextromethorphan (ROBITUSSIN DM) 100-10 MG/5ML syrup Take 10 mLs by mouth every 4 (four) hours as needed for cough. 118 mL 0   labetalol (NORMODYNE) 300 MG tablet TAKE 1 TABLET(300 MG) BY MOUTH TWICE DAILY (Patient taking differently: Take 300 mg by mouth 2 (two) times daily.) 180 tablet 3   lisinopril (ZESTRIL) 40 MG tablet Take 1 tablet (40 mg total) by mouth daily. (Patient taking differently: Take 40 mg by mouth at bedtime.)  90 tablet 1   Memantine HCl-Donepezil HCl (NAMZARIC) 28-10 MG CP24 Take 1 capsule by mouth every morning.     pantoprazole (PROTONIX) 40 MG tablet Take 1 tablet (40 mg total) by mouth daily at 6 (six) AM. 30 tablet 0   Polyethyl Glycol-Propyl Glycol 0.4-0.3 % SOLN Place 1 drop into both eyes daily as needed (dry eyes).      QUEtiapine (SEROQUEL) 50 MG tablet TAKE 1 TABLET(50 MG) BY MOUTH AT BEDTIME (Patient taking differently: Take 50 mg by mouth at bedtime.) 90 tablet 1   No current facility-administered medications on file prior to visit.        ROS:  All others reviewed and negative.  Objective        PE:  BP (!) 142/70    Pulse 79    Ht 5\' 3"  (1.6 m)    Wt 196 lb (88.9 kg)    SpO2 94%    BMI 34.72 kg/m                 Constitutional: Pt appears in NAD               HENT: Head: NCAT.                Right Ear: External ear normal.                 Left Ear: External ear normal.                Eyes: . Pupils are equal, round, and reactive to light. Conjunctivae and EOM are normal               Nose: without d/c or deformity               Neck: Neck supple. Gross normal ROM               Cardiovascular: Normal rate a  nd regular rhythm.                 Pulmonary/Chest: Effort normal and breath sounds without rales or wheezing.                Abd:  Soft, NT, ND, + BS, no organomegaly               Neurological: Pt is alert. At baseline orientation, motor grossly intact               Skin: Skin is warm. No rashes, no other new lesions, LE edema - none               Psychiatric: Pt behavior is normal without agitation   Micro: none  Cardiac tracings I have personally interpreted today:  none  Pertinent Radiological findings (summarize):  none   Lab Results  Component Value Date   WBC 7.9 05/29/2021   HGB 12.5 05/29/2021   HCT 38.6 05/29/2021   PLT 257 05/29/2021   GLUCOSE 131 (H) 05/29/2021   CHOL 193 03/23/2021   TRIG 77 05/25/2021   HDL 40.70 03/23/2021   LDLDIRECT 123.0  03/23/2021   LDLCALC 98 11/16/2019   ALT 36 05/29/2021   AST 40 05/29/2021   NA 140 05/29/2021   K 3.3 (L) 05/29/2021   CL 104 05/29/2021   CREATININE 0.89 05/29/2021   BUN 20 05/29/2021   CO2 27 05/29/2021   TSH 1.58 03/23/2021   HGBA1C 6.8 (H) 03/23/2021   Assessment/Plan:  Teresa Riley is a 86 y.o. Black or African American [2] female with  has a past medical history of Allergic rhinitis (09/05/2014), Allergic rhinitis, cause unspecified (10/29/2010), Chronic constipation (10/29/2010), CKD (chronic kidney disease) stage 3, GFR 30-59 ml/min (HCC) (04/28/2017), Eczema (10/29/2010), Gait difficulty, H/O: hysterectomy (10/29/2010), History of cervical cancer (10/29/2010), HTN (hypertension) (10/29/2010), Hyperlipidemia, Hypertension, Impaired glucose tolerance (08/16/2012), Loss of appetite, Memory loss, Orthostatic hypotension (06/05/2012), Osteoporosis, Right hip pain, Seizures (HCC), Vascular dementia (HCC), and Vitamin D deficiency (09/05/2014).  Vitamin D deficiency Last vitamin D Lab Results  Component Value Date   VD25OH 34.72 03/23/2021   Low, reminded to start oral replacement   Acute respiratory failure due to COVID-19 [facility]) Resolved, cont current combivent hfa prn,  to f/u any worsening symptoms or concerns  COVID-19 Resolved, o/w stable, continue to monitor for worsening s/s.   Dementia (HCC) Overall stable, continue current med tx, family support  HTN (hypertension) BP Readings from Last 3 Encounters:  07/07/21 (!) 142/70  05/29/21 140/85  03/23/21 (!) 158/80   Mild uncontrolled, pt to continue medical treatment norvasc, linsopril, normodyne as family declined med change  Followup: No follow-ups on file.  Oliver Barre, MD 07/08/2021 4:53 AM [facility]

## 2021-07-07 NOTE — Patient Instructions (Signed)
Please continue all other medications as before, and refills have been done if requested - the combivent  Please have the pharmacy call with any other refills you may need.  Please continue your efforts at being more active, low cholesterol diet  Please keep your appointments with your specialists as you may have planned  Please make an Appointment to return in 4 months, or sooner if needed

## 2021-07-08 DIAGNOSIS — J449 Chronic obstructive pulmonary disease, unspecified: Secondary | ICD-10-CM | POA: Insufficient documentation

## 2021-07-08 NOTE — Assessment & Plan Note (Signed)
Last vitamin D Lab Results  Component Value Date   VD25OH 34.72 03/23/2021   Low, reminded to start oral replacement  

## 2021-07-08 NOTE — Assessment & Plan Note (Signed)
Overall stable, continue current med tx, family support

## 2021-07-08 NOTE — Assessment & Plan Note (Signed)
BP Readings from Last 3 Encounters:  07/07/21 (!) 142/70  05/29/21 140/85  03/23/21 (!) 158/80   Mild uncontrolled, pt to continue medical treatment norvasc, linsopril, normodyne as family declined med change

## 2021-07-08 NOTE — Assessment & Plan Note (Signed)
Resolved, cont current combivent hfa prn,  to f/u any worsening symptoms or concerns

## 2021-07-08 NOTE — Assessment & Plan Note (Signed)
Resolved, o/w stable, continue to monitor for worsening s/s.

## 2021-07-30 ENCOUNTER — Ambulatory Visit: Payer: Medicare HMO | Admitting: Neurology

## 2021-07-30 ENCOUNTER — Ambulatory Visit (INDEPENDENT_AMBULATORY_CARE_PROVIDER_SITE_OTHER): Payer: Medicare HMO | Admitting: Neurology

## 2021-07-30 ENCOUNTER — Encounter: Payer: Self-pay | Admitting: Neurology

## 2021-07-30 VITALS — BP 171/85 | HR 76 | Ht 63.0 in | Wt 194.0 lb

## 2021-07-30 DIAGNOSIS — F03B0 Unspecified dementia, moderate, without behavioral disturbance, psychotic disturbance, mood disturbance, and anxiety: Secondary | ICD-10-CM | POA: Diagnosis not present

## 2021-07-30 DIAGNOSIS — R269 Unspecified abnormalities of gait and mobility: Secondary | ICD-10-CM

## 2021-07-30 MED ORDER — NAMZARIC 28-10 MG PO CP24: 1.0000 | ORAL_CAPSULE | Freq: Every morning | ORAL | 4 refills | Status: DC

## 2021-07-30 NOTE — Progress Notes (Addendum)
Chief Complaint  Patient presents with   Follow-up    Rm 14. PCP is Dr. Cathlean Cower. Accompanied by daughters. Pt has had a few falls since the last visit. Pt reports memory is slower. C/o short term memory issues.      ASSESSMENT AND PLAN  Teresa Riley is a 87 y.o. female   Dementia  MRI of the brain in 2018 reviewed, significant generalized atrophy, ventriculomegaly, moderate to severe periventricular white matter disease,  Most consistent with central nervous system degenerative disorder, with vascular component, slow progressing, MoCA examination 19/30 in January 2013,  Previous laboratory evaluation showed no treatable etiology  Refill Namzaric  Emphasized importance of moderate exercise  Sleeping well with Seroquel 50 mg daily  Gait abnormality  Related to her significant atrophy, ventriculomegaly, and periventricular small vessel disease,  Referral to home physical therapy   DIAGNOSTIC DATA (LABS, IMAGING, TESTING) - I reviewed patient records, labs, notes, testing and imaging myself where available.   MEDICAL HISTORY:  Teresa Riley, is a 87 year old female, seen in request by her primary care doctor Cathlean Cower for evaluation of worsening memory loss, gait abnormality,  I reviewed and summarized the referring note. PMHX. 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 MRI scan of the brain in 2014, shows extensive changes of chroniic microvascular ischemia and moderate degree of generalized cerebral atrophy and ventricular enlargement which appear slightly more advanced compared with previous MRI dated 06/03/2006.  She began to have episodes of seizure-like event since 2013,  she felt overheated, then become confused, blank stares, then slump down, body jerking movement, she was empirically treated with titrating dose of antibiotic medications, repeat EEG only showed intermittent bilateral frontal slowing  Most episode happened in standing position, she was found to have 4 agent for hypertension, she has significant orthostatic blood pressure changes, with adjustment of her hypertensive medications, the positional related passing of smell has much improved, we were eventually able to taper off antiepileptic medications  Over the years, she had a slow worsening memory loss, difficulty sleeping, Seroquel was started around 2020, she become more sedentary, spent most of the time watching TV, mild increased gait abnormality, still independent in dressing, toileting, bathing, have good social support   Today's MoCA examination 19/30  PHYSICAL EXAM:   Vitals:   07/30/21 1336  BP: (!) 171/85  Pulse: 76  Weight: 194 lb (88 kg)  Height: 5\' 3"  (1.6 m)   Not recorded     Body mass index is 34.37 kg/m.  PHYSICAL EXAMNIATION:  Gen: NAD, conversant, well nourised, well groomed                     Cardiovascular: Regular rate rhythm, no peripheral edema, warm, nontender. Eyes: Conjunctivae clear without exudates or hemorrhage Neck: Supple, no carotid bruits. Pulmonary: Clear to auscultation bilaterally   NEUROLOGICAL EXAM:  MENTAL STATUS: Speech:    Speech is normal; fluent and spontaneous with normal comprehension.  Cognition: Montreal Cognitive Assessment  07/30/2021  Visuospatial/ Executive (0/5) 3  Naming (0/3) 3  Attention: Read list of digits (0/2) 2  Attention: Read list of letters (0/1) 1  Attention: Serial 7 subtraction starting at 100 (  0/3) 0  Language: Repeat phrase (0/2) 2  Language : Fluency (0/1) 1  Abstraction (0/2) 2  Delayed Recall (0/5) 0  Orientation (0/6) 5  Total 19    CRANIAL NERVES: CN II: Visual fields are full to confrontation.  Pupils are round equal and briskly reactive to light. CN III, IV, VI: extraocular movement are normal. No ptosis. CN V: Facial sensation is intact to light touch CN VII: Face is symmetric with normal eye closure  CN VIII: Hearing is normal to causal conversation. CN IX, X: Phonation is normal. CN XI: Head turning and shoulder shrug are intact  MOTOR: There is no pronator drift of out-stretched arms. Muscle bulk and tone are normal. Muscle strength is normal.  REFLEXES: Reflexes are 1 and symmetric at the biceps, triceps, knees, and ankles. Plantar responses are flexor.  SENSORY: Mild decreased vibratory sensation at bilateral toes  COORDINATION: There is no trunk or limb dysmetria noted.  GAIT/STANCE: She can get up from seated position arm crossed, wide-based, cautious  REVIEW OF SYSTEMS:  Full 14 system review of systems performed and notable only for as above All other review of systems were negative.   ALLERGIES: No Known Allergies  HOME MEDICATIONS: Current Outpatient Medications  Medication Sig Dispense Refill   acetaminophen (TYLENOL) 500 MG tablet Take 1,000 mg by mouth every 6 (six) hours as needed for headache or fever (pain).     albuterol (PROVENTIL) (2.5 MG/3ML) 0.083% nebulizer solution Take 3 mLs (2.5 mg total) by nebulization every 6 (six) hours as needed for wheezing or shortness of breath. 150 mL 1   ALPRAZolam (XANAX) 0.25 MG tablet TAKE 1 TABLET(0.25 MG) BY MOUTH TWICE DAILY AS NEEDED (Patient taking differently: Take 0.125 mg by mouth at bedtime.) 60 tablet 2   amLODipine (NORVASC) 10 MG tablet TAKE 1 TABLET(10 MG) BY MOUTH DAILY 90 tablet 2   atorvastatin (LIPITOR) 20 MG tablet Take 1 tablet (20 mg total) by mouth daily. 90 tablet 3   citalopram (CELEXA) 10 MG tablet TAKE 1 TABLET(10 MG) BY MOUTH DAILY (Patient taking differently: Take 10 mg by mouth every morning.) 90 tablet 3   guaiFENesin-dextromethorphan (ROBITUSSIN DM) 100-10 MG/5ML syrup Take 10 mLs  by mouth every 4 (four) hours as needed for cough. 118 mL 0   Ipratropium-Albuterol (COMBIVENT) 20-100 MCG/ACT AERS respimat Inhale 1 puff into the lungs 3 (three) times daily. 4 g 11   labetalol (NORMODYNE) 300 MG tablet TAKE 1 TABLET(300 MG) BY MOUTH TWICE DAILY (Patient taking differently: Take 300 mg by mouth 2 (two) times daily.) 180 tablet 3   lisinopril (ZESTRIL) 40 MG tablet Take 1 tablet (40 mg total) by mouth daily. (Patient taking differently: Take 40 mg by mouth at bedtime.) 90 tablet 1   Memantine HCl-Donepezil HCl (NAMZARIC) 28-10 MG CP24 Take 1 capsule by mouth every morning.     pantoprazole (PROTONIX) 40 MG tablet Take 1 tablet (40 mg total) by mouth daily at 6 (six) AM. 30 tablet 0   Polyethyl Glycol-Propyl Glycol 0.4-0.3 % SOLN Place 1 drop into both eyes daily as needed (dry eyes).      QUEtiapine (SEROQUEL) 50 MG tablet TAKE 1 TABLET(50 MG) BY MOUTH AT BEDTIME (Patient taking differently: Take 50 mg by mouth at bedtime.) 90 tablet 1   No current facility-administered medications for this visit.    PAST MEDICAL HISTORY: Past Medical History:  Diagnosis Date   Allergic rhinitis 09/05/2014   Allergic rhinitis, cause unspecified 10/29/2010   Chronic constipation  10/29/2010   CKD (chronic kidney disease) stage 3, GFR 30-59 ml/min (HCC) 04/28/2017   Eczema 10/29/2010   Gait difficulty    H/O: hysterectomy 10/29/2010   History of cervical cancer 10/29/2010   HTN (hypertension) 10/29/2010   Hyperlipidemia    Hypertension    Impaired glucose tolerance 08/16/2012   Loss of appetite    Memory loss    Orthostatic hypotension 06/05/2012   Osteoporosis    Right hip pain    Seizures (HCC)    Vascular dementia ([place])    Vitamin D deficiency 09/05/2014    PAST SURGICAL HISTORY: Past Surgical History:  Procedure Laterality Date   ABDOMINAL HYSTERECTOMY  1970   BREAST BIOPSY  1980's   benign   CATARACT EXTRACTION W/ INTRAOCULAR LENS  IMPLANT, BILATERAL  01/2011   EYE SURGERY       FAMILY HISTORY: Family History  Problem Relation Age of Onset   Hypertension Mother    Hypertension Father    Diabetes Father    High blood pressure Daughter        x2   Colon cancer Neg Hx    Colon polyps Neg Hx    Kidney disease Neg Hx    Gallbladder disease Neg Hx     SOCIAL HISTORY: Social History   Socioeconomic History   Marital status: Divorced    Spouse name: Not on file   Number of children: 3   Years of education: 2   Highest education level: Not on file  Occupational History   Occupation: retired Pharmacist, hospital  Tobacco Use   Smoking status: Former    Types: Cigarettes    Quit date: 07/13/1971    Years since quitting: 50.0   Smokeless tobacco: Never   Tobacco comments:    quit in 1973  Vaping Use   Vaping Use: Never used  Substance and Sexual Activity   Alcohol use: No    Alcohol/week: 0.0 standard drinks   Drug use: No   Sexual activity: Never  Other Topics Concern   Not on file  Social History Narrative   Patient lives at home with two daughters Joetta Manners) Shawnie Pons). Patient is retired. Patient has two years college.   Social Determinants of Health   Financial Resource Strain: Low Risk    Difficulty of Paying Living Expenses: Not hard at all  Food Insecurity: No Food Insecurity   Worried About Charity fundraiser in the Last Year: Never true   [place] in the Last Year: Never true  Transportation Needs: No Transportation Needs   Lack of Transportation (Medical): No   Lack of Transportation (Non-Medical): No  Physical Activity: Inactive   Days of Exercise per Week: 0 days   Minutes of Exercise per Session: 0 min  Stress: No Stress Concern Present   Feeling of Stress : Not at all  Social Connections: Socially Isolated   Frequency of Communication with Friends and Family: More than three times a week   Frequency of Social Gatherings with Friends and Family: More than three times a week   Attends Religious Services: Never    Marine scientist or Organizations: No   Attends Archivist Meetings: Never   Marital Status: Widowed  Human resources officer Violence: Not At Risk   Fear of Current or Ex-Partner: No   Emotionally Abused: No   Physically Abused: No   Sexually Abused: No    Total time spent reviewing the chart, obtaining history, examined patient, ordering  tests, documentation, consultations and family, care coordination was 9 minutes   Marcial Pacas, M.D. Ph.D.  [facility] [address] [place], [place] [address] Ph: [phone] Fax: [phone]  CC:  Biagio Borg, MD [place],  [place] [id number]  Biagio Borg, MD

## 2021-08-03 ENCOUNTER — Telehealth: Payer: Self-pay | Admitting: Neurology

## 2021-08-03 NOTE — Telephone Encounter (Signed)
Home health referral has been sent to [facility]. They will review and let me know if they can accept.

## 2021-08-24 ENCOUNTER — Ambulatory Visit: Payer: Medicare HMO | Admitting: Orthopedic Surgery

## 2021-08-27 ENCOUNTER — Telehealth: Payer: Self-pay

## 2021-08-27 ENCOUNTER — Ambulatory Visit (INDEPENDENT_AMBULATORY_CARE_PROVIDER_SITE_OTHER): Payer: Medicare HMO

## 2021-08-27 ENCOUNTER — Ambulatory Visit: Payer: Self-pay

## 2021-08-27 ENCOUNTER — Other Ambulatory Visit: Payer: Self-pay

## 2021-08-27 ENCOUNTER — Ambulatory Visit (INDEPENDENT_AMBULATORY_CARE_PROVIDER_SITE_OTHER): Payer: Medicare HMO | Admitting: Surgical

## 2021-08-27 ENCOUNTER — Encounter: Payer: Self-pay | Admitting: Orthopedic Surgery

## 2021-08-27 DIAGNOSIS — M879 Osteonecrosis, unspecified: Secondary | ICD-10-CM | POA: Diagnosis not present

## 2021-08-27 DIAGNOSIS — M25562 Pain in left knee: Secondary | ICD-10-CM | POA: Diagnosis not present

## 2021-08-27 DIAGNOSIS — M25561 Pain in right knee: Secondary | ICD-10-CM

## 2021-08-27 DIAGNOSIS — G8929 Other chronic pain: Secondary | ICD-10-CM

## 2021-08-27 MED ORDER — BUPIVACAINE HCL 0.25 % IJ SOLN
4.0000 mL | INTRAMUSCULAR | Status: AC | PRN
  Administered 2021-08-27: 4 mL via INTRA_ARTICULAR

## 2021-08-27 MED ORDER — LIDOCAINE HCL 1 % IJ SOLN
5.0000 mL | INTRAMUSCULAR | Status: AC | PRN
  Administered 2021-08-27: 5 mL

## 2021-08-27 MED ORDER — METHYLPREDNISOLONE ACETATE 40 MG/ML IJ SUSP
40.0000 mg | INTRAMUSCULAR | Status: AC | PRN
  Administered 2021-08-27: 40 mg via INTRA_ARTICULAR

## 2021-08-27 NOTE — Telephone Encounter (Signed)
Noted  

## 2021-08-27 NOTE — Progress Notes (Signed)
Office Visit Note   Patient: Teresa Riley           Date of Birth: June 09, 1935           MRN: [id number] Visit Date: 08/27/2021 Requested by: Biagio Borg, MD [place],  [place] [address] PCP: Biagio Borg, MD  Subjective: Chief Complaint  Patient presents with   Right Knee - Pain   Left Knee - Pain    HPI: Teresa Riley is a 87 y.o. female who presents to the office complaining of bilateral knee pain, right greater than left.  Patient states that overall her knee pain is fairly controlled but the right knee does cause her increased discomfort compared with the left.  She ambulates with a cane and has decreased the amount that she has walked in the last several months.  She notes occasional swelling in the right knee.  No recent falls or injuries.  She has history of MRI of the right knee that demonstrated evidence of insufficiency fracture of the medial femoral condyle in 2021..                ROS: All systems reviewed are negative as they relate to the chief complaint within the history of present illness.  Patient denies fevers or chills.  Assessment & Plan: Visit Diagnoses:  1. Chronic pain of both knees     Plan: Patient is an 87 year old female who presents today complaining primarily of right knee pain.  She has been receiving serial injections for right knee pain.  She has a history of insufficiency fracture of the right knee medial femoral condyle from prior MRI in June 2021.  Today's radiographs of the right knee show progression of this insufficiency fracture with large focal defect that extends into the subchondral bone.  The rest of her joint actually looks pretty good.  No significant degenerative changes in the left knee on today's radiographs aside from some minimal joint space narrowing of the medial compartment.  Discussed options available to patient and she would like to continue with right knee cortisone injection which has provided good relief for her  right knee pain.  Right knee was aspirated of 17 cc of synovial fluid that did not appear purulent.  Cortisone injection administered and patient tolerated procedure well.  Plan to preapproved for right knee gel injection which she was previously approved for in December 2022 but this approval expired.  Follow-up after approval for gel injection.  This patient is diagnosed with osteoarthritis of the knee(s).    Radiographs show evidence of joint space narrowing, osteophytes, subchondral sclerosis and/or subchondral cysts.  This patient has knee pain which interferes with functional and activities of daily living.    This patient has experienced inadequate response, adverse effects and/or intolerance with conservative treatments such as acetaminophen, NSAIDS, topical creams, physical therapy or regular exercise, knee bracing and/or weight loss.   This patient has experienced inadequate response or has a contraindication to intra articular steroid injections for at least 3 months.   This patient is not scheduled to have a total knee replacement within 6 months of starting treatment with viscosupplementation.   Follow-Up Instructions: No follow-ups on file.   Orders:  Orders Placed This Encounter  Procedures   XR Knee 1-2 Views Right   XR Knee 1-2 Views Left   No orders of the defined types were placed in this encounter.     Procedures: Large Joint Inj: R knee on  08/27/2021 7:52 PM Indications: diagnostic evaluation, joint swelling and pain Details: 18 G 1.5 in needle, superolateral approach  Arthrogram: No  Medications: 5 mL lidocaine 1 %; 40 mg methylPREDNISolone acetate 40 MG/ML; 4 mL bupivacaine 0.25 % Aspirate: 17 mL Outcome: tolerated well, no immediate complications Procedure, treatment alternatives, risks and benefits explained, specific risks discussed. Consent was given by the patient. Immediately prior to procedure a time out was called to verify the correct patient,  procedure, equipment, support staff and site/side marked as required. Patient was prepped and draped in the usual sterile fashion.      Clinical Data: No additional findings.  Objective: Vital Signs: There were no vitals taken for this visit.  Physical Exam:  Constitutional: Patient appears well-developed HEENT:  Head: Normocephalic Eyes:EOM are normal Neck: Normal range of motion Cardiovascular: Normal rate Pulmonary/chest: Effort normal Neurologic: Patient is alert Skin: Skin is warm Psychiatric: Patient has normal mood and affect  Ortho Exam: Ortho exam demonstrates right knee with positive effusion.  Tenderness primarily over the medial joint line.  No tenderness over the lateral joint line.  No calf tenderness.  Negative Homans' sign.  Able to perform straight leg raise without difficulty.  Range of motion from 0 degrees extension to greater than 90 degrees of knee flexion.  No pain with hip range of motion.  Specialty Comments:  No specialty comments available.  Imaging: No results found.   PMFS History: Patient Active Problem List   Diagnosis Date Noted   Moderate dementia without behavioral disturbance, psychotic disturbance, mood disturbance, or anxiety 07/30/2021   Gait abnormality 07/30/2021   Acute respiratory failure due to COVID-19 ([place]) 05/26/2021   COVID-19 05/25/2021   Right knee pain 11/12/2019   Pain and swelling of right lower leg 11/12/2019   Weight gain 10/17/2019   Wheezing 10/17/2019   Acute dysfunction of left eustachian tube 01/16/2019   Hyperglycemia 01/16/2019   CKD (chronic kidney disease) stage 3, GFR 30-59 ml/min (HCC) 04/28/2017   Hemorrhoids 04/28/2017   Bilateral hearing loss 11/06/2016   Headache 11/05/2016   Thrush 11/05/2016   Paresthesia 09/27/2016   Vertigo 07/04/2016   Urinary retention 06/29/2016   Gait disorder 06/29/2016   General weakness 09/16/2015   Seizures ([place]) 08/27/2015   Dry eyes 05/27/2015   Dry skin  03/12/2015   Rash and nonspecific skin eruption 03/12/2015   Esophageal dysmotility 01/15/2015   Colon cancer screening - declined 01/15/2015   Syncope and collapse 10/03/2014   Constipation 09/05/2014   Encounter for well adult exam with abnormal findings 09/05/2014   Osteoporosis 09/05/2014   Vitamin D deficiency 09/05/2014   Allergic rhinitis 09/05/2014   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   Insomnia 10/02/2013   Weight loss 04/04/2013   Localization-related focal epilepsy with simple partial seizures ([place]) 11/15/2012   Dementia ([place]) 11/15/2012   Hyperlipidemia 08/06/2012   HTN (hypertension) 10/29/2010   Eczema 10/29/2010   History of cervical cancer 10/29/2010   Past Medical History:  Diagnosis Date   Allergic rhinitis 09/05/2014   Allergic rhinitis, cause unspecified 10/29/2010   Chronic constipation 10/29/2010   CKD (chronic kidney disease) stage 3, GFR 30-59 ml/min (HCC) 04/28/2017   Eczema 10/29/2010   Gait difficulty    H/O: hysterectomy 10/29/2010   History of cervical cancer 10/29/2010   HTN (hypertension) 10/29/2010   Hyperlipidemia    Hypertension    Impaired glucose tolerance 08/16/2012   Loss of appetite    Memory loss    Orthostatic hypotension 06/05/2012   Osteoporosis  Right hip pain    Seizures (HCC)    Vascular dementia ([place])    Vitamin D deficiency 09/05/2014    Family History  Problem Relation Age of Onset   Hypertension Mother    Hypertension Father    Diabetes Father    High blood pressure Daughter        x2   Colon cancer Neg Hx    Colon polyps Neg Hx    Kidney disease Neg Hx    Gallbladder disease Neg Hx     Past Surgical History:  Procedure Laterality Date   ABDOMINAL HYSTERECTOMY  1970   BREAST BIOPSY  1980's   benign   CATARACT EXTRACTION W/ INTRAOCULAR LENS  IMPLANT, BILATERAL  01/2011   EYE SURGERY     Social History   Occupational History   Occupation: retired Pharmacist, hospital  Tobacco Use   Smoking status: Former    Types: Cigarettes     Quit date: 07/13/1971    Years since quitting: 50.1   Smokeless tobacco: Never   Tobacco comments:    quit in 1973  Vaping Use   Vaping Use: Never used  Substance and Sexual Activity   Alcohol use: No    Alcohol/week: 0.0 standard drinks   Drug use: No   Sexual activity: Never

## 2021-08-27 NOTE — Telephone Encounter (Signed)
Auth needed for right knee gel injection  

## 2021-09-03 ENCOUNTER — Telehealth: Payer: Self-pay

## 2021-09-03 NOTE — Telephone Encounter (Signed)
VOB submitted for Monovisc, right knee BV pending  

## 2021-09-14 ENCOUNTER — Telehealth: Payer: Self-pay

## 2021-09-14 NOTE — Telephone Encounter (Signed)
Per patient's daughter, patient will hold off on getting gel injection, due to having cortisone injection.  Will call back to schedule at a later date for gel injection. ? ?Approved for Monovisc, right knee. ?Buy & Bill ?Patient will be responsible for 20% OOP. ?Co-pay of $20.00 ?No PA required ?

## 2021-09-16 ENCOUNTER — Telehealth: Payer: Self-pay

## 2021-09-16 MED ORDER — ALPRAZOLAM 0.25 MG PO TABS
ORAL_TABLET | ORAL | 2 refills | Status: DC
Start: 2021-09-16 — End: 2021-09-16

## 2021-09-16 MED ORDER — ALPRAZOLAM 0.25 MG PO TABS: ORAL_TABLET | ORAL | 2 refills | Status: DC

## 2021-09-16 NOTE — Telephone Encounter (Signed)
Already done Sep 14 2021 ?

## 2021-09-16 NOTE — Telephone Encounter (Signed)
Pt daughter is calling requesting a refill on: ?ALPRAZolam (XANAX) 0.25 MG tablet ? ?Pharmacy: ?[id number] - [place], [place] - [address] ? ?LOV 07/07/21 ?

## 2021-09-17 NOTE — Telephone Encounter (Signed)
Patient daughter notified that rx was sent to pharmacy. 

## 2021-10-23 ENCOUNTER — Telehealth: Payer: Self-pay

## 2021-10-23 MED ORDER — QUETIAPINE FUMARATE 50 MG PO TABS
50.0000 mg | ORAL_TABLET | Freq: Every day | ORAL | 1 refills | Status: DC
Start: 2021-10-23 — End: 2022-03-16

## 2021-10-23 NOTE — Telephone Encounter (Signed)
Ok - done earlier today to her pharmacy listed ?

## 2021-10-23 NOTE — Telephone Encounter (Signed)
Pt daughter calling to request a refill on: ?QUEtiapine (SEROQUEL) 50 MG tablet ? ?Pharmacy: ?[facility], [place] AT [place] ? ?LOV 07/07/21 ?ROV 11/05/21 ?

## 2021-10-26 NOTE — Telephone Encounter (Signed)
Patient's daughter notified and has picked up prescription ?

## 2021-11-05 ENCOUNTER — Ambulatory Visit: Payer: Medicare HMO | Admitting: Internal Medicine

## 2022-02-10 ENCOUNTER — Telehealth: Payer: Self-pay | Admitting: Internal Medicine

## 2022-02-10 NOTE — Telephone Encounter (Signed)
Daughter would like to get mother approved for Prolia.

## 2022-02-11 NOTE — Telephone Encounter (Signed)
Forward msg to [employer], CMA who works on [employer].Marland KitchenRaechel Chute

## 2022-02-16 NOTE — Telephone Encounter (Signed)
Prolia VOB initiated via [email or web address]  Re-start  

## 2022-02-17 ENCOUNTER — Ambulatory Visit: Payer: Medicare HMO | Admitting: Orthopedic Surgery

## 2022-02-18 ENCOUNTER — Ambulatory Visit: Payer: Medicare HMO | Admitting: Internal Medicine

## 2022-02-18 NOTE — Telephone Encounter (Signed)
Prior auth required for PROLIA  PA PROCESS DETAILS: PA is required and is currently not on file. Please call Medical Review at [phone] to initiate PA

## 2022-02-24 ENCOUNTER — Telehealth: Payer: Self-pay | Admitting: Internal Medicine

## 2022-02-24 NOTE — Telephone Encounter (Signed)
Left message for patient to call back to schedule Medicare Annual Wellness Visit   Last AWV  02/24/21  Please schedule at anytime with [facility]-Nurse Health Advisor if patient calls the office back.      Any questions, please call me at [phone]  

## 2022-03-10 NOTE — Telephone Encounter (Signed)
Prior Authorization initiated for PROLIA via [email or web address] KEYAlben Spittle PA Case ID: [id number]

## 2022-03-11 ENCOUNTER — Telehealth: Payer: Self-pay

## 2022-03-11 ENCOUNTER — Ambulatory Visit (INDEPENDENT_AMBULATORY_CARE_PROVIDER_SITE_OTHER): Payer: Medicare HMO

## 2022-03-11 VITALS — Ht 63.0 in | Wt 200.0 lb

## 2022-03-11 DIAGNOSIS — Z0000 Encounter for general adult medical examination without abnormal findings: Secondary | ICD-10-CM | POA: Diagnosis not present

## 2022-03-11 NOTE — Telephone Encounter (Signed)
Patient's daughter, [name] is requesting the following refills: Alprazolam 0.25 mg, Quetiapine 50 mg and Memantine 28/10 mg to be sent to [facility].  CB# [phone]

## 2022-03-11 NOTE — Telephone Encounter (Signed)
Pt ready for scheduling on or after 03/11/22  Out-of-pocket cost due at time of visit: $301  Primary: Medicare Prolia co-insurance: 20% (approximately $276) Admin fee co-insurance: 20% (approximately $25)  Secondary: Medicaid Prolia co-insurance: plan exclusion Admin fee co-insurance: plan exclusion  Deductible: does not apply  Prior Auth: APPROVED KeyAlben Spittle - PA Case ID: [id number] Valid: 03/10/22-07/11/22  ** This summary of benefits is an estimation of the patient's out-of-pocket cost. Exact cost may vary based on individual plan coverage.

## 2022-03-11 NOTE — Progress Notes (Cosign Needed Addendum)
Subjective:   Teresa Riley is a 87 y.o. female who presents for Medicare Annual (Subsequent) preventive examination.  Review of Systems    Virtual Visit via Telephone Note  I connected with  Teresa Riley on 03/11/22 at  2:15 PM EDT by telephone and verified that I am speaking with the correct person using two identifiers.  Location: Patient: Home with daughter Provider: [facility] Persons participating in the virtual visit: patient/Nurse Health Advisor   I discussed the limitations, risks, security and privacy concerns of performing an evaluation and management service by telephone and the availability of in person appointments. The patient expressed understanding and agreed to proceed.  Interactive audio and video telecommunications were attempted between this nurse and patient, however failed, due to patient having technical difficulties OR patient did not have access to video capability.  We continued and completed visit with audio only.  Some vital signs may be absent or patient reported.   Mickeal Needy, LPN  Cardiac Risk Factors include: advanced age (>67men, >61 women);dyslipidemia;family history of premature cardiovascular disease;hypertension;obesity (BMI >30kg/m2)     Objective:    Today's Vitals   03/11/22 1434  Weight: 200 lb (90.7 kg)  Height: 5\' 3"  (1.6 m)   Body mass index is 35.43 kg/m.     03/11/2022    2:26 PM 05/25/2021   10:28 AM 02/24/2021   11:19 AM 06/23/2014   11:34 AM 05/08/2014    1:14 PM 05/02/2014    1:11 PM  Advanced Directives  Does Patient Have a Medical Advance Directive? Yes No No No No No  Type of 05/04/2014 of [place];Living will       Copy of Healthcare Power of Attorney in Chart? No - copy requested       Would patient like information on creating a medical advance directive?  No - Patient declined No - Patient declined  No - patient declined information     Current Medications  (verified) Outpatient Encounter Medications as of 03/11/2022  Medication Sig   acetaminophen (TYLENOL) 500 MG tablet Take 1,000 mg by mouth every 6 (six) hours as needed for headache or fever (pain).   albuterol (PROVENTIL) (2.5 MG/3ML) 0.083% nebulizer solution Take 3 mLs (2.5 mg total) by nebulization every 6 (six) hours as needed for wheezing or shortness of breath.   ALPRAZolam (XANAX) 0.25 MG tablet TAKE 1 TABLET(0.25 MG) BY MOUTH TWICE DAILY AS NEEDED   amLODipine (NORVASC) 10 MG tablet TAKE 1 TABLET(10 MG) BY MOUTH DAILY   atorvastatin (LIPITOR) 20 MG tablet Take 1 tablet (20 mg total) by mouth daily.   citalopram (CELEXA) 10 MG tablet TAKE 1 TABLET(10 MG) BY MOUTH DAILY (Patient taking differently: Take 10 mg by mouth every morning.)   guaiFENesin-dextromethorphan (ROBITUSSIN DM) 100-10 MG/5ML syrup Take 10 mLs by mouth every 4 (four) hours as needed for cough.   Ipratropium-Albuterol (COMBIVENT) 20-100 MCG/ACT AERS respimat Inhale 1 puff into the lungs 3 (three) times daily.   labetalol (NORMODYNE) 300 MG tablet TAKE 1 TABLET(300 MG) BY MOUTH TWICE DAILY (Patient taking differently: Take 300 mg by mouth 2 (two) times daily.)   lisinopril (ZESTRIL) 40 MG tablet Take 1 tablet (40 mg total) by mouth daily. (Patient taking differently: Take 40 mg by mouth at bedtime.)   Memantine HCl-Donepezil HCl (NAMZARIC) 28-10 MG CP24 Take 1 capsule by mouth every morning.   pantoprazole (PROTONIX) 40 MG tablet Take 1 tablet (40 mg total) by mouth daily at 6 (  six) AM.   Polyethyl Glycol-Propyl Glycol 0.4-0.3 % SOLN Place 1 drop into both eyes daily as needed (dry eyes).    QUEtiapine (SEROQUEL) 50 MG tablet Take 1 tablet (50 mg total) by mouth at bedtime.   No facility-administered encounter medications on file as of 03/11/2022.    Allergies (verified) Patient has no known allergies.   History: Past Medical History:  Diagnosis Date   Allergic rhinitis 09/05/2014   Allergic rhinitis, cause  unspecified 10/29/2010   Chronic constipation 10/29/2010   CKD (chronic kidney disease) stage 3, GFR 30-59 ml/min (HCC) 04/28/2017   Eczema 10/29/2010   Gait difficulty    H/O: hysterectomy 10/29/2010   History of cervical cancer 10/29/2010   HTN (hypertension) 10/29/2010   Hyperlipidemia    Hypertension    Impaired glucose tolerance 08/16/2012   Loss of appetite    Memory loss    Orthostatic hypotension 06/05/2012   Osteoporosis    Right hip pain    Seizures (HCC)    Vascular dementia ([place])    Vitamin D deficiency 09/05/2014   Past Surgical History:  Procedure Laterality Date   ABDOMINAL HYSTERECTOMY  1970   BREAST BIOPSY  1980's   benign   CATARACT EXTRACTION W/ INTRAOCULAR LENS  IMPLANT, BILATERAL  01/2011   EYE SURGERY     Family History  Problem Relation Age of Onset   Hypertension Mother    Hypertension Father    Diabetes Father    High blood pressure Daughter        x2   Colon cancer Neg Hx    Colon polyps Neg Hx    Kidney disease Neg Hx    Gallbladder disease Neg Hx    Social History   Socioeconomic History   Marital status: Divorced    Spouse name: Not on file   Number of children: 3   Years of education: 2   Highest education level: Not on file  Occupational History   Occupation: retired Pharmacist, hospital  Tobacco Use   Smoking status: Former    Types: Cigarettes    Quit date: 07/13/1971    Years since quitting: 50.6   Smokeless tobacco: Never   Tobacco comments:    quit in 1973  Vaping Use   Vaping Use: Never used  Substance and Sexual Activity   Alcohol use: No    Alcohol/week: 0.0 standard drinks of alcohol   Drug use: No   Sexual activity: Never  Other Topics Concern   Not on file  Social History Narrative   Patient lives at home with two daughters Joetta Manners) Shawnie Pons). Patient is retired. Patient has two years college.   Social Determinants of Health   Financial Resource Strain: Low Risk  (03/11/2022)   Overall Financial Resource Strain  (CARDIA)    Difficulty of Paying Living Expenses: Not hard at all  Food Insecurity: No Food Insecurity (03/11/2022)   Hunger Vital Sign    Worried About Running Out of Food in the Last Year: Never true    Ran Out of Food in the Last Year: Never true  Transportation Needs: No Transportation Needs (03/11/2022)   PRAPARE - Hydrologist (Medical): No    Lack of Transportation (Non-Medical): No  Physical Activity: Inactive (03/11/2022)   Exercise Vital Sign    Days of Exercise per Week: 0 days    Minutes of Exercise per Session: 0 min  Stress: No Stress Concern Present (03/11/2022)   [employer]  [employer] - Occupational Stress Questionnaire    Feeling of Stress : Not at all  Social Connections: Socially Isolated (03/11/2022)   Social Connection and Isolation Panel [NHANES]    Frequency of Communication with Friends and Family: More than three times a week    Frequency of Social Gatherings with Friends and Family: More than three times a week    Attends Religious Services: Never    Marine scientist or Organizations: No    Attends Archivist Meetings: Never    Marital Status: Widowed    Tobacco Counseling Counseling given: Not Answered Tobacco comments: quit in 1973   Clinical Intake:  Pre-visit preparation completed: Yes  Pain : No/denies pain     BMI - recorded: 35.43 (07/30/2021) Nutritional Status: BMI > 30  Obese Nutritional Risks: None Diabetes: No (according to daughter) CBG done?: No Did pt. bring in CBG monitor from home?: No  How often do you need to have someone help you when you read instructions, pamphlets, or other written materials from your doctor or pharmacy?: 1 - Never What is the last grade level you completed in school?: HSG; some college  Diabetic? no  Interpreter Needed?: No  Information entered by :: Lisette Abu, LPN.   Activities of Daily Living    03/11/2022    2:39 PM 05/25/2021     5:00 PM  In your present state of health, do you have any difficulty performing the following activities:  Hearing? 1 0  Vision? 0 0  Difficulty concentrating or making decisions? 1 0  Walking or climbing stairs? 1 0  Dressing or bathing? 0 0  Doing errands, shopping? 1 0  Preparing Food and eating ? Y   Using the Toilet? N   In the past six months, have you accidently leaked urine? N   Do you have problems with loss of bowel control? N   Managing your Medications? Y   Managing your Finances? Y   Housekeeping or managing your Housekeeping? Y     Patient Care Team: Biagio Borg, MD as PCP - [facility], [facility] as Consulting Physician (Ophthalmology)  Indicate any recent Medical Services you may have received from other than [facility] providers in the past year (date may be approximate).     Assessment:   This is a routine wellness examination for Charlie.  Hearing/Vision screen Hearing Screening - Comments:: Patient has bilateral hearing loss. No hearing aids. Vision Screening - Comments:: Wears rx glasses - up to date with routine eye exams with [facility]   Dietary issues and exercise activities discussed: Current Exercise Habits: The patient does not participate in regular exercise at present, Exercise limited by: None identified   Goals Addressed             This Visit's Progress    DIET - INCREASE WATER INTAKE        Depression Screen    03/11/2022    2:29 PM 03/23/2021    2:02 PM 02/24/2021   11:30 AM 06/27/2020   11:28 AM 11/12/2019    5:04 PM 11/12/2019    4:35 PM 10/17/2019    3:15 PM  PHQ 2/9 Scores  PHQ - 2 Score 0 0 0 0 0 0 1  PHQ- 9 Score    0 0      Fall Risk    03/11/2022    2:27 PM 03/23/2021    2:02 PM 02/24/2021   11:20 AM 11/12/2019  4:34 PM 10/17/2019    3:15 PM  Fall Risk   Falls in the past year? 0 0 0 0 0  Number falls in past yr: 0 0 0 0   Injury with Fall? 0 0 0 0   Risk for fall due to : No  Fall Risks Impaired balance/gait No Fall Risks No Fall Risks   Follow up Falls prevention discussed  Falls evaluation completed      FALL RISK PREVENTION PERTAINING TO THE HOME:  Any stairs in or around the home? Yes  If so, are there any without handrails? No  Home free of loose throw rugs in walkways, pet beds, electrical cords, etc? Yes  Adequate lighting in your home to reduce risk of falls? Yes   ASSISTIVE DEVICES UTILIZED TO PREVENT FALLS:  Life alert? No  Use of a cane, [place] or w/c? Yes  Grab bars in the bathroom? No  Shower chair or bench in shower? Yes  Elevated toilet seat or a handicapped toilet? No   TIMED UP AND GO:  Was the test performed? No .  Length of time to ambulate 10 feet: n/a sec.   Appearance of gait: Gait not evaluated during this visit.  Cognitive Function: Patient has current diagnosis of cognitive impairment. Patient is followed by neurology for ongoing assessment. Patient is unable to complete screening 6CIT or MMSE.      03/11/2022    2:40 PM 08/10/2018    1:05 PM 08/15/2017    1:53 PM 03/31/2017    1:13 PM 09/27/2016    3:57 PM  MMSE - Mini Mental State Exam  Not completed: Unable to complete      Orientation to time  3 5 4 4   Orientation to Place  5 5 5 5   Registration  3 3 3 3   Attention/ Calculation  3 0 5 5  Recall  2 3 2 1   Language- name 2 objects  2 2 2 2   Language- repeat  1 1 1 1   Language- follow 3 step command  3 3 3 3   Language- read & follow direction  1 1 1 1   Write a sentence  1 1 1 1   Copy design  1 0 1 1  Total score  25 24 28 27       07/30/2021    2:00 PM  Montreal Cognitive Assessment   Visuospatial/ Executive (0/5) 3  Naming (0/3) 3  Attention: Read list of digits (0/2) 2  Attention: Read list of letters (0/1) 1  Attention: Serial 7 subtraction starting at 100 (0/3) 0  Language: Repeat phrase (0/2) 2  Language : Fluency (0/1) 1  Abstraction (0/2) 2  Delayed Recall (0/5) 0  Orientation (0/6) 5  Total 19       Immunizations Immunization History  Administered Date(s) Administered   DTaP 07/13/2007   Fluad Quad(high Dose 65+) 04/26/2019, 06/27/2020, 03/23/2021   Influenza Split 06/25/2011   Influenza, High Dose Seasonal PF 05/27/2015, 05/28/2016, 04/28/2017, 05/02/2018   Influenza, Seasonal, Injecte, Preservative Fre 07/11/2012   Influenza,inj,Quad PF,6+ Mos 04/04/2013   Influenza-Unspecified 03/12/2014, 04/11/2018   PFIZER(Purple Top)SARS-COV-2 Vaccination 09/22/2019, 10/16/2019   PPD Test 01/19/2011   Pneumococcal Conjugate-13 07/12/2001, 10/02/2013   Pneumococcal Polysaccharide-23 10/29/2010   Varicella 02/10/2008    TDAP status: Due, Education has been provided regarding the importance of this vaccine. Advised may receive this vaccine at local pharmacy or Health Dept. Aware to provide a copy of the vaccination record if obtained from local  pharmacy or Health Dept. Verbalized acceptance and understanding.  Flu Vaccine status: Due, Education has been provided regarding the importance of this vaccine. Advised may receive this vaccine at local pharmacy or Health Dept. Aware to provide a copy of the vaccination record if obtained from local pharmacy or Health Dept. Verbalized acceptance and understanding.  Pneumococcal vaccine status: Up to date  Covid-19 vaccine status: Completed vaccines  Qualifies for Shingles Vaccine? Yes   Zostavax completed No   Shingrix Completed?: No.    Education has been provided regarding the importance of this vaccine. Patient has been advised to call insurance company to determine out of pocket expense if they have not yet received this vaccine. Advised may also receive vaccine at local pharmacy or Health Dept. Verbalized acceptance and understanding.  Screening Tests Health Maintenance  Topic Date Due   Zoster Vaccines- Shingrix (1 of 2) Never done   COVID-19 Vaccine (3 - Pfizer series) 12/11/2019   INFLUENZA VACCINE  02/09/2022   TETANUS/TDAP  03/23/2022  (Originally 07/12/2018)   Pneumonia Vaccine 24+ Years old  Completed   DEXA SCAN  Completed   HPV VACCINES  Aged Out    Health Maintenance  Health Maintenance Due  Topic Date Due   Zoster Vaccines- Shingrix (1 of 2) Never done   COVID-19 Vaccine (3 - Pfizer series) 12/11/2019   INFLUENZA VACCINE  02/09/2022    Colorectal cancer screening: No longer required.   Mammogram status: No longer required due to age.  Bone Density status: Patient on Prolia  Lung Cancer Screening: (Low Dose CT Chest recommended if Age 70-80 years, 30 pack-year currently smoking OR have quit w/in 15years.) does not qualify.   Lung Cancer Screening Referral: no  Additional Screening:  Hepatitis C Screening: does not qualify; Completed no  Vision Screening: Recommended annual ophthalmology exams for early detection of glaucoma and other disorders of the eye. Is the patient up to date with their annual eye exam?  Yes  Who is the provider or what is the name of the office in which the patient attends annual eye exams? [facility] If pt is not established with a provider, would they like to be referred to a provider to establish care? No .   Dental Screening: Recommended annual dental exams for proper oral hygiene  Community Resource Referral / Chronic Care Management: CRR required this visit?  No   CCM required this visit?  No      Plan:     I have personally reviewed and noted the following in the patient's chart:   Medical and social history Use of alcohol, tobacco or illicit drugs  Current medications and supplements including opioid prescriptions. Patient is not currently taking opioid prescriptions. Functional ability and status Nutritional status Physical activity Advanced directives List of other physicians Hospitalizations, surgeries, and ER visits in previous 12 months Vitals Screenings to include cognitive, depression, and falls Referrals and appointments  In  addition, I have reviewed and discussed with patient certain preventive protocols, quality metrics, and best practice recommendations. A written personalized care plan for preventive services as well as general preventive health recommendations were provided to patient.     Mickeal Needy, LPN   07/14/7251   Nurse Notes:  Patient has current diagnosis of cognitive impairment. Patient is followed by neurology for ongoing assessment. Patient is unable to complete screening 6CIT or MMSE.  Patient's POA provided weight and height for this visit.    Medical screening examination/treatment/procedure(s) were performed by non-physician  practitioner and as supervising physician I was immediately available for consultation/collaboration.  I agree with above. Lew Dawes, MD

## 2022-03-11 NOTE — Telephone Encounter (Signed)
KeyAlben Riley - PA Case ID: [id number] Valid: 03/10/22-07/11/22

## 2022-03-11 NOTE — Patient Instructions (Signed)
Teresa Riley , Thank you for taking time to come for your Medicare Wellness Visit. I appreciate your ongoing commitment to your health goals. Please review the following plan we discussed and let me know if I can assist you in the future.   Screening recommendations/referrals: Colonoscopy: No longer recommended due to age Mammogram: No longer recommended due to age Bone Density: No longer recommended due to age Recommended yearly ophthalmology/optometry visit for glaucoma screening and checkup Recommended yearly dental visit for hygiene and checkup  Vaccinations: Influenza vaccine: 03/23/2021; due Fall Season 2023 Pneumococcal vaccine: 10/29/2010, 10/02/2013 Tdap vaccine: due or declined Shingles vaccine: due or declined  Covid-19:09/22/2019, 10/16/2019  Advanced directives: Yes; Please bring a copy of your health care power of attorney and living will to the office at your convenience.  Conditions/risks identified: Yes  Next appointment: Follow up in one year for your annual wellness visit Nurse Percell Miller.   Preventive Care 24 Years and Older, Female Preventive care refers to lifestyle choices and visits with your health care provider that can promote health and wellness. What does preventive care include? A yearly physical exam. This is also called an annual well check. Dental exams once or twice a year. Routine eye exams. Ask your health care provider how often you should have your eyes checked. Personal lifestyle choices, including: Daily care of your teeth and gums. Regular physical activity. Eating a healthy diet. Avoiding tobacco and drug use. Limiting alcohol use. Practicing safe sex. Taking low-dose aspirin every day. Taking vitamin and mineral supplements as recommended by your health care provider. What happens during an annual well check? The services and screenings done by your health care provider during your annual well check will depend on your age, overall health,  lifestyle risk factors, and family history of disease. Counseling  Your health care provider may ask you questions about your: Alcohol use. Tobacco use. Drug use. Emotional well-being. Home and relationship well-being. Sexual activity. Eating habits. History of falls. Memory and ability to understand (cognition). Work and work Astronomer. Reproductive health. Screening  You may have the following tests or measurements: Height, weight, and BMI. Blood pressure. Lipid and cholesterol levels. These may be checked every 5 years, or more frequently if you are over 56 years old. Skin check. Lung cancer screening. You may have this screening every year starting at age 47 if you have a 30-pack-year history of smoking and currently smoke or have quit within the past 15 years. Fecal occult blood test (FOBT) of the stool. You may have this test every year starting at age 54. Flexible sigmoidoscopy or colonoscopy. You may have a sigmoidoscopy every 5 years or a colonoscopy every 10 years starting at age 35. Hepatitis C blood test. Hepatitis B blood test. Sexually transmitted disease (STD) testing. Diabetes screening. This is done by checking your blood sugar (glucose) after you have not eaten for a while (fasting). You may have this done every 1-3 years. Bone density scan. This is done to screen for osteoporosis. You may have this done starting at age 66. Mammogram. This may be done every 1-2 years. Talk to your health care provider about how often you should have regular mammograms. Talk with your health care provider about your test results, treatment options, and if necessary, the need for more tests. Vaccines  Your health care provider may recommend certain vaccines, such as: Influenza vaccine. This is recommended every year. Tetanus, diphtheria, and acellular pertussis (Tdap, Td) vaccine. You may need a Td booster every 10  years. Zoster vaccine. You may need this after age  15. Pneumococcal 13-valent conjugate (PCV13) vaccine. One dose is recommended after age 63. Pneumococcal polysaccharide (PPSV23) vaccine. One dose is recommended after age 49. Talk to your health care provider about which screenings and vaccines you need and how often you need them. This information is not intended to replace advice given to you by your health care provider. Make sure you discuss any questions you have with your health care provider. Document Released: 07/25/2015 Document Revised: 03/17/2016 Document Reviewed: 04/29/2015 Elsevier Interactive Patient Education  2017 [place] Prevention in the Home Falls can cause injuries. They can happen to people of all ages. There are many things you can do to make your home safe and to help prevent falls. What can I do on the outside of my home? Regularly fix the edges of walkways and driveways and fix any cracks. Remove anything that might make you trip as you walk through a door, such as a raised step or threshold. Trim any bushes or trees on the path to your home. Use bright outdoor lighting. Clear any walking paths of anything that might make someone trip, such as rocks or tools. Regularly check to see if handrails are loose or broken. Make sure that both sides of any steps have handrails. Any raised decks and porches should have guardrails on the edges. Have any leaves, snow, or ice cleared regularly. Use sand or salt on walking paths during winter. Clean up any spills in your garage right away. This includes oil or grease spills. What can I do in the bathroom? Use night lights. Install grab bars by the toilet and in the tub and shower. Do not use towel bars as grab bars. Use non-skid mats or decals in the tub or shower. If you need to sit down in the shower, use a plastic, non-slip stool. Keep the floor dry. Clean up any water that spills on the floor as soon as it happens. Remove soap buildup in the tub or shower  regularly. Attach bath mats securely with double-sided non-slip rug tape. Do not have throw rugs and other things on the floor that can make you trip. What can I do in the bedroom? Use night lights. Make sure that you have a light by your bed that is easy to reach. Do not use any sheets or blankets that are too big for your bed. They should not hang down onto the floor. Have a firm chair that has side arms. You can use this for support while you get dressed. Do not have throw rugs and other things on the floor that can make you trip. What can I do in the kitchen? Clean up any spills right away. Avoid walking on wet floors. Keep items that you use a lot in easy-to-reach places. If you need to reach something above you, use a strong step stool that has a grab bar. Keep electrical cords out of the way. Do not use floor polish or wax that makes floors slippery. If you must use wax, use non-skid floor wax. Do not have throw rugs and other things on the floor that can make you trip. What can I do with my stairs? Do not leave any items on the stairs. Make sure that there are handrails on both sides of the stairs and use them. Fix handrails that are broken or loose. Make sure that handrails are as long as the stairways. Check any carpeting to make sure  that it is firmly attached to the stairs. Fix any carpet that is loose or worn. Avoid having throw rugs at the top or bottom of the stairs. If you do have throw rugs, attach them to the floor with carpet tape. Make sure that you have a light switch at the top of the stairs and the bottom of the stairs. If you do not have them, ask someone to add them for you. What else can I do to help prevent falls? Wear shoes that: Do not have high heels. Have rubber bottoms. Are comfortable and fit you well. Are closed at the toe. Do not wear sandals. If you use a stepladder: Make sure that it is fully opened. Do not climb a closed stepladder. Make sure that  both sides of the stepladder are locked into place. Ask someone to hold it for you, if possible. Clearly mark and make sure that you can see: Any grab bars or handrails. First and last steps. Where the edge of each step is. Use tools that help you move around (mobility aids) if they are needed. These include: Canes. Walkers. Scooters. Crutches. Turn on the lights when you go into a dark area. Replace any light bulbs as soon as they burn out. Set up your furniture so you have a clear path. Avoid moving your furniture around. If any of your floors are uneven, fix them. If there are any pets around you, be aware of where they are. Review your medicines with your doctor. Some medicines can make you feel dizzy. This can increase your chance of falling. Ask your doctor what other things that you can do to help prevent falls. This information is not intended to replace advice given to you by your health care provider. Make sure you discuss any questions you have with your health care provider. Document Released: 04/24/2009 Document Revised: 12/04/2015 Document Reviewed: 08/02/2014 Elsevier Interactive Patient Education  2017 [employer].

## 2022-03-12 NOTE — Addendum Note (Signed)
Addended by: Coolidge Breeze on: 03/12/2022 10:00 AM   Modules accepted: Orders

## 2022-03-16 ENCOUNTER — Ambulatory Visit (INDEPENDENT_AMBULATORY_CARE_PROVIDER_SITE_OTHER): Payer: Medicare HMO | Admitting: Internal Medicine

## 2022-03-16 VITALS — BP 144/78 | HR 80 | Temp 98.4°F | Ht 63.0 in | Wt 197.0 lb

## 2022-03-16 DIAGNOSIS — R739 Hyperglycemia, unspecified: Secondary | ICD-10-CM

## 2022-03-16 DIAGNOSIS — L989 Disorder of the skin and subcutaneous tissue, unspecified: Secondary | ICD-10-CM | POA: Diagnosis not present

## 2022-03-16 DIAGNOSIS — N183 Chronic kidney disease, stage 3 unspecified: Secondary | ICD-10-CM | POA: Diagnosis not present

## 2022-03-16 DIAGNOSIS — E559 Vitamin D deficiency, unspecified: Secondary | ICD-10-CM

## 2022-03-16 DIAGNOSIS — M779 Enthesopathy, unspecified: Secondary | ICD-10-CM

## 2022-03-16 DIAGNOSIS — E611 Iron deficiency: Secondary | ICD-10-CM | POA: Diagnosis not present

## 2022-03-16 DIAGNOSIS — Z0001 Encounter for general adult medical examination with abnormal findings: Secondary | ICD-10-CM | POA: Diagnosis not present

## 2022-03-16 DIAGNOSIS — M810 Age-related osteoporosis without current pathological fracture: Secondary | ICD-10-CM | POA: Diagnosis not present

## 2022-03-16 DIAGNOSIS — R0600 Dyspnea, unspecified: Secondary | ICD-10-CM | POA: Diagnosis not present

## 2022-03-16 DIAGNOSIS — E7800 Pure hypercholesterolemia, unspecified: Secondary | ICD-10-CM | POA: Diagnosis not present

## 2022-03-16 DIAGNOSIS — E538 Deficiency of other specified B group vitamins: Secondary | ICD-10-CM | POA: Diagnosis not present

## 2022-03-16 DIAGNOSIS — F0390 Unspecified dementia without behavioral disturbance: Secondary | ICD-10-CM

## 2022-03-16 LAB — BASIC METABOLIC PANEL
BUN: 18 mg/dL (ref 6–23)
CO2: 28 mEq/L (ref 19–32)
Calcium: 10.1 mg/dL (ref 8.4–10.5)
Chloride: 105 mEq/L (ref 96–112)
Creatinine, Ser: 1.14 mg/dL (ref 0.40–1.20)
GFR: 43.99 mL/min — ABNORMAL LOW (ref >60.00)
Glucose, Bld: 131 mg/dL — ABNORMAL HIGH (ref 70–99)
Potassium: 3.3 mEq/L — ABNORMAL LOW (ref 3.5–5.1)
Sodium: 141 mEq/L (ref 135–145)

## 2022-03-16 LAB — HEPATIC FUNCTION PANEL
ALT: 15 U/L (ref 0–35)
AST: 23 U/L (ref 0–37)
Albumin: 3.8 g/dL (ref 3.5–5.2)
Alkaline Phosphatase: 116 U/L (ref 39–117)
Bilirubin, Direct: 0.1 mg/dL (ref 0.0–0.3)
Total Bilirubin: 0.6 mg/dL (ref 0.2–1.2)
Total Protein: 7.4 g/dL (ref 6.0–8.3)

## 2022-03-16 LAB — LIPID PANEL
Cholesterol: 141 mg/dL (ref 0–200)
HDL: 44.5 mg/dL (ref >39.00)
LDL Cholesterol: 75 mg/dL (ref 0–99)
NonHDL: 96.73
Total CHOL/HDL Ratio: 3
Triglycerides: 107 mg/dL (ref 0.0–149.0)
VLDL: 21.4 mg/dL (ref 0.0–40.0)

## 2022-03-16 LAB — CBC WITH DIFFERENTIAL/PLATELET
Basophils Absolute: 0 10*3/uL (ref 0.0–0.1)
Basophils Relative: 0.9 % (ref 0.0–3.0)
Eosinophils Absolute: 0.1 10*3/uL (ref 0.0–0.7)
Eosinophils Relative: 1.5 % (ref 0.0–5.0)
HCT: 38.3 % (ref 36.0–46.0)
Hemoglobin: 12.7 g/dL (ref 12.0–15.0)
Lymphocytes Relative: 38.7 % (ref 12.0–46.0)
Lymphs Abs: 1.9 10*3/uL (ref 0.7–4.0)
MCHC: 33.3 g/dL (ref 30.0–36.0)
MCV: 93.2 fl (ref 78.0–100.0)
Monocytes Absolute: 0.4 10*3/uL (ref 0.1–1.0)
Monocytes Relative: 8.8 % (ref 3.0–12.0)
Neutro Abs: 2.5 10*3/uL (ref 1.4–7.7)
Neutrophils Relative %: 50.1 % (ref 43.0–77.0)
Platelets: 234 10*3/uL (ref 150.0–400.0)
RBC: 4.11 Mil/uL (ref 3.87–5.11)
RDW: 13.7 % (ref 11.5–15.5)
WBC: 4.9 10*3/uL (ref 4.0–10.5)

## 2022-03-16 LAB — VITAMIN D 25 HYDROXY (VIT D DEFICIENCY, FRACTURES): VITD: 31.54 ng/mL (ref 30.00–100.00)

## 2022-03-16 LAB — TSH: TSH: 1.21 u[IU]/mL (ref 0.35–5.50)

## 2022-03-16 LAB — IBC PANEL
Iron: 81 ug/dL (ref 42–145)
Saturation Ratios: 28.8 % (ref 20.0–50.0)
TIBC: 281.4 ug/dL (ref 250.0–450.0)
Transferrin: 201 mg/dL — ABNORMAL LOW (ref 212.0–360.0)

## 2022-03-16 LAB — VITAMIN B12: Vitamin B-12: 393 pg/mL (ref 211–911)

## 2022-03-16 LAB — FERRITIN: Ferritin: 63 ng/mL (ref 10.0–291.0)

## 2022-03-16 LAB — HEMOGLOBIN A1C: Hgb A1c MFr Bld: 7 % — ABNORMAL HIGH (ref 4.6–6.5)

## 2022-03-16 MED ORDER — IPRATROPIUM-ALBUTEROL 20-100 MCG/ACT IN AERS: 1.0000 | INHALATION_SPRAY | Freq: Three times a day (TID) | RESPIRATORY_TRACT | 11 refills | Status: DC

## 2022-03-16 MED ORDER — CITALOPRAM HYDROBROMIDE 10 MG PO TABS: ORAL_TABLET | ORAL | 3 refills | Status: DC

## 2022-03-16 MED ORDER — QUETIAPINE FUMARATE 50 MG PO TABS: 50.0000 mg | ORAL_TABLET | Freq: Every day | ORAL | 3 refills | Status: DC

## 2022-03-16 MED ORDER — ALPRAZOLAM 0.25 MG PO TABS: ORAL_TABLET | ORAL | 5 refills | Status: DC

## 2022-03-16 MED ORDER — DENOSUMAB 60 MG/ML ~~LOC~~ SOSY
60.0000 mg | PREFILLED_SYRINGE | Freq: Once | SUBCUTANEOUS | Status: AC
  Administered 2022-03-16: 60 mg via SUBCUTANEOUS

## 2022-03-16 MED ORDER — NAMZARIC 28-10 MG PO CP24: 1.0000 | ORAL_CAPSULE | Freq: Every morning | ORAL | 3 refills | Status: DC

## 2022-03-16 NOTE — Assessment & Plan Note (Signed)
Last vitamin D Lab Results  Component Value Date   VD25OH 34.72 03/23/2021   Low, reminded to start oral replacement

## 2022-03-16 NOTE — Patient Instructions (Addendum)
Please take OTC Vitamin D3 at 2000 units per day, indefinitely  Ok to use the OTC Voltaren gel for the right lower leg tendonitis at the inside knee  Please take all new medication as prescribed - the combivent inhaler  Please continue all other medications as before, and refills have been done if requested.  Please have the pharmacy call with any other refills you may need.  Please continue your efforts at being more active, low cholesterol diet, and weight control.  You are otherwise up to date with prevention measures today.  Please keep your appointments with your specialists as you may have planned  You will be contacted regarding the referral for: Dermatology  Please go to the LAB at the blood drawing area for the tests to be done  You will be contacted by phone if any changes need to be made immediately.  Otherwise, you will receive a letter about your results with an explanation, but please check with MyChart first.  Please remember to sign up for MyChart if you have not done so, as this will be important to you in the future with finding out test results, communicating by private email, and scheduling acute appointments online when needed.  Please make an Appointment to return in 6 months, or sooner if needed

## 2022-03-17 DIAGNOSIS — L989 Disorder of the skin and subcutaneous tissue, unspecified: Secondary | ICD-10-CM | POA: Insufficient documentation

## 2022-03-17 DIAGNOSIS — M779 Enthesopathy, unspecified: Secondary | ICD-10-CM | POA: Insufficient documentation

## 2022-03-17 LAB — URINALYSIS, ROUTINE W REFLEX MICROSCOPIC
Bilirubin Urine: NEGATIVE
Hgb urine dipstick: NEGATIVE
Ketones, ur: NEGATIVE
Leukocytes,Ua: NEGATIVE
Nitrite: NEGATIVE
RBC / HPF: NONE SEEN (ref 0–?)
Specific Gravity, Urine: 1.015 (ref 1.000–1.030)
Total Protein, Urine: NEGATIVE
Urine Glucose: NEGATIVE
Urobilinogen, UA: 0.2 (ref 0.0–1.0)
pH: 7 (ref 5.0–8.0)

## 2022-03-17 NOTE — Assessment & Plan Note (Signed)
Mild to mod, for voltaren gel prn,  to f/u any worsening symptoms or concerns  

## 2022-03-17 NOTE — Progress Notes (Signed)
Patient ID: Teresa Riley, female   DOB: 1935/02/17, 87 y.o.   MRN: [id number]         Chief Complaint:: wellness exam and scalp irritation, Medication Refill, and possible prolia injection  Her with daughter       HPI:  Teresa Riley is a 87 y.o. female here for wellness exam; pt to have shingrix done at the pharmacy, declines tdap, covid booster, flu shot today o/w up to date                        Also has a hard small lesion to right parietal area scalp for several months not healing, just gets bigger and irritated with manipulating it all the tie.  Also has pain mild intermittent sharp to the medial proximal right leg gastroc insertion area at the knee, some better today overall, worse to walk, better to sit.  Not taking Vit D.  Pt denies chest pain, wheezing, orthopnea, PND, increased LE swelling, palpitations, dizziness or syncope, though has mild dyspnea at times even without exertion, none today   Pt denies polydipsia, polyuria, or new focal neuro s/s.    Pt denies fever, wt loss, night sweats, loss of appetite, or other constitutional symptoms  Dementia overall stable symptomatically, and not assoc with behavioral changes such as hallucinations, paranoia, or agitation.     Wt Readings from Last 3 Encounters:  03/16/22 197 lb (89.4 kg)  03/11/22 200 lb (90.7 kg)  07/30/21 194 lb (88 kg)   BP Readings from Last 3 Encounters:  03/16/22 (!) 144/78  07/30/21 (!) 171/85  07/07/21 (!) 142/70   Immunization History  Administered Date(s) Administered   DTaP 07/13/2007   Fluad Quad(high Dose 65+) 04/26/2019, 06/27/2020, 03/23/2021   Influenza Split 06/25/2011   Influenza, High Dose Seasonal PF 05/27/2015, 05/28/2016, 04/28/2017, 05/02/2018   Influenza, Seasonal, Injecte, Preservative Fre 07/11/2012   Influenza,inj,Quad PF,6+ Mos 04/04/2013   Influenza-Unspecified 03/12/2014, 04/11/2018   PFIZER(Purple Top)SARS-COV-2 Vaccination 09/22/2019, 10/16/2019   PPD Test 01/19/2011   Pneumococcal  Conjugate-13 07/12/2001, 10/02/2013   Pneumococcal Polysaccharide-23 10/29/2010   Varicella 02/10/2008   There are no preventive care reminders to display for this patient.     Past Medical History:  Diagnosis Date   Allergic rhinitis 09/05/2014   Allergic rhinitis, cause unspecified 10/29/2010   Chronic constipation 10/29/2010   CKD (chronic kidney disease) stage 3, GFR 30-59 ml/min (HCC) 04/28/2017   Eczema 10/29/2010   Gait difficulty    H/O: hysterectomy 10/29/2010   History of cervical cancer 10/29/2010   HTN (hypertension) 10/29/2010   Hyperlipidemia    Hypertension    Impaired glucose tolerance 08/16/2012   Loss of appetite    Memory loss    Orthostatic hypotension 06/05/2012   Osteoporosis    Right hip pain    Seizures ([place])    Vascular dementia ([place])    Vitamin D deficiency 09/05/2014   Past Surgical History:  Procedure Laterality Date   ABDOMINAL HYSTERECTOMY  1970   BREAST BIOPSY  1980's   benign   CATARACT EXTRACTION W/ INTRAOCULAR LENS  IMPLANT, BILATERAL  01/2011   EYE SURGERY      reports that she quit smoking about 50 years ago. Her smoking use included cigarettes. She has never used smokeless tobacco. She reports that she does not drink alcohol and does not use drugs. family history includes Diabetes in her father; High blood pressure in her daughter; Hypertension in her father and mother.  No Known Allergies Current Outpatient Medications on File Prior to Visit  Medication Sig Dispense Refill   acetaminophen (TYLENOL) 500 MG tablet Take 1,000 mg by mouth every 6 (six) hours as needed for headache or fever (pain).     albuterol (PROVENTIL) (2.5 MG/3ML) 0.083% nebulizer solution Take 3 mLs (2.5 mg total) by nebulization every 6 (six) hours as needed for wheezing or shortness of breath. 150 mL 1   amLODipine (NORVASC) 10 MG tablet TAKE 1 TABLET(10 MG) BY MOUTH DAILY 90 tablet 2   atorvastatin (LIPITOR) 20 MG tablet Take 1 tablet (20 mg total) by mouth daily. 90  tablet 3   guaiFENesin-dextromethorphan (ROBITUSSIN DM) 100-10 MG/5ML syrup Take 10 mLs by mouth every 4 (four) hours as needed for cough. 118 mL 0   labetalol (NORMODYNE) 300 MG tablet TAKE 1 TABLET(300 MG) BY MOUTH TWICE DAILY (Patient taking differently: Take 300 mg by mouth 2 (two) times daily.) 180 tablet 3   lisinopril (ZESTRIL) 40 MG tablet Take 1 tablet (40 mg total) by mouth daily. (Patient taking differently: Take 40 mg by mouth at bedtime.) 90 tablet 1   pantoprazole (PROTONIX) 40 MG tablet Take 1 tablet (40 mg total) by mouth daily at 6 (six) AM. 30 tablet 0   Polyethyl Glycol-Propyl Glycol 0.4-0.3 % SOLN Place 1 drop into both eyes daily as needed (dry eyes).      No current facility-administered medications on file prior to visit.        ROS:  All others reviewed and negative.  Objective        PE:  BP (!) 144/78 (BP Location: Left Arm, Patient Position: Sitting, Cuff Size: Large)   Pulse 80   Temp 98.4 F (36.9 C) (Oral)   Ht 5\' 3"  (1.6 m)   Wt 197 lb (89.4 kg)   SpO2 91%   BMI 34.90 kg/m                 Constitutional: Pt appears in NAD               HENT: Head: NCAT.                Right Ear: External ear normal.                 Left Ear: External ear normal.                Eyes: . Pupils are equal, round, and reactive to light. Conjunctivae and EOM are normal               Nose: without d/c or deformity               Neck: Neck supple. Gross normal ROM               Cardiovascular: Normal rate and regular rhythm.                 Pulmonary/Chest: Effort normal and breath sounds without rales or wheezing.                Abd:  Soft, NT, ND, + BS, no organomegaly               Neurological: Pt is alert. At baseline orientation, motor grossly intact               Skin: Skin is warm. Right scalp area with hard raised horn type lesion approx 5 mm diameter,  LE edema - none;  has mild tenderness to right medial gastroc insertion tendon without swelling and o/w neurovasc  intact               Psychiatric: Pt behavior is normal without agitation   Micro: none  Cardiac tracings I have personally interpreted today:  none  Pertinent Radiological findings (summarize): none   Lab Results  Component Value Date   WBC 4.9 03/16/2022   HGB 12.7 03/16/2022   HCT 38.3 03/16/2022   PLT 234.0 03/16/2022   GLUCOSE 131 (H) 03/16/2022   CHOL 141 03/16/2022   TRIG 107.0 03/16/2022   HDL 44.50 03/16/2022   LDLDIRECT 123.0 03/23/2021   LDLCALC 75 03/16/2022   ALT 15 03/16/2022   AST 23 03/16/2022   NA 141 03/16/2022   K 3.3 (L) 03/16/2022   CL 105 03/16/2022   CREATININE 1.14 03/16/2022   BUN 18 03/16/2022   CO2 28 03/16/2022   TSH 1.21 03/16/2022   HGBA1C 7.0 (H) 03/16/2022   Assessment/Plan:  Teresa Riley is a 87 y.o. Black or African American [2] female with  has a past medical history of Allergic rhinitis (09/05/2014), Allergic rhinitis, cause unspecified (10/29/2010), Chronic constipation (10/29/2010), CKD (chronic kidney disease) stage 3, GFR 30-59 ml/min (HCC) (04/28/2017), Eczema (10/29/2010), Gait difficulty, H/O: hysterectomy (10/29/2010), History of cervical cancer (10/29/2010), HTN (hypertension) (10/29/2010), Hyperlipidemia, Hypertension, Impaired glucose tolerance (08/16/2012), Loss of appetite, Memory loss, Orthostatic hypotension (06/05/2012), Osteoporosis, Right hip pain, Seizures (HCC), Vascular dementia (HCC), and Vitamin D deficiency (09/05/2014).  Vitamin D deficiency Last vitamin D Lab Results  Component Value Date   VD25OH 34.72 03/23/2021   Low, reminded to start oral replacement   Encounter for well adult exam with abnormal findings Age and sex appropriate education and counseling updated with regular exercise and diet Referrals for preventative services - none needed Immunizations addressed - declines all immunizations Smoking counseling  - none needed Evidence for depression or other mood disorder - none significant Most recent labs  reviewed. I have personally reviewed and have noted: 1) the patient's medical and social history 2) The patient's current medications and supplements 3) The patient's height, weight, and BMI have been recorded in the chart   Dementia (HCC) Stable overall, cont to follow , declines aricept for now  CKD (chronic kidney disease) stage 3, GFR 30-59 ml/min (HCC) Lab Results  Component Value Date   CREATININE 1.14 03/16/2022   Stable overall, cont to avoid nephrotoxins   Hyperglycemia Lab Results  Component Value Date   HGBA1C 7.0 (H) 03/16/2022   Uncontrolled, goal a1c < 7, , pt to continue current medical treatment - diet, wt control as decliens metformin   Hyperlipidemia Lab Results  Component Value Date   LDLCALC 75 03/16/2022   Stable, pt to continue current statin liptior 20 mg qd   Osteoporosis aso fro prolia inj today as is due  Dyspnea ? Anxiety related - exam benign, also for cxr  Scalp lesion Also for dermatology referral,  to f/u any worsening symptoms or concerns   Tendonitis Mild to mod, for voltaren gel prn,  to f/u any worsening symptoms or concerns Followup: No follow-ups on file.  Oliver Barre, MD 03/17/2022 9:32 PM [facility]

## 2022-03-17 NOTE — Assessment & Plan Note (Signed)
Stable overall, cont to follow , declines aricept for now

## 2022-03-17 NOTE — Assessment & Plan Note (Signed)
Lab Results  Component Value Date   CREATININE 1.14 03/16/2022   Stable overall, cont to avoid nephrotoxins

## 2022-03-17 NOTE — Assessment & Plan Note (Signed)
Lab Results  Component Value Date   HGBA1C 7.0 (H) 03/16/2022   Uncontrolled, goal a1c < 7, , pt to continue current medical treatment - diet, wt control as decliens metformin

## 2022-03-17 NOTE — Assessment & Plan Note (Signed)
Also for dermatology referral,  to f/u any worsening symptoms or concerns

## 2022-03-17 NOTE — Assessment & Plan Note (Signed)
?   Anxiety related - exam benign, also for cxr

## 2022-03-17 NOTE — Assessment & Plan Note (Signed)
aso fro prolia inj today as is due

## 2022-03-17 NOTE — Assessment & Plan Note (Signed)
Age and sex appropriate education and counseling updated with regular exercise and diet Referrals for preventative services - none needed Immunizations addressed - declines all immunizations Smoking counseling  - none needed Evidence for depression or other mood disorder - none significant Most recent labs reviewed. I have personally reviewed and have noted: 1) the patient's medical and social history 2) The patient's current medications and supplements 3) The patient's height, weight, and BMI have been recorded in the chart  

## 2022-03-17 NOTE — Assessment & Plan Note (Signed)
Lab Results  Component Value Date   LDLCALC 75 03/16/2022   Stable, pt to continue current statin liptior 20 mg qd

## 2022-04-20 ENCOUNTER — Other Ambulatory Visit: Payer: Self-pay | Admitting: Internal Medicine

## 2022-05-04 ENCOUNTER — Other Ambulatory Visit: Payer: Self-pay | Admitting: Internal Medicine

## 2022-05-04 NOTE — Telephone Encounter (Signed)
Please refill as per office routine med refill policy (all routine meds to be refilled for 3 mo or monthly (per pt preference) up to one year from last visit, then month to month grace period for 3 mo, then further med refills will have to be denied) ? ?

## 2022-05-09 NOTE — Telephone Encounter (Signed)
Last Prolia inj 03/16/22 Next Prolia inj due 09/15/22 

## 2022-05-10 ENCOUNTER — Other Ambulatory Visit: Payer: Self-pay | Admitting: Internal Medicine

## 2022-05-10 NOTE — Telephone Encounter (Signed)
Please refill as per office routine med refill policy (all routine meds to be refilled for 3 mo or monthly (per pt preference) up to one year from last visit, then month to month grace period for 3 mo, then further med refills will have to be denied) ? ?

## 2022-08-05 ENCOUNTER — Other Ambulatory Visit: Payer: Self-pay | Admitting: Neurology

## 2022-08-16 ENCOUNTER — Other Ambulatory Visit: Payer: Self-pay | Admitting: Internal Medicine

## 2022-08-16 ENCOUNTER — Other Ambulatory Visit: Payer: Self-pay | Admitting: *Deleted

## 2022-08-16 MED ORDER — LISINOPRIL 40 MG PO TABS: ORAL_TABLET | ORAL | 1 refills | Status: DC

## 2022-08-16 MED ORDER — AMLODIPINE BESYLATE 10 MG PO TABS: 10.0000 mg | ORAL_TABLET | Freq: Every day | ORAL | 1 refills | Status: DC

## 2022-08-16 NOTE — Telephone Encounter (Signed)
Please refill as per office routine med refill policy (all routine meds to be refilled for 3 mo or monthly (per pt preference) up to one year from last visit, then month to month grace period for 3 mo, then further med refills will have to be denied) ? ?

## 2022-08-17 NOTE — Telephone Encounter (Signed)
Prolia VOB initiated via [email or web address]  Last Prolia inj 03/16/22 Next Prolia inj due 09/15/22    

## 2022-09-06 DIAGNOSIS — Z789 Other specified health status: Secondary | ICD-10-CM | POA: Diagnosis not present

## 2022-09-06 DIAGNOSIS — L538 Other specified erythematous conditions: Secondary | ICD-10-CM | POA: Diagnosis not present

## 2022-09-06 DIAGNOSIS — L298 Other pruritus: Secondary | ICD-10-CM | POA: Diagnosis not present

## 2022-09-06 DIAGNOSIS — L218 Other seborrheic dermatitis: Secondary | ICD-10-CM | POA: Diagnosis not present

## 2022-09-06 DIAGNOSIS — L820 Inflamed seborrheic keratosis: Secondary | ICD-10-CM | POA: Diagnosis not present

## 2022-09-07 NOTE — Telephone Encounter (Signed)
Forwarding to Rx Prior Auth Team 

## 2022-09-13 ENCOUNTER — Other Ambulatory Visit (HOSPITAL_COMMUNITY): Payer: Self-pay

## 2022-09-13 NOTE — Telephone Encounter (Signed)
Pharmacy Patient Advocate Encounter   Received notification that prior authorization for Prolia '60mg'$  is required/requested.  Per Test Claim: $4.60 with pharmacy benefit   PA submitted on 09/13/22 to (ins) [employer] via CoverMyMeds Key or (Medicaid) confirmation # [id number] Status is pending

## 2022-09-17 NOTE — Telephone Encounter (Signed)
Pt ready for scheduling for Prolia '60mg'$  on or after : 09/17/22  Out-of-pocket cost due at time of visit: $0  Primary: [employer] Medicare Prolia co-insurance: 0% Admin fee co-insurance: 0%  Secondary: [place] Medicaid Prolia co-insurance:  Admin fee co-insurance:   Medical Benefit Details: Date Benefits were checked: 08/19/22 Deductible: NO/ Coinsurance: 0%/ Admin Fee: 0%  Prior Auth: APPROVED PA# [id number] Expiration Date: 07/12/23   Pharmacy benefit: Copay $4.60 If patient wants fill through the pharmacy benefit please send prescription to: [employer], and include estimated need by date in rx notes. Pharmacy will ship medication directly to the office.  Patient NOT eligible for Prolia Copay Card. Copay Card can make patient's cost as little as $25. Link to apply: [email or web address]  ** This summary of benefits is an estimation of the patient's out-of-pocket cost. Exact cost may very based on individual plan coverage.

## 2022-09-17 NOTE — Telephone Encounter (Signed)
Patient Advocate Encounter  Prior Authorization for Prolia '60mg'$  has been approved.    PA#  [id number] Effective dates: 03/10/22 through 07/12/23

## 2022-11-08 ENCOUNTER — Ambulatory Visit (INDEPENDENT_AMBULATORY_CARE_PROVIDER_SITE_OTHER): Payer: Medicare HMO | Admitting: Internal Medicine

## 2022-11-08 ENCOUNTER — Encounter: Payer: Self-pay | Admitting: Internal Medicine

## 2022-11-08 VITALS — BP 138/84 | HR 70 | Temp 98.0°F | Ht 63.0 in | Wt 202.0 lb

## 2022-11-08 DIAGNOSIS — I10 Essential (primary) hypertension: Secondary | ICD-10-CM | POA: Diagnosis not present

## 2022-11-08 DIAGNOSIS — E559 Vitamin D deficiency, unspecified: Secondary | ICD-10-CM

## 2022-11-08 DIAGNOSIS — E611 Iron deficiency: Secondary | ICD-10-CM

## 2022-11-08 DIAGNOSIS — N1831 Chronic kidney disease, stage 3a: Secondary | ICD-10-CM | POA: Diagnosis not present

## 2022-11-08 DIAGNOSIS — R739 Hyperglycemia, unspecified: Secondary | ICD-10-CM

## 2022-11-08 DIAGNOSIS — J449 Chronic obstructive pulmonary disease, unspecified: Secondary | ICD-10-CM | POA: Diagnosis not present

## 2022-11-08 DIAGNOSIS — F0390 Unspecified dementia without behavioral disturbance: Secondary | ICD-10-CM

## 2022-11-08 DIAGNOSIS — E7800 Pure hypercholesterolemia, unspecified: Secondary | ICD-10-CM | POA: Diagnosis not present

## 2022-11-08 DIAGNOSIS — Z0001 Encounter for general adult medical examination with abnormal findings: Secondary | ICD-10-CM

## 2022-11-08 DIAGNOSIS — E538 Deficiency of other specified B group vitamins: Secondary | ICD-10-CM | POA: Diagnosis not present

## 2022-11-08 MED ORDER — TRELEGY ELLIPTA 100-62.5-25 MCG/ACT IN AEPB: 1.0000 | INHALATION_SPRAY | Freq: Every day | RESPIRATORY_TRACT | 3 refills | Status: DC

## 2022-11-08 MED ORDER — NAMZARIC 28-10 MG PO CP24: 1.0000 | ORAL_CAPSULE | Freq: Every morning | ORAL | 3 refills | Status: DC

## 2022-11-08 MED ORDER — LISINOPRIL 40 MG PO TABS: ORAL_TABLET | ORAL | 3 refills | Status: DC

## 2022-11-08 NOTE — Progress Notes (Signed)
Patient ID: Teresa Riley, female   DOB: 06-Nov-1934, 88 y.o.   MRN: [id number]         Chief Complaint:: wellness exam and Medical Management of Chronic Issues (Follow up )  , daughter request for respite care, dyspnea copd, dm, hn, [id number]       HPI:  Teresa Riley is a 88 y.o. female here for wellness exam; for shingrix and tdap at the pharmacy, o/w up to date                        Also asks for respite care with [facility] - Teresa Riley, Training and development officer, in home as daughter has trip planned for [place].  Pt denies chest pain, increased sob,  wheezing, orthopnea, PND, increased LE swelling, palpitations, dizziness or syncope, but does mild worsening doe walking with Teresa Riley from room to room at home despite albuterol and spiriva   Pt denies polydipsia, polyuria, or new focal neuro s/s.    Pt denies fever, wt loss, night sweats, loss of appetite, or other constitutional symptoms  Denies urinary symptoms such as dysuria, frequency, urgency, flank pain, hematuria or n/v, fever, chills.   Dementia overall stable symptomatically, and not assoc with behavioral changes such as hallucinations, paranoia, or agitation.   Wt Readings from Last 3 Encounters:  11/08/22 202 lb (91.6 kg)  03/16/22 197 lb (89.4 kg)  03/11/22 200 lb (90.7 kg)   BP Readings from Last 3 Encounters:  11/08/22 138/84  03/16/22 (!) 144/78  07/30/21 (!) 171/85   Immunization History  Administered Date(s) Administered   DTaP 07/13/2007   Fluad Quad(high Dose 65+) 04/26/2019, 06/27/2020, 03/23/2021   Influenza Split 06/25/2011   Influenza, High Dose Seasonal PF 05/27/2015, 05/28/2016, 04/28/2017, 05/02/2018   Influenza, Seasonal, Injecte, Preservative Fre 07/11/2012   Influenza,inj,Quad PF,6+ Mos 04/04/2013   Influenza-Unspecified 03/12/2014, 04/11/2018   PFIZER(Purple Top)SARS-COV-2 Vaccination 09/22/2019, 10/16/2019   PPD Test 01/19/2011   Pneumococcal Conjugate-13 07/12/2001, 10/02/2013    Pneumococcal Polysaccharide-23 10/29/2010   Varicella 02/10/2008   Health Maintenance Due  Topic Date Due   Zoster Vaccines- Shingrix (1 of 2) Never done   DTaP/Tdap/Td (2 - Tdap) 07/12/2017      Past Medical History:  Diagnosis Date   Allergic rhinitis 09/05/2014   Allergic rhinitis, cause unspecified 10/29/2010   Chronic constipation 10/29/2010   CKD (chronic kidney disease) stage 3, GFR 30-59 ml/min (HCC) 04/28/2017   Eczema 10/29/2010   Gait difficulty    H/O: hysterectomy 10/29/2010   History of cervical cancer 10/29/2010   HTN (hypertension) 10/29/2010   Hyperlipidemia    Hypertension    Impaired glucose tolerance 08/16/2012   Loss of appetite    Memory loss    Orthostatic hypotension 06/05/2012   Osteoporosis    Right hip pain    Seizures (HCC)    Vascular dementia (HCC)    Vitamin D deficiency 09/05/2014   Past Surgical History:  Procedure Laterality Date   ABDOMINAL HYSTERECTOMY  1970   BREAST BIOPSY  1980's   benign   CATARACT EXTRACTION W/ INTRAOCULAR LENS  IMPLANT, BILATERAL  01/2011   EYE SURGERY      reports that she quit smoking about 51 years ago. Her smoking use included cigarettes. She has never used smokeless tobacco. She reports that she does not drink alcohol and does not use drugs. family history includes Diabetes in her father; High blood pressure in her daughter; Hypertension in  her father and mother. No Known Allergies Current Outpatient Medications on File Prior to Visit  Medication Sig Dispense Refill   acetaminophen (TYLENOL) 500 MG tablet Take 1,000 mg by mouth every 6 (six) hours as needed for headache or fever (pain).     albuterol (PROVENTIL) (2.5 MG/3ML) 0.083% nebulizer solution Take 3 mLs (2.5 mg total) by nebulization every 6 (six) hours as needed for wheezing or shortness of breath. 150 mL 1   ALPRAZolam (XANAX) 0.25 MG tablet TAKE 1 TABLET(0.25 MG) BY MOUTH TWICE DAILY AS NEEDED 60 tablet 5   amLODipine (NORVASC) 10 MG tablet Take 1 tablet  (10 mg total) by mouth daily. 90 tablet 1   citalopram (CELEXA) 10 MG tablet TAKE 1 TABLET(10 MG) BY MOUTH DAILY 90 tablet 3   guaiFENesin-dextromethorphan (ROBITUSSIN DM) 100-10 MG/5ML syrup Take 10 mLs by mouth every 4 (four) hours as needed for cough. 118 mL 0   labetalol (NORMODYNE) 300 MG tablet TAKE 1 TABLET(300 MG) BY MOUTH TWICE DAILY 180 tablet 3   pantoprazole (PROTONIX) 40 MG tablet Take 1 tablet (40 mg total) by mouth daily at 6 (six) AM. 30 tablet 0   Polyethyl Glycol-Propyl Glycol 0.4-0.3 % SOLN Place 1 drop into both eyes daily as needed (dry eyes).      QUEtiapine (SEROQUEL) 50 MG tablet Take 1 tablet (50 mg total) by mouth at bedtime. 90 tablet 3   atorvastatin (LIPITOR) 20 MG tablet Take 1 tablet (20 mg total) by mouth daily. 90 tablet 3   No current facility-administered medications on file prior to visit.        ROS:  All others reviewed and negative.  Objective        PE:  BP 138/84 (BP Location: Right Arm, Patient Position: Sitting, Cuff Size: Normal)   Pulse 70   Temp 98 F (36.7 C) (Oral)   Ht 5\' 3"  (1.6 m)   Wt 202 lb (91.6 kg)   SpO2 95%   BMI 35.78 kg/m                 Constitutional: Pt appears in NAD               HENT: Head: NCAT.                Right Ear: External ear normal.                 Left Ear: External ear normal.                Eyes: . Pupils are equal, round, and reactive to light. Conjunctivae and EOM are normal               Nose: without d/c or deformity               Neck: Neck supple. Gross normal ROM               Cardiovascular: Normal rate and regular rhythm.                 Pulmonary/Chest: Effort normal and breath sounds without rales or wheezing.                Abd:  Soft, NT, ND, + BS, no organomegaly               Neurological: Pt is alert. At baseline orientation, motor grossly intact               Skin:  Skin is warm. No rashes, no other new lesions, LE edema - none               Psychiatric: Pt behavior is normal without  agitation   Micro: none  Cardiac tracings I have personally interpreted today:  none  Pertinent Radiological findings (summarize): none   Lab Results  Component Value Date   WBC 4.9 03/16/2022   HGB 12.7 03/16/2022   HCT 38.3 03/16/2022   PLT 234.0 03/16/2022   GLUCOSE 131 (H) 03/16/2022   CHOL 141 03/16/2022   TRIG 107.0 03/16/2022   HDL 44.50 03/16/2022   LDLDIRECT 123.0 03/23/2021   LDLCALC 75 03/16/2022   ALT 15 03/16/2022   AST 23 03/16/2022   NA 141 03/16/2022   K 3.3 (L) 03/16/2022   CL 105 03/16/2022   CREATININE 1.14 03/16/2022   BUN 18 03/16/2022   CO2 28 03/16/2022   TSH 1.21 03/16/2022   HGBA1C 7.0 (H) 03/16/2022   Assessment/Plan:  JHADE BERKO is a 88 y.o. Black or African American [2] female with  has a past medical history of Allergic rhinitis (09/05/2014), Allergic rhinitis, cause unspecified (10/29/2010), Chronic constipation (10/29/2010), CKD (chronic kidney disease) stage 3, GFR 30-59 ml/min (HCC) (04/28/2017), Eczema (10/29/2010), Gait difficulty, H/O: hysterectomy (10/29/2010), History of cervical cancer (10/29/2010), HTN (hypertension) (10/29/2010), Hyperlipidemia, Hypertension, Impaired glucose tolerance (08/16/2012), Loss of appetite, Memory loss, Orthostatic hypotension (06/05/2012), Osteoporosis, Right hip pain, Seizures (HCC), Vascular dementia (HCC), and Vitamin D deficiency (09/05/2014).  Encounter for well adult exam with abnormal findings Age and sex appropriate education and counseling updated with regular exercise and diet Referrals for preventative services - none needed Immunizations addressed - for shingrix and tdap at pharmacy Smoking counseling  - none needed Evidence for depression or other mood disorder - none significant Most recent labs reviewed. I have personally reviewed and have noted: 1) the patient's medical and social history 2) The patient's current medications and supplements 3) The patient's height, weight, and BMI have been  recorded in the chart   CKD (chronic kidney disease) stage 3, GFR 30-59 ml/min (HCC) Lab Results  Component Value Date   CREATININE 1.14 03/16/2022   Stable overall, cont to avoid nephrotoxins   HTN (hypertension) BP Readings from Last 3 Encounters:  11/08/22 138/84  03/16/22 (!) 144/78  07/30/21 (!) 171/85   Stable, pt to continue medical treatment norvasc 10 qd, normodyne 300 bid, lisinopril 40 qd   Hyperglycemia Lab Results  Component Value Date   HGBA1C 7.0 (H) 03/16/2022   Mild uncontrolled, for f/u A1c, pt to continue current medical treatment   - diet, wt control   Hyperlipidemia Lab Results  Component Value Date   LDLCALC 75 03/16/2022   Uncontrolled, goal ldl < 70, pt to continue current statin lipitor 20 mg qd, for f/u lab   Vitamin D deficiency Last vitamin D Lab Results  Component Value Date   VD25OH 31.54 03/16/2022   Low, to start oral replacement   Dementia (HCC) Pt unable to care for herself, will ask for [facility] with RN, PT, aide and Social worker to assist with respite care  Chronic obstructive pulmonary disease (HCC) Mild worsening, for change spiriva to trelegy 1 d  Followup: Return in about 6 months (around 05/10/2023).  Oliver Barre, MD 11/08/2022 8:55 PM [facility]

## 2022-11-08 NOTE — Assessment & Plan Note (Signed)
Mild worsening, for change spiriva to trelegy 1 d

## 2022-11-08 NOTE — Assessment & Plan Note (Signed)
Age and sex appropriate education and counseling updated with regular exercise and diet Referrals for preventative services - none needed Immunizations addressed - for shingrix and tdap at pharmacy Smoking counseling  - none needed Evidence for depression or other mood disorder - none significant Most recent labs reviewed. I have personally reviewed and have noted: 1) the patient's medical and social history 2) The patient's current medications and supplements 3) The patient's height, weight, and BMI have been recorded in the chart  

## 2022-11-08 NOTE — Assessment & Plan Note (Signed)
Lab Results  Component Value Date   CREATININE 1.14 03/16/2022   Stable overall, cont to avoid nephrotoxins  

## 2022-11-08 NOTE — Assessment & Plan Note (Signed)
Lab Results  Component Value Date   HGBA1C 7.0 (H) 03/16/2022   Mild uncontrolled, for f/u A1c, pt to continue current medical treatment   - diet, wt control

## 2022-11-08 NOTE — Assessment & Plan Note (Signed)
Lab Results  Component Value Date   LDLCALC 75 03/16/2022   Uncontrolled, goal ldl < 70, pt to continue current statin lipitor 20 mg qd, for f/u lab

## 2022-11-08 NOTE — Assessment & Plan Note (Signed)
Last vitamin D Lab Results  Component Value Date   VD25OH 31.54 03/16/2022   Low, to start oral replacement

## 2022-11-08 NOTE — Assessment & Plan Note (Signed)
BP Readings from Last 3 Encounters:  11/08/22 138/84  03/16/22 (!) 144/78  07/30/21 (!) 171/85   Stable, pt to continue medical treatment norvasc 10 qd, normodyne 300 bid, lisinopril 40 qd

## 2022-11-08 NOTE — Patient Instructions (Addendum)
Please have your Shingrix (shingles) shots done at your local pharmacy, and the Tdap tetanus shot  Ok to change the combivent to Trelegy inhaler - 1 per day  Please continue all other medications as before, and refills have been done if requested.  Please have the pharmacy call with any other refills you may need.  Please continue your efforts at being more active, low cholesterol diet, and weight control.  You are otherwise up to date with prevention measures today.  Please keep your appointments with your specialists as you may have planned  You will be contacted regarding the referral for: Home Health with Social Worker to help with the Respite Care request  Please go to the LAB at the blood drawing area for the tests to be done - the [address] [facility] at your convenience  You will be contacted by phone if any changes need to be made immediately.  Otherwise, you will receive a letter about your results with an explanation, but please check with MyChart first.  Please remember to sign up for MyChart if you have not done so, as this will be important to you in the future with finding out test results, communicating by private email, and scheduling acute appointments online when needed.  Please make an Appointment to return in 6 months, or sooner if needed

## 2022-11-08 NOTE — Assessment & Plan Note (Signed)
Pt unable to care for herself, will ask for [facility] with RN, PT, aide and Social worker to assist with respite care

## 2022-12-01 ENCOUNTER — Other Ambulatory Visit: Payer: Self-pay

## 2022-12-01 MED ORDER — ATORVASTATIN CALCIUM 20 MG PO TABS: 20.0000 mg | ORAL_TABLET | Freq: Every day | ORAL | 3 refills | Status: DC

## 2022-12-01 MED ORDER — ALPRAZOLAM 0.25 MG PO TABS: ORAL_TABLET | ORAL | 5 refills | Status: DC

## 2023-04-11 ENCOUNTER — Other Ambulatory Visit: Payer: Self-pay | Admitting: Internal Medicine

## 2023-04-14 ENCOUNTER — Other Ambulatory Visit: Payer: Self-pay | Admitting: Internal Medicine

## 2023-04-15 ENCOUNTER — Ambulatory Visit: Payer: Medicare HMO | Admitting: Internal Medicine

## 2023-04-27 ENCOUNTER — Other Ambulatory Visit: Payer: Self-pay | Admitting: Internal Medicine

## 2023-04-27 ENCOUNTER — Other Ambulatory Visit: Payer: Self-pay

## 2023-05-10 ENCOUNTER — Other Ambulatory Visit: Payer: Self-pay | Admitting: Neurology

## 2023-05-10 NOTE — Telephone Encounter (Signed)
Pt's daughter called stating that the pt barely has any left and is needing the refill soon. She states that Dr. Terrace Arabia originally prescribed this medication for her. Please advise.

## 2023-05-10 NOTE — Telephone Encounter (Signed)
Dr. Terrace Arabia,  Is this something you are willing to take over prescribing? Thanks,  Production assistant, radio

## 2023-05-12 ENCOUNTER — Telehealth: Payer: Self-pay | Admitting: Neurology

## 2023-05-12 NOTE — Telephone Encounter (Signed)
Call to daughter, went straight to voicemail. Left message to call back regarding medication. Patient has not been seen since 2023. Medication is namzaric

## 2023-05-12 NOTE — Telephone Encounter (Signed)
Pt's daughter called wanting to know if the pt will be ok without taking her NAMZARIC 28-10 MG CP24  for several days due to the pharmacy not getting the medication till Monday. Daughter had asked if there were any samples but RN Randa Evens confirmed that we did not. Please advise.

## 2023-05-16 ENCOUNTER — Other Ambulatory Visit: Payer: Self-pay | Admitting: Internal Medicine

## 2023-05-21 ENCOUNTER — Other Ambulatory Visit: Payer: Self-pay | Admitting: Internal Medicine

## 2023-05-23 ENCOUNTER — Telehealth: Payer: Self-pay | Admitting: Internal Medicine

## 2023-05-23 NOTE — Telephone Encounter (Signed)
Pt is requesting a refill on BP Meds called and mentioned that she has two more pills left.  Meds: lisinopril (ZESTRIL) 40 MG tablet  Last Day of visit: 4.29.24 Next Appt: 11.22.24

## 2023-05-23 NOTE — Telephone Encounter (Signed)
Called and left voicemail, letting Pt know a year supply was sent in April she is not due until April 2025. Pt needs to contact pharmacy for refill.

## 2023-05-25 ENCOUNTER — Other Ambulatory Visit: Payer: Self-pay

## 2023-05-25 ENCOUNTER — Other Ambulatory Visit: Payer: Self-pay | Admitting: Internal Medicine

## 2023-05-25 MED ORDER — LISINOPRIL 40 MG PO TABS: ORAL_TABLET | ORAL | 3 refills | Status: DC

## 2023-05-25 NOTE — Telephone Encounter (Signed)
Patient's daughter spoke with the pharmacy and was told they have not received any refills. They would like to know if the medication can be re-sent. Best callback is [phone].

## 2023-05-25 NOTE — Telephone Encounter (Signed)
Refill sent.

## 2023-06-03 ENCOUNTER — Ambulatory Visit: Payer: Medicare HMO | Admitting: Internal Medicine

## 2023-06-14 ENCOUNTER — Encounter (HOSPITAL_BASED_OUTPATIENT_CLINIC_OR_DEPARTMENT_OTHER): Payer: Self-pay | Admitting: *Deleted

## 2023-06-14 ENCOUNTER — Emergency Department (HOSPITAL_BASED_OUTPATIENT_CLINIC_OR_DEPARTMENT_OTHER): Payer: Medicare HMO | Admitting: Radiology

## 2023-06-14 ENCOUNTER — Other Ambulatory Visit: Payer: Self-pay

## 2023-06-14 ENCOUNTER — Emergency Department (HOSPITAL_BASED_OUTPATIENT_CLINIC_OR_DEPARTMENT_OTHER)
Admission: EM | Admit: 2023-06-14 | Discharge: 2023-06-14 | Disposition: A | Discharge status: Home / Self Care | Payer: Medicare HMO | Attending: Emergency Medicine | Admitting: Emergency Medicine

## 2023-06-14 DIAGNOSIS — J441 Chronic obstructive pulmonary disease with (acute) exacerbation: Secondary | ICD-10-CM | POA: Diagnosis not present

## 2023-06-14 DIAGNOSIS — Z20822 Contact with and (suspected) exposure to covid-19: Secondary | ICD-10-CM | POA: Diagnosis not present

## 2023-06-14 DIAGNOSIS — R062 Wheezing: Secondary | ICD-10-CM | POA: Diagnosis not present

## 2023-06-14 DIAGNOSIS — Z79899 Other long term (current) drug therapy: Secondary | ICD-10-CM | POA: Diagnosis not present

## 2023-06-14 DIAGNOSIS — N189 Chronic kidney disease, unspecified: Secondary | ICD-10-CM | POA: Diagnosis not present

## 2023-06-14 DIAGNOSIS — R9389 Abnormal findings on diagnostic imaging of other specified body structures: Secondary | ICD-10-CM | POA: Diagnosis not present

## 2023-06-14 DIAGNOSIS — R0602 Shortness of breath: Secondary | ICD-10-CM | POA: Diagnosis not present

## 2023-06-14 DIAGNOSIS — I129 Hypertensive chronic kidney disease with stage 1 through stage 4 chronic kidney disease, or unspecified chronic kidney disease: Secondary | ICD-10-CM | POA: Insufficient documentation

## 2023-06-14 LAB — CBC
HCT: 43.3 % (ref 36.0–46.0)
Hemoglobin: 14.1 g/dL (ref 12.0–15.0)
MCH: 30.3 pg (ref 26.0–34.0)
MCHC: 32.6 g/dL (ref 30.0–36.0)
MCV: 93.1 fL (ref 80.0–100.0)
Platelets: 261 10*3/uL (ref 150–400)
RBC: 4.65 MIL/uL (ref 3.87–5.11)
RDW: 13 % (ref 11.5–15.5)
WBC: 5.2 10*3/uL (ref 4.0–10.5)
nRBC: 0 % (ref 0.0–0.2)

## 2023-06-14 LAB — COMPREHENSIVE METABOLIC PANEL
ALT: 17 U/L (ref 0–44)
AST: 21 U/L (ref 15–41)
Albumin: 4.3 g/dL (ref 3.5–5.0)
Alkaline Phosphatase: 110 U/L (ref 38–126)
Anion gap: 8 (ref 5–15)
BUN: 14 mg/dL (ref 8–23)
CO2: 28 mmol/L (ref 22–32)
Calcium: 9.6 mg/dL (ref 8.9–10.3)
Chloride: 103 mmol/L (ref 98–111)
Creatinine, Ser: 1.05 mg/dL — ABNORMAL HIGH (ref 0.44–1.00)
GFR, Estimated: 52 mL/min — ABNORMAL LOW (ref >60)
Glucose, Bld: 181 mg/dL — ABNORMAL HIGH (ref 70–99)
Potassium: 3.4 mmol/L — ABNORMAL LOW (ref 3.5–5.1)
Sodium: 139 mmol/L (ref 135–145)
Total Bilirubin: 0.7 mg/dL (ref <1.2)
Total Protein: 8.1 g/dL (ref 6.5–8.1)

## 2023-06-14 LAB — RESP PANEL BY RT-PCR (RSV, FLU A&B, COVID)  RVPGX2
Influenza A by PCR: NEGATIVE
Influenza B by PCR: NEGATIVE
Resp Syncytial Virus by PCR: NEGATIVE
SARS Coronavirus 2 by RT PCR: NEGATIVE

## 2023-06-14 LAB — TROPONIN I (HIGH SENSITIVITY)
Troponin I (High Sensitivity): 9 ng/L (ref <18)
Troponin I (High Sensitivity): 10 ng/L (ref <18)

## 2023-06-14 LAB — BRAIN NATRIURETIC PEPTIDE: B Natriuretic Peptide: 43.3 pg/mL (ref 0.0–100.0)

## 2023-06-14 MED ORDER — IPRATROPIUM-ALBUTEROL 0.5-2.5 (3) MG/3ML IN SOLN
3.0000 mL | Freq: Once | RESPIRATORY_TRACT | Status: AC
  Administered 2023-06-14: 3 mL via RESPIRATORY_TRACT

## 2023-06-14 MED ORDER — IPRATROPIUM-ALBUTEROL 0.5-2.5 (3) MG/3ML IN SOLN
3.0000 mL | Freq: Once | RESPIRATORY_TRACT | Status: AC
  Administered 2023-06-14: 3 mL via RESPIRATORY_TRACT
  Filled 2023-06-14: qty 3

## 2023-06-14 MED ORDER — MAGNESIUM SULFATE 50 % IJ SOLN
2.0000 g | Freq: Once | INTRAMUSCULAR | Status: AC
  Administered 2023-06-14: 2 g via INTRAVENOUS
  Filled 2023-06-14: qty 4

## 2023-06-14 MED ORDER — ALBUTEROL SULFATE HFA 108 (90 BASE) MCG/ACT IN AERS
2.0000 | INHALATION_SPRAY | RESPIRATORY_TRACT | Status: DC | PRN
  Administered 2023-06-14: 2 via RESPIRATORY_TRACT
  Filled 2023-06-14: qty 6.7

## 2023-06-14 MED ORDER — PREDNISONE 10 MG PO TABS
ORAL_TABLET | ORAL | 0 refills | Status: AC
Start: 2023-06-14 — End: 2023-06-21

## 2023-06-14 MED ORDER — METHYLPREDNISOLONE SODIUM SUCC 125 MG IJ SOLR
125.0000 mg | Freq: Once | INTRAMUSCULAR | Status: AC
  Administered 2023-06-14: 125 mg via INTRAVENOUS
  Filled 2023-06-14: qty 2

## 2023-06-14 NOTE — ED Notes (Signed)
RT Note: Patient and daughter was educated on the use of an Albuterol inhaler and spacer with mask.

## 2023-06-14 NOTE — Discharge Instructions (Addendum)
Your lab work is reassuring today.  Your blood counts were all normal.  The heart test (troponin) is normal today (this would be elevated in a heart attack). Your EKG which is the measure of your heart's electrical activity is normal today.  Your BNP, a lab that can indicate heart failure, was normal. Your chest x-ray did not show any abnormalities today.  Your COVID, flu, RSV are negative today.  You had a slightly low potassium at 3.4 (normal between 3.5-5.1).  Please continue to eat potassium rich foods at home such as bananas, potatoes, avocados.  Please take the prednisone taper as prescribed. Please monitor your blood sugars if you have a history of high blood sugars as this medication can cause your blood sugars to increase. Take this medication with breakfast as it can keep you up at night if taken later in the day.  Continue to take your Trelegy inhaler at home as prescribed by your PCP.    Please follow-up with your PCP in about 1 week for a recheck of your symptoms and to discuss blood pressure management as your blood pressures were high today.  Return to the ER for any worsening shortness of breath, chest pain, dizziness, any other new or concerning symptoms.

## 2023-06-14 NOTE — ED Triage Notes (Signed)
Pt arrives POV from home, accompanied by her daughter.  Pt has had some wheezing for 2-3 days and is here for evaluation of sob.

## 2023-06-14 NOTE — ED Provider Notes (Signed)
[place] EMERGENCY DEPARTMENT AT [facility] Provider Note   CSN: [id number] Arrival date & time: 06/14/23  1437     History  Chief Complaint  Patient presents with   Shortness of Breath    Teresa Riley is a 88 y.o. female with history of COPD listed in her chart, hypertension, hyperlipidemia, CKD, who is brought in by her daughter and caretaker at bedside due to concern for wheezing heard over the past 2 to 3 days.  She reports some associated shortness of breath.  Denies any chest pain.  Has been taking her albuterol -Ipratropium inhaler 3 times a day over the past 2 days which has not significantly improved her wheezing.  Denies any cough, congestion, fever or chills at home.  Denies any lower extremity swelling.  She was not aware of the COPD diagnosis in her chart.   Shortness of Breath      Home Medications Prior to Admission medications   Medication Sig Start Date End Date Taking? Authorizing Provider  predniSONE (DELTASONE) 10 MG tablet Take 4 tablets (40 mg total) by mouth daily with breakfast for 2 days, THEN 3 tablets (30 mg total) daily with breakfast for 2 days, THEN 2 tablets (20 mg total) daily with breakfast for 2 days, THEN 1 tablet (10 mg total) daily with breakfast for 1 day. 06/14/23 06/21/23 Yes Arabella Merles, PA-C  acetaminophen (TYLENOL) 500 MG tablet Take 1,000 mg by mouth every 6 (six) hours as needed for headache or fever (pain).    [provider]  albuterol (PROVENTIL) (2.5 MG/3ML) 0.083% nebulizer solution Take 3 mLs (2.5 mg total) by nebulization every 6 (six) hours as needed for wheezing or shortness of breath. 03/29/21   Corwin Levins, MD  ALPRAZolam Prudy Feeler) 0.25 MG tablet TAKE 1 TABLET(0.25 MG) BY MOUTH TWICE DAILY AS NEEDED 12/01/22   Corwin Levins, MD  amLODipine (NORVASC) 10 MG tablet Take 1 tablet (10 mg total) by mouth daily. 08/16/22   Corwin Levins, MD  atorvastatin (LIPITOR) 20 MG tablet Take 1 tablet (20 mg total) by mouth  daily. 12/01/22 12/01/23  Corwin Levins, MD  citalopram (CELEXA) 10 MG tablet TAKE 1 TABLET(10 MG) BY MOUTH DAILY 04/27/23   Corwin Levins, MD  Fluticasone-Umeclidin-Vilant (TRELEGY ELLIPTA) 100-62.5-25 MCG/ACT AEPB Inhale 1 puff into the lungs daily. 11/08/22   Corwin Levins, MD  guaiFENesin-dextromethorphan (ROBITUSSIN DM) 100-10 MG/5ML syrup Take 10 mLs by mouth every 4 (four) hours as needed for cough. 05/29/21   Kathlen Mody, MD  labetalol (NORMODYNE) 300 MG tablet TAKE 1 TABLET(300 MG) BY MOUTH TWICE DAILY 05/05/22   Corwin Levins, MD  lisinopril (ZESTRIL) 40 MG tablet TAKE 1 TABLET(40 MG) BY MOUTH DAILY 05/25/23   Corwin Levins, MD  [facility] 28-10 MG CP24 TAKE 1 CAPSULE BY MOUTH EVERY MORNING 05/10/23   Levert Feinstein, MD  pantoprazole (PROTONIX) 40 MG tablet Take 1 tablet (40 mg total) by mouth daily at 6 (six) AM. 05/30/21   Kathlen Mody, MD  Polyethyl Glycol-Propyl Glycol 0.4-0.3 % SOLN Place 1 drop into both eyes daily as needed (dry eyes).     [provider]  QUEtiapine (SEROQUEL) 50 MG tablet TAKE 1 TABLET(50 MG) BY MOUTH AT BEDTIME 04/12/23   Corwin Levins, MD      Allergies    Patient has no known allergies.    Review of Systems   Review of Systems  Respiratory:  Positive for shortness of breath.  Physical Exam Updated Vital Signs BP (!) 183/92   Pulse 75   Temp 99.1 F (37.3 C)   Resp 20   SpO2 96%  Physical Exam Vitals and nursing note reviewed.  Constitutional:      General: She is not in acute distress.    Appearance: She is well-developed.  HENT:     Head: Normocephalic and atraumatic.  Eyes:     Conjunctiva/sclera: Conjunctivae normal.  Cardiovascular:     Rate and Rhythm: Normal rate and regular rhythm.     Heart sounds: No murmur heard.    Comments: Dorsalis pedis pulses 2+ bilaterally Radial pulses 2+ bilaterally Pulmonary:     Effort: Pulmonary effort is normal. No respiratory distress.     Breath sounds: Normal breath sounds.     Comments:  Mild wheezing in all lung fields Breathing comfortably on room air, talking in shorter sentences  Abdominal:     Palpations: Abdomen is soft.     Tenderness: There is no abdominal tenderness.  Musculoskeletal:        General: No swelling.     Cervical back: Neck supple.     Comments: Mild non-pitting edema and dry skin of the lower extremities bilaterally No tenderness to palpation of the calves bilaterally  Skin:    General: Skin is warm and dry.     Capillary Refill: Capillary refill takes less than 2 seconds.  Neurological:     Mental Status: She is alert.  Psychiatric:        Mood and Affect: Mood normal.     ED Results / Procedures / Treatments   Labs (all labs ordered are listed, but only abnormal results are displayed) Labs Reviewed  COMPREHENSIVE METABOLIC PANEL - Abnormal; Notable for the following components:      Result Value   Potassium 3.4 (*)    Glucose, Bld 181 (*)    Creatinine, Ser 1.05 (*)    GFR, Estimated 52 (*)    All other components within normal limits  RESP PANEL BY RT-PCR (RSV, FLU A&B, COVID)  RVPGX2  CBC  BRAIN NATRIURETIC PEPTIDE  TROPONIN I (HIGH SENSITIVITY)  TROPONIN I (HIGH SENSITIVITY)    EKG EKG Interpretation Date/Time:  Tuesday June 14 2023 14:46:02 EST Ventricular Rate:  78 PR Interval:  192 QRS Duration:  82 QT Interval:  410 QTC Calculation: 467 R Axis:   -7  Text Interpretation: Normal sinus rhythm Low voltage QRS Cannot rule out Anterior infarct , age undetermined Abnormal ECG When compared with ECG of 25-May-2021 10:48, PREVIOUS ECG IS PRESENT when compared to prior, similar appearing  no STEMI Confirmed by Theda Belfast ([id number]) on 06/14/2023 4:04:53 PM  Radiology DG Chest 2 View  Result Date: 06/14/2023 CLINICAL DATA:  Wheezing.  Shortness of breath. EXAM: CHEST - 2 VIEW COMPARISON:  05/25/2021. FINDINGS: Bilateral lung fields are clear. Note is made of elevated right hemidiaphragm. Bilateral costophrenic angles are  clear. Stable cardio-mediastinal silhouette. No acute osseous abnormalities. The soft tissues are within normal limits. IMPRESSION: *No active cardiopulmonary disease. Electronically Signed   By: Jules Schick M.D.   On: 06/14/2023 16:10    Procedures Procedures    Medications Ordered in ED Medications  albuterol (VENTOLIN HFA) 108 (90 Base) MCG/ACT inhaler 2 puff (2 puffs Inhalation Provided for home use 06/14/23 1620)  ipratropium-albuterol (DUONEB) 0.5-2.5 (3) MG/3ML nebulizer solution 3 mL (3 mLs Nebulization Given 06/14/23 1528)  methylPREDNISolone sodium succinate (SOLU-MEDROL) 125 mg/2 mL injection 125 mg (125 mg Intravenous  Given 06/14/23 1541)  ipratropium-albuterol (DUONEB) 0.5-2.5 (3) MG/3ML nebulizer solution 3 mL (3 mLs Nebulization Given 06/14/23 1614)  magnesium sulfate (IV Push/IM) injection 2 g (2 g Intravenous Given 06/14/23 1613)    ED Course/ Medical Decision Making/ A&P                                 Medical Decision Making Amount and/or Complexity of Data Reviewed Labs: ordered. Radiology: ordered.  Risk Prescription drug management.     Differential diagnosis includes but is not limited to COVID, flu, RSV, viral URI, strep pharyngitis, viral pharyngitis, allergic rhinitis, pneumonia, bronchitis, ACS, DVT, PE   ED Course:  Patient well-appearing, no acute distress.  She is satting at 100% on room air, normal respiratory rate.  She did have some wheezing in all lung fields upon initial evaluation.  Reports some shortness of breath, but was able to hold a short conversation with me.  She was given a DuoNeb inhaler and Solu-Medrol.  After reevaluation, states she is feeling much better after this treatment.  I still hear some wheezing in her lung fields, will proceed with another DuoNeb treatment and magnesium for possible COPD exacerbation.  COPD is listed in her medical history, but she was unaware of this diagnosis.  However, it seems her primary care provider  has noted this in previous notes and has prescribed Trelegy inhaler for her use at home.  Low concern for CHF at this time given BNP within normal limits and very minimal nonpitting lower extremity edema bilaterally, chest x-ray without any pulmonary edema.  No chest pain, EKG normal sinus rhythm, initial troponin of 9 and repeat of 10, low concern for ACS at this time. CMP and CBC unremarkable.  Flu, COVID, RSV negative. Chest x-ray without any abnormalities.Suspect COPD exacerbation.  Upon reevaluation, patient with improved lung sounds.  Feels less short of breath and is talking in full sentences with me.  Still satting 100% on room air.  Appropriate for discharge home at this time.  Instructed on how to take prednisone taper at home.  Instructed to use home trilogy inhaler as prescribed.  Instructed to follow-up with PCP in 1 week for recheck of symptoms and for further management of her high blood pressures. Return precautions given.   Impression: COPD exacerbation   Lab Tests: I Ordered, and personally interpreted labs.  The pertinent results include:   CBC, CMP unremarkable Initial troponin of 9, repeat of 10. BNP within normal limits Flu, COVID, RSV negative  Imaging Studies ordered: I ordered imaging studies including chest x ray  I independently visualized the imaging with scope of interpretation limited to determining acute life threatening conditions related to emergency care. Imaging showed no acute abnormalities I agree with the radiologist interpretation   Cardiac Monitoring: / EKG: The patient was maintained on a cardiac monitor.  I personally viewed and interpreted the cardiac monitored which showed an underlying rhythm of: Normal sinus rhythm    External records from outside source obtained and reviewed including primary care note from 11/08/2022 which listed COPD as a medical problem, she was changed from Spiriva to Trelegy inhaler              Final Clinical  Impression(s) / ED Diagnoses Final diagnoses:  COPD with acute exacerbation (HCC)    Rx / DC Orders ED Discharge Orders          Ordered  predniSONE (DELTASONE) 10 MG tablet  Q breakfast        06/14/23 1738              Arabella Merles, PA-C 06/14/23 1820    Tegeler, Canary Brim, MD 06/14/23 [phone]

## 2023-06-15 ENCOUNTER — Telehealth: Payer: Self-pay

## 2023-06-15 ENCOUNTER — Ambulatory Visit: Payer: Medicare HMO | Admitting: Internal Medicine

## 2023-06-15 NOTE — Transitions of Care (Post Inpatient/ED Visit) (Signed)
   06/15/2023  Name: Teresa Riley MRN: [id number] DOB: [birth year]  Today's TOC FU Call Status: Today's TOC FU Call Status:: Unsuccessful Call (1st Attempt) Unsuccessful Call (1st Attempt) Date: 06/15/23  Attempted to reach the patient regarding the most recent Inpatient/ED visit.  Follow Up Plan: Additional outreach attempts will be made to reach the patient to complete the Transitions of Care (Post Inpatient/ED visit) call.   Signature Karena Addison, LPN [facility] Nurse Health Advisor Direct Dial [phone]

## 2023-06-21 ENCOUNTER — Ambulatory Visit: Payer: Medicare HMO | Admitting: Internal Medicine

## 2023-06-21 NOTE — Transitions of Care (Post Inpatient/ED Visit) (Signed)
06/21/2023  Name: Teresa Riley MRN: [id number] DOB: [birth year]  Today's TOC FU Call Status: Today's TOC FU Call Status:: Successful TOC FU Call Completed Unsuccessful Call (1st Attempt) Date: 06/15/23 [facility] FU Call Complete Date: 06/21/23 Patient's Name and Date of Birth confirmed.  Transition Care Management Follow-up Telephone Call Date of Discharge: 06/14/23 Discharge Facility: [facility] ([facility]) Type of Discharge: Emergency Department Reason for ED Visit: Other: (COPD) How have you been since you were released from the hospital?: Better Any questions or concerns?: No  Items Reviewed: Did you receive and understand the discharge instructions provided?: Yes Medications obtained,verified, and reconciled?: Yes (Medications Reviewed) Any new allergies since your discharge?: No Dietary orders reviewed?: Yes Do you have support at home?: Yes People in Home: child(ren), adult  Medications Reviewed Today: Medications Reviewed Today     Reviewed by Karena Addison, LPN (Licensed Practical Nurse) on 06/21/23 at 1136  Med List Status: <None>   Medication Order Taking? Sig Documenting Provider Last Dose Status Informant  acetaminophen (TYLENOL) 500 MG tablet [id number] No Take 1,000 mg by mouth every 6 (six) hours as needed for headache or fever (pain). [provider] Taking Active Child  albuterol (PROVENTIL) (2.5 MG/3ML) 0.083% nebulizer solution [id number] No Take 3 mLs (2.5 mg total) by nebulization every 6 (six) hours as needed for wheezing or shortness of breath. Corwin Levins, MD Taking Active Multiple Informants           Med Note Christie Beckers May 25, 2021  2:48 PM) Per daughter pt does not have a nebulizer at home  ALPRAZolam Prudy Feeler) 0.25 MG tablet [id number]  TAKE 1 TABLET(0.25 MG) BY MOUTH TWICE DAILY AS NEEDED Corwin Levins, MD  Active   amLODipine (NORVASC) 10 MG tablet [id number] No Take 1 tablet (10 mg total) by mouth daily. Corwin Levins, MD  Taking Active   atorvastatin (LIPITOR) 20 MG tablet [id number]  Take 1 tablet (20 mg total) by mouth daily. Corwin Levins, MD  Active   citalopram (CELEXA) 10 MG tablet [id number]  TAKE 1 TABLET(10 MG) BY MOUTH DAILY Corwin Levins, MD  Active   Fluticasone-Umeclidin-Vilant (TRELEGY ELLIPTA) 100-62.5-25 MCG/ACT AEPB [id number]  Inhale 1 puff into the lungs daily. Corwin Levins, MD  Active   guaiFENesin-dextromethorphan [facility] DM) 100-10 MG/5ML syrup [id number] No Take 10 mLs by mouth every 4 (four) hours as needed for cough. Kathlen Mody, MD Taking Active   labetalol (NORMODYNE) 300 MG tablet [id number] No TAKE 1 TABLET(300 MG) BY MOUTH TWICE DAILY Corwin Levins, MD Taking Active   lisinopril (ZESTRIL) 40 MG tablet [id number]  TAKE 1 TABLET(40 MG) BY MOUTH DAILY Corwin Levins, MD  Active   [facility] 28-10 MG CP24 [id number]  TAKE 1 CAPSULE BY MOUTH EVERY MORNING Levert Feinstein, MD  Active   pantoprazole (PROTONIX) 40 MG tablet [id number] No Take 1 tablet (40 mg total) by mouth daily at 6 (six) AM. Kathlen Mody, MD Taking Active   Polyethyl Glycol-Propyl Glycol 0.4-0.3 % SOLN [id number] No Place 1 drop into both eyes daily as needed (dry eyes).  [provider] Taking Active Multiple Informants  predniSONE (DELTASONE) 10 MG tablet [id number]  Take 4 tablets (40 mg total) by mouth daily with breakfast for 2 days, THEN 3 tablets (30 mg total) daily with breakfast for 2 days, THEN 2 tablets (20 mg total) daily with breakfast for 2 days, THEN 1 tablet (10 mg total) daily with breakfast for 1 day. Truddie Hidden,  Marchelle Folks, PA-C  Active   QUEtiapine (SEROQUEL) 50 MG tablet [id number]  TAKE 1 TABLET(50 MG) BY MOUTH AT BEDTIME Corwin Levins, MD  Active             Home Care and Equipment/Supplies: Were Home Health Services Ordered?: NA Any new equipment or medical supplies ordered?: NA  Functional Questionnaire: Do you need assistance with bathing/showering or dressing?: No Do you need assistance with meal  preparation?: No Do you need assistance with eating?: No Do you have difficulty maintaining continence: No Do you need assistance with getting out of bed/getting out of a chair/moving?: No Do you have difficulty managing or taking your medications?: No  Follow up appointments reviewed: PCP Follow-up appointment confirmed?: No (no avail appt, sent message to staff to schedule) Specialist Hospital Follow-up appointment confirmed?: NA Do you need transportation to your follow-up appointment?: No Do you understand care options if your condition(s) worsen?: Yes-patient verbalized understanding    SIGNATURE Karena Addison, LPN [facility] Nurse Health Advisor Direct Dial [phone]

## 2023-07-28 ENCOUNTER — Other Ambulatory Visit: Payer: Self-pay | Admitting: Internal Medicine

## 2023-07-28 ENCOUNTER — Other Ambulatory Visit: Payer: Self-pay

## 2023-08-19 ENCOUNTER — Ambulatory Visit (INDEPENDENT_AMBULATORY_CARE_PROVIDER_SITE_OTHER): Payer: Medicare HMO

## 2023-08-19 DIAGNOSIS — Z0000 Encounter for general adult medical examination without abnormal findings: Secondary | ICD-10-CM

## 2023-08-19 NOTE — Progress Notes (Signed)
 Subjective:   Teresa Riley is a 89 y.o. female who presents for Medicare Annual (Subsequent) preventive examination.  Visit Complete: Virtual I connected with  Teresa Riley on 08/19/23 by a audio enabled telemedicine application and verified that I am speaking with the correct person using two identifiers. Daughter also on call. Interactive audio and video telecommunications were attempted between this provider and patient, however failed, due to patient having technical difficulties OR patient did not have access to video capability.  We continued and completed visit with audio only.   Patient Location: Home  Provider Location: Home Office  I discussed the limitations of evaluation and management by telemedicine. The patient expressed understanding and agreed to proceed.  Vital Signs: Because this visit was a virtual/telehealth visit, some criteria may be missing or patient reported. Any vitals not documented were not able to be obtained and vitals that have been documented are patient reported.    Cardiac Risk Factors include: advanced age (>29men, >85 women);dyslipidemia;hypertension     Objective:    Today's Vitals   There is no height or weight on file to calculate BMI.     08/19/2023    2:38 PM 06/14/2023    2:49 PM 03/11/2022    2:26 PM 05/25/2021   10:28 AM 02/24/2021   11:19 AM 06/23/2014   11:34 AM 05/08/2014    1:14 PM  Advanced Directives  Does Patient Have a Medical Advance Directive? Yes No Yes No No No No  Type of Estate Agent of [employer] Power of [place];Living will      Copy of Healthcare Power of Attorney in Chart? No - copy requested  No - copy requested      Would patient like information on creating a medical advance directive?    No - Patient declined No - Patient declined  No - patient declined information    Current Medications (verified) Outpatient Encounter Medications as of 08/19/2023  Medication Sig    ALPRAZolam  (XANAX ) 0.25 MG tablet TAKE 1 TABLET(0.25 MG) BY MOUTH TWICE DAILY AS NEEDED   amLODipine  (NORVASC ) 10 MG tablet TAKE 1 TABLET(10 MG) BY MOUTH DAILY   atorvastatin  (LIPITOR) 20 MG tablet Take 1 tablet (20 mg total) by mouth daily.   citalopram  (CELEXA ) 10 MG tablet TAKE 1 TABLET(10 MG) BY MOUTH DAILY   labetalol  (NORMODYNE ) 300 MG tablet TAKE 1 TABLET(300 MG) BY MOUTH TWICE DAILY   lisinopril  (ZESTRIL ) 40 MG tablet TAKE 1 TABLET(40 MG) BY MOUTH DAILY   NAMZARIC  28-10 MG CP24 TAKE 1 CAPSULE BY MOUTH EVERY MORNING   Polyethyl Glycol-Propyl Glycol 0.4-0.3 % SOLN Place 1 drop into both eyes daily as needed (dry eyes).    QUEtiapine  (SEROQUEL ) 50 MG tablet TAKE 1 TABLET(50 MG) BY MOUTH AT BEDTIME   acetaminophen  (TYLENOL ) 500 MG tablet Take 1,000 mg by mouth every 6 (six) hours as needed for headache or fever (pain). (Patient not taking: Reported on 08/19/2023)   albuterol  (PROVENTIL ) (2.5 MG/3ML) 0.083% nebulizer solution Take 3 mLs (2.5 mg total) by nebulization every 6 (six) hours as needed for wheezing or shortness of breath. (Patient not taking: Reported on 08/19/2023)   Fluticasone -Umeclidin-Vilant (TRELEGY ELLIPTA ) 100-62.5-25 MCG/ACT AEPB Inhale 1 puff into the lungs daily. (Patient not taking: Reported on 08/19/2023)   guaiFENesin -dextromethorphan  (ROBITUSSIN DM) 100-10 MG/5ML syrup Take 10 mLs by mouth every 4 (four) hours as needed for cough. (Patient not taking: Reported on 08/19/2023)   pantoprazole  (PROTONIX ) 40 MG tablet Take 1  tablet (40 mg total) by mouth daily at 6 (six) AM. (Patient not taking: Reported on 08/19/2023)   No facility-administered encounter medications on file as of 08/19/2023.    Allergies (verified) Patient has no known allergies.   History: Past Medical History:  Diagnosis Date   Allergic rhinitis 09/05/2014   Allergic rhinitis, cause unspecified 10/29/2010   Chronic constipation 10/29/2010   CKD (chronic kidney disease) stage 3, GFR 30-59 ml/min (HCC) 04/28/2017    Eczema 10/29/2010   Gait difficulty    H/O: hysterectomy 10/29/2010   History of cervical cancer 10/29/2010   HTN (hypertension) 10/29/2010   Hyperlipidemia    Hypertension    Impaired glucose tolerance 08/16/2012   Loss of appetite    Memory loss    Orthostatic hypotension 06/05/2012   Osteoporosis    Right hip pain    Seizures (HCC)    Vascular dementia (HCC)    Vitamin D  deficiency 09/05/2014   Past Surgical History:  Procedure Laterality Date   ABDOMINAL HYSTERECTOMY  1970   BREAST BIOPSY  1980's   benign   CATARACT EXTRACTION W/ INTRAOCULAR LENS  IMPLANT, BILATERAL  01/2011   EYE SURGERY     Family History  Problem Relation Age of Onset   Hypertension Mother    Hypertension Father    Diabetes Father    High blood pressure Daughter        x2   Colon cancer Neg Hx    Colon polyps Neg Hx    Kidney disease Neg Hx    Gallbladder disease Neg Hx    Social History   Socioeconomic History   Marital status: Divorced    Spouse name: Not on file   Number of children: 3   Years of education: 2   Highest education level: Not on file  Occupational History   Occupation: retired runner, broadcasting/film/video  Tobacco Use   Smoking status: Former    Current packs/day: 0.00    Types: Cigarettes    Quit date: 07/13/1971    Years since quitting: 52.1   Smokeless tobacco: Never   Tobacco comments:    quit in 1973  Vaping Use   Vaping status: Never Used  Substance and Sexual Activity   Alcohol  use: No    Alcohol /week: 0.0 standard drinks of alcohol    Drug use: No   Sexual activity: Not Currently  Other Topics Concern   Not on file  Social History Narrative   Patient lives at home with two daughters Teresa Riley) Teresa Riley). Patient is retired. Patient has two years college.   Social Drivers of Corporate Investment Banker Strain: Low Risk  (08/19/2023)   Overall Financial Resource Strain (CARDIA)    Difficulty of Paying Living Expenses: Not hard at all  Food Insecurity: No Food  Insecurity (08/19/2023)   Hunger Vital Sign    Worried About Running Out of Food in the Last Year: Never true    Ran Out of Food in the Last Year: Never true  Transportation Needs: No Transportation Needs (08/19/2023)   PRAPARE - Administrator, Civil Service (Medical): No    Lack of Transportation (Non-Medical): No  Physical Activity: Inactive (08/19/2023)   Exercise Vital Sign    Days of Exercise per Week: 0 days    Minutes of Exercise per Session: 0 min  Stress: No Stress Concern Present (08/19/2023)   [employer] of Occupational Health - Occupational Stress Questionnaire    Feeling of Stress : Not at all  Social Connections: Unknown (08/19/2023)   Social Connection and Isolation Panel [NHANES]    Frequency of Communication with Friends and Family: Once a week    Frequency of Social Gatherings with Friends and Family: Not on file    Attends Religious Services: Never    Database Administrator or Organizations: Not on file    Attends Banker Meetings: Never    Marital Status: Divorced    Tobacco Counseling Counseling given: Not Answered Tobacco comments: quit in 1973   Clinical Intake:  Pre-visit preparation completed: Yes  Pain : No/denies pain     Nutritional Risks: None Diabetes: No  How often do you need to have someone help you when you read instructions, pamphlets, or other written materials from your doctor or pharmacy?: 3 - Sometimes  Interpreter Needed?: No  Information entered by :: NAllen LPN   Activities of Daily Living    08/19/2023    2:28 PM  In your present state of health, do you have any difficulty performing the following activities:  Hearing? 0  Vision? 0  Difficulty concentrating or making decisions? 1  Walking or climbing stairs? 0  Dressing or bathing? 1  Doing errands, shopping? 1  Preparing Food and eating ? Y  Using the Toilet? N  In the past six months, have you accidently leaked urine? N  Do you have problems  with loss of bowel control? Y  Managing your Medications? Y  Managing your Finances? Y  Housekeeping or managing your Housekeeping? Y    Patient Care Team: Norleen Lynwood ORN, MD as PCP - [facility], [facility] as Consulting Physician (Ophthalmology)  Indicate any recent Medical Services you may have received from other than [facility] providers in the past year (date may be approximate).     Assessment:   This is a routine wellness examination for Teresa Riley.  Hearing/Vision screen Hearing Screening - Comments:: Denies hearing issues Vision Screening - Comments:: No regular eye exams   Goals Addressed             This Visit's Progress    Patient Stated       08/19/2023, denies goals       Depression Screen    08/19/2023    2:42 PM 11/08/2022    3:05 PM 03/16/2022   11:39 AM 03/16/2022   11:28 AM 03/11/2022    2:29 PM 03/23/2021    2:02 PM 02/24/2021   11:30 AM  PHQ 2/9 Scores  PHQ - 2 Score 1 0 0  0 0 0  PHQ- 9 Score 3        Exception Documentation    Patient refusal       Fall Risk    08/19/2023    2:40 PM 11/08/2022    3:05 PM 03/16/2022   11:39 AM 03/16/2022   11:28 AM 03/11/2022    2:27 PM  Fall Risk   Falls in the past year? 0 0 0 0 0  Number falls in past yr: 0 0 0  0  Injury with Fall? 0 0 0 0 0  Risk for fall due to : Medication side effect No Fall Risks  No Fall Risks No Fall Risks  Follow up Falls prevention discussed;Falls evaluation completed Falls evaluation completed  Falls evaluation completed Falls prevention discussed    MEDICARE RISK AT HOME: Medicare Risk at Home Any stairs in or around the home?: Yes If so, are there any without handrails?: No Home free  of loose throw rugs in walkways, pet beds, electrical cords, etc?: Yes Adequate lighting in your home to reduce risk of falls?: Yes Life alert?: No Use of a cane, [place] or w/c?: Yes Grab bars in the bathroom?: No Shower chair or bench in shower?: Yes Elevated toilet seat or a  handicapped toilet?: No  TIMED UP AND GO:  Was the test performed?  No    Cognitive Function:  6 CIT not administered. Patient has diagnosis of dementia. Followed by neurology.      03/11/2022    2:40 PM 08/10/2018    1:05 PM 08/15/2017    1:53 PM 03/31/2017    1:13 PM 09/27/2016    3:57 PM  MMSE - Mini Mental State Exam  Not completed: Unable to complete      Orientation to time  3 5 4 4   Orientation to Place  5 5 5 5   Registration  3 3 3 3   Attention/ Calculation  3 0 5 5  Recall  2 3 2 1   Language- name 2 objects  2 2 2 2   Language- repeat  1 1 1 1   Language- follow 3 step command  3 3 3 3   Language- read & follow direction  1 1 1 1   Write a sentence  1 1 1 1   Copy design  1 0 1 1  Total score  25 24 28 27       07/30/2021    2:00 PM  Montreal Cognitive Assessment   Visuospatial/ Executive (0/5) 3  Naming (0/3) 3  Attention: Read list of digits (0/2) 2  Attention: Read list of letters (0/1) 1  Attention: Serial 7 subtraction starting at 100 (0/3) 0  Language: Repeat phrase (0/2) 2  Language : Fluency (0/1) 1  Abstraction (0/2) 2  Delayed Recall (0/5) 0  Orientation (0/6) 5  Total 19      Immunizations Immunization History  Administered Date(s) Administered   DTaP 07/13/2007   Fluad Quad(high Dose 65+) 04/26/2019, 06/27/2020, 03/23/2021   Influenza Split 06/25/2011   Influenza, High Dose Seasonal PF 05/27/2015, 05/28/2016, 04/28/2017, 05/02/2018   Influenza, Seasonal, Injecte, Preservative Fre 07/11/2012   Influenza,inj,Quad PF,6+ Mos 04/04/2013   Influenza-Unspecified 03/12/2014, 04/11/2018   PFIZER(Purple Top)SARS-COV-2 Vaccination 09/22/2019, 10/16/2019   PPD Test 01/19/2011   Pneumococcal Conjugate-13 07/12/2001, 10/02/2013   Pneumococcal Polysaccharide-23 10/29/2010   Varicella 02/10/2008    TDAP status: Due, Education has been provided regarding the importance of this vaccine. Advised may receive this vaccine at local pharmacy or Health Dept. Aware  to provide a copy of the vaccination record if obtained from local pharmacy or Health Dept. Verbalized acceptance and understanding.  Flu Vaccine status: Due, Education has been provided regarding the importance of this vaccine. Advised may receive this vaccine at local pharmacy or Health Dept. Aware to provide a copy of the vaccination record if obtained from local pharmacy or Health Dept. Verbalized acceptance and understanding.  Pneumococcal vaccine status: Up to date  Covid-19 vaccine status: Information provided on how to obtain vaccines.   Qualifies for Shingles Vaccine? Yes   Zostavax completed No   Shingrix Completed?: No.    Education has been provided regarding the importance of this vaccine. Patient has been advised to call insurance company to determine out of pocket expense if they have not yet received this vaccine. Advised may also receive vaccine at local pharmacy or Health Dept. Verbalized acceptance and understanding.  Screening Tests Health Maintenance  Topic Date Due  Zoster Vaccines- Shingrix (1 of 2) Never done   DTaP/Tdap/Td (2 - Tdap) 07/12/2017   INFLUENZA VACCINE  02/10/2023   COVID-19 Vaccine (3 - 2024-25 season) 03/13/2023   Medicare Annual Wellness (AWV)  08/18/2024   Pneumonia Vaccine 70+ Years old  Completed   DEXA SCAN  Completed   HPV VACCINES  Aged Out    Health Maintenance  Health Maintenance Due  Topic Date Due   Zoster Vaccines- Shingrix (1 of 2) Never done   DTaP/Tdap/Td (2 - Tdap) 07/12/2017   INFLUENZA VACCINE  02/10/2023   COVID-19 Vaccine (3 - 2024-25 season) 03/13/2023    Colorectal cancer screening: No longer required.   Mammogram status: No longer required due to age.  Bone Density status: Completed 04/23/2016.   Lung Cancer Screening: (Low Dose CT Chest recommended if Age 108-80 years, 20 pack-year currently smoking OR have quit w/in 15years.) does not qualify.   Lung Cancer Screening Referral: no  Additional  Screening:  Hepatitis C Screening: does not qualify;   Vision Screening: Recommended annual ophthalmology exams for early detection of glaucoma and other disorders of the eye. Is the patient up to date with their annual eye exam?  No  Who is the provider or what is the name of the office in which the patient attends annual eye exams? none If pt is not established with a provider, would they like to be referred to a provider to establish care? No .   Dental Screening: Recommended annual dental exams for proper oral hygiene  Diabetic Foot Exam: n /a  Community Resource Referral / Chronic Care Management: CRR required this visit?  No   CCM required this visit?  No     Plan:     I have personally reviewed and noted the following in the patient's chart:   Medical and social history Use of alcohol , tobacco or illicit drugs  Current medications and supplements including opioid prescriptions. Patient is not currently taking opioid prescriptions. Functional ability and status Nutritional status Physical activity Advanced directives List of other physicians Hospitalizations, surgeries, and ER visits in previous 12 months Vitals Screenings to include cognitive, depression, and falls Referrals and appointments  In addition, I have reviewed and discussed with patient certain preventive protocols, quality metrics, and best practice recommendations. A written personalized care plan for preventive services as well as general preventive health recommendations were provided to patient.     Ardella FORBES Dawn, LPN   01/10/7973   After Visit Summary: (MyChart) Due to this being a telephonic visit, the after visit summary with patients personalized plan was offered to patient via MyChart   Nurse Notes: none

## 2023-08-19 NOTE — Patient Instructions (Signed)
 Teresa Riley , Thank you for taking time to come for your Medicare Wellness Visit. I appreciate your ongoing commitment to your health goals. Please review the following plan we discussed and let me know if I can assist you in the future.   Referrals/Orders/Follow-Ups/Clinician Recommendations: none  This is a list of the screening recommended for you and due dates:  Health Maintenance  Topic Date Due   Zoster (Shingles) Vaccine (1 of 2) Never done   DTaP/Tdap/Td vaccine (2 - Tdap) 07/12/2017   Flu Shot  02/10/2023   COVID-19 Vaccine (3 - 2024-25 season) 03/13/2023   Medicare Annual Wellness Visit  08/18/2024   Pneumonia Vaccine  Completed   DEXA scan (bone density measurement)  Completed   HPV Vaccine  Aged Out    Advanced directives: (Copy Requested) Please bring a copy of your health care power of attorney and living will to the office to be added to your chart at your convenience.  Next Medicare Annual Wellness Visit scheduled for next year: Yes  insert Preventive Care attachment Insert FALL PREVENTION attachment if needed

## 2023-08-24 ENCOUNTER — Telehealth: Payer: Self-pay | Admitting: *Deleted

## 2023-08-24 NOTE — Progress Notes (Signed)
Complex Care Management Note Care Guide Note  08/24/2023 Name: NAYAH LUKENS MRN: [id number] DOB: 1935/01/18  Nonnie Done is a 89 y.o. year old female who is a primary care patient of Jonny Ruiz, Len Blalock, MD . The community resource team was consulted for assistance with  patient wants someone to do a safety assessment for her mom living there   SDOH screenings and interventions completed:  No        Care guide performed the following interventions: Patient provided with information about care guide support team and interviewed to confirm resource needs.  Follow Up Plan:  Care guide will follow up with patient by phone over the next days  Encounter Outcome:  Patient Visit Completed  Dione Booze  [facility] HealthPopulation Health Care Guide  Direct Dial:[phone] Fax:[phone] Website: [email or web address]

## 2023-08-29 ENCOUNTER — Telehealth: Payer: Self-pay | Admitting: *Deleted

## 2023-08-29 NOTE — Progress Notes (Signed)
Complex Care Management Note Care Guide Note  08/29/2023 Name: Teresa Riley MRN: [id number] DOB: [birth year]   Complex Care Management Outreach Attempts: An unsuccessful telephone outreach was attempted today to offer the patient information about available complex care management services.  Follow Up Plan:  Additional outreach attempts will be made to offer the patient complex care management information and services.   Encounter Outcome:No answer  Dione Booze  [facility] HealthPopulation Health Care Guide  Direct [phone] Fax:[phone] Website: [email or web address]

## 2023-08-30 ENCOUNTER — Telehealth: Payer: Self-pay | Admitting: *Deleted

## 2023-08-30 ENCOUNTER — Ambulatory Visit: Payer: Medicare HMO | Admitting: Internal Medicine

## 2023-08-30 NOTE — Progress Notes (Signed)
Complex Care Management Note Care Guide Note  08/30/2023 Name: Teresa Riley MRN: [id number] DOB: 08-23-1934  Teresa Riley is a 89 y.o. year old female who is a primary care patient of Jonny Ruiz, Len Blalock, MD . The community resource team was consulted for assistance with  grab bars  SDOH screenings and interventions completed:  Yes        Care guide performed the following interventions: Patient provided with information about care guide support team and interviewed to confirm resource needs. Will try to get an assessment ordered from the dr for pt and ot  for grab bar use  Follow Up Plan:  No further follow up planned at this time. The patient has been provided with needed resources.  Encounter Outcome:  Patient Visit Completed Dione Booze  [facility] HealthPopulation Health Care Guide  Direct [phone] [phone] Website: [email or web address]

## 2023-09-01 ENCOUNTER — Ambulatory Visit: Payer: Medicare HMO | Admitting: Internal Medicine

## 2023-09-07 ENCOUNTER — Emergency Department (HOSPITAL_COMMUNITY)
Admission: EM | Admit: 2023-09-07 | Discharge: 2023-09-07 | Disposition: A | Discharge status: Home / Self Care | Payer: Medicare HMO

## 2023-09-07 ENCOUNTER — Other Ambulatory Visit: Payer: Self-pay

## 2023-09-07 ENCOUNTER — Emergency Department (HOSPITAL_COMMUNITY): Payer: Medicare HMO

## 2023-09-07 ENCOUNTER — Encounter (HOSPITAL_COMMUNITY): Payer: Self-pay

## 2023-09-07 DIAGNOSIS — W19XXXA Unspecified fall, initial encounter: Secondary | ICD-10-CM | POA: Diagnosis not present

## 2023-09-07 DIAGNOSIS — J069 Acute upper respiratory infection, unspecified: Secondary | ICD-10-CM | POA: Insufficient documentation

## 2023-09-07 DIAGNOSIS — N189 Chronic kidney disease, unspecified: Secondary | ICD-10-CM | POA: Insufficient documentation

## 2023-09-07 DIAGNOSIS — R531 Weakness: Secondary | ICD-10-CM | POA: Diagnosis not present

## 2023-09-07 DIAGNOSIS — F0390 Unspecified dementia without behavioral disturbance: Secondary | ICD-10-CM | POA: Diagnosis not present

## 2023-09-07 DIAGNOSIS — I129 Hypertensive chronic kidney disease with stage 1 through stage 4 chronic kidney disease, or unspecified chronic kidney disease: Secondary | ICD-10-CM | POA: Diagnosis not present

## 2023-09-07 DIAGNOSIS — Z79899 Other long term (current) drug therapy: Secondary | ICD-10-CM | POA: Diagnosis not present

## 2023-09-07 DIAGNOSIS — Z7951 Long term (current) use of inhaled steroids: Secondary | ICD-10-CM | POA: Diagnosis not present

## 2023-09-07 DIAGNOSIS — E860 Dehydration: Secondary | ICD-10-CM | POA: Insufficient documentation

## 2023-09-07 DIAGNOSIS — Z743 Need for continuous supervision: Secondary | ICD-10-CM | POA: Diagnosis not present

## 2023-09-07 DIAGNOSIS — R0602 Shortness of breath: Secondary | ICD-10-CM | POA: Diagnosis not present

## 2023-09-07 DIAGNOSIS — I10 Essential (primary) hypertension: Secondary | ICD-10-CM | POA: Diagnosis not present

## 2023-09-07 DIAGNOSIS — R059 Cough, unspecified: Secondary | ICD-10-CM | POA: Diagnosis present

## 2023-09-07 DIAGNOSIS — R9389 Abnormal findings on diagnostic imaging of other specified body structures: Secondary | ICD-10-CM | POA: Diagnosis not present

## 2023-09-07 LAB — CBC WITH DIFFERENTIAL/PLATELET
Abs Immature Granulocytes: 0.02 10*3/uL (ref 0.00–0.07)
Basophils Absolute: 0 10*3/uL (ref 0.0–0.1)
Basophils Relative: 0 %
Eosinophils Absolute: 0 10*3/uL (ref 0.0–0.5)
Eosinophils Relative: 0 %
HCT: 43.8 % (ref 36.0–46.0)
Hemoglobin: 13.9 g/dL (ref 12.0–15.0)
Immature Granulocytes: 0 %
Lymphocytes Relative: 27 %
Lymphs Abs: 2.2 10*3/uL (ref 0.7–4.0)
MCH: 30.2 pg (ref 26.0–34.0)
MCHC: 31.7 g/dL (ref 30.0–36.0)
MCV: 95.2 fL (ref 80.0–100.0)
Monocytes Absolute: 0.7 10*3/uL (ref 0.1–1.0)
Monocytes Relative: 9 %
Neutro Abs: 5.1 10*3/uL (ref 1.7–7.7)
Neutrophils Relative %: 64 %
Platelets: 215 10*3/uL (ref 150–400)
RBC: 4.6 MIL/uL (ref 3.87–5.11)
RDW: 13.1 % (ref 11.5–15.5)
WBC: 8 10*3/uL (ref 4.0–10.5)
nRBC: 0 % (ref 0.0–0.2)

## 2023-09-07 LAB — BASIC METABOLIC PANEL
Anion gap: 13 (ref 5–15)
BUN: 16 mg/dL (ref 8–23)
CO2: 23 mmol/L (ref 22–32)
Calcium: 9.6 mg/dL (ref 8.9–10.3)
Chloride: 102 mmol/L (ref 98–111)
Creatinine, Ser: 1.21 mg/dL — ABNORMAL HIGH (ref 0.44–1.00)
GFR, Estimated: 44 mL/min — ABNORMAL LOW (ref >60)
Glucose, Bld: 126 mg/dL — ABNORMAL HIGH (ref 70–99)
Potassium: 4.5 mmol/L (ref 3.5–5.1)
Sodium: 138 mmol/L (ref 135–145)

## 2023-09-07 LAB — RESP PANEL BY RT-PCR (RSV, FLU A&B, COVID)  RVPGX2
Influenza A by PCR: NEGATIVE
Influenza B by PCR: NEGATIVE
Resp Syncytial Virus by PCR: NEGATIVE
SARS Coronavirus 2 by RT PCR: NEGATIVE

## 2023-09-07 LAB — BRAIN NATRIURETIC PEPTIDE: B Natriuretic Peptide: 43.8 pg/mL (ref 0.0–100.0)

## 2023-09-07 MED ORDER — SODIUM CHLORIDE 0.9 % IV BOLUS
500.0000 mL | Freq: Once | INTRAVENOUS | Status: AC
  Administered 2023-09-07: 500 mL via INTRAVENOUS

## 2023-09-07 MED ORDER — IPRATROPIUM-ALBUTEROL 0.5-2.5 (3) MG/3ML IN SOLN
3.0000 mL | Freq: Once | RESPIRATORY_TRACT | Status: AC
Start: 2023-09-07 — End: 2023-09-07
  Administered 2023-09-07: 3 mL via RESPIRATORY_TRACT
  Filled 2023-09-07: qty 3

## 2023-09-07 MED ORDER — OXYMETAZOLINE HCL 0.05 % NA SOLN
1.0000 | Freq: Once | NASAL | Status: AC
  Administered 2023-09-07: 1 via NASAL
  Filled 2023-09-07: qty 30

## 2023-09-07 MED ORDER — ALBUTEROL SULFATE HFA 108 (90 BASE) MCG/ACT IN AERS: 2.0000 | INHALATION_SPRAY | RESPIRATORY_TRACT | 0 refills | Status: AC | PRN

## 2023-09-07 NOTE — ED Notes (Signed)
 Pt ambulated with [place].

## 2023-09-07 NOTE — ED Triage Notes (Addendum)
 Patient brought in by EMS due to weakness. Pt slipped out of her bed and EMS was called for lifting help. Family was going to take pt to her PCP but could not get patient out of the house due to the steps. Pt reports weakness X a few days. Family also reports congestion for a few days. Pt has history of dementia.

## 2023-09-07 NOTE — Discharge Instructions (Addendum)
 Please use 2 puffs of the albuterol every 4 hours as needed for cough/congestion.  Please have her follow-up with her doctor.  Return to the ER for worsening symptoms.

## 2023-09-07 NOTE — ED Provider Notes (Signed)
 [place] EMERGENCY DEPARTMENT AT [facility] Provider Note   CSN: [id number] Arrival date & time: 09/07/23  4098     History  Chief Complaint  Patient presents with   Weakness    Teresa Riley is a 89 y.o. female.  89 year old female with past medical history of CKD and vascular dementia as well as hypertension presenting to the emergency department today with generalized weakness.  The patient is apparently had some cough and congestion over the past couple days.  Patient does have a vascular dementia which does make obtaining some history difficult.  She states that she was feeling weak in both of her legs and slid down to the floor when she tried to get up today.  She does report some mild shortness of breath.  Denies any associated chest pain.  She denies any abdominal pain.  Denies any vomiting or diarrhea.   Weakness Associated symptoms: cough and shortness of breath        Home Medications Prior to Admission medications   Medication Sig Start Date End Date Taking? Authorizing Provider  albuterol (VENTOLIN HFA) 108 (90 Base) MCG/ACT inhaler Inhale 2 puffs into the lungs every 4 (four) hours as needed for wheezing or shortness of breath. 09/07/23  Yes Durwin Glaze, MD  acetaminophen (TYLENOL) 500 MG tablet Take 1,000 mg by mouth every 6 (six) hours as needed for headache or fever (pain). Patient not taking: Reported on 08/19/2023    [provider]  albuterol (PROVENTIL) (2.5 MG/3ML) 0.083% nebulizer solution Take 3 mLs (2.5 mg total) by nebulization every 6 (six) hours as needed for wheezing or shortness of breath. Patient not taking: Reported on 08/19/2023 03/29/21   Corwin Levins, MD  ALPRAZolam Prudy Feeler) 0.25 MG tablet TAKE 1 TABLET(0.25 MG) BY MOUTH TWICE DAILY AS NEEDED 12/01/22   Corwin Levins, MD  amLODipine (NORVASC) 10 MG tablet TAKE 1 TABLET(10 MG) BY MOUTH DAILY 07/28/23   Corwin Levins, MD  atorvastatin (LIPITOR) 20 MG tablet Take 1 tablet (20 mg  total) by mouth daily. 12/01/22 12/01/23  Corwin Levins, MD  citalopram (CELEXA) 10 MG tablet TAKE 1 TABLET(10 MG) BY MOUTH DAILY 04/27/23   Corwin Levins, MD  Fluticasone-Umeclidin-Vilant (TRELEGY ELLIPTA) 100-62.5-25 MCG/ACT AEPB Inhale 1 puff into the lungs daily. Patient not taking: Reported on 08/19/2023 11/08/22   Corwin Levins, MD  guaiFENesin-dextromethorphan [facility] DM) 100-10 MG/5ML syrup Take 10 mLs by mouth every 4 (four) hours as needed for cough. Patient not taking: Reported on 08/19/2023 05/29/21   Kathlen Mody, MD  labetalol (NORMODYNE) 300 MG tablet TAKE 1 TABLET(300 MG) BY MOUTH TWICE DAILY 05/05/22   Corwin Levins, MD  lisinopril (ZESTRIL) 40 MG tablet TAKE 1 TABLET(40 MG) BY MOUTH DAILY 05/25/23   Corwin Levins, MD  [facility] 28-10 MG CP24 TAKE 1 CAPSULE BY MOUTH EVERY MORNING 05/10/23   Levert Feinstein, MD  pantoprazole (PROTONIX) 40 MG tablet Take 1 tablet (40 mg total) by mouth daily at 6 (six) AM. Patient not taking: Reported on 08/19/2023 05/30/21   Kathlen Mody, MD  Polyethyl Glycol-Propyl Glycol 0.4-0.3 % SOLN Place 1 drop into both eyes daily as needed (dry eyes).     [provider]  QUEtiapine (SEROQUEL) 50 MG tablet TAKE 1 TABLET(50 MG) BY MOUTH AT BEDTIME 04/12/23   Corwin Levins, MD      Allergies    Patient has no known allergies.    Review of Systems  Review of Systems  Constitutional:  Positive for fatigue.  Respiratory:  Positive for cough and shortness of breath.   Neurological:  Positive for weakness.  All other systems reviewed and are negative.   Physical Exam Updated Vital Signs BP (!) 176/93   Pulse 87   Temp 98.1 F (36.7 C) (Oral)   Resp 18   Ht 5\' 3"  (1.6 m)   Wt 91.6 kg   SpO2 96%   BMI 35.77 kg/m  Physical Exam Vitals and nursing note reviewed.   Gen: NAD Eyes: PERRL, EOMI HEENT: no oropharyngeal swelling Neck: trachea midline Resp: clear to auscultation bilaterally Card: RRR, no murmurs, rubs, or gallops Abd: nontender,  nondistended Extremities: no calf tenderness, no edema Vascular: 2+ radial pulses bilaterally, 2+ DP pulses bilaterally Neuro: Cranial nerves intact, equal strength and sensation throughout bilateral upper and lower extremities Skin: no rashes   ED Results / Procedures / Treatments   Labs (all labs ordered are listed, but only abnormal results are displayed) Labs Reviewed  BASIC METABOLIC PANEL - Abnormal; Notable for the following components:      Result Value   Glucose, Bld 126 (*)    Creatinine, Ser 1.21 (*)    GFR, Estimated 44 (*)    All other components within normal limits  RESP PANEL BY RT-PCR (RSV, FLU A&B, COVID)  RVPGX2  BRAIN NATRIURETIC PEPTIDE  CBC WITH DIFFERENTIAL/PLATELET    EKG None  Radiology DG Chest 2 View Result Date: 09/07/2023 CLINICAL DATA:  Shortness of breath. EXAM: CHEST - 2 VIEW COMPARISON:  06/14/2023. FINDINGS: Bilateral lung fields are clear. Bilateral costophrenic angles are clear. Note is made of elevated right hemidiaphragm. Normal cardio-mediastinal silhouette. No acute osseous abnormalities. The soft tissues are within normal limits. IMPRESSION: No active cardiopulmonary disease. Electronically Signed   By: Jules Schick M.D.   On: 09/07/2023 11:23    Procedures Procedures    Medications Ordered in ED Medications  ipratropium-albuterol (DUONEB) 0.5-2.5 (3) MG/3ML nebulizer solution 3 mL (3 mLs Nebulization Given 09/07/23 1210)  oxymetazoline (AFRIN) 0.05 % nasal spray 1 spray (1 spray Each Nare Given 09/07/23 1252)  sodium chloride 0.9 % bolus 500 mL (500 mLs Intravenous New Bag/Given 09/07/23 1448)    ED Course/ Medical Decision Making/ A&P                                 Medical Decision Making 89 year old female with past medical history of hypertension, chronic kidney disease, and vascular dementia presenting to the emergency department today with generalized weakness.  I will further evaluate the patient here with basic labs to  evaluate for anemia or electrolyte abnormality.  Will obtain a chest x-ray to evaluate for pneumonia.  The patient has diminished bilaterally we will try to give her a DuoNeb to see if this helps with her symptoms.  Will obtain RSV/COVID/flu swab to evaluate for viral etiologies.  I will reevaluate for ultimate disposition.  She does not have any focal deficits to suggest CVA at this time.  The patient's labs are reassuring.  She does have a mild AKI.  She is ordered IV fluids.  I did discuss this with the patient and her daughter.  Patient is feeling better after fluids.  She is ambulatory here at her baseline.  We discussed admission for IV fluids versus going home with close outpatient follow-up with oral hydration.  Ultimately the patient is discharged through shared decision  making.  She is discharged with return precautions.  Amount and/or Complexity of Data Reviewed Labs: ordered. Radiology: ordered.  Risk OTC drugs. Prescription drug management.           Final Clinical Impression(s) / ED Diagnoses Final diagnoses:  Upper respiratory tract infection, unspecified type  Dehydration    Rx / DC Orders ED Discharge Orders          Ordered    albuterol (VENTOLIN HFA) 108 (90 Base) MCG/ACT inhaler  Every 4 hours PRN        09/07/23 1525              Durwin Glaze, MD 09/07/23 1526

## 2023-09-15 ENCOUNTER — Encounter: Payer: Self-pay | Admitting: Internal Medicine

## 2023-09-15 ENCOUNTER — Ambulatory Visit (INDEPENDENT_AMBULATORY_CARE_PROVIDER_SITE_OTHER): Payer: Medicare HMO | Admitting: Internal Medicine

## 2023-09-15 VITALS — BP 140/82 | HR 70 | Temp 97.7°F | Ht 63.0 in

## 2023-09-15 DIAGNOSIS — R739 Hyperglycemia, unspecified: Secondary | ICD-10-CM | POA: Diagnosis not present

## 2023-09-15 DIAGNOSIS — E7800 Pure hypercholesterolemia, unspecified: Secondary | ICD-10-CM

## 2023-09-15 DIAGNOSIS — R296 Repeated falls: Secondary | ICD-10-CM

## 2023-09-15 DIAGNOSIS — F0390 Unspecified dementia without behavioral disturbance: Secondary | ICD-10-CM | POA: Diagnosis not present

## 2023-09-15 DIAGNOSIS — F418 Other specified anxiety disorders: Secondary | ICD-10-CM | POA: Diagnosis not present

## 2023-09-15 DIAGNOSIS — R350 Frequency of micturition: Secondary | ICD-10-CM | POA: Diagnosis not present

## 2023-09-15 DIAGNOSIS — I10 Essential (primary) hypertension: Secondary | ICD-10-CM | POA: Diagnosis not present

## 2023-09-15 DIAGNOSIS — Z0001 Encounter for general adult medical examination with abnormal findings: Secondary | ICD-10-CM | POA: Diagnosis not present

## 2023-09-15 DIAGNOSIS — E559 Vitamin D deficiency, unspecified: Secondary | ICD-10-CM

## 2023-09-15 LAB — URINALYSIS, ROUTINE W REFLEX MICROSCOPIC
Bilirubin Urine: NEGATIVE
Hgb urine dipstick: NEGATIVE
Ketones, ur: NEGATIVE
Leukocytes,Ua: NEGATIVE
Nitrite: NEGATIVE
RBC / HPF: NONE SEEN (ref 0–?)
Specific Gravity, Urine: 1.015 (ref 1.000–1.030)
Total Protein, Urine: NEGATIVE
Urine Glucose: NEGATIVE
Urobilinogen, UA: 0.2 (ref 0.0–1.0)
pH: 7 (ref 5.0–8.0)

## 2023-09-15 MED ORDER — CITALOPRAM HYDROBROMIDE 20 MG PO TABS: 20.0000 mg | ORAL_TABLET | Freq: Every day | ORAL | 3 refills | Status: DC

## 2023-09-15 MED ORDER — QUETIAPINE FUMARATE 50 MG PO TABS
50.0000 mg | ORAL_TABLET | Freq: Every day | ORAL | 1 refills | Status: DC
Start: 2023-09-15 — End: 2024-04-18

## 2023-09-15 MED ORDER — PANTOPRAZOLE SODIUM 40 MG PO TBEC: 40.0000 mg | DELAYED_RELEASE_TABLET | Freq: Every day | ORAL | 3 refills | Status: DC

## 2023-09-15 MED ORDER — LABETALOL HCL 300 MG PO TABS: ORAL_TABLET | ORAL | 3 refills | Status: DC

## 2023-09-15 MED ORDER — LISINOPRIL 40 MG PO TABS
ORAL_TABLET | ORAL | 3 refills | Status: DC
Start: 2023-09-15 — End: 2024-04-30

## 2023-09-15 MED ORDER — AMLODIPINE BESYLATE 10 MG PO TABS
10.0000 mg | ORAL_TABLET | Freq: Every day | ORAL | 3 refills | Status: DC
Start: 2023-09-15 — End: 2024-04-30

## 2023-09-15 MED ORDER — ATORVASTATIN CALCIUM 20 MG PO TABS: 20.0000 mg | ORAL_TABLET | Freq: Every day | ORAL | 3 refills | Status: DC

## 2023-09-15 NOTE — Progress Notes (Signed)
 Patient ID: Teresa Riley, female   DOB: November 03, 1934, 89 y.o.   MRN: [id number]         Chief Complaint:: wellness exam and urinary frequency, dementia, depression, htn, hld, hypergylcemia, low vit d       HPI:  Teresa Riley is a 89 y.o. female here for wellness exam; decliens all immunizations, o/w up to date                        Also Pt denies chest pain, increased sob or doe, wheezing, orthopnea, PND, increased LE swelling, palpitations, dizziness or syncope.   Pt denies polydipsia, polyuria, or new focal neuro s/s.    Pt denies fever, wt loss, night sweats, loss of appetite, or other constitutional symptoms  Denies urinary symptoms such as dysuria, urgency, flank pain, hematuria or n/v, fever, chills, but does have urinary frequency for the last 3 days.  Dementia overall stable symptomatically, and not assoc with behavioral changes such as hallucinations, paranoia, or agitation but has worsening weakness, needs bedrails for the bed, and BSC.  Has had mild worsening depressive symptoms, but no suicidal ideation, or panic; has ongoing anxiety   Wt Readings from Last 3 Encounters:  09/07/23 201 lb 15.1 oz (91.6 kg)  11/08/22 202 lb (91.6 kg)  03/16/22 197 lb (89.4 kg)   BP Readings from Last 3 Encounters:  09/15/23 (!) 140/82  09/07/23 (!) 174/91  06/14/23 (!) 183/92   Immunization History  Administered Date(s) Administered   DTaP 07/13/2007   Fluad Quad(high Dose 65+) 04/26/2019, 06/27/2020, 03/23/2021   Influenza Split 06/25/2011   Influenza, High Dose Seasonal PF 05/27/2015, 05/28/2016, 04/28/2017, 05/02/2018   Influenza, Seasonal, Injecte, Preservative Fre 07/11/2012   Influenza,inj,Quad PF,6+ Mos 04/04/2013   Influenza-Unspecified 03/12/2014, 04/11/2018   PFIZER(Purple Top)SARS-COV-2 Vaccination 09/22/2019, 10/16/2019   PPD Test 01/19/2011   Pneumococcal Conjugate-13 07/12/2001, 10/02/2013   Pneumococcal Polysaccharide-23 10/29/2010   Varicella 02/10/2008   Health Maintenance  Due  Topic Date Due   Zoster Vaccines- Shingrix (1 of 2) Never done   DTaP/Tdap/Td (2 - Tdap) 07/12/2017   COVID-19 Vaccine (3 - 2024-25 season) 03/13/2023      Past Medical History:  Diagnosis Date   Allergic rhinitis 09/05/2014   Allergic rhinitis, cause unspecified 10/29/2010   Chronic constipation 10/29/2010   CKD (chronic kidney disease) stage 3, GFR 30-59 ml/min (HCC) 04/28/2017   Eczema 10/29/2010   Gait difficulty    H/O: hysterectomy 10/29/2010   History of cervical cancer 10/29/2010   HTN (hypertension) 10/29/2010   Hyperlipidemia    Hypertension    Impaired glucose tolerance 08/16/2012   Loss of appetite    Memory loss    Orthostatic hypotension 06/05/2012   Osteoporosis    Right hip pain    Seizures (HCC)    Vascular dementia (HCC)    Vitamin D deficiency 09/05/2014   Past Surgical History:  Procedure Laterality Date   ABDOMINAL HYSTERECTOMY  1970   BREAST BIOPSY  1980's   benign   CATARACT EXTRACTION W/ INTRAOCULAR LENS  IMPLANT, BILATERAL  01/2011   EYE SURGERY      reports that she quit smoking about 52 years ago. Her smoking use included cigarettes. She has never used smokeless tobacco. She reports that she does not drink alcohol and does not use drugs. family history includes Diabetes in her father; High blood pressure in her daughter; Hypertension in her father and mother. No Known Allergies Current Outpatient Medications on  File Prior to Visit  Medication Sig Dispense Refill   albuterol (VENTOLIN HFA) 108 (90 Base) MCG/ACT inhaler Inhale 2 puffs into the lungs every 4 (four) hours as needed for wheezing or shortness of breath. 1 each 0   ALPRAZolam (XANAX) 0.25 MG tablet TAKE 1 TABLET(0.25 MG) BY MOUTH TWICE DAILY AS NEEDED 60 tablet 5   NAMZARIC 28-10 MG CP24 TAKE 1 CAPSULE BY MOUTH EVERY MORNING 90 capsule 3   Polyethyl Glycol-Propyl Glycol 0.4-0.3 % SOLN Place 1 drop into both eyes daily as needed (dry eyes).      acetaminophen (TYLENOL) 500 MG tablet Take  1,000 mg by mouth every 6 (six) hours as needed for headache or fever (pain). (Patient not taking: Reported on 08/19/2023)     albuterol (PROVENTIL) (2.5 MG/3ML) 0.083% nebulizer solution Take 3 mLs (2.5 mg total) by nebulization every 6 (six) hours as needed for wheezing or shortness of breath. (Patient not taking: Reported on 08/19/2023) 150 mL 1   Fluticasone-Umeclidin-Vilant (TRELEGY ELLIPTA) 100-62.5-25 MCG/ACT AEPB Inhale 1 puff into the lungs daily. (Patient not taking: Reported on 09/15/2023) 3 each 3   guaiFENesin-dextromethorphan (ROBITUSSIN DM) 100-10 MG/5ML syrup Take 10 mLs by mouth every 4 (four) hours as needed for cough. (Patient not taking: Reported on 09/15/2023) 118 mL 0   No current facility-administered medications on file prior to visit.        ROS:  All others reviewed and negative.  Objective        PE:  BP (!) 140/82 (BP Location: Left Arm, Patient Position: Sitting, Cuff Size: Normal)   Pulse 70   Temp 97.7 F (36.5 C) (Oral)   Ht 5\' 3"  (1.6 m)   SpO2 91%   BMI 35.77 kg/m                 Constitutional: Pt appears in NAD               HENT: Head: NCAT.                Right Ear: External ear normal.                 Left Ear: External ear normal.                Eyes: . Pupils are equal, round, and reactive to light. Conjunctivae and EOM are normal               Nose: without d/c or deformity               Neck: Neck supple. Gross normal ROM               Cardiovascular: Normal rate and regular rhythm.                 Pulmonary/Chest: Effort normal and breath sounds without rales or wheezing.                Abd:  Soft, NT, ND, + BS, no organomegaly               Neurological: Pt is alert. At baseline orientation, motor grossly intact               Skin: Skin is warm. No rashes, no other new lesions, LE edema - none               Psychiatric: Pt behavior is normal without agitation ,depressed affect  Micro: none  Cardiac tracings I have  personally interpreted today:   none  Pertinent Radiological findings (summarize): none   Lab Results  Component Value Date   WBC 8.0 09/07/2023   HGB 13.9 09/07/2023   HCT 43.8 09/07/2023   PLT 215 09/07/2023   GLUCOSE 126 (H) 09/07/2023   CHOL 141 03/16/2022   TRIG 107.0 03/16/2022   HDL 44.50 03/16/2022   LDLDIRECT 123.0 03/23/2021   LDLCALC 75 03/16/2022   ALT 17 06/14/2023   AST 21 06/14/2023   NA 138 09/07/2023   K 4.5 09/07/2023   CL 102 09/07/2023   CREATININE 1.21 (H) 09/07/2023   BUN 16 09/07/2023   CO2 23 09/07/2023   TSH 1.21 03/16/2022   HGBA1C 7.0 (H) 03/16/2022   Assessment/Plan:  Teresa Riley is a 89 y.o. Black or African American [2] female with  has a past medical history of Allergic rhinitis (09/05/2014), Allergic rhinitis, cause unspecified (10/29/2010), Chronic constipation (10/29/2010), CKD (chronic kidney disease) stage 3, GFR 30-59 ml/min (HCC) (04/28/2017), Eczema (10/29/2010), Gait difficulty, H/O: hysterectomy (10/29/2010), History of cervical cancer (10/29/2010), HTN (hypertension) (10/29/2010), Hyperlipidemia, Hypertension, Impaired glucose tolerance (08/16/2012), Loss of appetite, Memory loss, Orthostatic hypotension (06/05/2012), Osteoporosis, Right hip pain, Seizures (HCC), Vascular dementia (HCC), and Vitamin D deficiency (09/05/2014).  Encounter for well adult exam with abnormal findings Age and sex appropriate education and counseling updated with regular exercise and diet Referrals for preventative services - none needed Immunizations addressed - declines all Smoking counseling  - none needed Evidence for depression or other mood disorder - for increased celexa 20 qd Most recent labs reviewed. I have personally reviewed and have noted: 1) the patient's medical and social history 2) The patient's current medications and supplements 3) The patient's height, weight, and BMI have been recorded in the chart   Dementia (HCC) With midl worsening it seems, for [facility] with RN, PT, aide, and  bedrails, BSC  Anxious depression With mild worsening recently it seems, for increased celexa 20 qd  HTN (hypertension) BP Readings from Last 3 Encounters:  09/15/23 (!) 140/82  09/07/23 (!) 174/91  06/14/23 (!) 183/92   Mild uncontrolled, pt to continue medical treatment norvasc 0 every day, labetolol 300 bid, lsinopril 40 every day ow/ declines changes today   Hyperglycemia Lab Results  Component Value Date   HGBA1C 7.0 (H) 03/16/2022   Stable for age, pt to continue current medical treatment  - diet, wt control  Hyperlipidemia Lab Results  Component Value Date   LDLCALC 75 03/16/2022   Unconrolled,, pt to continue current statin lipitor 20 every day and lower chol diet, declines change today   Urinary frequency Mild , will need ua and culture to r/o UTI  Vitamin D deficiency Last vitamin D Lab Results  Component Value Date   VD25OH 31.54 03/16/2022   Low, to start oral replacement  Followup: Return in about 6 months (around 03/17/2024).  Oliver Barre, MD 09/18/2023 2:34 PM [facility]

## 2023-09-15 NOTE — Patient Instructions (Addendum)
 Ok to increase the celexa to 20 mg per day  Please continue all other medications as before, and refills have been done if requested.  Please have the pharmacy call with any other refills you may need.  Please continue your efforts at being more active, low cholesterol diet, and weight control.  You are otherwise up to date with prevention measures today.  Please keep your appointments with your specialists as you may have planned  You will be contacted regarding the referral for: Home Health with RN, PT, and aide  You are given the scripts for Bed rails, and BSC  Please go to the LAB at the blood drawing area for the tests to be done - just the urine testing today  You will be contacted by phone if any changes need to be made immediately.  Otherwise, you will receive a letter about your results with an explanation, but please check with MyChart first.  Please make an Appointment to return in 6 months, or sooner if needed

## 2023-09-16 ENCOUNTER — Encounter: Payer: Self-pay | Admitting: Internal Medicine

## 2023-09-16 LAB — URINE CULTURE: Result:: NO GROWTH

## 2023-09-18 ENCOUNTER — Encounter: Payer: Self-pay | Admitting: Internal Medicine

## 2023-09-18 DIAGNOSIS — R350 Frequency of micturition: Secondary | ICD-10-CM | POA: Insufficient documentation

## 2023-09-18 NOTE — Assessment & Plan Note (Signed)
 Age and sex appropriate education and counseling updated with regular exercise and diet Referrals for preventative services - none needed Immunizations addressed - declines all Smoking counseling  - none needed Evidence for depression or other mood disorder - for increased celexa 20 qd Most recent labs reviewed. I have personally reviewed and have noted: 1) the patient's medical and social history 2) The patient's current medications and supplements 3) The patient's height, weight, and BMI have been recorded in the chart

## 2023-09-18 NOTE — Assessment & Plan Note (Signed)
 Mild , will need ua and culture to r/o UTI

## 2023-09-18 NOTE — Assessment & Plan Note (Signed)
 With midl worsening it seems, for [facility] with RN, PT, aide, and bedrails, BSC

## 2023-09-18 NOTE — Assessment & Plan Note (Signed)
 Lab Results  Component Value Date   LDLCALC 75 03/16/2022   Unconrolled,, pt to continue current statin lipitor 20 every day and lower chol diet, declines change today

## 2023-09-18 NOTE — Assessment & Plan Note (Signed)
 BP Readings from Last 3 Encounters:  09/15/23 (!) 140/82  09/07/23 (!) 174/91  06/14/23 (!) 183/92   Mild uncontrolled, pt to continue medical treatment norvasc 0 every day, labetolol 300 bid, lsinopril 40 every day ow/ declines changes today

## 2023-09-18 NOTE — Assessment & Plan Note (Signed)
 Lab Results  Component Value Date   HGBA1C 7.0 (H) 03/16/2022   Stable for age, pt to continue current medical treatment  - diet, wt control

## 2023-09-18 NOTE — Assessment & Plan Note (Signed)
Last vitamin D Lab Results  Component Value Date   VD25OH 31.54 03/16/2022   Low, to start oral replacement

## 2023-09-18 NOTE — Assessment & Plan Note (Signed)
 With mild worsening recently it seems, for increased celexa 20 qd

## 2023-11-23 ENCOUNTER — Other Ambulatory Visit: Payer: Self-pay | Admitting: Internal Medicine

## 2024-01-16 ENCOUNTER — Other Ambulatory Visit: Payer: Self-pay | Admitting: Family Medicine

## 2024-03-28 ENCOUNTER — Other Ambulatory Visit: Payer: Self-pay | Admitting: Internal Medicine

## 2024-04-16 ENCOUNTER — Telehealth: Payer: Self-pay | Admitting: Internal Medicine

## 2024-04-16 ENCOUNTER — Other Ambulatory Visit: Payer: Self-pay | Admitting: Internal Medicine

## 2024-04-16 NOTE — Telephone Encounter (Unsigned)
 Copied from CRM #[id number]. Topic: Clinical - Medication Refill >> Apr 16, 2024  9:50 AM Kendralyn S wrote: Medication:  QUEtiapine  (SEROQUEL ) 50 MG tablet   Has the patient contacted their pharmacy? Yes (Agent: If no, request that the patient contact the pharmacy for the refill. If patient does not wish to contact the pharmacy document the reason why and proceed with request.) (Agent: If yes, when and what did the pharmacy advise?)  This is the patient's preferred pharmacy:  [id number] [address], [place] - [address] AT [facility] OF [address] & [facility] [address] [place] [address] Phone: [phone] Fax: [phone]  Is this the correct pharmacy for this prescription? Yes If no, delete pharmacy and type the correct one.   Has the prescription been filled recently? No  Is the patient out of the medication? No, 3 left  Has the patient been seen for an appointment in the last year OR does the patient have an upcoming appointment? Yes  Can we respond through MyChart? No  Agent: Please be advised that Rx refills may take up to 3 business days. We ask that you follow-up with your pharmacy.

## 2024-04-16 NOTE — Telephone Encounter (Signed)
 Copied from CRM #[id number]. Topic: Clinical - Medication Refill >> Apr 16, 2024  9:50 AM Kendralyn S wrote: Medication:  QUEtiapine  (SEROQUEL ) 50 MG tablet   Has the patient contacted their pharmacy? Yes (Agent: If no, request that the patient contact the pharmacy for the refill. If patient does not wish to contact the pharmacy document the reason why and proceed with request.) (Agent: If yes, when and what did the pharmacy advise?)  This is the patient's preferred pharmacy:  [id number] [place], [place] - [address] AT [facility] OF [address] & [facility] [address] [place] [address] Phone: [phone] Fax: [phone]  Is this the correct pharmacy for this prescription? Yes If no, delete pharmacy and type the correct one.   Has the prescription been filled recently? No  Is the patient out of the medication? No, 3 left  Has the patient been seen for an appointment in the last year OR does the patient have an upcoming appointment? Yes  Can we respond through MyChart? No  Agent: Please be advised that Rx refills may take up to 3 business days. We ask that you follow-up with your pharmacy.

## 2024-04-18 ENCOUNTER — Telehealth: Payer: Self-pay

## 2024-04-18 ENCOUNTER — Other Ambulatory Visit: Payer: Self-pay

## 2024-04-18 MED ORDER — QUETIAPINE FUMARATE 50 MG PO TABS: 50.0000 mg | ORAL_TABLET | Freq: Every day | ORAL | 1 refills | Status: DC

## 2024-04-18 NOTE — Telephone Encounter (Signed)
 Patients daughter called inquiring about refill request again. Let her know refill has been sent in, also scheduled overdue F/U for patient to keep refills on schedule. Daughter very appreciative, as patient has dementia and struggles a lot more when she does not get a good nights rest.

## 2024-04-18 NOTE — Telephone Encounter (Signed)
 Duplicate message

## 2024-04-18 NOTE — Telephone Encounter (Signed)
>>   Apr 18, 2024  9:29 AM Emylou G wrote: Daughter called.. checking status of refill, her pcp is out of the office.. mom is almost out of the med .SABRA She would like a call back.. number on file is good ( [phone] )   Copied from CRM #[id number]. Topic: Clinical - Medication Refill >> Apr 16, 2024  9:50 AM Kendralyn S wrote: Medication:  QUEtiapine  (SEROQUEL ) 50 MG tablet   Has the patient contacted their pharmacy? Yes (Agent: If no, request that the patient contact the pharmacy for the refill. If patient does not wish to contact the pharmacy document the reason why and proceed with request.) (Agent: If yes, when and what did the pharmacy advise?)  This is the patient's preferred pharmacy:  [id number] [address], [place] - [address] AT [facility] OF [address] & [facility] [address] [place] [address] Phone: [phone] Fax: [phone]  Is this the correct pharmacy for this prescription? Yes If no, delete pharmacy and type the correct one.   Has the prescription been filled recently? No  Is the patient out of the medication? No, 3 left  Has the patient been seen for an appointment in the last year OR does the patient have an upcoming appointment? Yes  Can we respond through MyChart? No  Agent: Please be advised that Rx refills may take up to 3 business days. We ask that you follow-up with your pharmacy.

## 2024-04-18 NOTE — Telephone Encounter (Signed)
 Copied from CRM #[id number]. Topic: Clinical - Medication Refill >> Apr 16, 2024  9:50 AM Kendralyn S wrote: Medication:  QUEtiapine  (SEROQUEL ) 50 MG tablet   Has the patient contacted their pharmacy? Yes (Agent: If no, request that the patient contact the pharmacy for the refill. If patient does not wish to contact the pharmacy document the reason why and proceed with request.) (Agent: If yes, when and what did the pharmacy advise?)  This is the patient's preferred pharmacy:  [id number] [address], [place] - [address] AT [facility] [address] [facility] [address]  [place] [address] Phone: [phone] Fax: [phone]  Is this the correct pharmacy for this prescription? Yes If no, delete pharmacy and type the correct one.   Has the prescription been filled recently? No  Is the patient out of the medication? No, 3 left  Has the patient been seen for an appointment in the last year OR does the patient have an upcoming appointment? Yes  Can we respond through MyChart? No  Agent: Please be advised that Rx refills may take up to 3 business days. We ask that you follow-up with your pharmacy. >> Apr 18, 2024  9:29 AM Emylou G wrote: Daughter called.. checking status of refill, her pcp is out of the office.. mom is almost out of the med .SABRA She would like a call back.. number on file is good [phone] )

## 2024-04-21 MED ORDER — QUETIAPINE FUMARATE 50 MG PO TABS: 50.0000 mg | ORAL_TABLET | Freq: Every day | ORAL | 0 refills | Status: DC

## 2024-04-30 ENCOUNTER — Encounter: Payer: Self-pay | Admitting: Internal Medicine

## 2024-04-30 ENCOUNTER — Ambulatory Visit: Admitting: Internal Medicine

## 2024-04-30 VITALS — BP 134/82 | HR 69 | Temp 97.6°F | Ht 63.0 in | Wt 206.0 lb

## 2024-04-30 DIAGNOSIS — R1030 Lower abdominal pain, unspecified: Secondary | ICD-10-CM | POA: Diagnosis not present

## 2024-04-30 DIAGNOSIS — E7800 Pure hypercholesterolemia, unspecified: Secondary | ICD-10-CM | POA: Diagnosis not present

## 2024-04-30 DIAGNOSIS — F0390 Unspecified dementia without behavioral disturbance: Secondary | ICD-10-CM

## 2024-04-30 DIAGNOSIS — E559 Vitamin D deficiency, unspecified: Secondary | ICD-10-CM

## 2024-04-30 DIAGNOSIS — H04123 Dry eye syndrome of bilateral lacrimal glands: Secondary | ICD-10-CM | POA: Diagnosis not present

## 2024-04-30 DIAGNOSIS — N1831 Chronic kidney disease, stage 3a: Secondary | ICD-10-CM

## 2024-04-30 DIAGNOSIS — L03113 Cellulitis of right upper limb: Secondary | ICD-10-CM | POA: Diagnosis not present

## 2024-04-30 DIAGNOSIS — R739 Hyperglycemia, unspecified: Secondary | ICD-10-CM | POA: Diagnosis not present

## 2024-04-30 DIAGNOSIS — I10 Essential (primary) hypertension: Secondary | ICD-10-CM | POA: Diagnosis not present

## 2024-04-30 DIAGNOSIS — R531 Weakness: Secondary | ICD-10-CM

## 2024-04-30 LAB — HEPATIC FUNCTION PANEL
ALT: 13 U/L (ref 0–35)
AST: 25 U/L (ref 0–37)
Albumin: 4.2 g/dL (ref 3.5–5.2)
Alkaline Phosphatase: 97 U/L (ref 39–117)
Bilirubin, Direct: 0.2 mg/dL (ref 0.0–0.3)
Total Bilirubin: 0.8 mg/dL (ref 0.2–1.2)
Total Protein: 7.9 g/dL (ref 6.0–8.3)

## 2024-04-30 LAB — LIPID PANEL
Cholesterol: 169 mg/dL (ref 0–200)
HDL: 51.9 mg/dL (ref >39.00)
LDL Cholesterol: 96 mg/dL (ref 0–99)
NonHDL: 117.29
Total CHOL/HDL Ratio: 3
Triglycerides: 107 mg/dL (ref 0.0–149.0)
VLDL: 21.4 mg/dL (ref 0.0–40.0)

## 2024-04-30 LAB — CBC WITH DIFFERENTIAL/PLATELET
Basophils Absolute: 0 K/uL (ref 0.0–0.1)
Basophils Relative: 0.6 % (ref 0.0–3.0)
Eosinophils Absolute: 0.1 K/uL (ref 0.0–0.7)
Eosinophils Relative: 0.9 % (ref 0.0–5.0)
HCT: 39.8 % (ref 36.0–46.0)
Hemoglobin: 13.1 g/dL (ref 12.0–15.0)
Lymphocytes Relative: 40.4 % (ref 12.0–46.0)
Lymphs Abs: 2.4 K/uL (ref 0.7–4.0)
MCHC: 33 g/dL (ref 30.0–36.0)
MCV: 93.2 fl (ref 78.0–100.0)
Monocytes Absolute: 0.6 K/uL (ref 0.1–1.0)
Monocytes Relative: 9.9 % (ref 3.0–12.0)
Neutro Abs: 2.9 K/uL (ref 1.4–7.7)
Neutrophils Relative %: 48.2 % (ref 43.0–77.0)
Platelets: 236 K/uL (ref 150.0–400.0)
RBC: 4.27 Mil/uL (ref 3.87–5.11)
RDW: 13.8 % (ref 11.5–15.5)
WBC: 6.1 K/uL (ref 4.0–10.5)

## 2024-04-30 LAB — BASIC METABOLIC PANEL WITH GFR
BUN: 14 mg/dL (ref 6–23)
CO2: 28 meq/L (ref 19–32)
Calcium: 9.3 mg/dL (ref 8.4–10.5)
Chloride: 104 meq/L (ref 96–112)
Creatinine, Ser: 1 mg/dL (ref 0.40–1.20)
GFR: 50.72 mL/min — ABNORMAL LOW (ref >60.00)
Glucose, Bld: 83 mg/dL (ref 70–99)
Potassium: 3.6 meq/L (ref 3.5–5.1)
Sodium: 141 meq/L (ref 135–145)

## 2024-04-30 LAB — VITAMIN D 25 HYDROXY (VIT D DEFICIENCY, FRACTURES): VITD: 24.89 ng/mL — ABNORMAL LOW (ref 30.00–100.00)

## 2024-04-30 LAB — HEMOGLOBIN A1C: Hgb A1c MFr Bld: 7 % — ABNORMAL HIGH (ref 4.6–6.5)

## 2024-04-30 MED ORDER — QUETIAPINE FUMARATE 50 MG PO TABS: 50.0000 mg | ORAL_TABLET | Freq: Every day | ORAL | 1 refills | Status: AC

## 2024-04-30 MED ORDER — LISINOPRIL 40 MG PO TABS: ORAL_TABLET | ORAL | 3 refills | Status: AC

## 2024-04-30 MED ORDER — AMLODIPINE BESYLATE 10 MG PO TABS: 10.0000 mg | ORAL_TABLET | Freq: Every day | ORAL | 3 refills | Status: AC

## 2024-04-30 MED ORDER — ATORVASTATIN CALCIUM 20 MG PO TABS: 20.0000 mg | ORAL_TABLET | Freq: Every day | ORAL | 3 refills | Status: AC

## 2024-04-30 MED ORDER — DOXYCYCLINE HYCLATE 100 MG PO TABS: 100.0000 mg | ORAL_TABLET | Freq: Two times a day (BID) | ORAL | 0 refills | Status: DC

## 2024-04-30 MED ORDER — CITALOPRAM HYDROBROMIDE 20 MG PO TABS: 20.0000 mg | ORAL_TABLET | Freq: Every day | ORAL | 3 refills | Status: AC

## 2024-04-30 MED ORDER — LABETALOL HCL 300 MG PO TABS: ORAL_TABLET | ORAL | 3 refills | Status: AC

## 2024-04-30 NOTE — Patient Instructions (Signed)
 Please take all new medication as prescribed - the antibiotic  Please continue all other medications as before, and refills have been done  Please have the pharmacy call with any other refills you may need.  Please continue your efforts at being more active, low cholesterol diet, and weight control.  You are otherwise up to date with prevention measures today.  Please keep your appointments with your specialists as you may have planned  You will be contacted regarding the referral for: Home Health with RN and PT  Please make an Appointment to return in 6 months, or sooner if needed

## 2024-04-30 NOTE — Progress Notes (Unsigned)
 Patient ID: Teresa Riley, female   DOB: [birth year], 89 y.o.   MRN: [id number]        Chief Complaint: follow up dementia, low vit d, lower mid abd pain, hld, hyperglycemia, htn       HPI:  Teresa Riley is a 89 y.o. female here overall doing ok with family  Dementia overall stable symptomatically, and not assoc with behavioral changes such as hallucinations, paranoia, or agitation. Does have general weakness as well and difficulty ambulating    Does have new onset right 5th finger redness, pain, swelling without fever, chills or red streaks.  Has had mild low mid abd pain off an on, better with BM.  Denies urinary symptoms such as dysuria, frequency, urgency, flank pain, hematuria or n/v, fever, chills.  Has dry eyes and asking for Otc to sue.   Pt denies polydipsia, polyuria, or new focal neuro s/s.         Wt Readings from Last 3 Encounters:  04/30/24 206 lb (93.4 kg)  09/07/23 201 lb 15.1 oz (91.6 kg)  11/08/22 202 lb (91.6 kg)   BP Readings from Last 3 Encounters:  04/30/24 134/82  09/15/23 (!) 140/82  09/07/23 (!) 174/91         Past Medical History:  Diagnosis Date   Allergic rhinitis 09/05/2014   Allergic rhinitis, cause unspecified 10/29/2010   Chronic constipation 10/29/2010   CKD (chronic kidney disease) stage 3, GFR 30-59 ml/min (HCC) 04/28/2017   Eczema 10/29/2010   Gait difficulty    H/O: hysterectomy 10/29/2010   History of cervical cancer 10/29/2010   HTN (hypertension) 10/29/2010   Hyperlipidemia    Hypertension    Impaired glucose tolerance 08/16/2012   Loss of appetite    Memory loss    Orthostatic hypotension 06/05/2012   Osteoporosis    Right hip pain    Seizures (HCC)    Vascular dementia (HCC)    Vitamin D  deficiency 09/05/2014   Past Surgical History:  Procedure Laterality Date   ABDOMINAL HYSTERECTOMY  1970   BREAST BIOPSY  1980's   benign   CATARACT EXTRACTION W/ INTRAOCULAR LENS  IMPLANT, BILATERAL  01/2011   EYE SURGERY      reports that she quit  smoking about 52 years ago. Her smoking use included cigarettes. She has never used smokeless tobacco. She reports that she does not drink alcohol  and does not use drugs. family history includes Diabetes in her father; High blood pressure in her daughter; Hypertension in her father and mother. No Known Allergies Current Outpatient Medications on File Prior to Visit  Medication Sig Dispense Refill   albuterol  (VENTOLIN  HFA) 108 (90 Base) MCG/ACT inhaler Inhale 2 puffs into the lungs every 4 (four) hours as needed for wheezing or shortness of breath. 1 each 0   ALPRAZolam  (XANAX ) 0.25 MG tablet TAKE 1 TABLET(0.25 MG) BY MOUTH TWICE DAILY AS NEEDED 30 tablet 2   NAMZARIC  28-10 MG CP24 TAKE 1 CAPSULE BY MOUTH EVERY MORNING 90 capsule 3   Polyethyl Glycol-Propyl Glycol 0.4-0.3 % SOLN Place 1 drop into both eyes daily as needed (dry eyes).      acetaminophen  (TYLENOL ) 500 MG tablet Take 1,000 mg by mouth every 6 (six) hours as needed for headache or fever (pain). (Patient not taking: Reported on 04/30/2024)     albuterol  (PROVENTIL ) (2.5 MG/3ML) 0.083% nebulizer solution Take 3 mLs (2.5 mg total) by nebulization every 6 (six) hours as needed for wheezing or shortness of breath. (Patient  not taking: Reported on 04/30/2024) 150 mL 1   No current facility-administered medications on file prior to visit.        ROS:  All others reviewed and negative.  Objective        PE:  BP 134/82 (BP Location: Left Arm, Patient Position: Sitting, Cuff Size: Normal)   Pulse 69   Temp 97.6 F (36.4 C) (Oral)   Ht 5' 3 (1.6 m)   Wt 206 lb (93.4 kg)   SpO2 91%   BMI 36.49 kg/m                 Constitutional: Pt appears in NAD               HENT: Head: NCAT.                Right Ear: External ear normal.                 Left Ear: External ear normal.                Eyes: . Pupils are equal, round, and reactive to light. Conjunctivae and EOM are normal               Nose: without d/c or deformity                Neck: Neck supple. Gross normal ROM               Cardiovascular: Normal rate and regular rhythm.                 Pulmonary/Chest: Effort normal and breath sounds without rales or wheezing.                Abd:  Soft, NT, ND, + BS, no organomegaly               Neurological: Pt is alert. At baseline orientation, motor grossly intact               Skin: Skin is warm. No rashes,, LE edema - none, right hand with 5th finger medial dorsal hand 1+ red, tender, swelling               Psychiatric: Pt behavior is normal without agitation   Micro: none  Cardiac tracings I have personally interpreted today:  none  Pertinent Radiological findings (summarize): none   Lab Results  Component Value Date   WBC 6.1 04/30/2024   HGB 13.1 04/30/2024   HCT 39.8 04/30/2024   PLT 236.0 04/30/2024   GLUCOSE 83 04/30/2024   CHOL 169 04/30/2024   TRIG 107.0 04/30/2024   HDL 51.90 04/30/2024   LDLDIRECT 123.0 03/23/2021   LDLCALC 96 04/30/2024   ALT 13 04/30/2024   AST 25 04/30/2024   NA 141 04/30/2024   K 3.6 04/30/2024   CL 104 04/30/2024   CREATININE 1.00 04/30/2024   BUN 14 04/30/2024   CO2 28 04/30/2024   TSH 1.21 03/16/2022   HGBA1C 7.0 (H) 04/30/2024   Assessment/Plan:  Teresa Riley is a 89 y.o. Black or African American [2] female with  has a past medical history of Allergic rhinitis (09/05/2014), Allergic rhinitis, cause unspecified (10/29/2010), Chronic constipation (10/29/2010), CKD (chronic kidney disease) stage 3, GFR 30-59 ml/min (HCC) (04/28/2017), Eczema (10/29/2010), Gait difficulty, H/O: hysterectomy (10/29/2010), History of cervical cancer (10/29/2010), HTN (hypertension) (10/29/2010), Hyperlipidemia, Hypertension, Impaired glucose tolerance (08/16/2012), Loss of appetite, Memory loss, Orthostatic hypotension (06/05/2012), Osteoporosis, Right hip pain,  Seizures (HCC), Vascular dementia (HCC), and Vitamin D  deficiency (09/05/2014).  Dementia (HCC) Stable , cont current med tx  Vitamin D   deficiency Last vitamin D  Lab Results  Component Value Date   VD25OH 24.89 (L) 04/30/2024   Low, to start oral replacement   Lower abdominal pain Exam benign, suspect due to constipation  - for miralax  17 mg qd  Hyperlipidemia Lab Results  Component Value Date   LDLCALC 96 04/30/2024   Stable, pt to continue current statin lipitor 20 mg qd   Hyperglycemia Lab Results  Component Value Date   HGBA1C 7.0 (H) 04/30/2024   Stable, pt to continue current medical treatment  - diet, wt control   HTN (hypertension) BP Readings from Last 3 Encounters:  04/30/24 134/82  09/15/23 (!) 140/82  09/07/23 (!) 174/91   Stable, pt to continue medical treatment norvasc  10 every day, labetolol 300 bid, lisinopril  40 qd    CKD (chronic kidney disease) stage 3, GFR 30-59 ml/min (HCC) Lab Results  Component Value Date   CREATININE 1.00 04/30/2024   Stable overall, cont to avoid nephrotoxins ckd3a  Cellulitis of right hand Mild to mod, for antibx course  doxycycline 100 bid,  to f/u any worsening symptoms or concerns  Generalized weakness Pt also for [facility] with RN and PT  Followup: Return in about 6 months (around 10/29/2024).  Lynwood Rush, MD 05/01/2024 9:26 PM [facility]

## 2024-05-01 ENCOUNTER — Encounter: Payer: Self-pay | Admitting: Internal Medicine

## 2024-05-01 ENCOUNTER — Ambulatory Visit: Payer: Self-pay | Admitting: Internal Medicine

## 2024-05-01 DIAGNOSIS — L03113 Cellulitis of right upper limb: Secondary | ICD-10-CM | POA: Insufficient documentation

## 2024-05-01 DIAGNOSIS — R531 Weakness: Secondary | ICD-10-CM | POA: Insufficient documentation

## 2024-05-01 LAB — URINALYSIS, ROUTINE W REFLEX MICROSCOPIC
Bilirubin Urine: NEGATIVE
Hgb urine dipstick: NEGATIVE
Ketones, ur: NEGATIVE
Leukocytes,Ua: NEGATIVE
Nitrite: NEGATIVE
RBC / HPF: NONE SEEN (ref 0–?)
Specific Gravity, Urine: 1.015 (ref 1.000–1.030)
Total Protein, Urine: NEGATIVE
Urine Glucose: NEGATIVE
Urobilinogen, UA: 0.2 (ref 0.0–1.0)
pH: 7 (ref 5.0–8.0)

## 2024-05-01 NOTE — Assessment & Plan Note (Signed)
 Mild to mod, for antibx course doxycycline 100 bid,  to f/u any worsening symptoms or concerns

## 2024-05-01 NOTE — Assessment & Plan Note (Addendum)
 Exam benign, suspect due to constipation  - for miralax  17 mg qd

## 2024-05-01 NOTE — Assessment & Plan Note (Signed)
 Lab Results  Component Value Date   HGBA1C 7.0 (H) 04/30/2024   Stable, pt to continue current medical treatment  - diet, wt control

## 2024-05-01 NOTE — Assessment & Plan Note (Signed)
 Pt for otc murine eye drops asd

## 2024-05-01 NOTE — Assessment & Plan Note (Signed)
 Pt also for [facility] with RN and PT

## 2024-05-01 NOTE — Assessment & Plan Note (Signed)
Stable, cont current med tx

## 2024-05-01 NOTE — Assessment & Plan Note (Signed)
 Lab Results  Component Value Date   LDLCALC 96 04/30/2024   Stable, pt to continue current statin lipitor 20 mg qd

## 2024-05-01 NOTE — Assessment & Plan Note (Signed)
 Last vitamin D  Lab Results  Component Value Date   VD25OH 24.89 (L) 04/30/2024   Low, to start oral replacement

## 2024-05-01 NOTE — Assessment & Plan Note (Signed)
 BP Readings from Last 3 Encounters:  04/30/24 134/82  09/15/23 (!) 140/82  09/07/23 (!) 174/91   Stable, pt to continue medical treatment norvasc  10 every day, labetolol 300 bid, lisinopril  40 qd

## 2024-05-01 NOTE — Assessment & Plan Note (Signed)
 Lab Results  Component Value Date   CREATININE 1.00 04/30/2024   Stable overall, cont to avoid nephrotoxins ckd3a

## 2024-05-19 ENCOUNTER — Other Ambulatory Visit: Payer: Self-pay | Admitting: Neurology

## 2024-05-21 ENCOUNTER — Telehealth: Payer: Self-pay

## 2024-05-21 NOTE — Telephone Encounter (Signed)
 Copied from CRM [id number]. Topic: Clinical - Prescription Issue >> May 21, 2024 12:12 PM Viola F wrote: Reason for CRM: Patient daughter Jon called regarding the NAMZARIC  28-10 MG CP24 - patient was seen 04/30/24 and the medication was never sent to the pharmacy. Please send to the [facility] [phone] - , [place] - [address]

## 2024-05-22 MED ORDER — MEMANTINE HCL-DONEPEZIL HCL ER 28-10 MG PO CP24: 1.0000 | ORAL_CAPSULE | Freq: Every morning | ORAL | 1 refills | Status: DC

## 2024-05-22 NOTE — Telephone Encounter (Signed)
Ok sure this is done

## 2024-05-22 NOTE — Addendum Note (Signed)
 Addended by: NORLEEN LYNWOOD ORN on: 05/22/2024 12:16 PM   Modules accepted: Orders

## 2024-06-21 ENCOUNTER — Telehealth: Payer: Self-pay | Admitting: Neurology

## 2024-06-21 NOTE — Telephone Encounter (Signed)
 Patient;'s daughter, Jon Ada Williamson(on last DPR 2021) requesting diagnosis code for patient's condition. Applying for CAP and need her diagnosis code. Would like a call back

## 2024-06-25 NOTE — Telephone Encounter (Signed)
 Left message for patient to call.

## 2024-06-26 NOTE — Telephone Encounter (Signed)
 Lvm 2nd attempt by hf 06/26/24

## 2024-07-16 ENCOUNTER — Ambulatory Visit: Payer: Self-pay

## 2024-07-16 DIAGNOSIS — R350 Frequency of micturition: Secondary | ICD-10-CM

## 2024-07-16 NOTE — Telephone Encounter (Signed)
 First attempt made to call patient at 11:04. LM to return call to [phone].

## 2024-07-16 NOTE — Telephone Encounter (Signed)
 FYI Only or Action Required?: Action required by provider: requesting supplies to check for UTI.  Patient was last seen in primary care on 04/30/2024 by Norleen Lynwood ORN, MD.  Called Nurse Triage reporting Urinary Frequency.  Symptoms began several weeks ago.  Interventions attempted: Nothing.  Symptoms are: stable.  Triage Disposition: See Physician Within 24 Hours  Patient/caregiver understands and will follow disposition?: No, wishes to speak with PCP  Reason for Disposition  Urinating more frequently than usual (i.e., frequency) OR new-onset of the feeling of an urgent need to urinate (i.e., urgency)  Answer Assessment - Initial Assessment Questions Patient's daughter does not want her to come in because she doesn't want her to get the flu. Also, she says she has stairs and it is hard for patient to move, making her a fall risk. Daughter would like to get cup to take home to mother and return sample. I told her not sure if we could accommodate that but I would ask office.  1. SYMPTOM: What's the main symptom you're concerned about? (e.g., frequency, incontinence)     frequency 2. ONSET: When did the  frequency  start?     2 weeks 3. PAIN: Is there any pain? If Yes, ask: How bad is it? (Scale: 1-10; mild, moderate, severe)     Denies 4. CAUSE: What do you think is causing the symptoms?     Possible UTI 5. OTHER SYMPTOMS: Do you have any other symptoms? (e.g., blood in urine, fever, flank pain, pain with urination)     Denies  Protocols used: Urinary Symptoms-A-AH

## 2024-07-17 ENCOUNTER — Encounter: Payer: Self-pay | Admitting: Internal Medicine

## 2024-07-17 NOTE — Telephone Encounter (Signed)
 I have ordered the urine testing  Ok for daughter to pick up supplies at the lab and provide specimen from pt later    thanks

## 2024-07-18 ENCOUNTER — Other Ambulatory Visit: Payer: Self-pay

## 2024-07-18 ENCOUNTER — Other Ambulatory Visit: Payer: Self-pay | Admitting: Internal Medicine

## 2024-07-18 ENCOUNTER — Other Ambulatory Visit

## 2024-07-18 ENCOUNTER — Ambulatory Visit: Payer: Self-pay | Admitting: Internal Medicine

## 2024-07-18 DIAGNOSIS — R300 Dysuria: Secondary | ICD-10-CM

## 2024-07-18 LAB — URINALYSIS, ROUTINE W REFLEX MICROSCOPIC
Bilirubin Urine: NEGATIVE
Hgb urine dipstick: NEGATIVE
Ketones, ur: NEGATIVE
Leukocytes,Ua: NEGATIVE
Nitrite: NEGATIVE
RBC / HPF: NONE SEEN
Specific Gravity, Urine: 1.015 (ref 1.000–1.030)
Total Protein, Urine: NEGATIVE
Urine Glucose: NEGATIVE
Urobilinogen, UA: 0.2 (ref 0.0–1.0)
WBC, UA: NONE SEEN
pH: 7 (ref 5.0–8.0)

## 2024-07-19 LAB — URINE CULTURE: Result:: NO GROWTH

## 2024-07-19 MED ORDER — ALPRAZOLAM 0.25 MG PO TABS: ORAL_TABLET | ORAL | 2 refills | Status: AC

## 2024-07-19 NOTE — Telephone Encounter (Signed)
 This has been done.

## 2024-07-25 MED ORDER — MEMANTINE HCL-DONEPEZIL HCL ER 28-10 MG PO CP24: 1.0000 | ORAL_CAPSULE | Freq: Every morning | ORAL | 1 refills | Status: AC

## 2024-07-25 NOTE — Addendum Note (Signed)
 Addended by: NORLEEN LYNWOOD ORN on: 07/25/2024 05:23 PM   Modules accepted: Orders

## 2024-08-01 MED ORDER — HYDRALAZINE HCL 50 MG PO TABS: 50.0000 mg | ORAL_TABLET | Freq: Two times a day (BID) | ORAL | 1 refills | Status: AC

## 2024-08-01 NOTE — Addendum Note (Signed)
 Addended by: NORLEEN LYNWOOD ORN on: 08/01/2024 11:46 AM   Modules accepted: Orders

## 2024-08-12 ENCOUNTER — Emergency Department (HOSPITAL_COMMUNITY)

## 2024-08-12 ENCOUNTER — Emergency Department (HOSPITAL_COMMUNITY)
Admission: EM | Admit: 2024-08-12 | Discharge: 2024-08-14 | Disposition: A | Attending: Emergency Medicine | Admitting: Emergency Medicine

## 2024-08-12 DIAGNOSIS — R296 Repeated falls: Secondary | ICD-10-CM

## 2024-08-12 DIAGNOSIS — W06XXXA Fall from bed, initial encounter: Secondary | ICD-10-CM | POA: Insufficient documentation

## 2024-08-12 DIAGNOSIS — N189 Chronic kidney disease, unspecified: Secondary | ICD-10-CM | POA: Insufficient documentation

## 2024-08-12 DIAGNOSIS — R739 Hyperglycemia, unspecified: Secondary | ICD-10-CM | POA: Insufficient documentation

## 2024-08-12 DIAGNOSIS — F0390 Unspecified dementia without behavioral disturbance: Secondary | ICD-10-CM | POA: Insufficient documentation

## 2024-08-12 DIAGNOSIS — I129 Hypertensive chronic kidney disease with stage 1 through stage 4 chronic kidney disease, or unspecified chronic kidney disease: Secondary | ICD-10-CM | POA: Insufficient documentation

## 2024-08-12 DIAGNOSIS — Z79899 Other long term (current) drug therapy: Secondary | ICD-10-CM | POA: Insufficient documentation

## 2024-08-12 DIAGNOSIS — R410 Disorientation, unspecified: Secondary | ICD-10-CM | POA: Insufficient documentation

## 2024-08-12 DIAGNOSIS — R531 Weakness: Secondary | ICD-10-CM | POA: Insufficient documentation

## 2024-08-12 LAB — CBC WITH DIFFERENTIAL/PLATELET
Abs Immature Granulocytes: 0.02 10*3/uL (ref 0.00–0.07)
Basophils Absolute: 0 10*3/uL (ref 0.0–0.1)
Basophils Relative: 0 %
Eosinophils Absolute: 0.2 10*3/uL (ref 0.0–0.5)
Eosinophils Relative: 2 %
HCT: 38.1 % (ref 36.0–46.0)
Hemoglobin: 12.7 g/dL (ref 12.0–15.0)
Immature Granulocytes: 0 %
Lymphocytes Relative: 22 %
Lymphs Abs: 1.6 10*3/uL (ref 0.7–4.0)
MCH: 31.1 pg (ref 26.0–34.0)
MCHC: 33.3 g/dL (ref 30.0–36.0)
MCV: 93.4 fL (ref 80.0–100.0)
Monocytes Absolute: 0.6 10*3/uL (ref 0.1–1.0)
Monocytes Relative: 9 %
Neutro Abs: 4.8 10*3/uL (ref 1.7–7.7)
Neutrophils Relative %: 67 %
Platelets: 251 10*3/uL (ref 150–400)
RBC: 4.08 MIL/uL (ref 3.87–5.11)
RDW: 13 % (ref 11.5–15.5)
WBC: 7.3 10*3/uL (ref 4.0–10.5)
nRBC: 0 % (ref 0.0–0.2)

## 2024-08-12 LAB — URINALYSIS, W/ REFLEX TO CULTURE (INFECTION SUSPECTED)
Bilirubin Urine: NEGATIVE
Glucose, UA: NEGATIVE mg/dL
Hgb urine dipstick: NEGATIVE
Ketones, ur: NEGATIVE mg/dL
Leukocytes,Ua: NEGATIVE
Nitrite: NEGATIVE
Protein, ur: NEGATIVE mg/dL
Specific Gravity, Urine: 1.019 (ref 1.005–1.030)
pH: 7 (ref 5.0–8.0)

## 2024-08-12 LAB — COMPREHENSIVE METABOLIC PANEL WITH GFR
ALT: 27 U/L (ref 0–44)
AST: 32 U/L (ref 15–41)
Albumin: 3.9 g/dL (ref 3.5–5.0)
Alkaline Phosphatase: 115 U/L (ref 38–126)
Anion gap: 11 (ref 5–15)
BUN: 11 mg/dL (ref 8–23)
CO2: 25 mmol/L (ref 22–32)
Calcium: 9.4 mg/dL (ref 8.9–10.3)
Chloride: 102 mmol/L (ref 98–111)
Creatinine, Ser: 1.02 mg/dL — ABNORMAL HIGH (ref 0.44–1.00)
GFR, Estimated: 53 mL/min — ABNORMAL LOW
Glucose, Bld: 154 mg/dL — ABNORMAL HIGH (ref 70–99)
Potassium: 3.9 mmol/L (ref 3.5–5.1)
Sodium: 138 mmol/L (ref 135–145)
Total Bilirubin: 0.6 mg/dL (ref 0.0–1.2)
Total Protein: 7.8 g/dL (ref 6.5–8.1)

## 2024-08-12 LAB — RESP PANEL BY RT-PCR (RSV, FLU A&B, COVID)  RVPGX2
Influenza A by PCR: NEGATIVE
Influenza B by PCR: NEGATIVE
Resp Syncytial Virus by PCR: NEGATIVE
SARS Coronavirus 2 by RT PCR: NEGATIVE

## 2024-08-12 LAB — LIPASE, BLOOD: Lipase: 17 U/L (ref 11–51)

## 2024-08-12 LAB — CK: Total CK: 413 U/L — ABNORMAL HIGH (ref 38–234)

## 2024-08-12 MED ORDER — LISINOPRIL 20 MG PO TABS
40.0000 mg | ORAL_TABLET | Freq: Every day | ORAL | Status: DC
  Administered 2024-08-12 – 2024-08-14 (×3): 40 mg via ORAL
  Filled 2024-08-12 (×2): qty 2
  Filled 2024-08-12: qty 4

## 2024-08-12 MED ORDER — ALPRAZOLAM 0.25 MG PO TABS: 0.2500 mg | ORAL_TABLET | Freq: Four times a day (QID) | ORAL | Status: DC | PRN

## 2024-08-12 MED ORDER — DONEPEZIL HCL 5 MG PO TABS: 10.0000 mg | ORAL_TABLET | Freq: Every day | ORAL | Status: DC

## 2024-08-12 MED ORDER — POLYVINYL ALCOHOL 1.4 % OP SOLN: 1.0000 [drp] | Freq: Every day | OPHTHALMIC | Status: DC | PRN

## 2024-08-12 MED ORDER — LACTATED RINGERS IV BOLUS
1000.0000 mL | Freq: Once | INTRAVENOUS | Status: AC
  Administered 2024-08-12: 1000 mL via INTRAVENOUS

## 2024-08-12 MED ORDER — CITALOPRAM HYDROBROMIDE 10 MG PO TABS
20.0000 mg | ORAL_TABLET | Freq: Every day | ORAL | Status: DC
  Administered 2024-08-12 – 2024-08-14 (×3): 20 mg via ORAL
  Filled 2024-08-12 (×3): qty 2

## 2024-08-12 MED ORDER — MEMANTINE HCL ER 28 MG PO CP24
28.0000 mg | ORAL_CAPSULE | Freq: Every day | ORAL | Status: DC
  Administered 2024-08-13 – 2024-08-14 (×2): 28 mg via ORAL
  Filled 2024-08-12 (×2): qty 1

## 2024-08-12 MED ORDER — AMLODIPINE BESYLATE 5 MG PO TABS
10.0000 mg | ORAL_TABLET | Freq: Every day | ORAL | Status: DC
  Administered 2024-08-12 – 2024-08-14 (×3): 10 mg via ORAL
  Filled 2024-08-12 (×3): qty 2

## 2024-08-12 MED ORDER — IOHEXOL 300 MG/ML  SOLN
100.0000 mL | Freq: Once | INTRAMUSCULAR | Status: AC | PRN
  Administered 2024-08-12: 100 mL via INTRAVENOUS

## 2024-08-12 MED ORDER — POLYETHYL GLYCOL-PROPYL GLYCOL 0.4-0.3 % OP SOLN: 1.0000 [drp] | Freq: Every day | OPHTHALMIC | Status: DC | PRN

## 2024-08-12 MED ORDER — ATORVASTATIN CALCIUM 10 MG PO TABS
20.0000 mg | ORAL_TABLET | Freq: Every day | ORAL | Status: DC
  Administered 2024-08-12 – 2024-08-14 (×3): 20 mg via ORAL
  Filled 2024-08-12 (×3): qty 2

## 2024-08-12 MED ORDER — MEMANTINE HCL ER 28 MG PO CP24: 28.0000 mg | ORAL_CAPSULE | Freq: Every day | ORAL | Status: DC

## 2024-08-12 MED ORDER — MEMANTINE HCL-DONEPEZIL HCL ER 28-10 MG PO CP24: 1.0000 | ORAL_CAPSULE | Freq: Every morning | ORAL | Status: DC

## 2024-08-12 MED ORDER — QUETIAPINE FUMARATE 50 MG PO TABS
50.0000 mg | ORAL_TABLET | Freq: Every day | ORAL | Status: DC
  Administered 2024-08-12 – 2024-08-13 (×2): 50 mg via ORAL
  Filled 2024-08-12 (×2): qty 1

## 2024-08-12 MED ORDER — IPRATROPIUM-ALBUTEROL 0.5-2.5 (3) MG/3ML IN SOLN: 3.0000 mL | Freq: Once | RESPIRATORY_TRACT | Status: DC

## 2024-08-12 MED ORDER — DONEPEZIL HCL 5 MG PO TABS
10.0000 mg | ORAL_TABLET | Freq: Every day | ORAL | Status: DC
  Administered 2024-08-13 – 2024-08-14 (×2): 10 mg via ORAL
  Filled 2024-08-12 (×2): qty 2

## 2024-08-12 NOTE — ED Provider Notes (Signed)
 " [place] [facility] AT [facility] Provider Note   CSN: [id number] Arrival date & time: 08/12/24  1239     Patient presents with: No chief complaint on file.   Teresa Riley is a 89 y.o. female.   This is an 89 year old female presenting emergency department with fall getting out of bed for the past 3 days.  Not hit her head no LOC.  Not on blood thinner.  Denies any pain from the fall.  Patient with dementia slightly worsened confusion than normal.  Has been lying around in bed more for the past 2 days.  Could not get out of bed this morning.  Typically ambulates with [place].  No infectious symptoms.  Denies chest pain no shortness of breath.  No abdominal pain.  Daughter reports recent check for UTI few weeks ago that was negative.  Started new blood pressure medication.         Prior to Admission medications  Medication Sig Start Date End Date Taking? Authorizing Provider  acetaminophen  (TYLENOL ) 500 MG tablet Take 1,000 mg by mouth every 6 (six) hours as needed for headache or fever (pain). Patient not taking: Reported on 04/30/2024    [provider]  albuterol  (PROVENTIL ) (2.5 MG/3ML) 0.083% nebulizer solution Take 3 mLs (2.5 mg total) by nebulization every 6 (six) hours as needed for wheezing or shortness of breath. Patient not taking: Reported on 04/30/2024 03/29/21   Norleen Lynwood ORN, MD  albuterol  (VENTOLIN  HFA) 108 [phone] Base) MCG/ACT inhaler Inhale 2 puffs into the lungs every 4 (four) hours as needed for wheezing or shortness of breath. 09/07/23   Ula Prentice SAUNDERS, MD  ALPRAZolam  (XANAX ) 0.25 MG tablet TAKE 1 TABLET(0.25 MG) BY MOUTH TWICE DAILY AS NEEDED 07/19/24   Norleen Lynwood ORN, MD  amLODipine  (NORVASC ) 10 MG tablet Take 1 tablet (10 mg total) by mouth daily. 04/30/24   Norleen Lynwood ORN, MD  atorvastatin  (LIPITOR) 20 MG tablet Take 1 tablet (20 mg total) by mouth daily. 04/30/24 04/30/25  Norleen Lynwood ORN, MD  citalopram  (CELEXA ) 20 MG tablet Take 1 tablet  (20 mg total) by mouth daily. 04/30/24   Norleen Lynwood ORN, MD  hydrALAZINE  (APRESOLINE ) 50 MG tablet Take 1 tablet (50 mg total) by mouth 2 (two) times daily. 08/01/24   Norleen Lynwood ORN, MD  labetalol  (NORMODYNE ) 300 MG tablet TAKE 1 TABLET(300 MG) BY MOUTH TWICE DAILY 04/30/24   Norleen Lynwood ORN, MD  lisinopril  (ZESTRIL ) 40 MG tablet TAKE 1 TABLET(40 MG) BY MOUTH DAILY 04/30/24   Norleen Lynwood ORN, MD  Memantine  HCl-Donepezil  HCl ER 28-10 MG CP24 Take 1 capsule by mouth every morning. 07/25/24   Norleen Lynwood ORN, MD  Polyethyl Glycol-Propyl Glycol 0.4-0.3 % SOLN Place 1 drop into both eyes daily as needed (dry eyes).     [provider]  QUEtiapine  (SEROQUEL ) 50 MG tablet Take 1 tablet (50 mg total) by mouth at bedtime. 04/30/24   Norleen Lynwood ORN, MD    Allergies: Patient has no known allergies.    Review of Systems  Updated Vital Signs BP (!) 170/77   Pulse 81   Temp 98.9 F (37.2 C) (Oral)   Resp 18   SpO2 93%   Physical Exam Vitals and nursing note reviewed.  Constitutional:      General: She is not in acute distress.    Appearance: She is not ill-appearing or toxic-appearing.  HENT:     Head: Normocephalic and atraumatic.  Nose: Nose normal.     Mouth/Throat:     Mouth: Mucous membranes are moist.  Eyes:     Conjunctiva/sclera: Conjunctivae normal.     Pupils: Pupils are equal, round, and reactive to light.  Cardiovascular:     Rate and Rhythm: Normal rate and regular rhythm.  Pulmonary:     Effort: Pulmonary effort is normal.     Breath sounds: Normal breath sounds. No wheezing, rhonchi or rales.  Abdominal:     General: Abdomen is flat. There is no distension.     Palpations: Abdomen is soft.     Tenderness: There is no abdominal tenderness. There is no guarding or rebound.  Musculoskeletal:        General: Normal range of motion.     Cervical back: Normal range of motion. No tenderness.  Skin:    General: Skin is warm and dry.     Capillary Refill: Capillary refill  takes less than 2 seconds.  Neurological:     Mental Status: She is alert and oriented to person, place, and time.  Psychiatric:        Mood and Affect: Mood normal.        Behavior: Behavior normal.     (all labs ordered are listed, but only abnormal results are displayed) Labs Reviewed  COMPREHENSIVE METABOLIC PANEL WITH GFR - Abnormal; Notable for the following components:      Result Value   Glucose, Bld 154 (*)    Creatinine, Ser 1.02 (*)    GFR, Estimated 53 (*)    All other components within normal limits  URINALYSIS, W/ REFLEX TO CULTURE (INFECTION SUSPECTED) - Abnormal; Notable for the following components:   APPearance HAZY (*)    Bacteria, UA RARE (*)    All other components within normal limits  CK - Abnormal; Notable for the following components:   Total CK 413 (*)    All other components within normal limits  RESP PANEL BY RT-PCR (RSV, FLU A&B, COVID)  [id number]  CBC WITH DIFFERENTIAL/PLATELET  LIPASE, BLOOD    EKG: None  Radiology: CT ABDOMEN PELVIS W CONTRAST Result Date: 08/12/2024 CLINICAL DATA:  Suspected bowel obstruction. EXAM: CT ABDOMEN AND PELVIS WITH CONTRAST TECHNIQUE: Multidetector CT imaging of the abdomen and pelvis was performed using the standard protocol following bolus administration of intravenous contrast. RADIATION DOSE REDUCTION: This exam was performed according to the departmental dose-optimization program which includes automated exposure control, adjustment of the mA and/or kV according to patient size and/or use of iterative reconstruction technique. CONTRAST:  OMNIPAQUE  IOHEXOL  300 MG/ML  SOLN COMPARISON:  None Available. FINDINGS: Lower chest: Mild atelectatic changes are seen within the bilateral lung bases. Hepatobiliary: There is diffuse fatty infiltration of the liver parenchyma. A 5 mm focus of parenchymal low attenuation is seen within the posteromedial aspect of the right lobe of the liver. No gallstones, gallbladder wall  thickening, or biliary dilatation. Pancreas: Unremarkable. No pancreatic ductal dilatation or surrounding inflammatory changes. Spleen: Normal in size without focal abnormality. Adrenals/Urinary Tract: Adrenal glands are unremarkable. Kidneys are normal in size, without obstructing renal calculi or hydronephrosis. A 3 mm nonobstructing renal calculus is present within the upper pole of the left kidney. Numerous large simple cysts of various sizes are seen within both kidneys. A 3.0 cm slightly heterogeneous cyst is seen within the anterior aspect of the upper pole of the right kidney (approximately 73.25 Hounsfield units). The urinary bladder is moderately to markedly distended and is otherwise unremarkable.  Stomach/Bowel: There is a moderate to large hiatal hernia. A prominent, mildly elongated, fluid-filled cecum is seen involving the region below the ileocecal valve (axial CT images 60 through 70, CT series 4), without clear identification of the appendix. The remaining visualized loops of large and small bowel are unremarkable, with stool present throughout the colon. Vascular/Lymphatic: Aortic atherosclerosis. No enlarged abdominal or pelvic lymph nodes. Reproductive: Status post hysterectomy. No adnexal masses. Other: No abdominal wall hernia or abnormality. No abdominopelvic ascites. Musculoskeletal: The osseous structures are diffusely mottled in appearance with multilevel degenerative changes present throughout the lumbar spine. This is most prominent at the level of L4-L5 IMPRESSION: 1. Prominent, mildly elongated, fluid-filled cecum, without clear identification of the appendix. Correlation with follow-up abdomen and pelvis CT with oral contrast is recommended if appendicitis is of clinical concern. 2. Moderate to large hiatal hernia. 3. Numerous large simple cysts within both kidneys. 4. 3.0 cm slightly heterogeneous cyst within the upper pole of the right kidney which may be hemorrhagic or proteinaceous  in nature. Correlation with nonemergent renal ultrasound is recommended. 5. 3 mm nonobstructing left renal calculus. 6. Diffusely mottled appearance of the osseous structures which may be metabolic in origin. Correlation with a nuclear medicine bone scan is recommended as sequelae associated with an underlying neoplastic process cannot be excluded. 7. Aortic atherosclerosis. Electronically Signed   By: Suzen Dials M.D.   On: 08/12/2024 14:41   CT Head Wo Contrast Result Date: 08/12/2024 EXAM: CT HEAD WITHOUT CONTRAST 08/12/2024 02:06:00 PM TECHNIQUE: CT of the head was performed without the administration of intravenous contrast. Automated exposure control, iterative reconstruction, and/or weight based adjustment of the mA/kV was utilized to reduce the radiation dose to as low as reasonably achievable. COMPARISON: Comparison is made to the prior study dated 05/25/2021. CLINICAL HISTORY: Mental status change, unknown cause. FINDINGS: BRAIN AND VENTRICLES: Generalized atrophy. Moderate to advanced chronic small vessel ischemia. Extensive white matter hypoattenuation demonstrates some progression since the prior study. Ventricles measure 5.3 cm in transverse diameter at the level of the foramen of Monro, increased from 4.8 cm on the prior study. Atherosclerotic calcifications are present in the cavernous carotid arteries bilaterally and at the dural margin of both vertebral arteries. No hyperdense vessel is present. No acute hemorrhage. No evidence of acute infarct. No hydrocephalus. No extra-axial collection. No mass effect or midline shift. ORBITS: No acute abnormality. SINUSES: No acute abnormality. SOFT TISSUES AND SKULL: No acute soft tissue abnormality. No skull fracture. IMPRESSION: 1. No acute intracranial abnormality. 2. Mild interval increase in ventriculomegaly compared with the prior study. Findings are concerning for normal pressure hydrocephalus. 3. Generalized atrophy and moderate to advanced  chronic small vessel ischemic change with progression of extensive white matter hypoattenuation since the prior study. This most likely reflects the sequelae of chronic microvascular ischemia. Electronically signed by: Lonni Necessary MD 08/12/2024 02:21 PM EST RP Workstation: [id number]   DG Chest 2 View Result Date: 08/12/2024 CLINICAL DATA:  Weakness with fall.  Concern for infection. EXAM: CHEST - 2 VIEW COMPARISON:  09/07/2023. FINDINGS: Heart is mildly enlarged and the mediastinal contour is within normal limits. There is atherosclerotic calcification of the aorta. Lung volumes are low with mild atelectasis at the lung bases. No effusion or pneumothorax is seen. No acute osseous abnormality. IMPRESSION: Low lung volumes with atelectasis at the lung bases. Electronically Signed   By: Leita Birmingham M.D.   On: 08/12/2024 13:24     Procedures   Medications Ordered in the ED  amLODipine  (NORVASC ) tablet 10 mg (has no administration in time range)  ALPRAZolam  (XANAX ) tablet 0.25 mg (has no administration in time range)  atorvastatin  (LIPITOR) tablet 20 mg (has no administration in time range)  citalopram  (CELEXA ) tablet 20 mg (has no administration in time range)  Memantine  HCl-Donepezil  HCl ER 28-10 MG CP24 1 capsule (has no administration in time range)  QUEtiapine  (SEROQUEL ) tablet 50 mg (has no administration in time range)  Polyethyl Glycol-Propyl Glycol 0.4-0.3 % SOLN 1 drop (has no administration in time range)  lisinopril  (ZESTRIL ) tablet 40 mg (has no administration in time range)  lactated ringers  bolus 1,000 mL (1,000 mLs Intravenous New Bag/Given 08/12/24 1304)  iohexol  (OMNIPAQUE ) 300 MG/ML solution 100 mL (100 mLs Intravenous Contrast Given 08/12/24 1351)    Clinical Course as of 08/12/24 1511  Sun Aug 12, 2024  1316 CBC with Differential No leukocytosis to suggest systemic infection.  No anemia that would explain her weakness [TY]  1339 Comprehensive metabolic panel(!) No  significant metabolic derangements.  Hyperglycemic, but not in DKA.  No transaminitis or elevated bilirubin to suggest acute hepatobiliary disease.  No obvious evidence of traumatic injury from her falls either. [TY]  1340 Lipase: 17 Pancreatitis unlikely [TY]  1340 CK Total(!): 413 Mild elevation.  Receiving fluids [TY]  1340 DG Chest 2 View IMPRESSION: Low lung volumes with atelectasis at the lung bases.   Electronically Signed   By: Leita Birmingham M.D.   On: 08/12/2024 13:24   [TY]  1444 CT ABDOMEN PELVIS W CONTRAST MPRESSION: 1. Prominent, mildly elongated, fluid-filled cecum, without clear identification of the appendix. Correlation with follow-up abdomen and pelvis CT with oral contrast is recommended if appendicitis is of clinical concern. 2. Moderate to large hiatal hernia. 3. Numerous large simple cysts within both kidneys. 4. 3.0 cm slightly heterogeneous cyst within the upper pole of the right kidney which may be hemorrhagic or proteinaceous in nature. Correlation with nonemergent renal ultrasound is recommended. 5. 3 mm nonobstructing left renal calculus. 6. Diffusely mottled appearance of the osseous structures which may be metabolic in origin. Correlation with a nuclear medicine bone scan is recommended as sequelae associated with an underlying neoplastic process cannot be excluded. 7. Aortic atherosclerosis.   Electronically Signed   By: Suzen Dials M.D.   On: 08/12/2024 14:41   [TY]  1448 Discussed CT head findings with possible normal pressure hydrocephalus with Neurology. Nothing to do and not likely responsible for her generalized weakness. Can be further worked up outpatient with PCP/NSGY.  [TY]  1502 Urinalysis, w/ Reflex to Culture (Infection Suspected) -Urine, Clean Catch(!) Does not appear to have a urinary tract infection [TY]  1502 Discussed findings with daughter at bedside.  Reports that mother is at her cognitive baseline.  No overt cause of  her generalized weakness today.  Does not feel that she can care for her mother and her current condition.  Will contact TOC for possible placement options. [TY]    Clinical Course User Index [TY] Neysa Caron PARAS, DO                                 Medical Decision Making Is an 89 year old female history of hypertension, dementia, seizures, CKD who presents emergency department for evaluation of generalized weakness as well as falls.  Patient is alert to person place, but otherwise with some confusion; near baseline per daughter in room. Patient without specific complaint or  pain and feels fine lying in the bed. Daughter at bedside and provided most of the HPI.  Unclear etiology for symptoms; trauma versus infectious versus metabolic.  Will get CT scans given her falls.  Will get screening labs and give IV fluids ordered. See ED course for further MDM and final disposition.  Amount and/or Complexity of Data Reviewed External Data Reviewed:     Details: Normal EF in 2014 Labs: ordered. Decision-making details documented in ED Course. Radiology: ordered and independent interpretation performed. Decision-making details documented in ED Course.    Details: Do not appreciate obvious intracranial hemorrhage on CT head ECG/medicine tests: ordered and independent interpretation performed.  Risk OTC drugs. Prescription drug management.       Final diagnoses:  None    ED Discharge Orders     None          Neysa Caron PARAS, DO 08/12/24 1511  "

## 2024-08-12 NOTE — Discharge Instructions (Addendum)
 Please follow-up with your primary doctor.  There was some findings on your CT scan concerning for normal pressure hydrocephalus on your CT of your head.  He also had some kidney cysts that will need ultrasounds done at some point and the radiologist commented on abnormal appearance of your lumbar spine and recommending PET scan to exclude an underlying cancer.

## 2024-08-12 NOTE — Progress Notes (Signed)
 ICM consulted for SNF.CSW spoke with pt's daughter to explain SNF process. Daughter verbalized understanding. ICM following for PT eval.   Per daughter, pt has a [facility] at home but she may be interested in a hospital bed due to pt's difficulty getting in and out of the bed/recent multiple falls. CSW advised that if SNF is recommended, she will work with them to obtain order for DME at the end of rehab or begin the process with pt's PCP. Daughter verbalized understanding.

## 2024-08-12 NOTE — ED Triage Notes (Signed)
 Patient BIB EMS from home with complaints of fall. No injury with fall, No LOC, no blood thinners. Three falls since yesterday per daughter. Increased weakness and confusion, hx of Dementia. Daughter at bedside.   190/102 99% CBG 140 20g L AC

## 2024-08-13 MED ORDER — HYDRALAZINE HCL 25 MG PO TABS
50.0000 mg | ORAL_TABLET | Freq: Two times a day (BID) | ORAL | Status: DC
  Administered 2024-08-13 – 2024-08-14 (×3): 50 mg via ORAL
  Filled 2024-08-13 (×3): qty 2

## 2024-08-13 MED ORDER — ASPIRIN 81 MG PO TBEC
81.0000 mg | DELAYED_RELEASE_TABLET | Freq: Every day | ORAL | Status: DC
  Administered 2024-08-13 – 2024-08-14 (×2): 81 mg via ORAL
  Filled 2024-08-13 (×2): qty 1

## 2024-08-13 NOTE — Progress Notes (Signed)
 Awaiting PT eval.

## 2024-08-13 NOTE — Progress Notes (Signed)
 30 Day PASRR Note   Patient Details  Name: Teresa Riley Date of Birth: October 20, 1934   Transition of Care [facility]) CM/SW Contact:    Kari JONETTA Daisy, LCSW Phone Number: 08/13/2024, 10:46 AM  To Whom It May Concern:  Please be advised that this patient will require a short-term nursing home stay - anticipated 30 days or less for rehabilitation and strengthening.   The plan is for return home.

## 2024-08-13 NOTE — Progress Notes (Signed)
 [facility] Liaison Note  Received telephone call from daughter directly to [facility] inquiring about services.  Patient was evaluated in the emergency department for rehab at a skilled nursing facility. [facility] will follow patient for outpatient palliative services after discharge.  Please call with any hospice or outpatient palliative care related questions.  Thank you for the opportunity to participate in this patient's care.  Inocente Jacobs RN BSN [facility] Liaison [phone]

## 2024-08-13 NOTE — ED Notes (Signed)
 Patient has been alert and oriented this shift.   Patient medication compliant. Cooperative with care and staff. Confusion noted. Patient needs help with ADLs.

## 2024-08-13 NOTE — [place] FL2 (Signed)
 " [place]  MEDICAID FL2 LEVEL OF CARE FORM     IDENTIFICATION  Patient Name: Teresa Riley Birthdate: 1934-09-05 Sex: female Admission Date (Current Location): 08/12/2024  [facility] [place] and [place] Number:  Producer, Television/film/video and Address:  [facility],  [address], [place] [address]      Provider Number: [id number]  Attending Physician Name and Address:  Patsey Lot, MD  Relative Name and Phone Number:  Billy Slough  Daughter, Emergency Contact  [phone] [facility] Phone)    Current Level of Care: Hospital Recommended Level of Care: Skilled Nursing Facility Prior Approval Number:    Date Approved/Denied:   PASRR Number: PENDING  Discharge Plan: SNF    Current Diagnoses: Patient Active Problem List   Diagnosis Date Noted   Cellulitis of right hand 05/01/2024   Generalized weakness 05/01/2024   Lower abdominal pain 04/30/2024   Urinary frequency 09/18/2023   Chronic obstructive pulmonary disease (HCC) 11/08/2022   Scalp lesion 03/17/2022   Tendonitis 03/17/2022   Dyspnea 03/16/2022   Moderate dementia without behavioral disturbance, psychotic disturbance, mood disturbance, or anxiety (HCC) 07/30/2021   Gait abnormality 07/30/2021   Acute respiratory failure due to COVID-19 (HCC) 05/26/2021   COVID-19 05/25/2021   Right knee pain 11/12/2019   Pain and swelling of right lower leg 11/12/2019   Weight gain 10/17/2019   Wheezing 10/17/2019   Acute dysfunction of left eustachian tube 01/16/2019   Hyperglycemia 01/16/2019   CKD (chronic kidney disease) stage 3, GFR 30-59 ml/min (HCC) 04/28/2017   Hemorrhoids 04/28/2017   Bilateral impacted cerumen 12/01/2016   Bilateral hearing loss 11/06/2016   Headache 11/05/2016   Thrush 11/05/2016   Paresthesia 09/27/2016   Vertigo 07/04/2016   Urinary retention 06/29/2016   Gait disorder 06/29/2016   General weakness 09/16/2015   Seizures (HCC) 08/27/2015   Dry eyes 05/27/2015   Dry skin 03/12/2015    Rash and nonspecific skin eruption 03/12/2015   Esophageal dysmotility 01/15/2015   Colon cancer screening - declined 01/15/2015   Syncope and collapse 10/03/2014   Constipation 09/05/2014   Encounter for well adult exam with abnormal findings 09/05/2014   Osteoporosis 09/05/2014   Vitamin D  deficiency 09/05/2014   Allergic rhinitis 09/05/2014   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   Anxiety state 09/05/2014   Insomnia 10/02/2013   Weight loss 04/04/2013   Localization-related focal epilepsy with simple partial seizures (HCC) 11/15/2012   Dementia (HCC) 11/15/2012   Mild cognitive disorder 11/15/2012   Hyperlipidemia 08/06/2012   HTN (hypertension) 10/29/2010   Eczema 10/29/2010   History of cervical cancer 10/29/2010    Orientation RESPIRATION BLADDER Height & Weight     Self  Normal Incontinent Weight:   Height:     BEHAVIORAL SYMPTOMS/MOOD NEUROLOGICAL BOWEL NUTRITION STATUS      Incontinent Diet (Regular)  AMBULATORY STATUS COMMUNICATION OF NEEDS Skin   Extensive Assist Verbally Normal                       Personal Care Assistance Level of Assistance  Bathing, Feeding, Dressing Bathing Assistance: Maximum assistance Feeding assistance: Limited assistance Dressing Assistance: Maximum assistance     Functional Limitations Info  Sight, Hearing, Speech Sight Info: Adequate Hearing Info: Adequate Speech Info: Adequate    SPECIAL CARE FACTORS FREQUENCY  PT (By licensed PT), OT (By licensed OT)     PT Frequency: x5/week OT Frequency: x5/week            Contractures Contractures Info: Not present  Additional Factors Info  Code Status, Allergies Code Status Info: Full Allergies Info: No Known Allergies           Current Medications (08/13/2024):  This is the current hospital active medication list Current Facility-Administered Medications  Medication Dose Route Frequency Provider Last Rate Last Admin   amLODipine  (NORVASC ) tablet 10 mg  10 mg Oral  Daily Young, Travis J, DO   10 mg at 08/13/24 9061   artificial tears ophthalmic solution 1 drop  1 drop Both Eyes Daily PRN Neysa Caron PARAS, DO       aspirin  EC tablet 81 mg  81 mg Oral Daily Patsey Lot, MD       atorvastatin  (LIPITOR) tablet 20 mg  20 mg Oral Daily Young, Travis J, DO   20 mg at 08/13/24 9061   citalopram  (CELEXA ) tablet 20 mg  20 mg Oral Daily Young, Travis J, DO   20 mg at 08/13/24 9061   memantine  (NAMENDA  XR) 24 hr capsule 28 mg  28 mg Oral Daily Dasie Faden, MD   28 mg at 08/13/24 0940   And   donepezil  (ARICEPT ) tablet 10 mg  10 mg Oral Daily Dasie Faden, MD   10 mg at 08/13/24 9061   hydrALAZINE  (APRESOLINE ) tablet 50 mg  50 mg Oral BID Patsey Lot, MD       lisinopril  (ZESTRIL ) tablet 40 mg  40 mg Oral Daily Young, Travis J, DO   40 mg at 08/13/24 9061   QUEtiapine  (SEROQUEL ) tablet 50 mg  50 mg Oral QHS Young, Travis J, DO   50 mg at 08/12/24 2137   Current Outpatient Medications  Medication Sig Dispense Refill   acetaminophen  (TYLENOL ) 500 MG tablet Take 500-1,000 mg by mouth every 8 (eight) hours as needed for headache or fever (or pain).     albuterol  (VENTOLIN  HFA) 108 (90 Base) MCG/ACT inhaler Inhale 2 puffs into the lungs every 4 (four) hours as needed for wheezing or shortness of breath. 1 each 0   ALPRAZolam  (XANAX ) 0.25 MG tablet TAKE 1 TABLET(0.25 MG) BY MOUTH TWICE DAILY AS NEEDED (Patient taking differently: Take 0.125 mg by mouth at bedtime.) 30 tablet 2   amLODipine  (NORVASC ) 10 MG tablet Take 1 tablet (10 mg total) by mouth daily. (Patient taking differently: Take 10 mg by mouth at bedtime.) 90 tablet 3   aspirin  EC 81 MG tablet Take 81 mg by mouth daily. Swallow whole.     atorvastatin  (LIPITOR) 20 MG tablet Take 1 tablet (20 mg total) by mouth daily. (Patient taking differently: Take 20 mg by mouth at bedtime.) 90 tablet 3   citalopram  (CELEXA ) 20 MG tablet Take 1 tablet (20 mg total) by mouth daily. 90 tablet 3   hydrALAZINE   (APRESOLINE ) 50 MG tablet Take 1 tablet (50 mg total) by mouth 2 (two) times daily. (Patient taking differently: Take 50 mg by mouth in the morning and at bedtime.) 180 tablet 1   labetalol  (NORMODYNE ) 300 MG tablet TAKE 1 TABLET(300 MG) BY MOUTH TWICE DAILY (Patient taking differently: Take 300 mg by mouth at bedtime.) 180 tablet 3   lisinopril  (ZESTRIL ) 40 MG tablet TAKE 1 TABLET(40 MG) BY MOUTH DAILY (Patient taking differently: Take 40 mg by mouth in the morning.) 90 tablet 3   Memantine  HCl-Donepezil  HCl ER 28-10 MG CP24 Take 1 capsule by mouth every morning. 90 capsule 1   Polyethyl Glycol-Propyl Glycol 0.4-0.3 % SOLN Place 1 drop into both eyes 3 (three) times daily as  needed (for dryness).     QUEtiapine  (SEROQUEL ) 50 MG tablet Take 1 tablet (50 mg total) by mouth at bedtime. 90 tablet 1   albuterol  (PROVENTIL ) (2.5 MG/3ML) 0.083% nebulizer solution Take 3 mLs (2.5 mg total) by nebulization every 6 (six) hours as needed for wheezing or shortness of breath. (Patient not taking: Reported on 08/12/2024) 150 mL 1     Discharge Medications: Please see discharge summary for a list of discharge medications.  Relevant Imaging Results:  Relevant Lab Results:   Additional Information SSN: [id number]  Kari JONETTA Daisy, LCSW     "

## 2024-08-13 NOTE — Progress Notes (Addendum)
 PASRR: [id number]  Addend @ 11:02AM Pt faxed out. Bed offers pending.  Addend @ 11:30AM Offers presented to daughter who will review and inform this clinical research associate of selection.  Addend @ 12:02PM Daughter chose [facility]. Nikki notified. Auth started.

## 2024-08-13 NOTE — Progress Notes (Signed)
 TOC Dementia Note   Patient Details  Name: Teresa Riley Date of Birth: 1934-12-07 08/13/2024, 10:45 AM   To Whom It May Concern:  Please be advised that the above-named patient has a primary diagnosis of dementia which supersedes any psychiatric diagnosis.   Transition of Care (TOC) CM/SW Contact: Kari JONETTA Daisy, LCSW Phone Number: 08/13/2024, 10:45 AM

## 2024-08-14 NOTE — ED Notes (Signed)
 Attempted to call report to [facility] 1st attempt, no answer.

## 2024-08-14 NOTE — ED Provider Notes (Addendum)
 Emergency Medicine Observation Re-evaluation Note  Teresa Riley is a 89 y.o. female, seen on rounds today.  Pt initially presented to the ED for complaints of No chief complaint on file. Currently, the patient is resting.  Physical Exam  BP (!) 171/78 (BP Location: Left Arm)   Pulse 88   Temp 99.9 F (37.7 C)   Resp 18   SpO2 95%  Physical Exam General: nad Cardiac: rr Lungs: non labored Psych: calm  ED Course / MDM  EKG:EKG Interpretation Date/Time:  Sunday August 12 2024 16:36:48 EST Ventricular Rate:  83 PR Interval:  188 QRS Duration:  87 QT Interval:  408 QTC Calculation: 480 R Axis:   -3  Text Interpretation: Sinus rhythm Confirmed by Ruthe Cornet [phone]) on 08/13/2024 11:03:25 AM  I have reviewed the labs performed to date as well as medications administered while in observation.  Recent changes in the last 24 hours include none.  Plan  Current plan is for [facility] placement for SNF/Rehab.    Neysa Caron PARAS, DO 08/14/24 9173    Neysa Caron PARAS, DO 08/14/24 [phone]

## 2024-08-14 NOTE — ED Notes (Signed)
[facility] en route.  

## 2024-08-14 NOTE — Progress Notes (Addendum)
 Auth still pending.  Addend @ 10:10AM Auth approved. Nikki at [facility] aware. Care team notified via secure chat. Daughter aware and will complete paperwork. [facility] to transport.

## 2024-08-14 NOTE — ED Notes (Signed)
 Pt was checked by NT and EMT-P, purewick still functioning, no urine or BM noted in brief.

## 2024-08-15 ENCOUNTER — Telehealth: Payer: Self-pay | Admitting: Neurology

## 2024-08-15 NOTE — Telephone Encounter (Signed)
 Patient's daughter, Jon Slade last 08/08/24, Wednesday patient fell off the bed, called 911. Decided not to take her to the ED vital signs were good. The 08/09/24, Thursday, patient did the same thing rolled off the bed. Called 911, all vital signs were still good; did go to ED. 08/10/24,Friday, patient misjudged her bottom and got too close to the edge of the chair and fell. Called 911, EMS picked her up, still did not take patient to the ED. Saturday, went to mother's room to check on her and she had urinated in the bed (that's not normal). Ask her to get up out of the bed and she was too weak to get up, called 911. Patient was transportation to [facility] ED.   Informed Ms. Slade would need a referral due to have not been seen in 3 years and for new symptoms. Ms. Slade said mother's doctor has left, but will try to get a referral.

## 2024-08-16 ENCOUNTER — Other Ambulatory Visit: Payer: Self-pay

## 2024-08-16 ENCOUNTER — Emergency Department (HOSPITAL_COMMUNITY)

## 2024-08-16 ENCOUNTER — Telehealth: Payer: Self-pay

## 2024-08-16 ENCOUNTER — Emergency Department (HOSPITAL_COMMUNITY)
Admission: EM | Admit: 2024-08-16 | Discharge: 2024-08-17 | Disposition: A | Discharge status: Skilled Nursing Facility | Source: Home / Self Care | Attending: Emergency Medicine | Admitting: Emergency Medicine

## 2024-08-16 ENCOUNTER — Encounter (HOSPITAL_COMMUNITY): Payer: Self-pay

## 2024-08-16 DIAGNOSIS — R531 Weakness: Secondary | ICD-10-CM

## 2024-08-16 LAB — URINALYSIS, ROUTINE W REFLEX MICROSCOPIC
Bacteria, UA: NONE SEEN
Bilirubin Urine: NEGATIVE
Glucose, UA: NEGATIVE mg/dL
Hgb urine dipstick: NEGATIVE
Ketones, ur: NEGATIVE mg/dL
Leukocytes,Ua: NEGATIVE
Nitrite: NEGATIVE
Protein, ur: 30 mg/dL — AB
Specific Gravity, Urine: 1.024 (ref 1.005–1.030)
pH: 5 (ref 5.0–8.0)

## 2024-08-16 LAB — CBC WITH DIFFERENTIAL/PLATELET
Abs Immature Granulocytes: 0.03 10*3/uL (ref 0.00–0.07)
Basophils Absolute: 0 10*3/uL (ref 0.0–0.1)
Basophils Relative: 1 %
Eosinophils Absolute: 0.2 10*3/uL (ref 0.0–0.5)
Eosinophils Relative: 3 %
HCT: 38.2 % (ref 36.0–46.0)
Hemoglobin: 12.6 g/dL (ref 12.0–15.0)
Immature Granulocytes: 0 %
Lymphocytes Relative: 20 %
Lymphs Abs: 1.5 10*3/uL (ref 0.7–4.0)
MCH: 31.2 pg (ref 26.0–34.0)
MCHC: 33 g/dL (ref 30.0–36.0)
MCV: 94.6 fL (ref 80.0–100.0)
Monocytes Absolute: 0.9 10*3/uL (ref 0.1–1.0)
Monocytes Relative: 12 %
Neutro Abs: 4.7 10*3/uL (ref 1.7–7.7)
Neutrophils Relative %: 64 %
Platelets: 316 10*3/uL (ref 150–400)
RBC: 4.04 MIL/uL (ref 3.87–5.11)
RDW: 13 % (ref 11.5–15.5)
WBC: 7.4 10*3/uL (ref 4.0–10.5)
nRBC: 0 % (ref 0.0–0.2)

## 2024-08-16 LAB — COMPREHENSIVE METABOLIC PANEL WITH GFR
ALT: 21 U/L (ref 0–44)
AST: 29 U/L (ref 15–41)
Albumin: 3.8 g/dL (ref 3.5–5.0)
Alkaline Phosphatase: 96 U/L (ref 38–126)
Anion gap: 14 (ref 5–15)
BUN: 23 mg/dL (ref 8–23)
CO2: 22 mmol/L (ref 22–32)
Calcium: 9.2 mg/dL (ref 8.9–10.3)
Chloride: 100 mmol/L (ref 98–111)
Creatinine, Ser: 1.19 mg/dL — ABNORMAL HIGH (ref 0.44–1.00)
GFR, Estimated: 44 mL/min — ABNORMAL LOW
Glucose, Bld: 119 mg/dL — ABNORMAL HIGH (ref 70–99)
Potassium: 4.3 mmol/L (ref 3.5–5.1)
Sodium: 136 mmol/L (ref 135–145)
Total Bilirubin: 0.7 mg/dL (ref 0.0–1.2)
Total Protein: 7.7 g/dL (ref 6.5–8.1)

## 2024-08-16 LAB — TROPONIN T, HIGH SENSITIVITY
Troponin T High Sensitivity: 20 ng/L — ABNORMAL HIGH (ref 0–19)
Troponin T High Sensitivity: 22 ng/L — ABNORMAL HIGH (ref 0–19)

## 2024-08-16 LAB — PROTIME-INR
INR: 1.1 (ref 0.8–1.2)
Prothrombin Time: 14.7 s (ref 11.4–15.2)

## 2024-08-16 LAB — LIPASE, BLOOD: Lipase: 23 U/L (ref 11–51)

## 2024-08-16 MED ORDER — ACETAMINOPHEN 500 MG PO TABS: 500.0000 mg | ORAL_TABLET | Freq: Three times a day (TID) | ORAL | Status: DC | PRN

## 2024-08-16 MED ORDER — OLANZAPINE 5 MG PO TBDP
5.0000 mg | ORAL_TABLET | Freq: Once | ORAL | Status: AC
  Administered 2024-08-16: 5 mg via ORAL
  Filled 2024-08-16: qty 1

## 2024-08-16 MED ORDER — IOHEXOL 350 MG/ML SOLN
75.0000 mL | Freq: Once | INTRAVENOUS | Status: AC | PRN
  Administered 2024-08-16: 75 mL via INTRAVENOUS

## 2024-08-16 MED ORDER — GADOBUTROL 1 MMOL/ML IV SOLN
9.5000 mL | Freq: Once | INTRAVENOUS | Status: AC | PRN
  Administered 2024-08-16: 9.5 mL via INTRAVENOUS

## 2024-08-16 NOTE — Telephone Encounter (Signed)
 Copied from CRM #[id number]. Topic: Referral - Request for Referral >> Aug 16, 2024  3:12 PM Delon DASEN wrote: Did the patient discuss referral with their provider in the last year? No (If No - schedule appointment) (If Yes - send message)  Appointment offered? Yes  Type of order/referral and detailed reason for visit: neurology for dementia  Preference of office, provider, location: Dr Onita neurologist [facility]  If referral order, have you been seen by this specialty before? Yes (If Yes, this issue or another issue? When? Where?  Can we respond through MyChart? No

## 2024-08-16 NOTE — Progress Notes (Signed)
 [facility] [address] [facility] Liaison note:  This patient is currently enrolled in [facility] outpatient-based palliative care.  Hospital Liaison will continue to follow for discharge disposition.  Please call for any outpatient based palliative care related questions or concerns.  Thank you, Eleanor Nail, LPN [facility] Liaison [phone]

## 2024-08-16 NOTE — Discharge Instructions (Addendum)
 Please return for any worsening.  Follow-up with your family doctor and with the neurologist.

## 2024-08-16 NOTE — ED Notes (Signed)
 Patient and daughter refused X-ray.

## 2024-08-16 NOTE — ED Provider Notes (Signed)
 " [facility] AT [facility] Provider Note   CSN: [id number] Arrival date & time: 08/16/24  1317     Patient presents with: No chief complaint on file.   Teresa Riley is a 89 y.o. female.   89 yo F with a chief complaints of left-sided weakness and right-sided facial droop.  This was noticed when the family came to visit her this morning.  Last seen normal yesterday evening.  No obvious head injury.  Patient is demented at baseline and has trouble providing much history.  Most of the history is obtained by family member.  Noted that she was having some trouble speaking and right-sided facial droop.        Prior to Admission medications  Medication Sig Start Date End Date Taking? Authorizing Provider  acetaminophen  (TYLENOL ) 500 MG tablet Take 500-1,000 mg by mouth every 8 (eight) hours as needed for headache or fever (or pain).    [provider]  albuterol  (PROVENTIL ) (2.5 MG/3ML) 0.083% nebulizer solution Take 3 mLs (2.5 mg total) by nebulization every 6 (six) hours as needed for wheezing or shortness of breath. Patient not taking: Reported on 08/12/2024 03/29/21   Norleen Lynwood ORN, MD  albuterol  (VENTOLIN  HFA) 108 [phone] Base) MCG/ACT inhaler Inhale 2 puffs into the lungs every 4 (four) hours as needed for wheezing or shortness of breath. 09/07/23   Ula Prentice SAUNDERS, MD  ALPRAZolam  (XANAX ) 0.25 MG tablet TAKE 1 TABLET(0.25 MG) BY MOUTH TWICE DAILY AS NEEDED Patient taking differently: Take 0.125 mg by mouth at bedtime. 07/19/24   Norleen Lynwood ORN, MD  amLODipine  (NORVASC ) 10 MG tablet Take 1 tablet (10 mg total) by mouth daily. Patient taking differently: Take 10 mg by mouth at bedtime. 04/30/24   Norleen Lynwood ORN, MD  aspirin  EC 81 MG tablet Take 81 mg by mouth daily. Swallow whole.    [provider]  atorvastatin  (LIPITOR) 20 MG tablet Take 1 tablet (20 mg total) by mouth daily. Patient taking differently: Take 20 mg by mouth at bedtime. 04/30/24  04/30/25  Norleen Lynwood ORN, MD  citalopram  (CELEXA ) 20 MG tablet Take 1 tablet (20 mg total) by mouth daily. 04/30/24   Norleen Lynwood ORN, MD  hydrALAZINE  (APRESOLINE ) 50 MG tablet Take 1 tablet (50 mg total) by mouth 2 (two) times daily. Patient taking differently: Take 50 mg by mouth in the morning and at bedtime. 08/01/24   Norleen Lynwood ORN, MD  labetalol  (NORMODYNE ) 300 MG tablet TAKE 1 TABLET(300 MG) BY MOUTH TWICE DAILY Patient taking differently: Take 300 mg by mouth at bedtime. 04/30/24   Norleen Lynwood ORN, MD  lisinopril  (ZESTRIL ) 40 MG tablet TAKE 1 TABLET(40 MG) BY MOUTH DAILY Patient taking differently: Take 40 mg by mouth in the morning. 04/30/24   Norleen Lynwood ORN, MD  Memantine  HCl-Donepezil  HCl ER 28-10 MG CP24 Take 1 capsule by mouth every morning. 07/25/24   Norleen Lynwood ORN, MD  Polyethyl Glycol-Propyl Glycol 0.4-0.3 % SOLN Place 1 drop into both eyes 3 (three) times daily as needed (for dryness).    [provider]  QUEtiapine  (SEROQUEL ) 50 MG tablet Take 1 tablet (50 mg total) by mouth at bedtime. 04/30/24   Norleen Lynwood ORN, MD    Allergies: Patient has no known allergies.    Review of Systems  Updated Vital Signs BP (!) 159/82   Pulse 73   Temp 98.1 F (36.7 C) (Oral)   Resp (!) 21   Ht 5'  3 (1.6 m)   Wt 93 kg   SpO2 94%   BMI 36.32 kg/m   Physical Exam Vitals and nursing note reviewed.  Constitutional:      General: She is not in acute distress.    Appearance: She is well-developed. She is not diaphoretic.  HENT:     Head: Normocephalic and atraumatic.  Eyes:     Pupils: Pupils are equal, round, and reactive to light.  Cardiovascular:     Rate and Rhythm: Normal rate and regular rhythm.     Heart sounds: No murmur heard.    No friction rub. No gallop.  Pulmonary:     Effort: Pulmonary effort is normal.     Breath sounds: No wheezing or rales.  Abdominal:     General: There is no distension.     Palpations: Abdomen is soft.     Tenderness: There is no abdominal  tenderness.  Musculoskeletal:        General: No tenderness.     Cervical back: Normal range of motion and neck supple.  Skin:    General: Skin is warm and dry.  Neurological:     Mental Status: She is alert.     Comments: Right-sided facial droop left-sided weakness.  Patient is confused and has some trouble following commands.  Psychiatric:        Behavior: Behavior normal.     (all labs ordered are listed, but only abnormal results are displayed) Labs Reviewed  COMPREHENSIVE METABOLIC PANEL WITH GFR - Abnormal; Notable for the following components:      Result Value   Glucose, Bld 119 (*)    Creatinine, Ser 1.19 (*)    GFR, Estimated 44 (*)    All other components within normal limits  URINALYSIS, ROUTINE W REFLEX MICROSCOPIC - Abnormal; Notable for the following components:   APPearance HAZY (*)    Protein, ur 30 (*)    All other components within normal limits  TROPONIN T, HIGH SENSITIVITY - Abnormal; Notable for the following components:   Troponin T High Sensitivity 20 (*)    All other components within normal limits  TROPONIN T, HIGH SENSITIVITY - Abnormal; Notable for the following components:   Troponin T High Sensitivity 22 (*)    All other components within normal limits  CBC WITH DIFFERENTIAL/PLATELET  PROTIME-INR  LIPASE, BLOOD    EKG: EKG Interpretation Date/Time:  Thursday August 16 2024 15:00:24 EST Ventricular Rate:  73 PR Interval:  184 QRS Duration:  82 QT Interval:  438 QTC Calculation: 482 R Axis:   -9  Text Interpretation: Normal sinus rhythm Possible Anterior infarct , age undetermined Abnormal ECG Baseline wander TECHNICALLY DIFFICULT Otherwise no significant change Confirmed by Emil Share [phone]) on 08/16/2024 4:37:26 PM  Radiology: MR Cervical Spine W and Wo Contrast Result Date: 08/16/2024 EXAM: MRI CERVICAL SPINE WITH AND WITHOUT CONTRAST 08/16/2024 09:53:17 PM TECHNIQUE: Multiplanar multisequence MRI of the cervical spine was performed  without and with the administration of 9.5 mL gadobutrol  (GADAVIST ) 1 MMOL/ML injection. COMPARISON: None available. CLINICAL HISTORY: Cervical radiculopathy, infection suspected, no prior imaging. FINDINGS: LIMITATIONS/ARTIFACTS: Motion degraded study. BONES AND ALIGNMENT: Normal alignment. Normal vertebral body heights. There is heterogeneous bone marrow signal throughout the cervical spine, most notable on T1-weighted imaging. There is no disc space edema to indicate discitis. No abnormal enhancement. SPINAL CORD: Normal spinal cord size. Normal spinal cord signal. SOFT TISSUES: No significant abnormality. C2-C3: No disc herniation. No spinal canal stenosis. No foraminal narrowing. C3-C4:  Mild left facet hypertrophy with mild left foraminal stenosis. No disc herniation. No spinal canal stenosis. C4-C5: Moderate right facet hypertrophy with moderate right foraminal stenosis. No spinal canal stenosis. C5-C6: Small disc bulge with uncovertebral hypertrophy. Mild bilateral foraminal stenosis. No spinal canal stenosis. C6-C7: Minimal disc bulge. No central spinal canal or neural foraminal stenosis. C7-T1: No disc herniation. No spinal canal stenosis. No foraminal narrowing. IMPRESSION: 1. Motion degraded study without evidence of infection. 2. Diffusely heterogeneous bone marrow signal. This may be seen in chronic anemia, in the setting of smoking, or in marrow replacement processes such as multiple myeloma. 3. Moderate right foraminal stenosis at C4-C5 due to moderate right facet hypertrophy. 4. Mild left foraminal stenosis at C3-C4 due to mild left facet hypertrophy. 5. Mild bilateral foraminal stenosis at C5-C6 due to small disc bulge and uncovertebral hypertrophy. Electronically signed by: Franky Stanford MD 08/16/2024 10:23 PM EST RP Workstation: [id number]   CT ANGIO HEAD NECK W WO CM Result Date: 08/16/2024 EXAM: CTA HEAD AND NECK WITH AND WITHOUT 08/16/2024 06:56:00 PM TECHNIQUE: CTA of the head and neck was  performed with and without the administration of 75 mL iohexol  (OMNIPAQUE ) 350 MG/ML injection. Multiplanar 2D and/or 3D reformatted images are provided for review. Automated exposure control, iterative reconstruction, and/or weight based adjustment of the mA/kV was utilized to reduce the radiation dose to as low as reasonably achievable. Stenosis of the internal carotid arteries measured using NASCET criteria. COMPARISON: Same day MRI head and CT head. CLINICAL HISTORY: Neuro deficit, acute, stroke suspected. Acute neurologic deficit; stroke suspected. FINDINGS: CTA NECK: AORTIC ARCH AND ARCH VESSELS: Mild atherosclerosis of the visualized aortic arch. Atherosclerosis involves the origin of the left subclavian artery resulting in mild stenosis. There is common origin of the brachiocephalic and left common carotid arteries. No dissection or arterial injury. CERVICAL CAROTID ARTERIES: There is tortuosity of the proximal and mid right common carotid artery. Minimal atherosclerosis at the right carotid bifurcation without hemodynamically significant stenosis. There is mild tortuosity of the left common carotid artery. Minimal atherosclerosis at the left carotid bifurcation without hemodynamically significant stenosis. No dissection or arterial injury. CERVICAL VERTEBRAL ARTERIES: The vertebral arteries are patent from the origins to the vertebrobasilar confluence. There is atherosclerosis at the left vertebral artery origin resulting in mild stenosis. Tortuosity of the left V1 segment. Additional atherosclerosis involving the bilateral V4 segments which results in moderate stenosis on the right. No dissection or arterial injury. LUNGS AND MEDIASTINUM: Unremarkable. SOFT TISSUES: 1.1 cm hypoattenuating nodule in the right thyroid  lobe. BONES: Degenerative changes in the visualized spine. Disc space narrowing is greatest at C5-C6. Edentulous maxilla and mandible. There are multiple lucent foci noted within the lower  cervical and upper thoracic spine. 1 such lucency in the T3 vertebral body demonstrates series of trabecular thickening which may reflect a hemangioma. The other areas of lucency are indeterminate. Consider nonemergent MRI of the spine with and without contrast for further evaluation. CTA HEAD: ANTERIOR CIRCULATION: Atherosclerosis of the carotid siphons without high grade stenosis. No significant stenosis of the anterior cerebral arteries. No significant stenosis of the middle cerebral arteries. No aneurysm. POSTERIOR CIRCULATION: There is irregularity and mild stenosis of the P1 segment of the right PCA. Moderate to severe stenosis of the anterior P2 segment of the right PCA. There is relatively diminutive caliber of the right PCA branches likely related to prior infarct. There is irregularity and mild stenosis along the mid and distal basilar artery. No significant stenosis of the vertebral  arteries (intracranial segments). No aneurysm. OTHER: No dural venous sinus thrombosis on this non-dedicated study. IMPRESSION: 1. No large vessel occlusion or aneurysm in the head or neck. 2. Intracranial atherosclerotic disease with irregularity and mild stenosis of the P1 segment of the right PCA, moderate to severe stenosis of the anterior P2 segment of the right PCA. Irregularity and mild stenosis of the mid and distal basilar artery. 3. Moderate stenosis of the V4 segment of the right vertebral artery. 4. Multiple lucent foci in the lower cervical and upper thoracic spine, indeterminate. Recommendation for nonemergent MRI of the spine with and without contrast for further evaluation. Electronically signed by: Donnice Mania MD 08/16/2024 07:53 PM EST RP Workstation: [id number]   MR BRAIN WO CONTRAST Result Date: 08/16/2024 EXAM: MRI BRAIN WITHOUT CONTRAST 08/16/2024 06:30:14 PM TECHNIQUE: Multiplanar multisequence MRI of the head/brain was performed without the administration of intravenous contrast. COMPARISON: Same day  CT head and MRI head 11/18/2016. CLINICAL HISTORY: Neuro deficit, acute, stroke suspected. Acute neurological deficit; stroke suspected. FINDINGS: BRAIN AND VENTRICLES: No acute infarct. No intracranial hemorrhage. No mass. No midline shift. Marked ventriculomegaly out of proportion to the degree of sulcal enlargement. There is increased Evans index. Borderline callosal angle findings suggest normal pressure hydrocephalus. Overall the degree of ventriculomegaly is similar to the recent CT's and is increased when compared to the 2018 MRI. Extensive T2 and FLAIR hyperintensity throughout the periventricular and subcortical white matter with additional areas of signal abnormality in the pons as well as heterogeneous signal abnormality in the deep gray matter suggestive of extensive chronic microvascular ischemic changes. Encephalomalacia and gliosis in the medial right occipital lobe compatible with remote infarct. The sella is unremarkable. Normal flow voids. ORBITS: Bilateral lens replacement. SINUSES AND MASTOIDS: Mucosal thickening and mucous retention cyst in the left maxillary sinus. BONES AND SOFT TISSUES: Normal marrow signal. No soft tissue abnormality. IMPRESSION: 1. No acute findings. 2. Marked ventriculomegaly, similar to recent CTs and increased compared to the 2018 MRI. Findings suggestive of normal pressure hydrocephalus. 3. Advanced chronic microvascular ischemic changes. 4. Remote right occipital lobe infarct. Electronically signed by: Donnice Mania MD 08/16/2024 07:35 PM EST RP Workstation: [id number]   DG Chest Port 1 View Result Date: 08/16/2024 CLINICAL DATA:  Weakness. EXAM: PORTABLE CHEST 1 VIEW COMPARISON:  AP and lateral views 08/12/2024 FINDINGS: Lower lung volumes from prior exam. Cardiomegaly is stable. Unchanged mediastinal contours. Retrocardiac hiatal hernia. Aortic atherosclerosis. Mild hazy opacity at the lung bases. Mild central vascular congestion. No pneumothorax. IMPRESSION: 1.  Lower lung volumes from prior exam. Mild hazy opacity at the lung bases, may represent atelectasis, pleural effusions, airspace disease or combination there of. 2. Stable cardiomegaly. Mild central vascular congestion. Electronically Signed   By: Andrea Gasman M.D.   On: 08/16/2024 16:37   CT Head Wo Contrast Result Date: 08/16/2024 EXAM: CT HEAD WITHOUT CONTRAST 08/16/2024 02:20:00 PM TECHNIQUE: CT of the head was performed without the administration of intravenous contrast. Automated exposure control, iterative reconstruction, and/or weight based adjustment of the mA/kV was utilized to reduce the radiation dose to as low as reasonably achievable. COMPARISON: 08/12/2024 CLINICAL HISTORY: Right-sided weakness, unknown start. FINDINGS: BRAIN AND VENTRICLES: No acute hemorrhage. No evidence of acute infarct. Advanced atrophy and chronic small vessel ischemic change. Redemonstrated ventriculomegaly out of proportion to the degree of sulcal enlargement, borderline callosal angle, increased Evans index, findings concerning for normal pressure hydrocephalus in the appropriate clinical context. Remote right occipital infarct. No extra-axial collection. No mass effect or midline  shift. ORBITS: Bilateral lens resection. SINUSES: Mucous retention cyst left maxillary sinus. SOFT TISSUES AND SKULL: No acute soft tissue abnormality. No skull fracture. Atherosclerosis of skullbase vasculature without hyperdense vessel or abnormal calcification. IMPRESSION: 1. No acute intracranial abnormality. 2. Redemonstrated ventriculomegaly out of proportion to sulcal enlargement, concerning for normal pressure hydrocephalus. 3. Remote right occipital infarct. 4. Advanced atrophy and chronic small vessel ischemic change. Electronically signed by: Donnice Mania MD 08/16/2024 02:33 PM EST RP Workstation: [id number]     Procedures   Medications Ordered in the ED  acetaminophen  (TYLENOL ) tablet 500-1,000 mg (has no administration in  time range)  OLANZapine  zydis (ZYPREXA ) disintegrating tablet 5 mg (5 mg Oral Given 08/16/24 1731)  iohexol  (OMNIPAQUE ) 350 MG/ML injection 75 mL (75 mLs Intravenous Contrast Given 08/16/24 1848)  gadobutrol  (GADAVIST ) 1 MMOL/ML injection 9.5 mL (9.5 mLs Intravenous Contrast Given 08/16/24 2204)                                    Medical Decision Making Amount and/or Complexity of Data Reviewed Radiology: ordered.  Risk Prescription drug management.   89 yo F with a chief complaints of slurred speech right-sided facial droop and left-sided weakness.  This was noticed today about 1130.  Last seen normal last night.  Lives at a facility.  Has significant dementia.  Chest x-ray with no obvious focal left shoulder pneumothorax on my independent interpretation.  Radiology read with perhaps infection.  Family denies cough congestion fever.  Difficult to assess for LVO.  Does have some left upper extremity weakness.  Will obtain a CTA head and neck.  MRI of the brain and CTA head and neck without obvious acute finding.  I discussed this with neurology on-call Dr. Rockney.  He felt that if the MRI did not show an obvious cause of her left-sided weakness that she would benefit from a MRI of the C-spine.  I discussed this with the patient and family who are willing to have this performed.  MRI of the C-spine without obvious spinal cord compression.  The lesions seen on CT are concerning for possible multiple myeloma.  I discussed this with the patient and family.  Family tell me that the patient has had a bit of a decline recently.  Is currently in a rehab.  They wonder if this is worsening dementia.  Will give follow-up with neurology.  11:09 PM:  I have discussed the diagnosis/risks/treatment options with the patient and family.  Evaluation and diagnostic testing in the emergency department does not suggest an emergent condition requiring admission or immediate intervention beyond what has been  performed at this time.  They will follow up with PCP. We also discussed returning to the ED immediately if new or worsening sx occur. We discussed the sx which are most concerning (e.g., sudden worsening pain, fever, inability to tolerate by mouth) that necessitate immediate return. Medications administered to the patient during their visit and any new prescriptions provided to the patient are listed below.  Medications given during this visit Medications  acetaminophen  (TYLENOL ) tablet 500-1,000 mg (has no administration in time range)  OLANZapine  zydis (ZYPREXA ) disintegrating tablet 5 mg (5 mg Oral Given 08/16/24 1731)  iohexol  (OMNIPAQUE ) 350 MG/ML injection 75 mL (75 mLs Intravenous Contrast Given 08/16/24 1848)  gadobutrol  (GADAVIST ) 1 MMOL/ML injection 9.5 mL (9.5 mLs Intravenous Contrast Given 08/16/24 2204)     The patient appears reasonably screen and/or  stabilized for discharge and I doubt any other medical condition or other [facility] requiring further screening, evaluation, or treatment in the ED at this time prior to discharge.       Final diagnoses:  Weakness    ED Discharge Orders          Ordered    Ambulatory referral to Neurology       Comments: Progressive dementia, failure to thrive   08/16/24 2253               Emil Share, DO 08/16/24 2309  "

## 2024-08-16 NOTE — ED Provider Triage Note (Signed)
 Emergency Medicine Provider Triage Evaluation Note  Teresa Riley , a 89 y.o. female  was evaluated in triage.  Pt complains of weakness.  Sent from facility via EMS for weakness for the past 2 weeks.  Has dementia at baseline, patient has no complaints.  Facility noted a right sided facial droop that they are unsure when it started.  EMS states facial droop has resolved with them.  She does have some right sided weakness per EMS but negative stroke screen.  Review of Systems  Positive: Weakness Negative:   Physical Exam  BP (!) 142/74   Pulse 69   Temp 99.3 F (37.4 C) (Oral)   Resp 17   Ht 5' 3 (1.6 m)   Wt 93 kg   SpO2 94%   BMI 36.32 kg/m  Gen:   Awake, no distress   Resp:  Normal effort  MSK:   Moves extremities without difficulty  Other:  Patient appears to be leaning to the side and has some right sided facial droop but corrects normally with speaking and on stroke screen.  No acute neurologic deficits.  Equal handgrip bilaterally.  Medical Decision Making  Medically screening exam initiated at 1:52 PM.  Appropriate orders placed.  Teresa Riley was informed that the remainder of the evaluation will be completed by another provider, this initial triage assessment does not replace that evaluation, and the importance of remaining in the ED until their evaluation is complete.     Neysa Thersia RAMAN, [place] 08/16/24 1354

## 2024-08-16 NOTE — ED Notes (Signed)
 Patient transported to MRI

## 2024-08-16 NOTE — ED Notes (Signed)
 Pt placed on bedpan to attempt to urinate. Explained to patient that we will do an in/out catheter if she is unable to urinate.

## 2024-08-16 NOTE — ED Notes (Signed)
 Patient's legal guardian at bedside states patient is short of breath. SpO2 97% RA, respirations regular and unlabored. This RN asked why xray was refused prior. Legal guardian states there was a chest XR done a few days ago and there might not be a point. This RN educated patient family on importance of recent imaging in relation to current condition/complaint of patient. Legal guardian now agreeable to chest xr.

## 2024-08-16 NOTE — ED Notes (Signed)
 [facility] called

## 2024-08-16 NOTE — ED Triage Notes (Signed)
 Patient bib [facility] from [facility] with complaints of weakness for the past 2 weeks. She has dementia. Facility noticed right sided facial droop that resolved with EMS.  EMS reports right sided weakness but passed stroke screen.  LKN:11:30am

## 2024-08-17 NOTE — Telephone Encounter (Signed)
 Referral has already been placed yesterday by the ER.

## 2024-08-20 ENCOUNTER — Ambulatory Visit: Payer: Medicare HMO

## 2024-08-21 ENCOUNTER — Ambulatory Visit

## 2025-02-08 ENCOUNTER — Ambulatory Visit
